# Patient Record
Sex: Male | Born: 1935
Health system: Southern US, Community
[De-identification: ages and names within clinical notes are randomized; demographics above are authoritative.]

## PROBLEM LIST (undated history)

## (undated) DIAGNOSIS — N35919 Unspecified urethral stricture, male, unspecified site: Secondary | ICD-10-CM

## (undated) DIAGNOSIS — Z8673 Personal history of transient ischemic attack (TIA), and cerebral infarction without residual deficits: Secondary | ICD-10-CM

## (undated) DIAGNOSIS — I499 Cardiac arrhythmia, unspecified: Secondary | ICD-10-CM

## (undated) DIAGNOSIS — E785 Hyperlipidemia, unspecified: Secondary | ICD-10-CM

## (undated) DIAGNOSIS — F419 Anxiety disorder, unspecified: Secondary | ICD-10-CM

## (undated) DIAGNOSIS — Z8546 Personal history of malignant neoplasm of prostate: Secondary | ICD-10-CM

## (undated) DIAGNOSIS — Z85828 Personal history of other malignant neoplasm of skin: Secondary | ICD-10-CM

## (undated) DIAGNOSIS — M1990 Unspecified osteoarthritis, unspecified site: Secondary | ICD-10-CM

## (undated) DIAGNOSIS — N320 Bladder-neck obstruction: Secondary | ICD-10-CM

## (undated) DIAGNOSIS — I10 Essential (primary) hypertension: Secondary | ICD-10-CM

## (undated) DIAGNOSIS — H3530 Unspecified macular degeneration: Secondary | ICD-10-CM

## (undated) DIAGNOSIS — Z8739 Personal history of other diseases of the musculoskeletal system and connective tissue: Secondary | ICD-10-CM

## (undated) DIAGNOSIS — I4891 Unspecified atrial fibrillation: Secondary | ICD-10-CM

## (undated) DIAGNOSIS — R310 Gross hematuria: Secondary | ICD-10-CM

## (undated) DIAGNOSIS — R339 Retention of urine, unspecified: Secondary | ICD-10-CM

## (undated) HISTORY — DX: Unspecified atrial fibrillation: I48.91

## (undated) HISTORY — PX: TONSILLECTOMY: SUR1361

## (undated) HISTORY — PX: CATARACT EXTRACTION W/ INTRAOCULAR LENS  IMPLANT, BILATERAL: SHX1307

## (undated) HISTORY — PX: APPENDECTOMY: SHX54

---

## 1975-07-25 HISTORY — PX: CHOLECYSTECTOMY: SHX55

## 1996-07-24 HISTORY — PX: OTHER SURGICAL HISTORY: SHX169

## 2004-11-23 ENCOUNTER — Inpatient Hospital Stay (HOSPITAL_COMMUNITY): Admission: RE | Admit: 2004-11-23 | Discharge: 2004-11-25 | Payer: Self-pay | Admitting: Orthopedic Surgery

## 2004-11-23 HISTORY — PX: TOTAL KNEE ARTHROPLASTY: SHX125

## 2004-11-25 ENCOUNTER — Inpatient Hospital Stay (HOSPITAL_COMMUNITY)
Admission: RE | Admit: 2004-11-25 | Discharge: 2004-12-06 | Payer: Self-pay | Admitting: Physical Medicine & Rehabilitation

## 2004-11-25 ENCOUNTER — Ambulatory Visit: Payer: Self-pay | Admitting: Physical Medicine & Rehabilitation

## 2005-12-14 ENCOUNTER — Encounter: Admission: RE | Admit: 2005-12-14 | Discharge: 2005-12-14 | Payer: Self-pay | Admitting: Internal Medicine

## 2007-01-08 ENCOUNTER — Encounter: Admission: RE | Admit: 2007-01-08 | Discharge: 2007-01-08 | Payer: Self-pay | Admitting: Neurosurgery

## 2010-12-09 NOTE — Consult Note (Signed)
NAMEBARLOW, HARRISON               ACCOUNT NO.:  [id number]   MEDICAL RECORD NO.:  [id number]          PATIENT TYPE:  IPS   LOCATION:  4002                         FACILITY:  [facility]   PHYSICIAN:  James L. Malon Kindle., M.D.DATE OF BIRTH:  [birth year]   DATE OF CONSULTATION:  DATE OF DISCHARGE:                                   CONSULTATION   REFERRING PHYSICIAN:  Ellwood Dense, M.D.   REASON FOR CONSULTATION:  Dilated colon.   HISTORY OF PRESENT ILLNESS:  A very nice 77 year old gentleman, father of  Dr. Chipper Herb in Radiation Oncology, who was admitted and underwent  total right knee arthroplasty on Nov 24, 2004.  At the time of his admission  he had a potassium of around 3.0.  He got some additional potassium up to  3.7 on Nov 26, 2004 and it is down to 2.6 today despite some additional oral  potassium.  We were asked to see him regarding dilated colon and abdominal  bloating and discomfort.  No severe pain, fever, etc.  This was noted  yesterday, was no better today after one day of potassium replacement.  The  patient notes no history of previous problems with potassium.  Currently he  is receiving oral potassium and IV fluids without potassium.  His narcotics  have bene hold.  He is receiving an ice pack to his knee.   ADMISSION MEDICATIONS:  Allopurinol.  Lipitor.  Propranolol.  Hydrochlorothiazide.  Eye drops.   ALLERGIES:  No known drug allergies.   PAST MEDICAL HISTORY:  Hypertension.  He has macular degeneration, this is  one of his main problems.  He has a history of  high cholesterol and history  of syncope.  No known heart disease.  He had a spontaneous pneumothorax in  1954.  He also has had prostate cancer and has undergone seed implants.   PAST SURGICAL HISTORY:  Right total knee replacement.  Cholecystectomy.  Tonsillectomy.  Prostate seed implants.  Several MOHS procedures for  squamous cell carcinoma of the skin.   FAMILY HISTORY:  Mother died of heart  disease due to rheumatic fever at 62.  Father died of a stroke in the 75s.  There is no family history of any GI  cancer.   SOCIAL HISTORY:  He is retired from the [employer].  He is a nonsmoker.  Drinks wine daily.  Has five children.  Lives here in town here with his  wife.   REVIEW OF SYSTEMS:  Dr. Earl Gala his primary care doctor.  Checks stool  hemoccult yearly which have been negative.  He also has regular sigmoids  every 3-5 years.  He thinks he had a last one about three years ago.  He has  occasional constipations.  Only taking something to go 2-3 times in his life  and occasional suppository, this is not a major problem for him.  He has  never seen any blood in his stool.   PHYSICAL EXAMINATION:  GENERAL:  A very pleasant, oriented and alert white  male in no acute distress.  HEENT:  Eyes:  Sclerae nonicteric.  NECK:  Supple.  LUNGS:  Clear.  HEART:  Regular rate and rhythm without murmurs or gallops.  ABDOMEN:  Distended and tympanic but soft and nontender, no rebound or  guarding.  The bowel sounds are present and at times actually sound normal  but clearly present, they are not high-pitched.  RECTAL:  No stool at all in the rectal vault.  The patient had previously  had an enema yesterday.  The mucus was sent for hemoccult.   ASSESSMENT:  Profound colonic ileus probably due to low potassium.  There is  no stool in the rectal vault.  He is not impacted.  He had an enema.  Has  regular sigmoids.  On the KUB it appears that he has air down to at least  the level of the distal sigmoid proximal rectum.  Hopefully this will  improve with steady replacement of his potassium.   PLAN:  Agree with replacing his potassium.  He is getting 40 mEq q.i.d.  orally right now.  Allow him small amounts of clear liquids until he is  doing better.  Will add some potassium to his IV.  In the interim we will  place a rectal tube and hook it up to low suction to see if this will help   decompress his colon some with plans to remove this as soon as he is  decompressed and passing air.      JLE/MEDQ  D:  11/29/2004  T:  11/29/2004  Job:  [id number]   cc:   Ellwood Dense, M.D.  [address] [place]  [place] [address]  Fax: [phone]   Dr. Merrilee Seashore(?)   Theressa Millard, M.D.  [address] [place]  [place] [address]  Fax: [phone]

## 2010-12-09 NOTE — Op Note (Signed)
[name], Potter               ACCOUNT NO.:  [id number]   MEDICAL RECORD NO.:  [id number]          PATIENT TYPE:  INP   LOCATION:  0003                         FACILITY:  [facility]   PHYSICIAN:  Ollen Gross, M.D.    DATE OF BIRTH:  01-10-1926   DATE OF PROCEDURE:  11/23/2004  DATE OF DISCHARGE:                                 OPERATIVE REPORT   PREOPERATIVE DIAGNOSIS:  Osteoarthritis, right knee.   POSTOPERATIVE DIAGNOSES:  Osteoarthritis, right knee.   PROCEDURE:  Right total knee arthroplasty, computer navigated.   SURGEON:  Dr. Despina Hick.   ASSISTANT:  Avel Peace.   ANESTHESIA:  Spinal.   ESTIMATED BLOOD LOSS:  Minimal.   DRAINS:  Hemovac x1.   TOURNIQUET TIME:  61 minutes at 300 mmHg.   COMPLICATIONS:  None.   CONDITION:  Stable to recovery.   BRIEF CLINICAL NOTE:  [name] is a 77 year old male with severe end-stage  arthritis of the right knee with a large varus deformity and significant  pain. He has failed nonoperative management and presents now for total knee  arthroplasty.   PROCEDURE IN DETAIL:  After successful administration of spinal anesthetic,  a tourniquet was placed high on the right thigh and right lower extremity  prepped and draped in usual sterile fashion. Extremity was wrapped in  Esmarch, knee flexed and tourniquet inflated to 300 mmHg. Standard midline  incision was made with 10 blade through the subcutaneous tissue to the level  of the extensor mechanism where a fresh blade was used to make a medial  parapatellar arthrotomy. Soft tissue over the proximal medial tibia  subperiosteally elevated through the joint line with the knife into the  membranous bursa with a Cobb elevator. Soft tissue of the proximal lateral  tibia is also elevated with attention being paid to avoid the patellar  tendon on tibial tubercle. Patella was everted, knee flexed to 90 degrees,  and ACL and PCL removed. The Shantz screws were then placed, two in the  tibia  and two in the femur for placement of the computerized arrays.  Anatomic data is then inputted into the computer and femoral and tibial  models were generated. The alignment demonstrates 9 degrees of varus with a  9-degree flexion contracture.   Knee was again flexed 90 degrees, and the distal femoral cutting guide was  placed. Under computer guidance, we sent it for neutral varus-valgus,  neutral flexion/extension, and to remove 10 mm of the distal femur. Distal  femoral resection is made with an oscillating saw. We confirmed with the  computer that the cup was made as planned. The rotational block is then  placed to reference off the epicondylar axis. Size 5 was the computer  generated size which confirmed with intraoperative measurement. The size 5  cutting block was then placed, and then anterior, posterior, and chamfer  cuts were made.   Tibia was subluxed forward, and the remainder of the menisci are removed.  The tibial cutting block was placed under computer guidance to remove  approximately 10 mm off the nondeficient lateral side. The block is pinned  in 2 degrees of flexion with neutral varus-valgus. The resection was then  made with an oscillating saw and confirmed to be made as planned. Size 4 is  most appropriate tibial component, and the proximal tibia was then prepared  with the modular drill and keel punch for a size 4. Femoral preparation was  completed with the intercondylar cut for the size 5.   Size 4 mobile bearing tibial trial with a size 5 posterior stabilized  femoral trial and the 10-mm posterior stabilized rotating platform insert  trial were placed. With the 10, full extension was achieved with excellent  varus and valgus balance throughout full range of motion. The patella was  then everted and thickness measured to be 26 mm. Freehand resection taken to  14 mm, 41 template was placed, lug holes were drilled, trial patella was  placed, and it tracks normally.  Osteophytes were then removed off the  posterior femur with the trial in place. All trials were removed, and the  cut bone surfaces were prepared with pulsatile lavage. Cement was mixed and  once ready for reimplantation, a size 4 mobile bearing tibial tray, size 5  posterior stabilized femur, and 41 patella are cemented into place, and the  patella was held the clamp. Trial 10-mm inserts were placed and knee held in  full extension, and all extruded cement removed. Once the cement fully  hardened, then the permanent 10-mm posterior stabilized rotating platform  insert is placed in the tibial tray. Wound was copiously irrigated with  saline solution, and the extensor mechanism closed over Hemovac drain with  interrupted #1 PDS. Flexion against gravity was 140 degrees. The tourniquet  had been released with total time of 61 minutes. Subcu was closed with  interrupted 2-0 Vicryl subcuticular running 4-0 Monocryl. Incisions were  cleaned and dried, and Steri-Strips and bulky sterile dressing applied.  Please note that prior to removing the computerized pins with the trials in  place, we had achieved full extension and were less than 1 degree from being  completely neutral in the varus-valgus plane. At completion of procedure, he  was placed into a knee immobilizer, awakened and transferred to recovery in  stable condition.      FA/MEDQ  D:  11/23/2004  T:  11/23/2004  Job:  [id number]

## 2010-12-09 NOTE — Discharge Summary (Signed)
Justin Potter, Justin Potter NO.:  [id number]   MEDICAL RECORD NO.:  [id number]          PATIENT TYPE:  IPS   LOCATION:  4007                         FACILITY:  [facility]   PHYSICIAN:  Ellwood Dense, M.D.   DATE OF BIRTH:  1925/12/03   DATE OF ADMISSION:  11/25/2004  DATE OF DISCHARGE:  12/06/2004                                 DISCHARGE SUMMARY   DISCHARGE DIAGNOSES:  1.  Right total knee replacement for osteoarthritis.  2.  Postoperative ileus, resolved.  3.  Hypokalemia, much improved.  4.  Hypertension, stable.   HISTORY OF PRESENT ILLNESS:  Mr. Mcfarren is a 77 year old male with history  of hypertension and right knee pain secondary to end-stage OA.  He elected  to undergo right total knee replacement Nov 23, 2004 by Dr. Lequita Halt.  Postop  he is weightbearing as tolerated and on Coumadin for DVT prophylaxis.  INR  subtherapeutic currently.  Patient also noted to be hypokalemic with  potassium at 3 at admission currently being supplemented with a rise to 3.1.  Therapy is initiated and patient noted to be progressing along slowly  __________ .   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   ALLERGIES:  No known drug allergies.   FAMILY HISTORY:  Positive for coronary artery disease and CVA.   SOCIAL HISTORY:  Patient lives with wife in [place] with one level with two  steps at entry, was independent prior to admission, he drinks two glasses of  wine a day, quit tobacco in 1975.   HOSPITAL COURSE:  Mr. Paco Cislo was admitted to rehab on Nov 25, 2004 for  inpatient therapies to consist of PT/OT daily.  Past admission patient was  continued on Coumadin for DVT prophylaxis, subcu Lovenox was added  until  Coumadin at therapeutic basis.  Patient was maintained on potassium  supplement on b.i.d. basis.  He did report problems with pain control and  OxyContin CR was added to help with more consistent pain relief.  As patient  reported no BM since surgery he was started on Senokot-S at bedtime.  Patient did have some diarrhea over the weekend on May 6, May 7 and stool  was checked for Clostridium differential.  On May 8 patient reported  complaints of continued diarrhea - question constipation versus early ileus.  A KUB was checked showing patient with moderate diffuse colonic ileus.  Patient was also noted to be hypokalemic which was contributing to his  hypokalemia as potassium on May 8 at 2.7.  Patient was started on potassium  supplement and his hydrochlorothiazide was decreased to once a day.  Followup KUB done on May 9 showed continued abdominal distention, mainly  colonic, with air throughout small bowel.  GI was consulted with question of  rectal tube versus NG placement.  Dr. Randa Evens evaluated patient and felt  that  patient's ileus was mostly due to hypokalemia, rectal tube was placed  to low suction on Nov 29, 2004, this was discontinued on Nov 30, 2004 as x-  rays revealed less abdominal distention.  Patient's diet was slowly advanced  to regular on Dec 01, 2004.  Of issue during this stay has been patient's  continued hypokalemia, it did improve slightly to 3 however, on Dec 02, 2004  it was noted to drop down to 2.7.  Patient's hydrochlorothiazide was  discontinued completely and patient was treated with runs of potassium to  help supplement his hypokalemia.  On Dec 06, 2004 patient's potassium was  noted to be improving at 3.3.  As potassium was on upward trend patient was  discharged to home with 40 mEq b.i.d. with repeat potassium level to be  checked on Dec 06, 2004 with the results to Dr. Earl Gala.   By time of discharge patient's diarrhea had resolved.  Patient was able  to  tolerate regular diet without difficulty.  He knee incision was noted to be  healing well without any signs or symptoms of infection.  He had been taken  off narcotics as of Nov 28, 2004 and pain was reasonably controlled with  p.r.n. use of Ultram.  Patient's blood pressure  monitored on b.i.d. basis  has shown good control on Inderal b.i.d. alone.  At time of discharge blood  pressures ranging at 130s-145 systolic's/70s-80s diastolic's.  Check of  lytes on Dec 06, 2004 reveal sodium 141, potassium 3.2, chloride 109, CO2  24, BUN 12, creatinine 0.8, glucose 112.  Patient did make good progress  during his stay in rehab.  By time of discharge he was modified independent  for transfers, modified independent for ambulating greater than 200 feet  with a rolling walker, he was at modified independent level for upper body  care, required setup, with occasionally min assist for lower body dressing.  Further followup therapies to include home health PT and RN by [facility].  On Dec 06, 2004 patient is discharged to home.   DISCHARGE MEDICATIONS:  1.  Inderal 40 mg b.i.d.  2.  Oxycodone IR one p.o. per day p.r.n. severe pain.  3.  Ultram 50 mg q.i.d. p.r.n. pain.  4.  Coumadin 2 mg two p.o. every evening.  5.  K-Dur 40 mEq b.i.d.   DIET:  Regular.   ACTIVITY:  As tolerated, use a walker.   SPECIAL INSTRUCTIONS:  No alcohol, no smoking, no driving, wash incision  with soap and water keep clean and dry.  Home health RN to draw prothrombin  time next on Dec 07, 2004 with results to Dr. Earl Gala.  Coumadin to continue  to June 4.   FOLLOW UP:  Patient to follow up with Dr. Lequita Halt in 2 weeks, follow up with  Dr. Earl Gala for routine check, follow up with Dr. Lamar Benes as needed.      PP/MEDQ  D:  12/07/2004  T:  12/07/2004  Job:  [id number]   cc:   Ollen Gross, M.D.  [facility]  [address]  [address]  [place] [address]  Fax: [phone]   Dr.  Barbra Sarks, M.D.  [address] [place]  [place] [address]  Fax: [phone]

## 2010-12-09 NOTE — H&P (Signed)
NAME:  Justin Potter, Justin Potter          ACCOUNT NO.:  [id number]   MEDICAL RECORD NO.:  [id number]          PATIENT TYPE:  INP   LOCATION:  0477                         FACILITY:  [facility]   PHYSICIAN:  Ollen Gross, M.D.    DATE OF BIRTH:  12/07/1925   DATE OF ADMISSION:  11/23/2004  DATE OF DISCHARGE:  11/25/2004                                HISTORY & PHYSICAL   DATE OF OFFICE VISIT AND HISTORY AND PHYSICAL:  November 15, 2004   CHIEF COMPLAINT:  Right knee pain.   HISTORY OF PRESENT ILLNESS:  Patient is a 77 year old male seen by Dr. Homero Fellers  Aluisio for ongoing right knee pain.  He is the father of Dr. Chipper Herb  in radiation oncology.  He was seen for progressive right knee pain, it has  been ongoing for about 6 or 7 years now but getting much worse.  He has seen  Dr. Jerl Santos in the past and recommended holding off on surgery as long as  possible.  He has undergone injections which help temporarily including  Synvisc.  He had a friend that underwent unicompartmental replacement and  was interested in that but unfortunately his symptoms have progressed and  his x-rays show that he has moderate change of 8- to 10-degree varus  deformity on the right with bone-on-bone change in the medial compartment  and also patellofemoral changes and it is felt he has reached a point where  he can benefit from undergoing a total knee replacement.  Risks and benefits  have been discussed and he has elected to proceed with surgery.   ALLERGIES:  No known drug allergies.   CURRENT MEDICATIONS:  Allopurinol, Lipitor, propranolol/hydrochlorothiazide.   PAST MEDICAL HISTORY:  Hypertension, history of syncopal episode 2001,  macular degeneration, spontaneous pneumothorax 1954, elevated cholesterol,  prostate cancer, degenerative arthritis.   PAST SURGICAL HISTORY:  Tonsillectomy, cholecystectomy, prostate seed  implantation, Mohs surgery on ear and lip, multiple dermatologic procedures  of excisions  of squamous cell carcinoma, he has also undergone multiple  treatments for macular degeneration.   SOCIAL HISTORY:  Married, retired from the [employer], nonsmoker, one to  two glasses of wine daily, has five children, his wife will be assisting  with care after surgery.   FAMILY HISTORY:  Mother deceased heart disease, father deceased history of  stroke.   REVIEW OF SYSTEMS:  GENERAL:  No fever, chills, night sweats.  NEUROLOGIC:  Syncopal episode back in 2001, no subsequent problems, no recent syncope,  seizures or paralysis.  RESPIRATORY:  No shortness of breath, productive  cough, or hemoptysis.  CARDIOVASCULAR:  No chest pain, angina, orthopnea.  GI:  No nausea, vomiting, diarrhea, constipation.  GU:  Has somewhat of a  weak stream otherwise no dysuria, hematuria or discharge.  MUSCULOSKELETAL:  Right knee found in the history of present illness.   PHYSICAL EXAMINATION:  VITAL SIGNS:  Pulse 56, respirations 12, blood  pressure 148/82.  GENERAL:  [age]-year-old white male well nourished, well developed,  tall frame, he is alert, oriented, cooperative, pleasant at time of exam,  appears to be an excellent historian.  HEENT:  Normocephalic, atraumatic.  Pupils round and reactive.  EOMs are  intact.  He has had lip reconstructive surgery.  Patient does have upper  dentures.  NECK:  Supple.  No carotid bruits.  CHEST:  Clear anterior posterior chest walls.  No rhonchi, rales, or  wheezing.  HEART:  Regular rate and rhythm with an early systolic ejection murmur 2/6  best heard at the aortic point.  ABDOMEN:  Soft, slightly round, nontender, bowel sounds are present.  RECTAL/BREASTS/GENITALIA:  Not done not pertinent to present illness.  EXTREMITIES/RIGHT KNEE:  Right knee does show a significant varus deformity  with range of motion 5-115 degrees, marked crepitus is noted, no effusion,  no instability.   IMPRESSION:  1.  Osteoarthritis right knee.  2.  Hypertension.   3.  History of prostate cancer.  4.  Syncopal episode 2001.  5.  Hypercholesterolemia.  6.  History of spontaneous pneumothorax 1954.   PLAN:  Patient admitted to [facility] to undergo a right total  knee arthroplasty.  Surgery will be performed by Dr. Ollen Gross.      ALP/MEDQ  D:  11/27/2004  T:  11/27/2004  Job:  [id number]   cc:   Theressa Millard, M.D.  [address] [place]  [place] [address]  Fax: [phone]

## 2011-07-26 DIAGNOSIS — H35359 Cystoid macular degeneration, unspecified eye: Secondary | ICD-10-CM | POA: Diagnosis not present

## 2011-07-26 DIAGNOSIS — H353190 Nonexudative age-related macular degeneration, unspecified eye, stage unspecified: Secondary | ICD-10-CM | POA: Diagnosis not present

## 2011-07-26 DIAGNOSIS — H353290 Exudative age-related macular degeneration, unspecified eye, stage unspecified: Secondary | ICD-10-CM | POA: Diagnosis not present

## 2011-08-09 DIAGNOSIS — E7800 Pure hypercholesterolemia, unspecified: Secondary | ICD-10-CM | POA: Diagnosis not present

## 2011-08-09 DIAGNOSIS — R42 Dizziness and giddiness: Secondary | ICD-10-CM | POA: Diagnosis not present

## 2011-08-09 DIAGNOSIS — Z1331 Encounter for screening for depression: Secondary | ICD-10-CM | POA: Diagnosis not present

## 2011-08-09 DIAGNOSIS — I10 Essential (primary) hypertension: Secondary | ICD-10-CM | POA: Diagnosis not present

## 2011-08-30 DIAGNOSIS — H35059 Retinal neovascularization, unspecified, unspecified eye: Secondary | ICD-10-CM | POA: Diagnosis not present

## 2011-08-30 DIAGNOSIS — H353290 Exudative age-related macular degeneration, unspecified eye, stage unspecified: Secondary | ICD-10-CM | POA: Diagnosis not present

## 2011-11-01 DIAGNOSIS — L821 Other seborrheic keratosis: Secondary | ICD-10-CM | POA: Diagnosis not present

## 2011-11-01 DIAGNOSIS — C44711 Basal cell carcinoma of skin of unspecified lower limb, including hip: Secondary | ICD-10-CM | POA: Diagnosis not present

## 2011-11-01 DIAGNOSIS — D044 Carcinoma in situ of skin of scalp and neck: Secondary | ICD-10-CM | POA: Diagnosis not present

## 2011-11-01 DIAGNOSIS — D0439 Carcinoma in situ of skin of other parts of face: Secondary | ICD-10-CM | POA: Diagnosis not present

## 2011-11-01 DIAGNOSIS — L570 Actinic keratosis: Secondary | ICD-10-CM | POA: Diagnosis not present

## 2011-11-01 DIAGNOSIS — D0470 Carcinoma in situ of skin of unspecified lower limb, including hip: Secondary | ICD-10-CM | POA: Diagnosis not present

## 2011-11-01 DIAGNOSIS — C4442 Squamous cell carcinoma of skin of scalp and neck: Secondary | ICD-10-CM | POA: Diagnosis not present

## 2011-11-01 DIAGNOSIS — C44320 Squamous cell carcinoma of skin of unspecified parts of face: Secondary | ICD-10-CM | POA: Diagnosis not present

## 2011-11-01 DIAGNOSIS — D0430 Carcinoma in situ of skin of unspecified part of face: Secondary | ICD-10-CM | POA: Diagnosis not present

## 2011-11-01 DIAGNOSIS — C44519 Basal cell carcinoma of skin of other part of trunk: Secondary | ICD-10-CM | POA: Diagnosis not present

## 2011-11-02 DIAGNOSIS — H35359 Cystoid macular degeneration, unspecified eye: Secondary | ICD-10-CM | POA: Diagnosis not present

## 2011-11-02 DIAGNOSIS — H35059 Retinal neovascularization, unspecified, unspecified eye: Secondary | ICD-10-CM | POA: Diagnosis not present

## 2011-11-02 DIAGNOSIS — H353290 Exudative age-related macular degeneration, unspecified eye, stage unspecified: Secondary | ICD-10-CM | POA: Diagnosis not present

## 2011-12-01 DIAGNOSIS — Z961 Presence of intraocular lens: Secondary | ICD-10-CM | POA: Diagnosis not present

## 2011-12-01 DIAGNOSIS — H3530 Unspecified macular degeneration: Secondary | ICD-10-CM | POA: Diagnosis not present

## 2011-12-01 DIAGNOSIS — H52209 Unspecified astigmatism, unspecified eye: Secondary | ICD-10-CM | POA: Diagnosis not present

## 2011-12-25 DIAGNOSIS — R05 Cough: Secondary | ICD-10-CM | POA: Diagnosis not present

## 2011-12-25 DIAGNOSIS — R059 Cough, unspecified: Secondary | ICD-10-CM | POA: Diagnosis not present

## 2012-01-08 DIAGNOSIS — H35059 Retinal neovascularization, unspecified, unspecified eye: Secondary | ICD-10-CM | POA: Diagnosis not present

## 2012-01-08 DIAGNOSIS — H353290 Exudative age-related macular degeneration, unspecified eye, stage unspecified: Secondary | ICD-10-CM | POA: Diagnosis not present

## 2012-02-06 DIAGNOSIS — E7800 Pure hypercholesterolemia, unspecified: Secondary | ICD-10-CM | POA: Diagnosis not present

## 2012-02-06 DIAGNOSIS — I10 Essential (primary) hypertension: Secondary | ICD-10-CM | POA: Diagnosis not present

## 2012-03-20 DIAGNOSIS — H353290 Exudative age-related macular degeneration, unspecified eye, stage unspecified: Secondary | ICD-10-CM | POA: Diagnosis not present

## 2012-03-20 DIAGNOSIS — H35059 Retinal neovascularization, unspecified, unspecified eye: Secondary | ICD-10-CM | POA: Diagnosis not present

## 2012-04-23 DIAGNOSIS — Z23 Encounter for immunization: Secondary | ICD-10-CM | POA: Diagnosis not present

## 2012-05-01 DIAGNOSIS — L821 Other seborrheic keratosis: Secondary | ICD-10-CM | POA: Diagnosis not present

## 2012-05-01 DIAGNOSIS — Z85820 Personal history of malignant melanoma of skin: Secondary | ICD-10-CM | POA: Diagnosis not present

## 2012-05-01 DIAGNOSIS — L723 Sebaceous cyst: Secondary | ICD-10-CM | POA: Diagnosis not present

## 2012-05-01 DIAGNOSIS — D485 Neoplasm of uncertain behavior of skin: Secondary | ICD-10-CM | POA: Diagnosis not present

## 2012-05-01 DIAGNOSIS — L570 Actinic keratosis: Secondary | ICD-10-CM | POA: Diagnosis not present

## 2012-05-20 DIAGNOSIS — H353290 Exudative age-related macular degeneration, unspecified eye, stage unspecified: Secondary | ICD-10-CM | POA: Diagnosis not present

## 2012-05-23 DIAGNOSIS — N320 Bladder-neck obstruction: Secondary | ICD-10-CM | POA: Diagnosis not present

## 2012-05-23 DIAGNOSIS — N3942 Incontinence without sensory awareness: Secondary | ICD-10-CM | POA: Diagnosis not present

## 2012-05-23 DIAGNOSIS — Z8546 Personal history of malignant neoplasm of prostate: Secondary | ICD-10-CM | POA: Diagnosis not present

## 2012-07-22 DIAGNOSIS — H353290 Exudative age-related macular degeneration, unspecified eye, stage unspecified: Secondary | ICD-10-CM | POA: Diagnosis not present

## 2012-07-22 DIAGNOSIS — H35059 Retinal neovascularization, unspecified, unspecified eye: Secondary | ICD-10-CM | POA: Diagnosis not present

## 2012-09-02 DIAGNOSIS — H35059 Retinal neovascularization, unspecified, unspecified eye: Secondary | ICD-10-CM | POA: Diagnosis not present

## 2012-09-02 DIAGNOSIS — H353290 Exudative age-related macular degeneration, unspecified eye, stage unspecified: Secondary | ICD-10-CM | POA: Diagnosis not present

## 2012-09-03 ENCOUNTER — Other Ambulatory Visit: Payer: Self-pay | Admitting: Internal Medicine

## 2012-09-03 DIAGNOSIS — C61 Malignant neoplasm of prostate: Secondary | ICD-10-CM | POA: Diagnosis not present

## 2012-09-03 DIAGNOSIS — R350 Frequency of micturition: Secondary | ICD-10-CM | POA: Diagnosis not present

## 2012-09-03 DIAGNOSIS — E7800 Pure hypercholesterolemia, unspecified: Secondary | ICD-10-CM | POA: Diagnosis not present

## 2012-09-03 DIAGNOSIS — Z0000 Encounter for general adult medical examination without abnormal findings: Secondary | ICD-10-CM | POA: Diagnosis not present

## 2012-09-03 DIAGNOSIS — I10 Essential (primary) hypertension: Secondary | ICD-10-CM | POA: Diagnosis not present

## 2012-09-03 DIAGNOSIS — G459 Transient cerebral ischemic attack, unspecified: Secondary | ICD-10-CM

## 2012-09-03 DIAGNOSIS — Z1331 Encounter for screening for depression: Secondary | ICD-10-CM | POA: Diagnosis not present

## 2012-09-03 DIAGNOSIS — M109 Gout, unspecified: Secondary | ICD-10-CM | POA: Diagnosis not present

## 2012-09-09 ENCOUNTER — Ambulatory Visit
Admission: RE | Admit: 2012-09-09 | Discharge: 2012-09-09 | Disposition: A | Discharge status: Home / Self Care | Payer: Medicare Other | Source: Ambulatory Visit | Attending: Internal Medicine | Admitting: Internal Medicine

## 2012-09-09 DIAGNOSIS — G459 Transient cerebral ischemic attack, unspecified: Secondary | ICD-10-CM | POA: Diagnosis not present

## 2012-10-16 DIAGNOSIS — H353290 Exudative age-related macular degeneration, unspecified eye, stage unspecified: Secondary | ICD-10-CM | POA: Diagnosis not present

## 2012-10-16 DIAGNOSIS — H35059 Retinal neovascularization, unspecified, unspecified eye: Secondary | ICD-10-CM | POA: Diagnosis not present

## 2012-10-16 DIAGNOSIS — H35359 Cystoid macular degeneration, unspecified eye: Secondary | ICD-10-CM | POA: Diagnosis not present

## 2012-10-25 DIAGNOSIS — G459 Transient cerebral ischemic attack, unspecified: Secondary | ICD-10-CM | POA: Diagnosis not present

## 2012-10-30 DIAGNOSIS — Z85820 Personal history of malignant melanoma of skin: Secondary | ICD-10-CM | POA: Diagnosis not present

## 2012-10-30 DIAGNOSIS — L570 Actinic keratosis: Secondary | ICD-10-CM | POA: Diagnosis not present

## 2012-10-30 DIAGNOSIS — D0470 Carcinoma in situ of skin of unspecified lower limb, including hip: Secondary | ICD-10-CM | POA: Diagnosis not present

## 2012-10-30 DIAGNOSIS — L84 Corns and callosities: Secondary | ICD-10-CM | POA: Diagnosis not present

## 2012-10-30 DIAGNOSIS — C4441 Basal cell carcinoma of skin of scalp and neck: Secondary | ICD-10-CM | POA: Diagnosis not present

## 2012-10-30 DIAGNOSIS — C44319 Basal cell carcinoma of skin of other parts of face: Secondary | ICD-10-CM | POA: Diagnosis not present

## 2012-10-30 DIAGNOSIS — D485 Neoplasm of uncertain behavior of skin: Secondary | ICD-10-CM | POA: Diagnosis not present

## 2012-11-25 DIAGNOSIS — H353290 Exudative age-related macular degeneration, unspecified eye, stage unspecified: Secondary | ICD-10-CM | POA: Diagnosis not present

## 2012-11-25 DIAGNOSIS — H35359 Cystoid macular degeneration, unspecified eye: Secondary | ICD-10-CM | POA: Diagnosis not present

## 2012-12-02 DIAGNOSIS — T148XXA Other injury of unspecified body region, initial encounter: Secondary | ICD-10-CM | POA: Diagnosis not present

## 2012-12-06 DIAGNOSIS — H3530 Unspecified macular degeneration: Secondary | ICD-10-CM | POA: Diagnosis not present

## 2012-12-06 DIAGNOSIS — H52209 Unspecified astigmatism, unspecified eye: Secondary | ICD-10-CM | POA: Diagnosis not present

## 2012-12-09 DIAGNOSIS — M25519 Pain in unspecified shoulder: Secondary | ICD-10-CM | POA: Diagnosis not present

## 2012-12-09 DIAGNOSIS — M542 Cervicalgia: Secondary | ICD-10-CM | POA: Diagnosis not present

## 2012-12-25 ENCOUNTER — Encounter (HOSPITAL_COMMUNITY): Payer: Self-pay | Admitting: Emergency Medicine

## 2012-12-25 ENCOUNTER — Emergency Department (HOSPITAL_COMMUNITY)
Admission: EM | Admit: 2012-12-25 | Discharge: 2012-12-25 | Disposition: A | Discharge status: Home / Self Care | Payer: Medicare Other | Attending: Emergency Medicine | Admitting: Emergency Medicine

## 2012-12-25 DIAGNOSIS — N35919 Unspecified urethral stricture, male, unspecified site: Secondary | ICD-10-CM | POA: Diagnosis not present

## 2012-12-25 DIAGNOSIS — R319 Hematuria, unspecified: Secondary | ICD-10-CM | POA: Diagnosis not present

## 2012-12-25 DIAGNOSIS — Z8546 Personal history of malignant neoplasm of prostate: Secondary | ICD-10-CM | POA: Diagnosis not present

## 2012-12-25 DIAGNOSIS — R339 Retention of urine, unspecified: Secondary | ICD-10-CM | POA: Insufficient documentation

## 2012-12-25 DIAGNOSIS — Z79899 Other long term (current) drug therapy: Secondary | ICD-10-CM | POA: Diagnosis not present

## 2012-12-25 DIAGNOSIS — R310 Gross hematuria: Secondary | ICD-10-CM | POA: Diagnosis not present

## 2012-12-25 LAB — URINALYSIS, ROUTINE W REFLEX MICROSCOPIC
Ketones, ur: NEGATIVE mg/dL
Nitrite: NEGATIVE
Protein, ur: 100 mg/dL — AB

## 2012-12-25 LAB — BASIC METABOLIC PANEL
BUN: 18 mg/dL (ref 6–23)
CO2: 25 mEq/L (ref 19–32)
Calcium: 9 mg/dL (ref 8.4–10.5)
Chloride: 103 mEq/L (ref 96–112)
Creatinine, Ser: 1 mg/dL (ref 0.50–1.35)
GFR calc non Af Amer: 67 mL/min — ABNORMAL LOW (ref >90)
Glucose, Bld: 111 mg/dL — ABNORMAL HIGH (ref 70–99)
Potassium: 3.7 mEq/L (ref 3.5–5.1)

## 2012-12-25 LAB — CBC WITH DIFFERENTIAL/PLATELET
Basophils Absolute: 0.1 10*3/uL (ref 0.0–0.1)
Basophils Relative: 1 % (ref 0–1)
Eosinophils Absolute: 0.2 10*3/uL (ref 0.0–0.7)
Lymphocytes Relative: 17 % (ref 12–46)
Lymphs Abs: 1.6 10*3/uL (ref 0.7–4.0)
MCH: 30.8 pg (ref 26.0–34.0)
MCV: 88.5 fL (ref 78.0–100.0)
Monocytes Absolute: 1 10*3/uL (ref 0.1–1.0)
Monocytes Relative: 11 % (ref 3–12)
Platelets: 211 10*3/uL (ref 150–400)
WBC: 9.5 10*3/uL (ref 4.0–10.5)

## 2012-12-25 LAB — URINE MICROSCOPIC-ADD ON

## 2012-12-25 MED ORDER — LIDOCAINE HCL 1 % IJ SOLN
INTRAMUSCULAR | Status: AC
  Filled 2012-12-25: qty 20

## 2012-12-25 MED ORDER — CEFTRIAXONE SODIUM 1 G IJ SOLR
500.0000 mg | Freq: Once | INTRAMUSCULAR | Status: AC
  Administered 2012-12-25: 500 mg via INTRAMUSCULAR
  Filled 2012-12-25: qty 10

## 2012-12-25 MED ORDER — DEXTROSE 5 % IV SOLN
500.0000 mg | Freq: Once | INTRAVENOUS | Status: DC
  Filled 2012-12-25: qty 500

## 2012-12-25 MED ORDER — LIDOCAINE HCL 2 % EX GEL
CUTANEOUS | Status: AC
  Filled 2012-12-25: qty 10

## 2012-12-25 NOTE — ED Provider Notes (Signed)
History    CSN: [id number] Arrival date & time 12/25/12  2103  First MD Initiated Contact with Patient 12/25/12 2104      Chief Complaint  Patient presents with  . Hematuria  . Urinary Retention    HPI Patient presents emergency room with urinary retention.  Patient has a remote history of prostate CA with prostate radiation seed insertion many years ago. Since that time he's been asymptomatic. Over the last several days he started noticing hematuria. Today he has not felt like he has been able to urinate at all. Symptoms have progressed throughout the day. He called his urologist and was told to come to the emergency room for a Foley catheter insertion the patient denies any fever abdominal pain nausea vomiting. History reviewed. No pertinent past medical history.  Past Surgical History  Procedure Laterality Date  . Insertion prostate radiation seed      History reviewed. No pertinent family history.  History  Substance Use Topics  . Smoking status: Not on file  . Smokeless tobacco: Not on file  . Alcohol Use: No      Review of Systems  All other systems reviewed and are negative.    Allergies  Review of patient's allergies indicates no known allergies.  Home Medications   Current Outpatient Rx  Name  Route  Sig  Dispense  Refill  . amLODipine (NORVASC) 5 MG tablet   Oral   Take 5 mg by mouth daily.         Marland Kitchen atorvastatin (LIPITOR) 10 MG tablet   Oral   Take 10 mg by mouth daily.         . clopidogrel (PLAVIX) 75 MG tablet   Oral   Take 75 mg by mouth daily.         Marland Kitchen docusate sodium (COLACE) 100 MG capsule   Oral   Take 100 mg by mouth 2 (two) times daily.         . Multiple Vitamins-Minerals (OCUVITE PRESERVISION PO)   Oral   Take 1 tablet by mouth daily.         . nabumetone (RELAFEN) 500 MG tablet   Oral   Take 500 mg by mouth 2 (two) times daily.         . propranolol (INDERAL) 20 MG tablet   Oral   Take 20 mg by mouth 2 (two)  times daily.           BP 166/89  Pulse 51  Temp(Src) 97.5 F (36.4 C)  Resp 15  Ht 6\' 2"  (1.88 m)  Wt 205 lb (92.987 kg)  BMI 26.31 kg/m2  SpO2 98%  Physical Exam  Nursing note and vitals reviewed. Constitutional: No distress.  Elderly  HENT:  Head: Normocephalic and atraumatic.  Right Ear: External ear normal.  Left Ear: External ear normal.  Eyes: Conjunctivae are normal. Right eye exhibits no discharge. Left eye exhibits no discharge. No scleral icterus.  Neck: Neck supple. No tracheal deviation present.  Cardiovascular: Normal rate, regular rhythm and intact distal pulses.   Pulmonary/Chest: Effort normal and breath sounds normal. No stridor. No respiratory distress. He has no wheezes. He has no rales.  Abdominal: Soft. Bowel sounds are normal. He exhibits no distension. There is tenderness (Patient has an urge to urinate with palpation in the suprapubic region). There is no rebound and no guarding.  Musculoskeletal: He exhibits no edema and no tenderness.  Neurological: He is alert. He has normal strength. No sensory  deficit. Cranial nerve deficit:  no gross defecits noted. He exhibits normal muscle tone. He displays no seizure activity.  Skin: Skin is warm and dry. No rash noted. He is not diaphoretic.  Psychiatric: He has a normal mood and affect.    ED Course  Procedures (including critical care time)  Labs Reviewed  BASIC METABOLIC PANEL - Abnormal; Notable for the following:    Glucose, Bld 111 (*)    GFR calc non Af Amer 67 (*)    GFR calc Af Amer 78 (*)    All other components within normal limits  URINALYSIS, ROUTINE W REFLEX MICROSCOPIC - Abnormal; Notable for the following:    Color, Urine RED (*)    APPearance TURBID (*)    Hgb urine dipstick LARGE (*)    Bilirubin Urine MODERATE (*)    Protein, ur 100 (*)    Leukocytes, UA SMALL (*)    All other components within normal limits  CBC WITH DIFFERENTIAL  URINE MICROSCOPIC-ADD ON   No results  found.   1. Urinary retention       MDM  While in the emergency department, the nurses attempted to place a Foley catheter as well as a coud catheter.  Attempts were unsuccessful.  I contacted Dr. Retta Diones who was on call for urology.  He kindly evaluated the patient in the emergency department and placed a Foley catheter after flexible uteroscopy.    The patient was given ceftriaxone 500 mg IM and was provided a prescription for Cipro by Dr Retta Diones.  Pt will be discharged home with the catheter in place.        Celene Kras, MD 12/25/12 [phone]

## 2012-12-25 NOTE — Consult Note (Signed)
Urology Consult  CC: Difficulty urinating, blood in urine  HPI: 79 year old male, patient of Dr. David Grapey's, who is status post brachii therapy for adenocarcinoma prostate approximately 15 years ago. He has had an impressive, durable PSA response. He last saw Dr. Grapey in October, 2013. At that time he was having some symptoms of overactive bladder and was placed on myrbetriq. He stopped taking that recently. Over the past couple of days he's had difficulty urinating, gross hematuria with some dysuria. He feels like he is not emptying appropriately. He came into the emergency room for further evaluation. In the emergency room, catheter placement was tried x4, including a coud catheter. This was unsuccessful, urologic consultation is requested.  PMH: History reviewed. No pertinent past medical history.  PSH: Past Surgical History  Procedure Laterality Date  . Insertion prostate radiation seed      Allergies: No Known Allergies  Medications:  (Not in a hospital admission)   Social History: History   Social History  . Marital Status: Single    Spouse Name: N/A    Number of Children: N/A  . Years of Education: N/A   Occupational History  . Not on file.   Social History Main Topics  . Smoking status: Not on file  . Smokeless tobacco: Not on file  . Alcohol Use: No  . Drug Use: No  . Sexually Active: Not on file   Other Topics Concern  . Not on file   Social History Narrative  . No narrative on file    Family History: History reviewed. No pertinent family history.  Review of Systems: Positive: Gross hematuria, dysuria, frequency, urgency, leakage, feeling of incomplete emptying Negative: .  A further 10 point review of systems was negative except what is listed in the HPI.  Physical Exam: @VITALS2@ General: No acute distress.  Awake. Head:  Normocephalic.  Atraumatic. ENT:  EOMI.  Mucous membranes moist Pulmonary: Equal effort bilaterally.    Abdomen: Soft. Nontender tender to palpation. Slightly protuberant. No masses. Bladder was nonpalpable. Skin:  Normal turgor.  No visible rash. Extremity: No gross deformity of bilateral upper extremities.  No gross deformity of    bilateral lower extremities. Neurologic: Alert. Appropriate mood.  Penis:  circumcised.  No lesions. Urethra: No Foley catheter in place.  Orthotopic meatus. Scrotum: No lesions.  No ecchymosis.  No erythema. Testicles: Descended bilaterally.  No masses bilaterally. Epididymis: Palpable bilaterally. Non Tender to palpation.  Studies:  Recent Labs     12/25/12  2149  HGB  14.4  WBC  9.5  PLT  211    Recent Labs     12/25/12  2149  NA  137  K  3.7  CL  103  CO2  25  BUN  18  CREATININE  1.00  CALCIUM  9.0  GFRNONAA  67*  GFRAA  78*     No results found for this basename: PT, INR, APTT,  in the last 72 hours   No components found with this basename: ABG,   After sterile prep and drape, and administration of 2% viscous lidocaine in the urethra, I attempted to place an 18 French coud-tip catheter. This was unsuccessful. Flexible cystoscopy was then performed. This revealed a bulbous urethral stricture that was quite tight. I navigated a sensor-tip guidewire through this, into what I thought was the bladder. The patient had a sensation in his bladder at this point. I then passed a NephroMax balloon dilator over top of the guidewire,   and what I thought was the bladder neck area. This was done without discomfort to the patient. I then inflated the balloon to 20 atmospheres of pressure for approximately 3 minutes. I then removed the balloon following inflation. Over top of the guidewire, I then placed an 18 French council tip catheter. Approximately 150 cc of bloody urine was obtained. I irrigated the bladder until clear. A small amount of clot came out. The catheter was then hooked to dependent drainage.    Assessment:  1. History of prostate cancer,  status post brachii therapy 15 years ago with no evidence of persistent/recurrent disease  2. Bladder neck/bulbous urethral contracture/stricture, status post dilation  3. Rule out UTI  4. Gross hematuria. This may well be secondary UTI. This needs to be followup  Plan: 1 the patient will be given Rocephin 500 mg IM tonight, and started on Cipro 250 mg twice daily empirically starting tomorrow. Culture has been sent  2. I think he needs to have a catheter for a few days. We will contact him and get him scheduled to come back in the office for a fill/pull/flow  CC: Dr. Osborne    Pager:[phone] 

## 2012-12-25 NOTE — ED Notes (Signed)
[id number]<AV> Expected date:<BR> Expected time:<BR> Means of arrival:<BR> Comments:<BR> Coming from [place] [address]

## 2012-12-25 NOTE — ED Notes (Addendum)
Pt able to void of brown odorless urine w/o catheter.

## 2012-12-25 NOTE — ED Notes (Signed)
Multiple attempts to place foley-used #20 regular and coude, #18 and coude and #16 and unable to proceed around prostate-pt had voided 105 cc's bloody urine prior to insertion-voided with much difficulty.  Lidocaine urojet used prior to cath insertion per protocol-Dr. Roselyn Bering made aware and Dr. Hillis Range paged and will come in.

## 2012-12-25 NOTE — ED Notes (Signed)
Pt states he has had hematuria x3 days and began having urinary retention today.

## 2012-12-25 NOTE — Consult Note (Signed)
Urology Consult  CC: Difficulty urinating, blood in urine  HPI: 79 year old male, patient of Dr. Valetta Fuller, who is status post brachii therapy for adenocarcinoma prostate approximately 15 years ago. He has had an impressive, durable PSA response. He last saw Dr. Isabel Caprice in October, 2013. At that time he was having some symptoms of overactive bladder and was placed on myrbetriq. He stopped taking that recently. Over the past couple of days he's had difficulty urinating, gross hematuria with some dysuria. He feels like he is not emptying appropriately. He came into the emergency room for further evaluation. In the emergency room, catheter placement was tried x4, including a coud catheter. This was unsuccessful, urologic consultation is requested.  PMH: History reviewed. No pertinent past medical history.  PSH: Past Surgical History  Procedure Laterality Date  . Insertion prostate radiation seed      Allergies: No Known Allergies  Medications:  (Not in a hospital admission)   Social History: History   Social History  . Marital Status: Single    Spouse Name: N/A    Number of Children: N/A  . Years of Education: N/A   Occupational History  . Not on file.   Social History Main Topics  . Smoking status: Not on file  . Smokeless tobacco: Not on file  . Alcohol Use: No  . Drug Use: No  . Sexually Active: Not on file   Other Topics Concern  . Not on file   Social History Narrative  . No narrative on file    Family History: History reviewed. No pertinent family history.  Review of Systems: Positive: Gross hematuria, dysuria, frequency, urgency, leakage, feeling of incomplete emptying Negative: .  A further 10 point review of systems was negative except what is listed in the HPI.  Physical Exam: @VITALS2 @ General: No acute distress.  Awake. Head:  Normocephalic.  Atraumatic. ENT:  EOMI.  Mucous membranes moist Pulmonary: Equal effort bilaterally.    Abdomen: Soft. Nontender tender to palpation. Slightly protuberant. No masses. Bladder was nonpalpable. Skin:  Normal turgor.  No visible rash. Extremity: No gross deformity of bilateral upper extremities.  No gross deformity of    bilateral lower extremities. Neurologic: Alert. Appropriate mood.  Penis:  circumcised.  No lesions. Urethra: No Foley catheter in place.  Orthotopic meatus. Scrotum: No lesions.  No ecchymosis.  No erythema. Testicles: Descended bilaterally.  No masses bilaterally. Epididymis: Palpable bilaterally. Non Tender to palpation.  Studies:  Recent Labs     12/25/12  2149  HGB  14.4  WBC  9.5  PLT  211    Recent Labs     12/25/12  2149  NA  137  K  3.7  CL  103  CO2  25  BUN  18  CREATININE  1.00  CALCIUM  9.0  GFRNONAA  67*  GFRAA  78*     No results found for this basename: PT, INR, APTT,  in the last 72 hours   No components found with this basename: ABG,   After sterile prep and drape, and administration of 2% viscous lidocaine in the urethra, I attempted to place an 68 French coud-tip catheter. This was unsuccessful. Flexible cystoscopy was then performed. This revealed a bulbous urethral stricture that was quite tight. I navigated a sensor-tip guidewire through this, into what I thought was the bladder. The patient had a sensation in his bladder at this point. I then passed a NephroMax balloon dilator over top of the guidewire,  and what I thought was the bladder neck area. This was done without discomfort to the patient. I then inflated the balloon to 20 atmospheres of pressure for approximately 3 minutes. I then removed the balloon following inflation. Over top of the guidewire, I then placed an 18 [place] council tip catheter. Approximately 150 cc of bloody urine was obtained. I irrigated the bladder until clear. A small amount of clot came out. The catheter was then hooked to dependent drainage.    Assessment:  1. History of prostate cancer,  status post brachii therapy 15 years ago with no evidence of persistent/recurrent disease  2. Bladder neck/bulbous urethral contracture/stricture, status post dilation  3. Rule out UTI  4. Gross hematuria. This may well be secondary UTI. This needs to be followup  Plan: 1 the patient will be given Rocephin 500 mg IM tonight, and started on Cipro 250 mg twice daily empirically starting tomorrow. Culture has been sent  2. I think he needs to have a catheter for a few days. We will contact him and get him scheduled to come back in the office for a fill/pull/flow  CC: Dr. Earl Gala    Pager:[phone]

## 2012-12-25 NOTE — ED Notes (Signed)
Urology cart to bedside  

## 2012-12-30 DIAGNOSIS — N320 Bladder-neck obstruction: Secondary | ICD-10-CM | POA: Diagnosis not present

## 2012-12-31 DIAGNOSIS — R310 Gross hematuria: Secondary | ICD-10-CM | POA: Diagnosis not present

## 2012-12-31 DIAGNOSIS — N320 Bladder-neck obstruction: Secondary | ICD-10-CM | POA: Diagnosis not present

## 2012-12-31 DIAGNOSIS — Z8546 Personal history of malignant neoplasm of prostate: Secondary | ICD-10-CM | POA: Diagnosis not present

## 2012-12-31 DIAGNOSIS — N35919 Unspecified urethral stricture, male, unspecified site: Secondary | ICD-10-CM | POA: Diagnosis not present

## 2013-01-18 ENCOUNTER — Encounter (HOSPITAL_COMMUNITY): Payer: Self-pay | Admitting: Emergency Medicine

## 2013-01-18 ENCOUNTER — Emergency Department (HOSPITAL_COMMUNITY)
Admission: EM | Admit: 2013-01-18 | Discharge: 2013-01-18 | Disposition: A | Discharge status: Home / Self Care | Payer: Medicare Other | Attending: Emergency Medicine | Admitting: Emergency Medicine

## 2013-01-18 DIAGNOSIS — R300 Dysuria: Secondary | ICD-10-CM | POA: Insufficient documentation

## 2013-01-18 DIAGNOSIS — Z8546 Personal history of malignant neoplasm of prostate: Secondary | ICD-10-CM | POA: Diagnosis not present

## 2013-01-18 DIAGNOSIS — R339 Retention of urine, unspecified: Secondary | ICD-10-CM | POA: Diagnosis not present

## 2013-01-18 DIAGNOSIS — Z7902 Long term (current) use of antithrombotics/antiplatelets: Secondary | ICD-10-CM | POA: Diagnosis not present

## 2013-01-18 DIAGNOSIS — T83091A Other mechanical complication of indwelling urethral catheter, initial encounter: Secondary | ICD-10-CM | POA: Insufficient documentation

## 2013-01-18 DIAGNOSIS — Z79899 Other long term (current) drug therapy: Secondary | ICD-10-CM | POA: Diagnosis not present

## 2013-01-18 DIAGNOSIS — Z466 Encounter for fitting and adjustment of urinary device: Secondary | ICD-10-CM | POA: Diagnosis not present

## 2013-01-18 DIAGNOSIS — Z9889 Other specified postprocedural states: Secondary | ICD-10-CM | POA: Diagnosis not present

## 2013-01-18 DIAGNOSIS — Y846 Urinary catheterization as the cause of abnormal reaction of the patient, or of later complication, without mention of misadventure at the time of the procedure: Secondary | ICD-10-CM | POA: Insufficient documentation

## 2013-01-18 LAB — CBC WITH DIFFERENTIAL/PLATELET
Basophils Absolute: 0 10*3/uL (ref 0.0–0.1)
Basophils Relative: 0 % (ref 0–1)
Hemoglobin: 14.1 g/dL (ref 13.0–17.0)
Lymphocytes Relative: 16 % (ref 12–46)
MCH: 29.7 pg (ref 26.0–34.0)
MCV: 90.5 fL (ref 78.0–100.0)
Neutrophils Relative %: 72 % (ref 43–77)
Platelets: 208 10*3/uL (ref 150–400)
WBC: 8.9 10*3/uL (ref 4.0–10.5)

## 2013-01-18 LAB — URINALYSIS, ROUTINE W REFLEX MICROSCOPIC
Bilirubin Urine: NEGATIVE
Hgb urine dipstick: NEGATIVE
Nitrite: NEGATIVE
pH: 6 (ref 5.0–8.0)

## 2013-01-18 LAB — BASIC METABOLIC PANEL
BUN: 13 mg/dL (ref 6–23)
GFR calc non Af Amer: 81 mL/min — ABNORMAL LOW (ref >90)
Glucose, Bld: 133 mg/dL — ABNORMAL HIGH (ref 70–99)

## 2013-01-18 NOTE — ED Provider Notes (Signed)
History    CSN: [id number] Arrival date & time 01/18/13  0927  First MD Initiated Contact with Patient 01/18/13 [phone]     Chief Complaint  Patient presents with  . urinary obstruction    (Consider location/radiation/quality/duration/timing/severity/associated sxs/prior Treatment) HPI  Patient is a 79 year old male past medical history significant for prostate cancer prostate radiation seed insertion presenting to the emergency department for difficulty urinating again last evening w/ associated suprapubic "fullness" but does not complain of any pain. Patient states prior to last name he is unable to have more of a urinary stream and now states he is only "leaking." Patient does endorse that he has not had a true full good flow since his last catheter replacement earlier this month. Patient states that his son called Dr. Retta Diones this morning regarding this recurrent issue and was advised to return to [facility] ED for catheter replacement. Patient denies any fever, chills, hematuria, new or worsening burning with urination  History reviewed. No pertinent past medical history. Past Surgical History  Procedure Laterality Date  . Insertion prostate radiation seed     No family history on file. History  Substance Use Topics  . Smoking status: Not on file  . Smokeless tobacco: Not on file  . Alcohol Use: No    Review of Systems  Constitutional: Negative for fever and chills.  Respiratory: Negative for shortness of breath.   Cardiovascular: Negative for chest pain.  Gastrointestinal: Negative for nausea, vomiting and abdominal pain.  Genitourinary: Positive for decreased urine volume and difficulty urinating. Negative for hematuria, penile swelling and testicular pain.  Musculoskeletal: Negative for back pain.  All other systems reviewed and are negative.    Allergies  Review of patient's allergies indicates no known allergies.  Home Medications   Current Outpatient Rx  Name  Route   Sig  Dispense  Refill  . amLODipine (NORVASC) 5 MG tablet   Oral   Take 5 mg by mouth every evening.          Marland Kitchen atorvastatin (LIPITOR) 10 MG tablet   Oral   Take 10 mg by mouth every evening.          . clopidogrel (PLAVIX) 75 MG tablet   Oral   Take 75 mg by mouth every morning.          . docusate sodium (COLACE) 100 MG capsule   Oral   Take 100 mg by mouth 2 (two) times daily.         . Multiple Vitamins-Minerals (OCUVITE PRESERVISION PO)   Oral   Take 1 tablet by mouth every morning.          . nabumetone (RELAFEN) 500 MG tablet   Oral   Take 500 mg by mouth 2 (two) times daily.         . propranolol (INDERAL) 20 MG tablet   Oral   Take 20 mg by mouth 2 (two) times daily.          BP 150/91  Pulse 72  Temp(Src) 97.7 F (36.5 C) (Oral)  Resp 20  SpO2 99% Physical Exam  Constitutional: He is oriented to person, place, and time. He appears well-developed and well-nourished. No distress.  HENT:  Head: Normocephalic and atraumatic.  Eyes: Conjunctivae are normal.  Neck: Neck supple.  Cardiovascular: Normal rate, regular rhythm and normal heart sounds.   Pulmonary/Chest: Effort normal and breath sounds normal.  Abdominal: Soft. Bowel sounds are normal. There is tenderness in the suprapubic area.  There is no rigidity, no rebound and no guarding.  Musculoskeletal: He exhibits no edema.  Neurological: He is alert and oriented to person, place, and time.  Skin: Skin is warm and dry. He is not diaphoretic.    ED Course  Procedures (including critical care time) Labs Reviewed  BASIC METABOLIC PANEL - Abnormal; Notable for the following:    Glucose, Bld 133 (*)    GFR calc non Af Amer 81 (*)    All other components within normal limits  URINALYSIS, ROUTINE W REFLEX MICROSCOPIC  CBC WITH DIFFERENTIAL   No results found. 1. Urinary retention   2. Urinary catheter (Foley) change required     MDM  Patient was seen by Dr. Retta Diones in the emergency  department for a catheter replacement. Patient tolerated procedure well. Labs and urine results reviewed. Patient continues to have no pain after procedure. Patient will followup with his urologist as advised by Dr. Retta Diones. Patient case was discussed with Dr. Denton Lank who agrees with my plan. Patient is agreeable to plan. Patient is stable at time of discharge    Jeannetta Ellis, PA-C 01/18/13 1520

## 2013-01-18 NOTE — Consult Note (Signed)
Urology Consult  CC: Difficulty urinating  HPI: 79 year old male with history of prostate cancer and subsequent bladder neck contracture. I saw him about a month ago for problems urinating, he had dilatation of a urethral stricture in the emergency room. He presents at this time with difficulty urinating for about the past 12-16 hours. He has had a dribbling stream recently. He has had no fever or chills.  PMH: History reviewed. No pertinent past medical history.  PSH: Past Surgical History  Procedure Laterality Date  . Insertion prostate radiation seed      Allergies: No Known Allergies  Medications:  (Not in a hospital admission)   Social History: History   Social History  . Marital Status: Single    Spouse Name: N/A    Number of Children: N/A  . Years of Education: N/A   Occupational History  . Not on file.   Social History Main Topics  . Smoking status: Not on file  . Smokeless tobacco: Not on file  . Alcohol Use: No  . Drug Use: No  . Sexually Active: Not on file   Other Topics Concern  . Not on file   Social History Narrative  . No narrative on file    Family History: No family history on file.  Review of Systems: Positive: Slow stream, frequency, urgency, feeling of incomplete emptying, minimal dysuria Negative: None.  A further 10 point review of systems was negative except what is listed in the HPI.  Physical Exam: @VITALS2 @ General: No acute distress.  Awake. Head:  Normocephalic.  Atraumatic. ENT:  EOMI.  Mucous membranes moist Neck:  Supple.  No lymphadenopathy. Pulmonary: Equal effort bilaterally.   Abdomen: Soft.  Non-tender to palpation. Skin:  Normal turgor.  No visible rash. Extremity: No gross deformity of bilateral upper extremities.  No gross deformity of    bilateral lower extremities. Neurologic: Alert. Appropriate mood.  Penis:  Uncircumcised.  No lesions. Urethra: No Foley catheter in place.  Orthotopic meatus. Patient was  leaking urine Scrotum: No lesions.  No ecchymosis.  No erythema. Testicles: Descended bilaterally.  No masses bilaterally. Epididymis: Palpable bilaterally.  Non Tender to palpation.  Studies:  Recent Labs     01/18/13  1055  HGB  14.1  WBC  8.9  PLT  208    Recent Labs     01/18/13  1055  NA  139  K  3.8  CL  103  CO2  24  BUN  13  CREATININE  0.77  CALCIUM  9.0  GFRNONAA  81*  GFRAA  >90     No results found for this basename: PT, INR, APTT,  in the last 72 hours   No components found with this basename: ABG,   After sterile prep and drape, I injected 20 cc of 2% viscous lidocaine the patient's urethra. I negotiated an 79 [place] coud-tip catheter with mild degree of difficulty from some contraction of the bladder neck. Clear urine was obtained. The balloon was filled with 10 cc of water. The catheter was hooked to dependent drainage.  Assessment:  Recurrent bladder neck contracture  Plan: Leave the catheter drainage at this time-the patient will followup with Dr. Isabel Caprice    [phone]

## 2013-01-18 NOTE — ED Notes (Signed)
Pt had urinary strictures before and had to have foley catheter placed to help with urination. Pt started having trouble urinating starting yesterday. Pt has PMH prostate cancer.  Pt was told by his urologist to come to ED for further eval.

## 2013-01-19 NOTE — ED Provider Notes (Signed)
Medical screening examination/treatment/procedure(s) were conducted as a shared visit with non-physician practitioner(s) and myself.  I personally evaluated the patient during the encounter Pt with  Hx prostate ca, c/o urinary retention. Pt/family request urology be called. abd soft nt.   Suzi Roots, MD 01/19/13 [phone]

## 2013-01-20 DIAGNOSIS — H35359 Cystoid macular degeneration, unspecified eye: Secondary | ICD-10-CM | POA: Diagnosis not present

## 2013-01-20 DIAGNOSIS — H353290 Exudative age-related macular degeneration, unspecified eye, stage unspecified: Secondary | ICD-10-CM | POA: Diagnosis not present

## 2013-01-22 DIAGNOSIS — N320 Bladder-neck obstruction: Secondary | ICD-10-CM | POA: Diagnosis not present

## 2013-01-22 DIAGNOSIS — R310 Gross hematuria: Secondary | ICD-10-CM | POA: Diagnosis not present

## 2013-01-22 DIAGNOSIS — N35919 Unspecified urethral stricture, male, unspecified site: Secondary | ICD-10-CM | POA: Diagnosis not present

## 2013-01-23 DIAGNOSIS — N320 Bladder-neck obstruction: Secondary | ICD-10-CM | POA: Diagnosis not present

## 2013-01-23 DIAGNOSIS — N35919 Unspecified urethral stricture, male, unspecified site: Secondary | ICD-10-CM | POA: Diagnosis not present

## 2013-01-27 ENCOUNTER — Other Ambulatory Visit: Payer: Self-pay | Admitting: Urology

## 2013-01-28 ENCOUNTER — Encounter (HOSPITAL_BASED_OUTPATIENT_CLINIC_OR_DEPARTMENT_OTHER): Payer: Self-pay | Admitting: *Deleted

## 2013-01-29 ENCOUNTER — Encounter (HOSPITAL_BASED_OUTPATIENT_CLINIC_OR_DEPARTMENT_OTHER): Payer: Self-pay | Admitting: *Deleted

## 2013-01-29 NOTE — Progress Notes (Signed)
NPO AFTER MN. ARRIVES AT 0700. NEEDS ISTAT AND EKG. WILL TAKE PROPANOLOL AM OF SURG W/ SIP OF WATER.

## 2013-01-31 NOTE — H&P (Signed)
Reason For Visit     Mr. Larch returns today after developing recurrent urinary retention. Approximately one month after he had initially presented to the ER with hematuria and urinary retention he returns with inability to void. A 18 French coud catheter was placed with some difficulty suggested recurrent stricture/bladder neck contracture. I performed cystoscopy on him approximately 3 weeks earlier and found a mild residual bulbar urethral stricture that did not require any manipulation.      His Foley catheter was removed this morning but he is really unable to void. He has had a few clots of blood at the tip of his penis. On ultrasound today, he had about a 300 cc residual. A Coude Foley catheter was placed without much difficulty and about 3-400 cc of dark-colored urine with gross hematuria was obtained . With irrigation a few small clots were obtained. Urine now appears to be a light to moderate pink color.       History of Present Illness          Past Gu Hx:       Mr. Morss  is approximately 15 years status post seed implantation done in 1998 for prostate cancer.  His PSA have essentially been at undetectable levels.  He has had some progressive voiding complaints.  He has noticed some increase in nocturia, frequency, urgency, and what sounds like mild urge incontinence as well as some mild seepage/drainage without awareness. He can occasionally wake up with damp underwear.  Stream is so-so and he does not feel as though he is emptying his bladder completely.  I did check a postvoid residual ultrasound , which was about 2 ounces and certainly quite acceptable.  Vesicare samples last year did not help.Flomax also did not help. Urine clear today. PSA last year was zero.  Several months ago he had an episode that sounded very suspicious for a TIA.  He was put on generic Plavix. He subsequently developed gross hematuria along with some associated dysuria and increased inability to void.   He contacted our office, but I was off that day and he ended up in the emergency room that evening.  The emergency room staff attempted to place a Foley catheter on him numerous occasions, which were unsuccessful.  Dr. Retta Diones came in and was able to place a guide wire and subsequently a balloon, which was used for dilation.  The patient then had a catheter inserted, which was subsequently removed in our office the other day.  He is still having some slight lingering dysuria but his stream has improved.  He finished a course of ciprofloxacin.  Postvoid residual today is negligible.    Past Medical History Problems  1. History of  Arthritis V13.4 2. History of  Hypertension 401.9 3. History of  Macular Degeneration 362.50 4. History of  Prostate Cancer V10.46  Surgical History Problems  1. History of  Cystoscopy For Urethral Stricture 2. History of  Gallbladder Surgery 3. History of  Knee Replacement  Current Meds 1. AmLODIPine Besylate 5 MG Oral Tablet; Therapy: 30Jun2012 to 2. Aspirin 81 MG Oral Tablet; Therapy: (Recorded:29Jul2008) to 3. Atorvastatin Calcium 10 MG Oral Tablet; Therapy: (Recorded:23Oct2012) to 4. Nabumetone 500 MG Oral Tablet; Therapy: 30Jun2012 to 5. Propranolol HCl 40 MG Oral Tablet; Therapy: (Recorded:23Oct2012) to  Allergies Medication  1. No Known Drug Allergies  Social History Problems  1. Alcohol Use 1/2 glass a day 2. Marital History - Currently Married 3. Occupation: retired 4. Tobacco Use V15.82 1 1/2  pk a day for 15 yrs; quit in 1960. Denied  5. Caffeine Use  Review of Systems Genitourinary, constitutional, skin, eye, otolaryngeal, hematologic/lymphatic, cardiovascular, pulmonary, endocrine, musculoskeletal, gastrointestinal, neurological and psychiatric system(s) were reviewed and pertinent findings if present are noted.  Genitourinary: feelings of urinary urgency, dysuria, nocturia, incontinence, weak urinary stream, urinary stream starts and  stops, hematuria and penile pain.  Constitutional: feeling tired (fatigue) and recent 10 lb weight gain.  Eyes: blurred vision.  Musculoskeletal: joint pain, but no bone pain.  Neurological: dizziness.   Physical exam: Well-developed well-nourished male in no acute distress Respiratory: Normal effort Cardiac: Regular rate and rhythm Abdomen: Soft and nontender. Genitourinary: Indwelling Foley catheter draining tea-colored urine. Extremities: No tenderness/edema   Assessment Assessed  1. Gross Hematuria 599.71 2. 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 Urethral Stricture 598.9 4. History of  Prostate Cancer V10.46  Plan Bladder Neck Obstruction (596.0)  1. Follow-up Schedule Surgery Office  Follow-up  Requested for: 02Jul2014 Urethral Stricture (598.9)  2. Follow-up Schedule Surgery Office  Follow-up  Requested for: 02Jul2014  Discussion/Summary   Mr. Roughton has had some fairly longstanding voiding symptoms and I presume he has had an underlying urethral stricture/mild bladder neck contracture for quite some time. He subsequently developed gross hematuria and then urinary retention thought to be secondary to clot retention. Initially catheter insertion was quite difficult. He subsequently underwent cystoscopy and we did not feel that he had really significant ongoing obstruction. He has seemed to void okay initially but then developed recurrent urinary retention. He is now unable to void. Catheter went in fairly easily today, suggesting that some of his problem may be some decreased bladder contractility/voiding dysfunction, rather than a completely obstructive process. I think given the ongoing hematuria, it would be smart to go ahead and hold the Plavix. We are going to plan on bringing him in for outpatient surgery to do a more careful cystoscopy with him asleep and to perform a more hopefully effective balloon dilation of any remaining stricture and/or contracture. We can also gently  resect the bladder neck if there does appear to be any narrowing. We will then hope that Mr. Arizpe can void spontaneously. If note, we will have to consider urodynamics to check on bladder status.

## 2013-02-03 ENCOUNTER — Ambulatory Visit (HOSPITAL_BASED_OUTPATIENT_CLINIC_OR_DEPARTMENT_OTHER)
Admission: RE | Admit: 2013-02-03 | Discharge: 2013-02-03 | Disposition: A | Discharge status: Home / Self Care | Payer: Medicare Other | Source: Ambulatory Visit | Attending: Urology | Admitting: Urology

## 2013-02-03 ENCOUNTER — Encounter (HOSPITAL_BASED_OUTPATIENT_CLINIC_OR_DEPARTMENT_OTHER): Payer: Self-pay | Admitting: Anesthesiology

## 2013-02-03 ENCOUNTER — Encounter (HOSPITAL_BASED_OUTPATIENT_CLINIC_OR_DEPARTMENT_OTHER)
Admission: RE | Disposition: A | Discharge status: Home / Self Care | Payer: Self-pay | Source: Ambulatory Visit | Attending: Urology

## 2013-02-03 DIAGNOSIS — Z923 Personal history of irradiation: Secondary | ICD-10-CM | POA: Diagnosis not present

## 2013-02-03 DIAGNOSIS — Z538 Procedure and treatment not carried out for other reasons: Secondary | ICD-10-CM | POA: Diagnosis not present

## 2013-02-03 DIAGNOSIS — N35919 Unspecified urethral stricture, male, unspecified site: Secondary | ICD-10-CM | POA: Diagnosis not present

## 2013-02-03 DIAGNOSIS — Z8546 Personal history of malignant neoplasm of prostate: Secondary | ICD-10-CM | POA: Insufficient documentation

## 2013-02-03 DIAGNOSIS — H3530 Unspecified macular degeneration: Secondary | ICD-10-CM | POA: Insufficient documentation

## 2013-02-03 DIAGNOSIS — I10 Essential (primary) hypertension: Secondary | ICD-10-CM | POA: Insufficient documentation

## 2013-02-03 DIAGNOSIS — R32 Unspecified urinary incontinence: Secondary | ICD-10-CM | POA: Diagnosis not present

## 2013-02-03 DIAGNOSIS — R310 Gross hematuria: Secondary | ICD-10-CM | POA: Diagnosis not present

## 2013-02-03 DIAGNOSIS — N320 Bladder-neck obstruction: Secondary | ICD-10-CM | POA: Insufficient documentation

## 2013-02-03 DIAGNOSIS — Z87891 Personal history of nicotine dependence: Secondary | ICD-10-CM | POA: Insufficient documentation

## 2013-02-03 DIAGNOSIS — I4891 Unspecified atrial fibrillation: Secondary | ICD-10-CM | POA: Diagnosis not present

## 2013-02-03 DIAGNOSIS — I498 Other specified cardiac arrhythmias: Secondary | ICD-10-CM | POA: Diagnosis not present

## 2013-02-03 DIAGNOSIS — N139 Obstructive and reflux uropathy, unspecified: Secondary | ICD-10-CM | POA: Diagnosis not present

## 2013-02-03 DIAGNOSIS — M129 Arthropathy, unspecified: Secondary | ICD-10-CM | POA: Diagnosis not present

## 2013-02-03 DIAGNOSIS — N489 Disorder of penis, unspecified: Secondary | ICD-10-CM | POA: Insufficient documentation

## 2013-02-03 DIAGNOSIS — Z7901 Long term (current) use of anticoagulants: Secondary | ICD-10-CM | POA: Diagnosis not present

## 2013-02-03 DIAGNOSIS — R339 Retention of urine, unspecified: Secondary | ICD-10-CM | POA: Diagnosis not present

## 2013-02-03 HISTORY — DX: Personal history of other malignant neoplasm of skin: Z85.828

## 2013-02-03 HISTORY — DX: Bladder-neck obstruction: N32.0

## 2013-02-03 HISTORY — DX: Retention of urine, unspecified: R33.9

## 2013-02-03 HISTORY — DX: Personal history of malignant neoplasm of prostate: Z85.46

## 2013-02-03 HISTORY — DX: Unspecified macular degeneration: H35.30

## 2013-02-03 HISTORY — DX: Hyperlipidemia, unspecified: E78.5

## 2013-02-03 HISTORY — DX: Essential (primary) hypertension: I10

## 2013-02-03 HISTORY — DX: Personal history of transient ischemic attack (TIA), and cerebral infarction without residual deficits: Z86.73

## 2013-02-03 HISTORY — DX: Gross hematuria: R31.0

## 2013-02-03 HISTORY — DX: Unspecified urethral stricture, male, unspecified site: N35.919

## 2013-02-03 HISTORY — DX: Personal history of other diseases of the musculoskeletal system and connective tissue: Z87.39

## 2013-02-03 SURGERY — CYSTOSCOPY, WITH URETHRAL DILATION
Anesthesia: General

## 2013-02-03 MED ORDER — DEXTROSE 5 % IV SOLN
1.0000 g | Freq: Once | INTRAVENOUS | Status: DC
  Filled 2013-02-03: qty 10

## 2013-02-03 MED ORDER — STERILE WATER FOR IRRIGATION IR SOLN: Status: DC | PRN

## 2013-02-03 MED ORDER — LACTATED RINGERS IV SOLN
INTRAVENOUS | Status: DC
  Filled 2013-02-03: qty 1000

## 2013-02-03 SURGICAL SUPPLY — 42 items
ADAPTER CATH WHT DISP STRL (CATHETERS) IMPLANT
ADPR CATH MP STRL LF DISP BD (CATHETERS)
BAG DRAIN URO-CYSTO SKYTR STRL (DRAIN) ×1 IMPLANT
BAG DRN ANRFLXCHMBR STRAP LEK (BAG)
BAG DRN UROCATH (DRAIN)
BAG URINE DRAINAGE (UROLOGICAL SUPPLIES) IMPLANT
BAG URINE LEG 19OZ MD ST LTX (BAG) IMPLANT
BALLN NEPHROSTOMY (BALLOONS)
BALLOON NEPHROSTOMY (BALLOONS) ×1 IMPLANT
CANISTER SUCT LVC 12 LTR MEDI- (MISCELLANEOUS) IMPLANT
CATH FOLEY 2WAY SLVR  5CC 16FR (CATHETERS)
CATH FOLEY 2WAY SLVR  5CC 20FR (CATHETERS)
CATH FOLEY 2WAY SLVR  5CC 22FR (CATHETERS)
CATH FOLEY 2WAY SLVR 5CC 16FR (CATHETERS) IMPLANT
CATH FOLEY 2WAY SLVR 5CC 20FR (CATHETERS) IMPLANT
CATH FOLEY 2WAY SLVR 5CC 22FR (CATHETERS) IMPLANT
CLOTH BEACON ORANGE TIMEOUT ST (SAFETY) ×1 IMPLANT
DRAPE CAMERA CLOSED 9X96 (DRAPES) ×1 IMPLANT
ELECT BUTTON BIOP 24F 90D PLAS (MISCELLANEOUS) IMPLANT
ELECT LOOP HF 26F 30D .35MM (CUTTING LOOP) IMPLANT
ELECT NEEDLE 45D HF 24-28F 12D (CUTTING LOOP) IMPLANT
ELECT REM PT RETURN 9FT ADLT (ELECTROSURGICAL)
ELECTRODE REM PT RTRN 9FT ADLT (ELECTROSURGICAL) ×1 IMPLANT
EVACUATOR MICROVAS BLADDER (UROLOGICAL SUPPLIES) IMPLANT
GLOVE BIO SURGEON STRL SZ7.5 (GLOVE) ×1 IMPLANT
GOWN PREVENTION PLUS LG XLONG (DISPOSABLE) ×1 IMPLANT
GOWN STRL REIN XL XLG (GOWN DISPOSABLE) ×1 IMPLANT
GOWN XL W/COTTON TOWEL STD (GOWNS) ×1 IMPLANT
GUIDEWIRE STR DUAL SENSOR (WIRE) IMPLANT
HOLDER FOLEY CATH W/STRAP (MISCELLANEOUS) IMPLANT
KIT ASPIRATION TUBING (SET/KITS/TRAYS/PACK) IMPLANT
LOOP CUTTING 24FR OLYMPUS (CUTTING LOOP) IMPLANT
NDL SAFETY ECLIPSE 18X1.5 (NEEDLE) IMPLANT
NEEDLE HYPO 18GX1.5 SHARP (NEEDLE)
NEEDLE HYPO 22GX1.5 SAFETY (NEEDLE) IMPLANT
NS IRRIG 500ML POUR BTL (IV SOLUTION) IMPLANT
PACK CYSTOSCOPY (CUSTOM PROCEDURE TRAY) ×1 IMPLANT
PLUG CATH AND CAP STER (CATHETERS) IMPLANT
SET ASPIRATION TUBING (TUBING) IMPLANT
SYR 20CC LL (SYRINGE) IMPLANT
SYR 30ML LL (SYRINGE) IMPLANT
WATER STERILE IRR 3000ML UROMA (IV SOLUTION) ×1 IMPLANT

## 2013-02-03 NOTE — Anesthesia Preprocedure Evaluation (Addendum)
Anesthesia Evaluation  Patient identified by MRN, date of birth, ID band Patient awake  General Assessment Comment:1. History of  Arthritis V13.4 2. History of  Hypertension 401.9 3. History of  Macular Degeneration 362.50 4. History of  Prostate Cancer V10.46   Reviewed: Allergy & Precautions, H&P , NPO status , Patient's Chart, lab work & pertinent test results  Airway Mallampati: II TM Distance: >3 FB Neck ROM: Full    Dental no notable dental hx.    Pulmonary neg pulmonary ROS, former smoker,  breath sounds clear to auscultation  Pulmonary exam normal       Cardiovascular Exercise Tolerance: Good hypertension, Pt. on medications and Pt. on home beta blockers negative cardio ROS  + dysrhythmias Rhythm:Regular Rate:Normal  Recent diagnosis of Atrial Fib and bradycardia. Clearance by Dr. Katrinka Blazing. Propranolol stopped, and Eliquis started. Last dose Sunday.   Neuro/Psych TIAnegative neurological ROS  negative psych ROS   GI/Hepatic negative GI ROS, Neg liver ROS,   Endo/Other  negative endocrine ROS  Renal/GU negative Renal ROS  negative genitourinary   Musculoskeletal negative musculoskeletal ROS (+)   Abdominal   Peds negative pediatric ROS (+)  Hematology negative hematology ROS (+)   Anesthesia Other Findings   Reproductive/Obstetrics negative OB ROS                         Anesthesia Physical Anesthesia Plan  ASA: III  Anesthesia Plan: General   Post-op Pain Management:    Induction: Intravenous  Airway Management Planned: LMA  Additional Equipment:   Intra-op Plan:   Post-operative Plan: Extubation in OR  Informed Consent: I have reviewed the patients History and Physical, chart, labs and discussed the procedure including the risks, benefits and alternatives for the proposed anesthesia with the patient or authorized representative who has indicated his/her understanding and  acceptance.   Dental advisory given  Plan Discussed with: CRNA  Anesthesia Plan Comments: (Heart rate 28 and irregular. ECG looks like slow atrial fibrillation (irregular narrow complex with no obvious p waves). Discussed with patient and patient's son Dr. Ballard Russell. The patient feels OK today but he does have occasional lightheadedness. Plan to cancel and reschedule following further evaluation of heart. Called Dr. Earl Gala and the plan is for Mr. Roethler to go home and take it easy and he will have a followup appointment made by the end of the day.)       Anesthesia Quick Evaluation

## 2013-02-03 NOTE — OR Nursing (Signed)
EKG completed showing HR of 28. Dr. Council Mechanic made aware and in to see patient. Dr. Dayton Scrape ( Pt's son ) here and aware. Procedure cancelled. Dr. Allie Dimmer contacted by Dr. Council Mechanic and pt to be seen in office today. EKG faxed to office.

## 2013-02-05 DIAGNOSIS — I10 Essential (primary) hypertension: Secondary | ICD-10-CM | POA: Diagnosis not present

## 2013-02-05 DIAGNOSIS — N139 Obstructive and reflux uropathy, unspecified: Secondary | ICD-10-CM | POA: Diagnosis not present

## 2013-02-05 DIAGNOSIS — I498 Other specified cardiac arrhythmias: Secondary | ICD-10-CM | POA: Diagnosis not present

## 2013-02-05 DIAGNOSIS — Z7901 Long term (current) use of anticoagulants: Secondary | ICD-10-CM | POA: Diagnosis not present

## 2013-02-05 DIAGNOSIS — I4891 Unspecified atrial fibrillation: Secondary | ICD-10-CM | POA: Diagnosis not present

## 2013-02-07 ENCOUNTER — Other Ambulatory Visit: Payer: Self-pay | Admitting: Urology

## 2013-02-07 DIAGNOSIS — I498 Other specified cardiac arrhythmias: Secondary | ICD-10-CM | POA: Diagnosis not present

## 2013-02-07 DIAGNOSIS — N139 Obstructive and reflux uropathy, unspecified: Secondary | ICD-10-CM | POA: Diagnosis not present

## 2013-02-07 DIAGNOSIS — G459 Transient cerebral ischemic attack, unspecified: Secondary | ICD-10-CM | POA: Diagnosis not present

## 2013-02-07 DIAGNOSIS — I10 Essential (primary) hypertension: Secondary | ICD-10-CM | POA: Diagnosis not present

## 2013-02-07 DIAGNOSIS — Z7901 Long term (current) use of anticoagulants: Secondary | ICD-10-CM | POA: Diagnosis not present

## 2013-02-07 DIAGNOSIS — I4891 Unspecified atrial fibrillation: Secondary | ICD-10-CM | POA: Diagnosis not present

## 2013-02-10 ENCOUNTER — Encounter (HOSPITAL_COMMUNITY): Payer: Self-pay | Admitting: Pharmacy Technician

## 2013-02-10 ENCOUNTER — Other Ambulatory Visit (HOSPITAL_COMMUNITY): Payer: Self-pay | Admitting: *Deleted

## 2013-02-10 ENCOUNTER — Encounter (HOSPITAL_COMMUNITY): Payer: Self-pay | Admitting: *Deleted

## 2013-02-12 ENCOUNTER — Encounter (HOSPITAL_COMMUNITY): Payer: Self-pay | Admitting: *Deleted

## 2013-02-12 ENCOUNTER — Ambulatory Visit (HOSPITAL_COMMUNITY)
Admission: RE | Admit: 2013-02-12 | Discharge: 2013-02-12 | Disposition: A | Discharge status: Home / Self Care | Payer: Medicare Other | Source: Ambulatory Visit | Attending: Urology | Admitting: Urology

## 2013-02-12 ENCOUNTER — Other Ambulatory Visit: Payer: Self-pay

## 2013-02-12 ENCOUNTER — Ambulatory Visit (HOSPITAL_COMMUNITY): Payer: Medicare Other | Admitting: Anesthesiology

## 2013-02-12 ENCOUNTER — Encounter (HOSPITAL_COMMUNITY)
Admission: RE | Disposition: A | Discharge status: Home / Self Care | Payer: Self-pay | Source: Ambulatory Visit | Attending: Urology

## 2013-02-12 ENCOUNTER — Ambulatory Visit (HOSPITAL_COMMUNITY): Payer: Medicare Other

## 2013-02-12 ENCOUNTER — Encounter (HOSPITAL_BASED_OUTPATIENT_CLINIC_OR_DEPARTMENT_OTHER): Payer: Self-pay

## 2013-02-12 ENCOUNTER — Ambulatory Visit (HOSPITAL_BASED_OUTPATIENT_CLINIC_OR_DEPARTMENT_OTHER): Admit: 2013-02-12 | Payer: Self-pay | Admitting: Urology

## 2013-02-12 ENCOUNTER — Encounter (HOSPITAL_COMMUNITY): Payer: Self-pay | Admitting: Anesthesiology

## 2013-02-12 DIAGNOSIS — R310 Gross hematuria: Secondary | ICD-10-CM | POA: Diagnosis not present

## 2013-02-12 DIAGNOSIS — Z01818 Encounter for other preprocedural examination: Secondary | ICD-10-CM | POA: Diagnosis not present

## 2013-02-12 DIAGNOSIS — R339 Retention of urine, unspecified: Secondary | ICD-10-CM | POA: Insufficient documentation

## 2013-02-12 DIAGNOSIS — I10 Essential (primary) hypertension: Secondary | ICD-10-CM | POA: Insufficient documentation

## 2013-02-12 DIAGNOSIS — N3289 Other specified disorders of bladder: Secondary | ICD-10-CM | POA: Insufficient documentation

## 2013-02-12 DIAGNOSIS — N35919 Unspecified urethral stricture, male, unspecified site: Secondary | ICD-10-CM | POA: Diagnosis not present

## 2013-02-12 DIAGNOSIS — I4891 Unspecified atrial fibrillation: Secondary | ICD-10-CM | POA: Diagnosis not present

## 2013-02-12 DIAGNOSIS — Z8546 Personal history of malignant neoplasm of prostate: Secondary | ICD-10-CM | POA: Insufficient documentation

## 2013-02-12 HISTORY — DX: Cardiac arrhythmia, unspecified: I49.9

## 2013-02-12 HISTORY — PX: CYSTOSCOPY WITH URETHRAL DILATATION: SHX5125

## 2013-02-12 SURGERY — CYSTOSCOPY, WITH URETHRAL DILATION
Anesthesia: General

## 2013-02-12 SURGERY — CYSTOSCOPY, WITH URETHRAL DILATION
Anesthesia: General | Wound class: Clean Contaminated

## 2013-02-12 MED ORDER — PROPOFOL 10 MG/ML IV BOLUS
INTRAVENOUS | Status: DC | PRN
  Administered 2013-02-12: 50 mg via INTRAVENOUS
  Administered 2013-02-12: 150 mg via INTRAVENOUS
  Administered 2013-02-12: 50 mg via INTRAVENOUS

## 2013-02-12 MED ORDER — STERILE WATER FOR IRRIGATION IR SOLN
Status: DC | PRN
  Administered 2013-02-12: 3000 mL

## 2013-02-12 MED ORDER — DEXAMETHASONE SODIUM PHOSPHATE 10 MG/ML IJ SOLN
INTRAMUSCULAR | Status: DC | PRN
  Administered 2013-02-12: 8 mg via INTRAVENOUS

## 2013-02-12 MED ORDER — MIDAZOLAM HCL 5 MG/5ML IJ SOLN
INTRAMUSCULAR | Status: DC | PRN
  Administered 2013-02-12: .5 mg via INTRAVENOUS

## 2013-02-12 MED ORDER — IOHEXOL 300 MG/ML  SOLN
INTRAMUSCULAR | Status: AC
  Filled 2013-02-12: qty 1

## 2013-02-12 MED ORDER — 0.9 % SODIUM CHLORIDE (POUR BTL) OPTIME
TOPICAL | Status: DC | PRN
  Administered 2013-02-12: 1000 mL

## 2013-02-12 MED ORDER — LIDOCAINE HCL 2 % EX GEL
CUTANEOUS | Status: DC | PRN
  Administered 2013-02-12: 1

## 2013-02-12 MED ORDER — LACTATED RINGERS IV SOLN
INTRAVENOUS | Status: DC
  Administered 2013-02-12: 09:00:00 via INTRAVENOUS
  Administered 2013-02-12: 1000 mL via INTRAVENOUS

## 2013-02-12 MED ORDER — SODIUM CHLORIDE 0.9 % IR SOLN
Status: DC | PRN
  Administered 2013-02-12: 3000 mL

## 2013-02-12 MED ORDER — PHENYLEPHRINE HCL 10 MG/ML IJ SOLN
INTRAMUSCULAR | Status: DC | PRN
  Administered 2013-02-12: 40 ug via INTRAVENOUS
  Administered 2013-02-12: 20 ug via INTRAVENOUS

## 2013-02-12 MED ORDER — EPHEDRINE SULFATE 50 MG/ML IJ SOLN
INTRAMUSCULAR | Status: DC | PRN
  Administered 2013-02-12 (×3): 5 mg via INTRAVENOUS

## 2013-02-12 MED ORDER — LIDOCAINE HCL 2 % EX GEL
CUTANEOUS | Status: AC
  Filled 2013-02-12: qty 10

## 2013-02-12 MED ORDER — FENTANYL CITRATE 0.05 MG/ML IJ SOLN
INTRAMUSCULAR | Status: DC | PRN
  Administered 2013-02-12: 50 ug via INTRAVENOUS

## 2013-02-12 MED ORDER — DEXTROSE 5 % IV SOLN
1.0000 g | Freq: Once | INTRAVENOUS | Status: AC
  Administered 2013-02-12: 1 g via INTRAVENOUS
  Filled 2013-02-12: qty 10

## 2013-02-12 MED ORDER — ONDANSETRON HCL 4 MG/2ML IJ SOLN
INTRAMUSCULAR | Status: DC | PRN
  Administered 2013-02-12 (×2): 2 mg via INTRAVENOUS

## 2013-02-12 MED ORDER — IOHEXOL 300 MG/ML  SOLN
INTRAMUSCULAR | Status: DC | PRN
  Administered 2013-02-12: 10 mL

## 2013-02-12 MED ORDER — FENTANYL CITRATE 0.05 MG/ML IJ SOLN: 25.0000 ug | INTRAMUSCULAR | Status: DC | PRN

## 2013-02-12 MED ORDER — LIDOCAINE HCL (CARDIAC) 20 MG/ML IV SOLN
INTRAVENOUS | Status: DC | PRN
  Administered 2013-02-12: 50 mg via INTRAVENOUS

## 2013-02-12 SURGICAL SUPPLY — 17 items
BAG URINE LEG 500ML (DRAIN) ×1 IMPLANT
BAG URO CATCHER STRL LF (DRAPE) ×2 IMPLANT
BALLN NEPHROSTOMY (BALLOONS) ×2
BALLOON NEPHROSTOMY (BALLOONS) IMPLANT
CATH FOLEY 2WAY SLVR  5CC 18FR (CATHETERS) ×1
CATH FOLEY 2WAY SLVR 5CC 18FR (CATHETERS) IMPLANT
CATH ROBINSON RED A/P 16FR (CATHETERS) IMPLANT
CLOTH BEACON ORANGE TIMEOUT ST (SAFETY) ×2 IMPLANT
DRAPE CAMERA CLOSED 9X96 (DRAPES) ×2 IMPLANT
GLOVE BIOGEL M STRL SZ7.5 (GLOVE) ×2 IMPLANT
GOWN PREVENTION PLUS XLARGE (GOWN DISPOSABLE) ×1 IMPLANT
GOWN STRL NON-REIN LRG LVL3 (GOWN DISPOSABLE) ×1 IMPLANT
GOWN STRL REIN XL XLG (GOWN DISPOSABLE) ×3 IMPLANT
GUIDEWIRE STR DUAL SENSOR (WIRE) ×1 IMPLANT
MANIFOLD NEPTUNE II (INSTRUMENTS) ×2 IMPLANT
PACK CYSTO (CUSTOM PROCEDURE TRAY) ×2 IMPLANT
TUBING CONNECTING 10 (TUBING) ×2 IMPLANT

## 2013-02-12 NOTE — Anesthesia Postprocedure Evaluation (Signed)
  Anesthesia Post-op Note  Patient: Justin Potter  Procedure(s) Performed: Procedure(s) (LRB): CYSTOSCOPY WITH BALLOON DILATATION OF URETHRAL STRICTURE AND BLADDER FULGERATION (N/A)  Patient Location: PACU  Anesthesia Type: General  Level of Consciousness: awake and alert   Airway and Oxygen Therapy: Patient Spontanous Breathing  Post-op Pain: mild  Post-op Assessment: Post-op Vital signs reviewed, Patient's Cardiovascular Status Stable, Respiratory Function Stable, Patent Airway and No signs of Nausea or vomiting  Last Vitals:  Filed Vitals:   02/12/13 0945  BP: 146/73  Pulse: 76  Temp:   Resp: 12    Post-op Vital Signs: stable   Complications: No apparent anesthesia complications

## 2013-02-12 NOTE — Transfer of Care (Signed)
Immediate Anesthesia Transfer of Care Note  Patient: Justin Potter  Procedure(s) Performed: Procedure(s): CYSTOSCOPY WITH BALLOON DILATATION OF URETHRAL STRICTURE AND BLADDER FULGERATION (N/A)  Patient Location: PACU  Anesthesia Type:General  Level of Consciousness: awake, alert , oriented and patient cooperative  Airway & Oxygen Therapy: Patient Spontanous Breathing and Patient connected to face mask oxygen  Post-op Assessment: Report given to PACU RN, Post -op Vital signs reviewed and stable and Patient moving all extremities  Post vital signs: Reviewed and stable  Complications: No apparent anesthesia complications

## 2013-02-12 NOTE — Op Note (Signed)
Preoperative diagnosis: Urinary retention, urethral stricture disease, history of adenocarcinoma the prostate Postoperative diagnosis: Same  Procedure: Cystoscopy, balloon dilation of urethral stricture, fulguration of hemorrhagic mucosa   Surgeon: Valetta Fuller M.D.  Anesthesia: Gen.  Indications: Justin Potter is currently 79 years of age. He has a remote history of prostate cancer treated with seed implantation. PSA has remained closed undetectable. Recently he has had some recurrent gross hematuria. He has not been noted to have more significant pathology. The patient underwent urethral dilation in the emergency room. Since that time he has had catheters in and output is been unsuccessful with multiple voiding trials. He is here now for more definitive evaluation of his urinary tract with repeat dilation of his urethral stricture, assessment of his prosthetic urethra and bladder neck. He was recently noted to be in atrial fibrillation with a slow ventricular response and has undergone additional cardiac evaluation and clearance. He's been recently started on oral antibiotics and has received IV Rocephin.      Technique and findings: Patient was brought to the operating room where successful induction of general anesthesia with LMA airway. He was placed in lithotomy position and prepped and draped in usual manner. Appropriate surgical timeout was performed. Cystoscopy revealed an approximately 1 cm bulbar urethral stricture. I was able to get a guidewire through this area. A balloon dilation to 74 [place] with a fascial dilating balloon was performed. The prostatic urethra itself measured 2-1/2-3 cm. There was no evidence of significant visual obstruction the bladder neck was wide open without evidence of contracture. Several areas of mucosa appeared hemorrhagic consistent with either some radiation cystitis or inflammation from the chronic indwelling catheter. These areas were fulgurated with a Bugbee  electrode. No other pathology was appreciated. A Foley catheter was reinserted and urine was clear. Patient was brought to recovery in stable condition.

## 2013-02-12 NOTE — H&P (View-Only) (Signed)
Reason For Visit     Justin Potter returns today after developing recurrent urinary retention. Approximately one month after he had initially presented to the ER with hematuria and urinary retention he returns with inability to void. A 18 French coud catheter was placed with some difficulty suggested recurrent stricture/bladder neck contracture. I performed cystoscopy on him approximately 3 weeks earlier and found a mild residual bulbar urethral stricture that did not require any manipulation.      His Foley catheter was removed this morning but he is really unable to void. He has had a few clots of blood at the tip of his penis. On ultrasound today, he had about a 300 cc residual. A Coude Foley catheter was placed without much difficulty and about 3-400 cc of dark-colored urine with gross hematuria was obtained . With irrigation a few small clots were obtained. Urine now appears to be a light to moderate pink color.       History of Present Illness          Past Gu Hx:       Justin Potter  is approximately 15 years status post seed implantation done in 1998 for prostate cancer.  His PSA have essentially been at undetectable levels.  He has had some progressive voiding complaints.  He has noticed some increase in nocturia, frequency, urgency, and what sounds like mild urge incontinence as well as some mild seepage/drainage without awareness. He can occasionally wake up with damp underwear.  Stream is so-so and he does not feel as though he is emptying his bladder completely.  I did check a postvoid residual ultrasound , which was about 2 ounces and certainly quite acceptable.  Vesicare samples last year did not help.Flomax also did not help. Urine clear today. PSA last year was zero.  Several months ago he had an episode that sounded very suspicious for a TIA.  He was put on generic Plavix. He subsequently developed gross hematuria along with some associated dysuria and increased inability to void.   He contacted our office, but I was off that day and he ended up in the emergency room that evening.  The emergency room staff attempted to place a Foley catheter on him numerous occasions, which were unsuccessful.  Dr. Dahlstedt came in and was able to place a guide wire and subsequently a balloon, which was used for dilation.  The patient then had a catheter inserted, which was subsequently removed in our office the other day.  He is still having some slight lingering dysuria but his stream has improved.  He finished a course of ciprofloxacin.  Postvoid residual today is negligible.    Past Medical History Problems  1. History of  Arthritis V13.4 2. History of  Hypertension 401.9 3. History of  Macular Degeneration 362.50 4. History of  Prostate Cancer V10.46  Surgical History Problems  1. History of  Cystoscopy For Urethral Stricture 2. History of  Gallbladder Surgery 3. History of  Knee Replacement  Current Meds 1. AmLODIPine Besylate 5 MG Oral Tablet; Therapy: 30Jun2012 to 2. Aspirin 81 MG Oral Tablet; Therapy: (Recorded:29Jul2008) to 3. Atorvastatin Calcium 10 MG Oral Tablet; Therapy: (Recorded:23Oct2012) to 4. Nabumetone 500 MG Oral Tablet; Therapy: 30Jun2012 to 5. Propranolol HCl 40 MG Oral Tablet; Therapy: (Recorded:23Oct2012) to  Allergies Medication  1. No Known Drug Allergies  Social History Problems  1. Alcohol Use 1/2 glass a day 2. Marital History - Currently Married 3. Occupation: retired 4. Tobacco Use V15.82 1 1/2   pk a day for 15 yrs; quit in 1960. Denied  5. Caffeine Use  Review of Systems Genitourinary, constitutional, skin, eye, otolaryngeal, hematologic/lymphatic, cardiovascular, pulmonary, endocrine, musculoskeletal, gastrointestinal, neurological and psychiatric system(s) were reviewed and pertinent findings if present are noted.  Genitourinary: feelings of urinary urgency, dysuria, nocturia, incontinence, weak urinary stream, urinary stream starts and  stops, hematuria and penile pain.  Constitutional: feeling tired (fatigue) and recent 10 lb weight gain.  Eyes: blurred vision.  Musculoskeletal: joint pain, but no bone pain.  Neurological: dizziness.   Physical exam: Well-developed well-nourished male in no acute distress Respiratory: Normal effort Cardiac: Regular rate and rhythm Abdomen: Soft and nontender. Genitourinary: Indwelling Foley catheter draining tea-colored urine. Extremities: No tenderness/edema   Assessment Assessed  1. Gross Hematuria 599.71 2. 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 Urethral Stricture 598.9 4. History of  Prostate Cancer V10.46  Plan Bladder Neck Obstruction (596.0)  1. Follow-up Schedule Surgery Office  Follow-up  Requested for: 02Jul2014 Urethral Stricture (598.9)  2. Follow-up Schedule Surgery Office  Follow-up  Requested for: 02Jul2014  Discussion/Summary   Justin Potter has had some fairly longstanding voiding symptoms and I presume he has had an underlying urethral stricture/mild bladder neck contracture for quite some time. He subsequently developed gross hematuria and then urinary retention thought to be secondary to clot retention. Initially catheter insertion was quite difficult. He subsequently underwent cystoscopy and we did not feel that he had really significant ongoing obstruction. He has seemed to void okay initially but then developed recurrent urinary retention. He is now unable to void. Catheter went in fairly easily today, suggesting that some of his problem may be some decreased bladder contractility/voiding dysfunction, rather than a completely obstructive process. I think given the ongoing hematuria, it would be smart to go ahead and hold the Plavix. We are going to plan on bringing him in for outpatient surgery to do a more careful cystoscopy with him asleep and to perform a more hopefully effective balloon dilation of any remaining stricture and/or contracture. We can also gently  resect the bladder neck if there does appear to be any narrowing. We will then hope that Mr. Lumpkin can void spontaneously. If note, we will have to consider urodynamics to check on bladder status. 

## 2013-02-12 NOTE — Interval H&P Note (Signed)
History and Physical Interval Note:  02/12/2013 8:22 AM  Justin Potter  has presented today for surgery, with the diagnosis of GROSS HEMATURIA URETHRAL STRICTURE   The various methods of treatment have been discussed with the patient and family. After consideration of risks, benefits and other options for treatment, the patient has consented to  Procedure(s): CYSTOSCOPY WITH BALLOON DILATATION OF URETHRAL STRICTURE  (N/A) POSSIBLE TRANSURETHRAL BLADDER NECK CONTRACTURE  (N/A) as a surgical intervention .  The patient's history has been reviewed, patient examined, no change in status, stable for surgery.  I have reviewed the patient's chart and labs.  Questions were answered to the patient's satisfaction.     [name]

## 2013-02-13 ENCOUNTER — Encounter (HOSPITAL_COMMUNITY): Payer: Self-pay | Admitting: Urology

## 2013-02-13 DIAGNOSIS — R339 Retention of urine, unspecified: Secondary | ICD-10-CM | POA: Diagnosis not present

## 2013-02-13 DIAGNOSIS — N35919 Unspecified urethral stricture, male, unspecified site: Secondary | ICD-10-CM | POA: Diagnosis not present

## 2013-02-13 DIAGNOSIS — N320 Bladder-neck obstruction: Secondary | ICD-10-CM | POA: Diagnosis not present

## 2013-02-19 DIAGNOSIS — N390 Urinary tract infection, site not specified: Secondary | ICD-10-CM | POA: Diagnosis not present

## 2013-02-19 DIAGNOSIS — R310 Gross hematuria: Secondary | ICD-10-CM | POA: Diagnosis not present

## 2013-02-19 DIAGNOSIS — R339 Retention of urine, unspecified: Secondary | ICD-10-CM | POA: Diagnosis not present

## 2013-02-25 DIAGNOSIS — I10 Essential (primary) hypertension: Secondary | ICD-10-CM | POA: Diagnosis not present

## 2013-02-26 DIAGNOSIS — I4891 Unspecified atrial fibrillation: Secondary | ICD-10-CM | POA: Diagnosis not present

## 2013-03-06 DIAGNOSIS — N35919 Unspecified urethral stricture, male, unspecified site: Secondary | ICD-10-CM | POA: Diagnosis not present

## 2013-03-06 DIAGNOSIS — N320 Bladder-neck obstruction: Secondary | ICD-10-CM | POA: Diagnosis not present

## 2013-03-06 DIAGNOSIS — R310 Gross hematuria: Secondary | ICD-10-CM | POA: Diagnosis not present

## 2013-03-17 DIAGNOSIS — H353290 Exudative age-related macular degeneration, unspecified eye, stage unspecified: Secondary | ICD-10-CM | POA: Diagnosis not present

## 2013-03-17 DIAGNOSIS — H35359 Cystoid macular degeneration, unspecified eye: Secondary | ICD-10-CM | POA: Diagnosis not present

## 2013-03-21 DIAGNOSIS — G459 Transient cerebral ischemic attack, unspecified: Secondary | ICD-10-CM | POA: Diagnosis not present

## 2013-03-21 DIAGNOSIS — I10 Essential (primary) hypertension: Secondary | ICD-10-CM | POA: Diagnosis not present

## 2013-03-21 DIAGNOSIS — Z7901 Long term (current) use of anticoagulants: Secondary | ICD-10-CM | POA: Diagnosis not present

## 2013-03-21 DIAGNOSIS — I4891 Unspecified atrial fibrillation: Secondary | ICD-10-CM | POA: Diagnosis not present

## 2013-04-29 DIAGNOSIS — Z23 Encounter for immunization: Secondary | ICD-10-CM | POA: Diagnosis not present

## 2013-04-30 DIAGNOSIS — L821 Other seborrheic keratosis: Secondary | ICD-10-CM | POA: Diagnosis not present

## 2013-04-30 DIAGNOSIS — C44721 Squamous cell carcinoma of skin of unspecified lower limb, including hip: Secondary | ICD-10-CM | POA: Diagnosis not present

## 2013-04-30 DIAGNOSIS — D0430 Carcinoma in situ of skin of unspecified part of face: Secondary | ICD-10-CM | POA: Diagnosis not present

## 2013-04-30 DIAGNOSIS — Z85820 Personal history of malignant melanoma of skin: Secondary | ICD-10-CM | POA: Diagnosis not present

## 2013-04-30 DIAGNOSIS — D2360 Other benign neoplasm of skin of unspecified upper limb, including shoulder: Secondary | ICD-10-CM | POA: Diagnosis not present

## 2013-04-30 DIAGNOSIS — D485 Neoplasm of uncertain behavior of skin: Secondary | ICD-10-CM | POA: Diagnosis not present

## 2013-04-30 DIAGNOSIS — L570 Actinic keratosis: Secondary | ICD-10-CM | POA: Diagnosis not present

## 2013-04-30 DIAGNOSIS — D0460 Carcinoma in situ of skin of unspecified upper limb, including shoulder: Secondary | ICD-10-CM | POA: Diagnosis not present

## 2013-04-30 DIAGNOSIS — D0439 Carcinoma in situ of skin of other parts of face: Secondary | ICD-10-CM | POA: Diagnosis not present

## 2013-04-30 DIAGNOSIS — Z85828 Personal history of other malignant neoplasm of skin: Secondary | ICD-10-CM | POA: Diagnosis not present

## 2013-05-09 ENCOUNTER — Telehealth: Payer: Self-pay | Admitting: Interventional Cardiology

## 2013-05-09 NOTE — Telephone Encounter (Signed)
New problem:  Pt states he has back pain. Pt states he believes he strained it a little bit. Pt is asking if he should take Nabumatone instead of tylenol. Pt has both those meds. Pt states Dr. Katrinka Blazing had previously stopped his Nabumatone. Please advise

## 2013-05-09 NOTE — Telephone Encounter (Signed)
I spoke with the pt's wife and made her aware that we do not recommend the pt taking NSAIDs due to Eliquis. This would place the pt at increased risk of GI bleed.  I made her aware that Tylenol is okay for the pt to take and he can also do warm compresses if needed. She agreed with plan.

## 2013-05-21 DIAGNOSIS — H353290 Exudative age-related macular degeneration, unspecified eye, stage unspecified: Secondary | ICD-10-CM | POA: Diagnosis not present

## 2013-05-21 DIAGNOSIS — H35059 Retinal neovascularization, unspecified, unspecified eye: Secondary | ICD-10-CM | POA: Diagnosis not present

## 2013-05-21 DIAGNOSIS — H35359 Cystoid macular degeneration, unspecified eye: Secondary | ICD-10-CM | POA: Diagnosis not present

## 2013-06-05 ENCOUNTER — Encounter: Payer: Self-pay | Admitting: Interventional Cardiology

## 2013-07-21 DIAGNOSIS — H35359 Cystoid macular degeneration, unspecified eye: Secondary | ICD-10-CM | POA: Diagnosis not present

## 2013-07-21 DIAGNOSIS — H35059 Retinal neovascularization, unspecified, unspecified eye: Secondary | ICD-10-CM | POA: Diagnosis not present

## 2013-07-21 DIAGNOSIS — H353290 Exudative age-related macular degeneration, unspecified eye, stage unspecified: Secondary | ICD-10-CM | POA: Diagnosis not present

## 2013-08-20 ENCOUNTER — Emergency Department (HOSPITAL_COMMUNITY): Payer: Medicare Other

## 2013-08-20 ENCOUNTER — Encounter (HOSPITAL_COMMUNITY): Payer: Self-pay | Admitting: Emergency Medicine

## 2013-08-20 ENCOUNTER — Inpatient Hospital Stay (HOSPITAL_COMMUNITY)
Admission: EM | Admit: 2013-08-20 | Discharge: 2013-08-28 | DRG: 340 | Disposition: A | Discharge status: Skilled Nursing Facility | Payer: Medicare Other | Attending: Surgery | Admitting: Surgery

## 2013-08-20 DIAGNOSIS — I498 Other specified cardiac arrhythmias: Secondary | ICD-10-CM | POA: Diagnosis present

## 2013-08-20 DIAGNOSIS — R339 Retention of urine, unspecified: Secondary | ICD-10-CM | POA: Diagnosis not present

## 2013-08-20 DIAGNOSIS — N35919 Unspecified urethral stricture, male, unspecified site: Secondary | ICD-10-CM | POA: Diagnosis not present

## 2013-08-20 DIAGNOSIS — Z7901 Long term (current) use of anticoagulants: Secondary | ICD-10-CM | POA: Diagnosis not present

## 2013-08-20 DIAGNOSIS — R32 Unspecified urinary incontinence: Secondary | ICD-10-CM | POA: Diagnosis present

## 2013-08-20 DIAGNOSIS — E785 Hyperlipidemia, unspecified: Secondary | ICD-10-CM | POA: Diagnosis present

## 2013-08-20 DIAGNOSIS — Z9889 Other specified postprocedural states: Secondary | ICD-10-CM | POA: Diagnosis not present

## 2013-08-20 DIAGNOSIS — Z87891 Personal history of nicotine dependence: Secondary | ICD-10-CM | POA: Diagnosis not present

## 2013-08-20 DIAGNOSIS — K3533 Acute appendicitis with perforation and localized peritonitis, with abscess: Principal | ICD-10-CM | POA: Diagnosis present

## 2013-08-20 DIAGNOSIS — R109 Unspecified abdominal pain: Secondary | ICD-10-CM | POA: Diagnosis not present

## 2013-08-20 DIAGNOSIS — Z96659 Presence of unspecified artificial knee joint: Secondary | ICD-10-CM

## 2013-08-20 DIAGNOSIS — R6883 Chills (without fever): Secondary | ICD-10-CM | POA: Diagnosis not present

## 2013-08-20 DIAGNOSIS — I10 Essential (primary) hypertension: Secondary | ICD-10-CM | POA: Diagnosis not present

## 2013-08-20 DIAGNOSIS — Z923 Personal history of irradiation: Secondary | ICD-10-CM | POA: Diagnosis not present

## 2013-08-20 DIAGNOSIS — H3530 Unspecified macular degeneration: Secondary | ICD-10-CM | POA: Diagnosis present

## 2013-08-20 DIAGNOSIS — I4891 Unspecified atrial fibrillation: Secondary | ICD-10-CM | POA: Diagnosis not present

## 2013-08-20 DIAGNOSIS — Z85828 Personal history of other malignant neoplasm of skin: Secondary | ICD-10-CM

## 2013-08-20 DIAGNOSIS — Z87440 Personal history of urinary (tract) infections: Secondary | ICD-10-CM | POA: Diagnosis not present

## 2013-08-20 DIAGNOSIS — K3580 Unspecified acute appendicitis: Secondary | ICD-10-CM | POA: Diagnosis not present

## 2013-08-20 DIAGNOSIS — Z9049 Acquired absence of other specified parts of digestive tract: Secondary | ICD-10-CM

## 2013-08-20 DIAGNOSIS — K37 Unspecified appendicitis: Secondary | ICD-10-CM | POA: Diagnosis not present

## 2013-08-20 DIAGNOSIS — Z5189 Encounter for other specified aftercare: Secondary | ICD-10-CM | POA: Diagnosis not present

## 2013-08-20 DIAGNOSIS — Z79899 Other long term (current) drug therapy: Secondary | ICD-10-CM | POA: Diagnosis not present

## 2013-08-20 DIAGNOSIS — M6281 Muscle weakness (generalized): Secondary | ICD-10-CM | POA: Diagnosis not present

## 2013-08-20 DIAGNOSIS — Z8673 Personal history of transient ischemic attack (TIA), and cerebral infarction without residual deficits: Secondary | ICD-10-CM | POA: Diagnosis not present

## 2013-08-20 DIAGNOSIS — R262 Difficulty in walking, not elsewhere classified: Secondary | ICD-10-CM | POA: Diagnosis not present

## 2013-08-20 DIAGNOSIS — Z8546 Personal history of malignant neoplasm of prostate: Secondary | ICD-10-CM | POA: Diagnosis not present

## 2013-08-20 DIAGNOSIS — Z48815 Encounter for surgical aftercare following surgery on the digestive system: Secondary | ICD-10-CM | POA: Diagnosis not present

## 2013-08-20 HISTORY — DX: Anxiety disorder, unspecified: F41.9

## 2013-08-20 HISTORY — DX: Unspecified osteoarthritis, unspecified site: M19.90

## 2013-08-20 LAB — URINALYSIS W MICROSCOPIC + REFLEX CULTURE
Bilirubin Urine: NEGATIVE
GLUCOSE, UA: NEGATIVE mg/dL
Hgb urine dipstick: NEGATIVE
KETONES UR: 15 mg/dL — AB
Leukocytes, UA: NEGATIVE
NITRITE: NEGATIVE
PH: 5.5 (ref 5.0–8.0)
Protein, ur: NEGATIVE mg/dL
SPECIFIC GRAVITY, URINE: 1.021 (ref 1.005–1.030)
Urine-Other: NONE SEEN
Urobilinogen, UA: 0.2 mg/dL (ref 0.0–1.0)

## 2013-08-20 LAB — CBC WITH DIFFERENTIAL/PLATELET
BASOS ABS: 0 10*3/uL (ref 0.0–0.1)
Basophils Relative: 0 % (ref 0–1)
EOS PCT: 0 % (ref 0–5)
Eosinophils Absolute: 0 10*3/uL (ref 0.0–0.7)
HCT: 45.3 % (ref 39.0–52.0)
HEMOGLOBIN: 15.5 g/dL (ref 13.0–17.0)
Lymphocytes Relative: 4 % — ABNORMAL LOW (ref 12–46)
Lymphs Abs: 0.6 10*3/uL — ABNORMAL LOW (ref 0.7–4.0)
MCH: 30.9 pg (ref 26.0–34.0)
MCHC: 34.2 g/dL (ref 30.0–36.0)
MCV: 90.2 fL (ref 78.0–100.0)
Monocytes Absolute: 0.8 10*3/uL (ref 0.1–1.0)
Monocytes Relative: 5 % (ref 3–12)
NEUTROS PCT: 91 % — AB (ref 43–77)
Neutro Abs: 14.7 10*3/uL — ABNORMAL HIGH (ref 1.7–7.7)
Platelets: 168 10*3/uL (ref 150–400)
RBC: 5.02 MIL/uL (ref 4.22–5.81)
RDW: 14.2 % (ref 11.5–15.5)
WBC: 16.1 10*3/uL — ABNORMAL HIGH (ref 4.0–10.5)

## 2013-08-20 LAB — COMPREHENSIVE METABOLIC PANEL
ALK PHOS: 74 U/L (ref 39–117)
ALT: 14 U/L (ref 0–53)
AST: 16 U/L (ref 0–37)
Albumin: 4 g/dL (ref 3.5–5.2)
BILIRUBIN TOTAL: 1.6 mg/dL — AB (ref 0.3–1.2)
BUN: 12 mg/dL (ref 6–23)
CALCIUM: 9.1 mg/dL (ref 8.4–10.5)
CHLORIDE: 96 meq/L (ref 96–112)
CO2: 23 meq/L (ref 19–32)
Creatinine, Ser: 0.82 mg/dL (ref 0.50–1.35)
GFR, EST NON AFRICAN AMERICAN: 78 mL/min — AB (ref >90)
GLUCOSE: 148 mg/dL — AB (ref 70–99)
Potassium: 3.9 mEq/L (ref 3.7–5.3)
SODIUM: 137 meq/L (ref 137–147)
Total Protein: 6.9 g/dL (ref 6.0–8.3)

## 2013-08-20 MED ORDER — SODIUM CHLORIDE 0.9 % IV SOLN
1.0000 g | INTRAVENOUS | Status: DC
  Administered 2013-08-20 – 2013-08-26 (×7): 1 g via INTRAVENOUS
  Filled 2013-08-20 (×7): qty 1

## 2013-08-20 MED ORDER — ONDANSETRON HCL 4 MG/2ML IJ SOLN
4.0000 mg | Freq: Once | INTRAMUSCULAR | Status: AC
  Administered 2013-08-20: 4 mg via INTRAVENOUS
  Filled 2013-08-20: qty 2

## 2013-08-20 MED ORDER — IOHEXOL 300 MG/ML  SOLN
50.0000 mL | Freq: Once | INTRAMUSCULAR | Status: AC | PRN
  Administered 2013-08-20: 50 mL via ORAL

## 2013-08-20 MED ORDER — OSELTAMIVIR PHOSPHATE 75 MG PO CAPS
75.0000 mg | ORAL_CAPSULE | ORAL | Status: AC
  Administered 2013-08-20: 75 mg via ORAL
  Filled 2013-08-20: qty 1

## 2013-08-20 MED ORDER — MORPHINE SULFATE 2 MG/ML IJ SOLN
2.0000 mg | INTRAMUSCULAR | Status: DC | PRN
  Administered 2013-08-25: 2 mg via INTRAVENOUS
  Filled 2013-08-20: qty 1

## 2013-08-20 MED ORDER — IOHEXOL 300 MG/ML  SOLN
100.0000 mL | Freq: Once | INTRAMUSCULAR | Status: AC | PRN
  Administered 2013-08-20: 100 mL via INTRAVENOUS

## 2013-08-20 MED ORDER — HYDROCODONE-ACETAMINOPHEN 5-325 MG PO TABS
1.0000 | ORAL_TABLET | ORAL | Status: DC | PRN
Start: 2013-08-20 — End: 2013-08-28
  Administered 2013-08-24: 2 via ORAL
  Filled 2013-08-20: qty 2

## 2013-08-20 MED ORDER — KCL IN DEXTROSE-NACL 20-5-0.45 MEQ/L-%-% IV SOLN
INTRAVENOUS | Status: DC
  Administered 2013-08-20 – 2013-08-23 (×6): via INTRAVENOUS
  Administered 2013-08-24 – 2013-08-25 (×2): 10 mL/h via INTRAVENOUS
  Filled 2013-08-20 (×13): qty 1000

## 2013-08-20 MED ORDER — ONDANSETRON HCL 4 MG/2ML IJ SOLN: 4.0000 mg | Freq: Four times a day (QID) | INTRAMUSCULAR | Status: DC | PRN

## 2013-08-20 MED ORDER — ACETAMINOPHEN 325 MG PO TABS
650.0000 mg | ORAL_TABLET | Freq: Once | ORAL | Status: AC
  Administered 2013-08-20: 650 mg via ORAL
  Filled 2013-08-20: qty 2

## 2013-08-20 MED ORDER — AMLODIPINE BESYLATE 5 MG PO TABS
5.0000 mg | ORAL_TABLET | Freq: Every evening | ORAL | Status: DC
  Administered 2013-08-20 – 2013-08-27 (×8): 5 mg via ORAL
  Filled 2013-08-20 (×9): qty 1

## 2013-08-20 MED ORDER — CLONAZEPAM 0.5 MG PO TABS
0.5000 mg | ORAL_TABLET | Freq: Two times a day (BID) | ORAL | Status: DC | PRN
  Administered 2013-08-25: 0.5 mg via ORAL
  Filled 2013-08-20: qty 1

## 2013-08-20 NOTE — ED Provider Notes (Signed)
CSN: [id number]     Arrival date & time 08/20/13  1514 History   First MD Initiated Contact with Patient 08/20/13 1525     Chief Complaint  Patient presents with  . Dizziness  . Nausea   (Consider location/radiation/quality/duration/timing/severity/associated sxs/prior Treatment) Patient is a 80 y.o. male presenting with URI. The history is provided by the patient.  URI Presenting symptoms: cough   Presenting symptoms: no fever and no rhinorrhea   Severity:  Mild Onset quality:  Gradual Duration:  1 day Timing:  Intermittent Progression:  Unchanged Chronicity:  New Relieved by:  Nothing Worsened by:  Nothing tried Ineffective treatments:  None tried Associated symptoms: myalgias   Associated symptoms: no headaches and no neck pain     Past Medical History  Diagnosis Date  . Hypertension   . Hyperlipidemia   . History of TIA (transient ischemic attack)     probable tia no residual  09-09-2012  . Gross hematuria   . Bladder neck contracture   . Urinary retention   . History of prostate cancer     S/P RADIOACTIVE SEED IMPLANTS  . Urethral stricture   . History of gout   . Macular degeneration     both eyes  . History of squamous cell carcinoma of skin     EXCISION LOWER LIP AND RIGHT EAR  . Dysrhythmia     A-FIB / HX BRADYCARDIA - FOLLOWED BY DR. [place] DUE TO [name]   Past Surgical History  Procedure Laterality Date  . Radioactive prostate seed implants  1998  . Total knee arthroplasty Right 11-23-2004  . Tonsillectomy  as child  . Cholecystectomy  1977  . Cataract extraction w/ intraocular lens  implant, bilateral    . Cystoscopy with urethral dilatation N/A 02/12/2013    Procedure: CYSTOSCOPY WITH BALLOON DILATATION OF URETHRAL STRICTURE AND BLADDER FULGERATION;  Surgeon: Bernestine Amass, MD;  Location: [facility];  Service: Urology;  Laterality: N/A;   No family history on file. History  Substance Use Topics  . Smoking status: Former  Smoker -- 1.50 packs/day for 35 years    Types: Cigarettes    Quit date: 01/30/1983  . Smokeless tobacco: Never Used  . Alcohol Use: Yes     Comment: occasional    Review of Systems  Constitutional: Negative for fever.  HENT: Negative for drooling and rhinorrhea.   Eyes: Negative for pain.  Respiratory: Positive for cough. Negative for chest tightness and shortness of breath.   Cardiovascular: Negative for chest pain and leg swelling.  Gastrointestinal: Positive for nausea and abdominal pain (intermittent). Negative for vomiting and diarrhea.  Genitourinary: Negative for dysuria and hematuria.  Musculoskeletal: Positive for myalgias. Negative for gait problem and neck pain.  Skin: Negative for color change.  Neurological: Positive for dizziness. Negative for numbness and headaches.       Tremulousness  Hematological: Negative for adenopathy.  Psychiatric/Behavioral: Negative for behavioral problems.  All other systems reviewed and are negative.    Allergies  Review of patient's allergies indicates no known allergies.  Home Medications   Current Outpatient Rx  Name  Route  Sig  Dispense  Refill  . amLODipine (NORVASC) 5 MG tablet   Oral   Take 5 mg by mouth every evening.          Marland Kitchen apixaban (ELIQUIS) 5 MG TABS tablet   Oral   Take 5 mg by mouth 2 (two) times daily.         Marland Kitchen  atorvastatin (LIPITOR) 10 MG tablet   Oral   Take 10 mg by mouth every evening.          . clonazePAM (KLONOPIN) 0.5 MG tablet               . docusate sodium (COLACE) 100 MG capsule   Oral   Take 100 mg by mouth 2 (two) times daily.         . Multiple Vitamins-Minerals (OCUVITE PRESERVISION PO)   Oral   Take 1 tablet by mouth every morning.          . nabumetone (RELAFEN) 500 MG tablet   Oral   Take 500 mg by mouth 2 (two) times daily.         . psyllium (METAMUCIL) 58.6 % powder   Oral   Take 1 packet by mouth 3 (three) times daily.         Marland Kitchen  sulfamethoxazole-trimethoprim (BACTRIM DS,SEPTRA DS) 800-160 MG per tablet   Oral   Take 1 tablet by mouth 2 (two) times daily.          BP 169/85  Pulse 77  Temp(Src) 97.6 F (36.4 C) (Oral)  Resp 20  SpO2 99% Physical Exam  Nursing note and vitals reviewed. Constitutional: He is oriented to person, place, and time. He appears well-developed and well-nourished.  HENT:  Head: Normocephalic and atraumatic.  Right Ear: External ear normal.  Left Ear: External ear normal.  Nose: Nose normal.  Mouth/Throat: Oropharynx is clear and moist. No oropharyngeal exudate.  Eyes: Conjunctivae and EOM are normal. Pupils are equal, round, and reactive to light.  Neck: Normal range of motion. Neck supple.  Cardiovascular: Normal rate, regular rhythm, normal heart sounds and intact distal pulses.  Exam reveals no gallop and no friction rub.   No murmur heard. Pulmonary/Chest: Effort normal and breath sounds normal. No respiratory distress. He has no wheezes.  Abdominal: Soft. Bowel sounds are normal. He exhibits no distension. There is no tenderness. There is no rebound and no guarding.  Musculoskeletal: Normal range of motion. He exhibits edema (mild pitting edema in bilateral ankles.). He exhibits no tenderness.  Neurological: He is alert and oriented to person, place, and time. He has normal strength. No cranial nerve deficit or sensory deficit. He displays a negative Romberg sign. Coordination and gait normal.  Normal finger to nose bilaterally.  Normal speech and understanding.  Patient ambulate forwards and backwards without difficulty.  Mild tremor when hands outstretched.   Skin: Skin is warm and dry.  Psychiatric: He has a normal mood and affect. His behavior is normal.    ED Course  Procedures (including critical care time) Labs Review Labs Reviewed  SURGICAL PCR SCREEN - Abnormal; Notable for the following:    MRSA, PCR INVALID RESULTS, SPECIMEN SENT FOR CULTURE (*)     Staphylococcus aureus INVALID RESULTS, SPECIMEN SENT FOR CULTURE (*)    All other components within normal limits  CBC WITH DIFFERENTIAL - Abnormal; Notable for the following:    WBC 16.1 (*)    Neutrophils Relative % 91 (*)    Neutro Abs 14.7 (*)    Lymphocytes Relative 4 (*)    Lymphs Abs 0.6 (*)    All other components within normal limits  COMPREHENSIVE METABOLIC PANEL - Abnormal; Notable for the following:    Glucose, Bld 148 (*)    Total Bilirubin 1.6 (*)    GFR calc non Af Amer 78 (*)    All other  components within normal limits  URINALYSIS W MICROSCOPIC + REFLEX CULTURE - Abnormal; Notable for the following:    Color, Urine AMBER (*)    Ketones, ur 15 (*)    All other components within normal limits  CBC - Abnormal; Notable for the following:    WBC 17.4 (*)    All other components within normal limits  MRSA CULTURE   Imaging Review Dg Chest 2 View  08/20/2013   CLINICAL DATA:  Shakiness.  Chills.  EXAM: CHEST  2 VIEW  COMPARISON:  Chest x-ray 02/12/2013.  FINDINGS: Lung volumes are normal. No consolidative airspace disease. No pleural effusions. No evidence of pulmonary edema. Mild cardiomegaly. Upper mediastinal contours are within normal limits.  IMPRESSION: 1. No radiographic evidence of acute cardiopulmonary disease. 2. Mild cardiomegaly.   Electronically Signed   By: Vinnie Langton M.D.   On: 08/20/2013 16:52   Ct Abdomen Pelvis W Contrast  08/20/2013   CLINICAL DATA:  Lower abdominal pain with nausea.  EXAM: CT ABDOMEN AND PELVIS WITH CONTRAST  TECHNIQUE: Multidetector CT imaging of the abdomen and pelvis was performed using the standard protocol following bolus administration of intravenous contrast.  CONTRAST:  58mL OMNIPAQUE IOHEXOL 300 MG/ML SOLN, 11mL OMNIPAQUE IOHEXOL 300 MG/ML SOLN  COMPARISON:  None.  FINDINGS: Lung bases demonstrate a 7 mm nodule over the lateral left lower lobe. There is a 4 mm nodule opacity along the right major fissure. There is mild  cardiomegaly and a small amount of pericardial fluid present.  Abdominal images demonstrate evidence of a prior cholecystectomy. The liver, spleen, pancreas and adrenal glands are within normal. Kidneys are normal in size without evidence of hydronephrosis or nephrolithiasis. The ureters are within normal. There is diverticulosis of the colon. Minimal calcified atherosclerotic plaque involving the abdominal aorta.  In the right lower quadrant, there is an inflamed appendix measuring 1.5 cm in diameter with mucosal enhancement and adjacent inflammation of the mesenteric fat and small amount of free fluid in the right lower quadrant/ pelvis. There is no evidence of perforation or abscess.  Remaining pelvic images are notable for radiation seed implants over the prostate. There are mild degenerative changes of the spine and hips.  IMPRESSION: Evidence of acute appendicitis. No evidence of perforation or abscess.  Mild cardiomegaly and small amount of pericardial fluid.  Diverticulosis of the colon.  4 mm nodule along the right major fissure and a 7 mm nodule over the lateral left lower lobe.  If the patient is at high risk for bronchogenic carcinoma, follow-up chest CT at 3-62months is recommended. If the patient is at low risk for bronchogenic carcinoma, follow-up chest CT at 6-12 months is recommended. This recommendation follows the consensus statement: Guidelines for Management of Small Pulmonary Nodules Detected on CT Scans: A Statement from the [place] as published in Radiology 2005; 237:395-400.  Critical Value/emergent results were called by telephone at the time of interpretation on 08/20/2013 at 7:05 PM to Dr. [name], [name] , who verbally acknowledged these results.   Electronically Signed   By: Marin Olp M.D.   On: 08/20/2013 19:06    EKG Interpretation    Date/Time:  Wednesday August 20 2013 15:51:45 EST Ventricular Rate:  74 PR Interval:    QRS Duration: 98 QT Interval:  374 QTC  Calculation: 415 R Axis:   74 Text Interpretation:  Atrial fibrillation Low voltage, extremity leads No significant change since last tracing Confirmed by Collin Hendley  MD, Amrie Gurganus ([id number]) on 08/20/2013 3:54:53 PM  MDM   1. Appendicitis    3:42 PM 80 y.o. male w/ hx of afib on eliquis who presents with multiple symptoms that began yesterday evening. He complains of mild nausea, intermittent dizziness, mild lower abdominal aching, chills, body aches, and mild nonproductive cough which began yesterday evening. He currently denies any abdominal pain on exam. He does have a history of UTI and said his abdominal pain felt similar to that. He is afebrile and vital signs are unremarkable here. He has a normal neurologic exam and is a ambulatory forwards and backwards. He denies any dizziness currently on exam. Doubt TIA/CVA given normal neuro exam. Likely viral syndrome such as influenza.  Will get screening labs and re-evaluate.  On repeat exam abd mildly tender to RLQ. Labs show leukocytosis w/ left shift. Will get CT abd to r/o acute cause of abd pain.   CT concerning for appendicitis. Consulted [facility] and notified family. Will admit to [place].     Blanchard Kelch, MD 08/21/13 1136

## 2013-08-20 NOTE — H&P (Signed)
Justin Potter is an 80 y.o. male.   Chief Complaint: abdominal pain HPI: patient is a very pleasant 80 year old male resident of [place], very active and still driving, who presents with acute abdominal pain. This started gradually about 24-36 hours ago with some intermittent diffuse abdominal pain that gradually became worse last night and then this morning. It became significantly more intense later today and more localized in the lower right abdomen and he presented to the emergency room. He has had some mild nausea but no vomiting. No fever or chills. Normal bowel movement yesterday. No melena or hematochezia. No history of chronic pain or GI complaints.  Past Medical History  Diagnosis Date  . Hypertension   . Hyperlipidemia   . History of TIA (transient ischemic attack)     probable tia no residual  09-09-2012  . Gross hematuria   . Bladder neck contracture   . Urinary retention   . History of prostate cancer     S/P RADIOACTIVE SEED IMPLANTS  . Urethral stricture   . History of gout   . Macular degeneration     both eyes  . History of squamous cell carcinoma of skin     EXCISION LOWER LIP AND RIGHT EAR  . Dysrhythmia     A-FIB / HX BRADYCARDIA - FOLLOWED BY DR. [place] DUE TO [name]    Past Surgical History  Procedure Laterality Date  . Radioactive prostate seed implants  1998  . Total knee arthroplasty Right 11-23-2004  . Tonsillectomy  as child  . Cholecystectomy  1977  . Cataract extraction w/ intraocular lens  implant, bilateral    . Cystoscopy with urethral dilatation N/A 02/12/2013    Procedure: CYSTOSCOPY WITH BALLOON DILATATION OF URETHRAL STRICTURE AND BLADDER FULGERATION;  Surgeon: Bernestine Amass, MD;  Location: [facility];  Service: Urology;  Laterality: N/A;    No family history on file. Social History:  reports that he quit smoking about 30 years ago. His smoking use included Cigarettes. He has a 52.5 pack-year smoking history. He has  never used smokeless tobacco. He reports that he drinks alcohol. He reports that he does not use illicit drugs.  Allergies: No Known Allergies  No current facility-administered medications for this encounter.   Current Outpatient Prescriptions  Medication Sig Dispense Refill  . amLODipine (NORVASC) 5 MG tablet Take 5 mg by mouth every evening.       Marland Kitchen apixaban (ELIQUIS) 5 MG TABS tablet Take 5 mg by mouth 2 (two) times daily.      Marland Kitchen atorvastatin (LIPITOR) 10 MG tablet Take 10 mg by mouth every evening.       . clonazePAM (KLONOPIN) 0.5 MG tablet Take 0.5 mg by mouth 2 (two) times daily as needed for anxiety.       . docusate sodium (COLACE) 100 MG capsule Take 100 mg by mouth 2 (two) times daily.      . Multiple Vitamins-Minerals (OCUVITE PRESERVISION PO) Take 1 tablet by mouth every morning.       . nabumetone (RELAFEN) 500 MG tablet Take 500 mg by mouth 2 (two) times daily.         Results for orders placed during the hospital encounter of 08/20/13 (from the past 48 hour(s))  CBC WITH DIFFERENTIAL     Status: Abnormal   Collection Time    08/20/13  4:07 PM      Result Value Range   WBC 16.1 (*) 4.0 - 10.5  K/uL   RBC 5.02  4.22 - 5.81 MIL/uL   Hemoglobin 15.5  13.0 - 17.0 g/dL   HCT 45.3  39.0 - 52.0 %   MCV 90.2  78.0 - 100.0 fL   MCH 30.9  26.0 - 34.0 pg   MCHC 34.2  30.0 - 36.0 g/dL   RDW 14.2  11.5 - 15.5 %   Platelets 168  150 - 400 K/uL   Neutrophils Relative % 91 (*) 43 - 77 %   Neutro Abs 14.7 (*) 1.7 - 7.7 K/uL   Lymphocytes Relative 4 (*) 12 - 46 %   Lymphs Abs 0.6 (*) 0.7 - 4.0 K/uL   Monocytes Relative 5  3 - 12 %   Monocytes Absolute 0.8  0.1 - 1.0 K/uL   Eosinophils Relative 0  0 - 5 %   Eosinophils Absolute 0.0  0.0 - 0.7 K/uL   Basophils Relative 0  0 - 1 %   Basophils Absolute 0.0  0.0 - 0.1 K/uL  COMPREHENSIVE METABOLIC PANEL     Status: Abnormal   Collection Time    08/20/13  4:07 PM      Result Value Range   Sodium 137  137 - 147 mEq/L   Potassium  3.9  3.7 - 5.3 mEq/L   Chloride 96  96 - 112 mEq/L   CO2 23  19 - 32 mEq/L   Glucose, Bld 148 (*) 70 - 99 mg/dL   BUN 12  6 - 23 mg/dL   Creatinine, Ser 0.82  0.50 - 1.35 mg/dL   Calcium 9.1  8.4 - 10.5 mg/dL   Total Protein 6.9  6.0 - 8.3 g/dL   Albumin 4.0  3.5 - 5.2 g/dL   AST 16  0 - 37 U/L   ALT 14  0 - 53 U/L   Alkaline Phosphatase 74  39 - 117 U/L   Total Bilirubin 1.6 (*) 0.3 - 1.2 mg/dL   GFR calc non Af Amer 78 (*) >90 mL/min   GFR calc Af Amer >90  >90 mL/min   Comment: (NOTE)     The eGFR has been calculated using the CKD EPI equation.     This calculation has not been validated in all clinical situations.     eGFR's persistently <90 mL/min signify possible Chronic Kidney     Disease.  URINALYSIS W MICROSCOPIC + REFLEX CULTURE     Status: Abnormal   Collection Time    08/20/13  4:32 PM      Result Value Range   Color, Urine AMBER (*) YELLOW   Comment: BIOCHEMICALS MAY BE AFFECTED BY COLOR   APPearance CLEAR  CLEAR   Specific Gravity, Urine 1.021  1.005 - 1.030   pH 5.5  5.0 - 8.0   Glucose, UA NEGATIVE  NEGATIVE mg/dL   Hgb urine dipstick NEGATIVE  NEGATIVE   Bilirubin Urine NEGATIVE  NEGATIVE   Ketones, ur 15 (*) NEGATIVE mg/dL   Protein, ur NEGATIVE  NEGATIVE mg/dL   Urobilinogen, UA 0.2  0.0 - 1.0 mg/dL   Nitrite NEGATIVE  NEGATIVE   Leukocytes, UA NEGATIVE  NEGATIVE   Urine-Other       Value: NO FORMED ELEMENTS SEEN ON URINE MICROSCOPIC EXAMINATION   Dg Chest 2 View  08/20/2013   CLINICAL DATA:  Shakiness.  Chills.  EXAM: CHEST  2 VIEW  COMPARISON:  Chest x-ray 02/12/2013.  FINDINGS: Lung volumes are normal. No consolidative airspace disease. No pleural effusions. No evidence  of pulmonary edema. Mild cardiomegaly. Upper mediastinal contours are within normal limits.  IMPRESSION: 1. No radiographic evidence of acute cardiopulmonary disease. 2. Mild cardiomegaly.   Electronically Signed   By: Vinnie Langton M.D.   On: 08/20/2013 16:52   Ct Abdomen Pelvis W  Contrast  08/20/2013   CLINICAL DATA:  Lower abdominal pain with nausea.  EXAM: CT ABDOMEN AND PELVIS WITH CONTRAST  TECHNIQUE: Multidetector CT imaging of the abdomen and pelvis was performed using the standard protocol following bolus administration of intravenous contrast.  CONTRAST:  68m OMNIPAQUE IOHEXOL 300 MG/ML SOLN, 1047mOMNIPAQUE IOHEXOL 300 MG/ML SOLN  COMPARISON:  None.  FINDINGS: Lung bases demonstrate a 7 mm nodule over the lateral left lower lobe. There is a 4 mm nodule opacity along the right major fissure. There is mild cardiomegaly and a small amount of pericardial fluid present.  Abdominal images demonstrate evidence of a prior cholecystectomy. The liver, spleen, pancreas and adrenal glands are within normal. Kidneys are normal in size without evidence of hydronephrosis or nephrolithiasis. The ureters are within normal. There is diverticulosis of the colon. Minimal calcified atherosclerotic plaque involving the abdominal aorta.  In the right lower quadrant, there is an inflamed appendix measuring 1.5 cm in diameter with mucosal enhancement and adjacent inflammation of the mesenteric fat and small amount of free fluid in the right lower quadrant/ pelvis. There is no evidence of perforation or abscess.  Remaining pelvic images are notable for radiation seed implants over the prostate. There are mild degenerative changes of the spine and hips.  IMPRESSION: Evidence of acute appendicitis. No evidence of perforation or abscess.  Mild cardiomegaly and small amount of pericardial fluid.  Diverticulosis of the colon.  4 mm nodule along the right major fissure and a 7 mm nodule over the lateral left lower lobe.  If the patient is at high risk for bronchogenic carcinoma, follow-up chest CT at 3-8m24months recommended. If the patient is at low risk for bronchogenic carcinoma, follow-up chest CT at 6-12 months is recommended. This recommendation follows the consensus statement: Guidelines for Management of  Small Pulmonary Nodules Detected on CT Scans: A Statement from the [place] published in Radiology 2005; 237:395-400.  Critical Value/emergent results were called by telephone at the time of interpretation on 08/20/2013 at 7:05 PM to Dr. FORShea EvansARAline Brochurewho verbally acknowledged these results.   Electronically Signed   By: DanMarin OlpD.   On: 08/20/2013 19:06    Review of Systems  Constitutional: Negative for fever, chills, weight loss and malaise/fatigue.  Respiratory: Negative for cough, shortness of breath and wheezing.   Cardiovascular: Negative for chest pain and palpitations.  Gastrointestinal: Positive for nausea and abdominal pain. Negative for vomiting, diarrhea, constipation and blood in stool.  Genitourinary: Positive for frequency.       Mild incontinence  Neurological: Negative.     Blood pressure 127/56, pulse 83, temperature 97.6 F (36.4 C), temperature source Oral, resp. rate 21, SpO2 97.00%. Physical Exam  General: Alert, well-developed elderly white male, in no distress Skin: Warm and dry without rash or infection, multiple keratoses HEENT: No palpable masses or thyromegaly. Sclera nonicteric.  Lymph nodes: No cervical, supraclavicular, or inguinal nodes palpable. Lungs: Breath sounds clear and equal without increased work of breathing Cardiovascular: irregular rhythm without murmur. No JVD. 1+ lower extremity edema. Peripheral pulses intact. Abdomen:minimal distention. Bowel sounds are hypoactive. There is localized right lower quadrant tenderness with guarding. The remainder of the abdomen is nontender.  No palpable masses or organomegaly. No hernias detected. Well-healed Coker incision Extremities: 1+ edema, no joint swelling or deformity. No chronic venous stasis changes. Neurologic: Alert and fully oriented. No gross motor deficits.  Assessment/Plan Acute appendicitis in this 80 year old male. No evidence of perforation or abscess or diffuse  peritonitis on exam or by CT scan. His situation is complicated by anticoagulation with Eliquis. I confirmed with pharmacy that there is no reliable reversal agent. The half life is 12 hours and ideally 6 half lives off the medication prior to surgery is recommended although there is apparently significant improvement in clotting within 24 hours. It has currently been 12 hours since his last dose. I've recommended admission and starting on IV antibiotics. He should be okay for surgery within 24-36 hours and I believe this would not be excessive risk from his nonperforated appendicitis and would significantly reduce the risk of bleeding complications. This was discussed with the patient and family and they are in agreement.  Zuriel Roskos T 08/20/2013, 8:19 PM

## 2013-08-20 NOTE — ED Notes (Signed)
Pt c/o shakiness, chills, "upset stomach", dizziness and nausea since yesterday. Pt states he has not vomited. Pt denies pain. Pt states he has had a slight non productive cough. Pt alert, no acute distress. Skin warm and dry. Pt arrives with family member.

## 2013-08-20 NOTE — Progress Notes (Signed)
   CARE MANAGEMENT ED NOTE 08/20/2013  Patient:  Justin Potter, Justin Potter   Account Number:  [id number]  Date Initiated:  08/20/2013  Documentation initiated by:  Jackelyn Poling  Subjective/Objective Assessment:   80 yr old medicare male without pcp listed in EPIC     Subjective/Objective Assessment Detail:   Pt confirmed pcp is Nehemiah Settle     Action/Plan:   CM updated EPIC   Action/Plan Detail:   Anticipated DC Date:       Status Recommendation to Physician:   Result of Recommendation:    Other ED [place]  Other  PCP issues  Outpatient Services - Pt will follow up    Choice offered to / List presented to:            Status of service:  Completed, signed off  ED Comments:   ED Comments Detail:

## 2013-08-20 NOTE — Progress Notes (Signed)
08/20/2013 A. Maly Lemarr RNCM 1738pm EDCM spoke to patient and his daughter in law at bedside.  Patient lives at home with his wife.  Patient's wife is currently at [facility] having rehab from a broken hip.  Patient lives in [place].  Patient has a walker, cand and safety rails in the bathroom at home.  Patient does not have home health services currently.  Patient has had home health in the past after a broken knee.  Patient does not believe he needs home health at this time.  EDCM provided patient with a list of home health agencies in [facility] if needed for future use.  Patient confirms his pcp is Dr. Effie Shy.  Patient and family thankful for resources.  No further EDCM needs at this time.

## 2013-08-20 NOTE — ED Notes (Signed)
Pt given urinal and made aware of need for urine specimen 

## 2013-08-21 DIAGNOSIS — K3580 Unspecified acute appendicitis: Secondary | ICD-10-CM

## 2013-08-21 DIAGNOSIS — R109 Unspecified abdominal pain: Secondary | ICD-10-CM | POA: Diagnosis not present

## 2013-08-21 LAB — CBC
HCT: 40 % (ref 39.0–52.0)
HEMOGLOBIN: 13 g/dL (ref 13.0–17.0)
MCH: 29.7 pg (ref 26.0–34.0)
MCHC: 32.5 g/dL (ref 30.0–36.0)
MCV: 91.3 fL (ref 78.0–100.0)
PLATELETS: 164 10*3/uL (ref 150–400)
RBC: 4.38 MIL/uL (ref 4.22–5.81)
RDW: 14.5 % (ref 11.5–15.5)
WBC: 17.4 10*3/uL — AB (ref 4.0–10.5)

## 2013-08-21 LAB — SURGICAL PCR SCREEN
MRSA, PCR: INVALID — AB
Staphylococcus aureus: INVALID — AB

## 2013-08-21 NOTE — Progress Notes (Signed)
Patient ID: Justin Potter, male   DOB: 01-04-1926, 80 y.o.   MRN: [id number]    Subjective: Pt feels ok.  Some RLQ discomfort, but well controlled.  No nausea.  No BM for 2 days  Objective: Vital signs in last 24 hours: Temp:  [97.6 F (36.4 C)-98.4 F (36.9 C)] 98.4 F (36.9 C) (01/29 0551) Pulse Rate:  [61-83] 61 (01/29 0551) Resp:  [18-21] 18 (01/29 0551) BP: (106-169)/(55-85) 106/55 mmHg (01/29 0551) SpO2:  [96 %-100 %] 100 % (01/29 0551) Weight:  [192 lb 14.4 oz (87.5 kg)] 192 lb 14.4 oz (87.5 kg) (01/28 2146) Last BM Date: 08/19/13  Intake/Output from previous day: 01/28 0701 - 01/29 0700 In: 750 [I.V.:700; IV Piggyback:50] Out: 300 [Urine:300] Intake/Output this shift:    PE: Abd: soft, appropriately tender in RLQ, +BS, Nd Heart: regular Lungs: CTAB  Lab Results:   Recent Labs  08/20/13 1607 08/21/13 0425  WBC 16.1* 17.4*  HGB 15.5 13.0  HCT 45.3 40.0  PLT 168 164   BMET  Recent Labs  08/20/13 1607  NA 137  K 3.9  CL 96  CO2 23  GLUCOSE 148*  BUN 12  CREATININE 0.82  CALCIUM 9.1   PT/INR No results found for this basename: LABPROT, INR,  in the last 72 hours CMP     Component Value Date/Time   NA 137 08/20/2013 1607   K 3.9 08/20/2013 1607   CL 96 08/20/2013 1607   CO2 23 08/20/2013 1607   GLUCOSE 148* 08/20/2013 1607   BUN 12 08/20/2013 1607   CREATININE 0.82 08/20/2013 1607   CALCIUM 9.1 08/20/2013 1607   PROT 6.9 08/20/2013 1607   ALBUMIN 4.0 08/20/2013 1607   AST 16 08/20/2013 1607   ALT 14 08/20/2013 1607   ALKPHOS 74 08/20/2013 1607   BILITOT 1.6* 08/20/2013 1607   GFRNONAA 78* 08/20/2013 1607   GFRAA >90 08/20/2013 1607   Lipase  No results found for this basename: lipase       Studies/Results: Dg Chest 2 View  08/20/2013   CLINICAL DATA:  Shakiness.  Chills.  EXAM: CHEST  2 VIEW  COMPARISON:  Chest x-ray 02/12/2013.  FINDINGS: Lung volumes are normal. No consolidative airspace disease. No pleural effusions. No evidence of pulmonary  edema. Mild cardiomegaly. Upper mediastinal contours are within normal limits.  IMPRESSION: 1. No radiographic evidence of acute cardiopulmonary disease. 2. Mild cardiomegaly.   Electronically Signed   By: Vinnie Langton M.D.   On: 08/20/2013 16:52   Ct Abdomen Pelvis W Contrast  08/20/2013   CLINICAL DATA:  Lower abdominal pain with nausea.  EXAM: CT ABDOMEN AND PELVIS WITH CONTRAST  TECHNIQUE: Multidetector CT imaging of the abdomen and pelvis was performed using the standard protocol following bolus administration of intravenous contrast.  CONTRAST:  41mL OMNIPAQUE IOHEXOL 300 MG/ML SOLN, 179mL OMNIPAQUE IOHEXOL 300 MG/ML SOLN  COMPARISON:  None.  FINDINGS: Lung bases demonstrate a 7 mm nodule over the lateral left lower lobe. There is a 4 mm nodule opacity along the right major fissure. There is mild cardiomegaly and a small amount of pericardial fluid present.  Abdominal images demonstrate evidence of a prior cholecystectomy. The liver, spleen, pancreas and adrenal glands are within normal. Kidneys are normal in size without evidence of hydronephrosis or nephrolithiasis. The ureters are within normal. There is diverticulosis of the colon. Minimal calcified atherosclerotic plaque involving the abdominal aorta.  In the right lower quadrant, there is an inflamed appendix measuring 1.5 cm  in diameter with mucosal enhancement and adjacent inflammation of the mesenteric fat and small amount of free fluid in the right lower quadrant/ pelvis. There is no evidence of perforation or abscess.  Remaining pelvic images are notable for radiation seed implants over the prostate. There are mild degenerative changes of the spine and hips.  IMPRESSION: Evidence of acute appendicitis. No evidence of perforation or abscess.  Mild cardiomegaly and small amount of pericardial fluid.  Diverticulosis of the colon.  4 mm nodule along the right major fissure and a 7 mm nodule over the lateral left lower lobe.  If the patient is at  high risk for bronchogenic carcinoma, follow-up chest CT at 3-3months is recommended. If the patient is at low risk for bronchogenic carcinoma, follow-up chest CT at 6-12 months is recommended. This recommendation follows the consensus statement: Guidelines for Management of Small Pulmonary Nodules Detected on CT Scans: A Statement from the [place] as published in Radiology 2005; 237:395-400.  Critical Value/emergent results were called by telephone at the time of interpretation on 08/20/2013 at 7:05 PM to Dr. [name], [name] , who verbally acknowledged these results.   Electronically Signed   By: Marin Olp M.D.   On: 08/20/2013 19:06    Anti-infectives: Anti-infectives   Start     Dose/Rate Route Frequency Ordered Stop   08/20/13 2200  ertapenem (INVANZ) 1 g in sodium chloride 0.9 % 50 mL IVPB     1 g 100 mL/hr over 30 Minutes Intravenous Every 24 hours 08/20/13 2157     08/20/13 1800  oseltamivir (TAMIFLU) capsule 75 mg     75 mg Oral STAT 08/20/13 1748 08/20/13 1807       Assessment/Plan  1. Acute appendicitis  2. HTN 3. H/o a fib/bradycardia  Plan: 1. Due to Eliquis, patient's surgery will be held for 24-36hrs.  We will plan for surgery tomorrow afternoon. 2. Cont Invanz D1 3. Hold eliquis and all blood thinners for now, may resume post op 4. Cont npo x ice chips  LOS: 1 day    [name] 08/21/2013, 10:44 AM Pager: [phone]

## 2013-08-21 NOTE — Progress Notes (Signed)
Seen, agree with above.  Lap appy tomorrow after 48 hours off eliquis.

## 2013-08-22 ENCOUNTER — Encounter (HOSPITAL_COMMUNITY): Payer: Medicare Other | Admitting: Anesthesiology

## 2013-08-22 ENCOUNTER — Encounter (HOSPITAL_COMMUNITY)
Admission: EM | Disposition: A | Discharge status: Skilled Nursing Facility | Payer: Self-pay | Source: Home / Self Care | Attending: Surgery

## 2013-08-22 ENCOUNTER — Inpatient Hospital Stay (HOSPITAL_COMMUNITY): Payer: Medicare Other | Admitting: Anesthesiology

## 2013-08-22 ENCOUNTER — Encounter (HOSPITAL_COMMUNITY): Payer: Self-pay | Admitting: Certified Registered"

## 2013-08-22 DIAGNOSIS — N35919 Unspecified urethral stricture, male, unspecified site: Secondary | ICD-10-CM | POA: Diagnosis not present

## 2013-08-22 DIAGNOSIS — I10 Essential (primary) hypertension: Secondary | ICD-10-CM | POA: Diagnosis not present

## 2013-08-22 DIAGNOSIS — I4891 Unspecified atrial fibrillation: Secondary | ICD-10-CM | POA: Diagnosis not present

## 2013-08-22 DIAGNOSIS — K3580 Unspecified acute appendicitis: Secondary | ICD-10-CM | POA: Diagnosis not present

## 2013-08-22 DIAGNOSIS — K3533 Acute appendicitis with perforation and localized peritonitis, with abscess: Secondary | ICD-10-CM | POA: Diagnosis not present

## 2013-08-22 HISTORY — PX: LAPAROSCOPIC APPENDECTOMY: SHX408

## 2013-08-22 LAB — CBC WITH DIFFERENTIAL/PLATELET
BASOS ABS: 0 10*3/uL (ref 0.0–0.1)
Basophils Relative: 0 % (ref 0–1)
Eosinophils Absolute: 0.1 10*3/uL (ref 0.0–0.7)
Eosinophils Relative: 1 % (ref 0–5)
HCT: 41 % (ref 39.0–52.0)
Hemoglobin: 13.3 g/dL (ref 13.0–17.0)
LYMPHS PCT: 12 % (ref 12–46)
Lymphs Abs: 1.3 10*3/uL (ref 0.7–4.0)
MCH: 29.9 pg (ref 26.0–34.0)
MCHC: 32.4 g/dL (ref 30.0–36.0)
MCV: 92.1 fL (ref 78.0–100.0)
MONO ABS: 1.1 10*3/uL — AB (ref 0.1–1.0)
Monocytes Relative: 10 % (ref 3–12)
NEUTROS ABS: 8.4 10*3/uL — AB (ref 1.7–7.7)
NEUTROS PCT: 77 % (ref 43–77)
PLATELETS: 151 10*3/uL (ref 150–400)
RBC: 4.45 MIL/uL (ref 4.22–5.81)
RDW: 14.6 % (ref 11.5–15.5)
WBC: 11 10*3/uL — AB (ref 4.0–10.5)

## 2013-08-22 LAB — BASIC METABOLIC PANEL
BUN: 12 mg/dL (ref 6–23)
CO2: 24 mEq/L (ref 19–32)
CREATININE: 0.76 mg/dL (ref 0.50–1.35)
Calcium: 8.1 mg/dL — ABNORMAL LOW (ref 8.4–10.5)
Chloride: 100 mEq/L (ref 96–112)
GFR, EST NON AFRICAN AMERICAN: 81 mL/min — AB (ref >90)
Glucose, Bld: 122 mg/dL — ABNORMAL HIGH (ref 70–99)
Potassium: 3.9 mEq/L (ref 3.7–5.3)
Sodium: 136 mEq/L — ABNORMAL LOW (ref 137–147)

## 2013-08-22 LAB — CBC
HEMATOCRIT: 43.1 % (ref 39.0–52.0)
HEMOGLOBIN: 14.1 g/dL (ref 13.0–17.0)
MCH: 29.9 pg (ref 26.0–34.0)
MCHC: 32.7 g/dL (ref 30.0–36.0)
MCV: 91.3 fL (ref 78.0–100.0)
Platelets: 134 10*3/uL — ABNORMAL LOW (ref 150–400)
RBC: 4.72 MIL/uL (ref 4.22–5.81)
RDW: 14.3 % (ref 11.5–15.5)
WBC: 12.4 10*3/uL — AB (ref 4.0–10.5)

## 2013-08-22 LAB — CREATININE, SERUM
CREATININE: 0.77 mg/dL (ref 0.50–1.35)
GFR, EST NON AFRICAN AMERICAN: 80 mL/min — AB (ref >90)

## 2013-08-22 LAB — TYPE AND SCREEN
ABO/RH(D): B POS
ANTIBODY SCREEN: NEGATIVE

## 2013-08-22 LAB — ABO/RH: ABO/RH(D): B POS

## 2013-08-22 SURGERY — APPENDECTOMY, LAPAROSCOPIC
Anesthesia: General

## 2013-08-22 MED ORDER — LIDOCAINE HCL 2 % EX GEL
CUTANEOUS | Status: DC | PRN
  Administered 2013-08-22: 1

## 2013-08-22 MED ORDER — PROMETHAZINE HCL 25 MG/ML IJ SOLN: 6.2500 mg | INTRAMUSCULAR | Status: DC | PRN

## 2013-08-22 MED ORDER — FENTANYL CITRATE 0.05 MG/ML IJ SOLN
INTRAMUSCULAR | Status: AC
  Filled 2013-08-22: qty 2

## 2013-08-22 MED ORDER — GLYCOPYRROLATE 0.2 MG/ML IJ SOLN
INTRAMUSCULAR | Status: DC | PRN
  Administered 2013-08-22: 0.4 mg via INTRAVENOUS

## 2013-08-22 MED ORDER — OXYCODONE HCL 5 MG/5ML PO SOLN
5.0000 mg | Freq: Once | ORAL | Status: DC | PRN
  Filled 2013-08-22: qty 5

## 2013-08-22 MED ORDER — HYDROMORPHONE HCL PF 1 MG/ML IJ SOLN: 0.2500 mg | INTRAMUSCULAR | Status: DC | PRN

## 2013-08-22 MED ORDER — SUCCINYLCHOLINE CHLORIDE 20 MG/ML IJ SOLN
INTRAMUSCULAR | Status: DC | PRN
  Administered 2013-08-22: 100 mg via INTRAVENOUS

## 2013-08-22 MED ORDER — LACTATED RINGERS IV SOLN
INTRAVENOUS | Status: DC | PRN
  Administered 2013-08-22: 13:00:00 via INTRAVENOUS

## 2013-08-22 MED ORDER — LACTATED RINGERS IR SOLN
Status: DC | PRN
  Administered 2013-08-22: 1

## 2013-08-22 MED ORDER — BUPIVACAINE-EPINEPHRINE PF 0.25-1:200000 % IJ SOLN
INTRAMUSCULAR | Status: AC
  Filled 2013-08-22: qty 30

## 2013-08-22 MED ORDER — ONDANSETRON HCL 4 MG/2ML IJ SOLN
INTRAMUSCULAR | Status: AC
  Filled 2013-08-22: qty 2

## 2013-08-22 MED ORDER — LACTATED RINGERS IV SOLN
INTRAVENOUS | Status: DC
  Administered 2013-08-22: 13:00:00 via INTRAVENOUS

## 2013-08-22 MED ORDER — LIDOCAINE HCL (PF) 2 % IJ SOLN
INTRAMUSCULAR | Status: DC | PRN
  Administered 2013-08-22: 20 mg via INTRADERMAL

## 2013-08-22 MED ORDER — ROCURONIUM BROMIDE 100 MG/10ML IV SOLN
INTRAVENOUS | Status: AC
  Filled 2013-08-22: qty 1

## 2013-08-22 MED ORDER — ROCURONIUM BROMIDE 100 MG/10ML IV SOLN
INTRAVENOUS | Status: DC | PRN
  Administered 2013-08-22: 30 mg via INTRAVENOUS

## 2013-08-22 MED ORDER — PROPOFOL 10 MG/ML IV BOLUS
INTRAVENOUS | Status: AC
  Filled 2013-08-22: qty 20

## 2013-08-22 MED ORDER — OXYCODONE HCL 5 MG PO TABS: 5.0000 mg | ORAL_TABLET | Freq: Once | ORAL | Status: DC | PRN

## 2013-08-22 MED ORDER — HEPARIN SODIUM (PORCINE) 5000 UNIT/ML IJ SOLN
5000.0000 [IU] | Freq: Three times a day (TID) | INTRAMUSCULAR | Status: AC
  Administered 2013-08-22 – 2013-08-23 (×2): 5000 [IU] via SUBCUTANEOUS
  Filled 2013-08-22 (×5): qty 1

## 2013-08-22 MED ORDER — PROPOFOL 10 MG/ML IV BOLUS
INTRAVENOUS | Status: DC | PRN
  Administered 2013-08-22: 100 mg via INTRAVENOUS

## 2013-08-22 MED ORDER — 0.9 % SODIUM CHLORIDE (POUR BTL) OPTIME
TOPICAL | Status: DC | PRN
  Administered 2013-08-22: 1000 mL

## 2013-08-22 MED ORDER — BUPIVACAINE-EPINEPHRINE 0.25% -1:200000 IJ SOLN
INTRAMUSCULAR | Status: DC | PRN
  Administered 2013-08-22: 15 mL

## 2013-08-22 MED ORDER — MEPERIDINE HCL 50 MG/ML IJ SOLN: 6.2500 mg | INTRAMUSCULAR | Status: DC | PRN

## 2013-08-22 MED ORDER — NEOSTIGMINE METHYLSULFATE 1 MG/ML IJ SOLN
INTRAMUSCULAR | Status: DC | PRN
  Administered 2013-08-22: 3 mg via INTRAVENOUS

## 2013-08-22 MED ORDER — FENTANYL CITRATE 0.05 MG/ML IJ SOLN
INTRAMUSCULAR | Status: DC | PRN
  Administered 2013-08-22: 50 ug via INTRAVENOUS
  Administered 2013-08-22: 100 ug via INTRAVENOUS
  Administered 2013-08-22: 50 ug via INTRAVENOUS

## 2013-08-22 MED ORDER — ONDANSETRON HCL 4 MG/2ML IJ SOLN
INTRAMUSCULAR | Status: DC | PRN
Start: 2013-08-22 — End: 2013-08-22
  Administered 2013-08-22: 4 mg via INTRAVENOUS

## 2013-08-22 MED ORDER — LACTATED RINGERS IV SOLN: INTRAVENOUS | Status: DC

## 2013-08-22 MED ORDER — SODIUM CHLORIDE 0.9 % IV SOLN: 1.0000 g | INTRAVENOUS | Status: DC

## 2013-08-22 MED ORDER — SUCCINYLCHOLINE CHLORIDE 20 MG/ML IJ SOLN
INTRAMUSCULAR | Status: AC
  Filled 2013-08-22: qty 1

## 2013-08-22 SURGICAL SUPPLY — 41 items
APL SKNCLS STERI-STRIP NONHPOA (GAUZE/BANDAGES/DRESSINGS) ×1
APPLIER CLIP ROT 10 11.4 M/L (STAPLE)
APR CLP MED LRG 11.4X10 (STAPLE)
BAG SPEC RTRVL LRG 6X4 10 (ENDOMECHANICALS) ×1
BENZOIN TINCTURE PRP APPL 2/3 (GAUZE/BANDAGES/DRESSINGS) ×1 IMPLANT
CANISTER SUCTION 2500CC (MISCELLANEOUS) ×1 IMPLANT
CATH FOLEY 2WAY 5CC 16FR (CATHETERS) ×2
CATH URTH STD 16FR FL 2W DRN (CATHETERS) IMPLANT
CLIP APPLIE ROT 10 11.4 M/L (STAPLE) IMPLANT
CUTTER FLEX LINEAR 45M (STAPLE) ×1 IMPLANT
DECANTER SPIKE VIAL GLASS SM (MISCELLANEOUS) ×1 IMPLANT
DRAIN CHANNEL RND F F (WOUND CARE) ×1 IMPLANT
DRAPE LAPAROSCOPIC ABDOMINAL (DRAPES) ×2 IMPLANT
ELECT REM PT RETURN 9FT ADLT (ELECTROSURGICAL) ×2
ELECTRODE REM PT RTRN 9FT ADLT (ELECTROSURGICAL) ×1 IMPLANT
ENDOLOOP SUT PDS II  0 18 (SUTURE)
ENDOLOOP SUT PDS II 0 18 (SUTURE) IMPLANT
EVACUATOR SILICONE 100CC (DRAIN) ×1 IMPLANT
GLOVE BIOGEL M 8.0 STRL (GLOVE) ×2 IMPLANT
GOWN STRL REUS W/TWL XL LVL3 (GOWN DISPOSABLE) ×4 IMPLANT
KIT BASIN OR (CUSTOM PROCEDURE TRAY) ×2 IMPLANT
POUCH SPECIMEN RETRIEVAL 10MM (ENDOMECHANICALS) ×2 IMPLANT
RELOAD 45 VASCULAR/THIN (ENDOMECHANICALS) ×2 IMPLANT
RELOAD STAPLE 45 2.5 WHT GRN (ENDOMECHANICALS) IMPLANT
RELOAD STAPLE 45 3.5 BLU ETS (ENDOMECHANICALS) IMPLANT
RELOAD STAPLE TA45 3.5 REG BLU (ENDOMECHANICALS) IMPLANT
SCISSORS LAP 5X45 EPIX DISP (ENDOMECHANICALS) ×1 IMPLANT
SET IRRIG TUBING LAPAROSCOPIC (IRRIGATION / IRRIGATOR) ×2 IMPLANT
SHEARS CURVED HARMONIC AC 45CM (MISCELLANEOUS) ×2 IMPLANT
SOLUTION ANTI FOG 6CC (MISCELLANEOUS) ×2 IMPLANT
SPONGE DRAIN TRACH 4X4 STRL 2S (GAUZE/BANDAGES/DRESSINGS) ×1 IMPLANT
STRIP CLOSURE SKIN 1/2X4 (GAUZE/BANDAGES/DRESSINGS) ×2 IMPLANT
SUT ETHILON 3 0 PS 1 (SUTURE) ×1 IMPLANT
SUT VIC AB 4-0 SH 18 (SUTURE) ×2 IMPLANT
TAPE CLOTH SURG 4X10 WHT LF (GAUZE/BANDAGES/DRESSINGS) ×1 IMPLANT
TRAY FOLEY CATH 16FRSI W/METER (SET/KITS/TRAYS/PACK) ×1 IMPLANT
TRAY LAP CHOLE (CUSTOM PROCEDURE TRAY) ×2 IMPLANT
TROCAR BLADELESS OPT 5 100 (ENDOMECHANICALS) ×3 IMPLANT
TROCAR XCEL BLUNT TIP 100MML (ENDOMECHANICALS) ×2 IMPLANT
TROCAR XCEL NON-BLD 11X100MML (ENDOMECHANICALS) ×1 IMPLANT
TUBING INSUFFLATION 10FT LAP (TUBING) ×2 IMPLANT

## 2013-08-22 NOTE — Transfer of Care (Signed)
Immediate Anesthesia Transfer of Care Note  Patient: Justin Potter  Procedure(s) Performed: Procedure(s) (LRB): APPENDECTOMY LAPAROSCOPIC (N/A) Insertion of coude catheter (N/A)  Patient Location: PACU  Anesthesia Type: General  Level of Consciousness: sedated, patient cooperative and responds to stimulation  Airway & Oxygen Therapy: Patient Spontanous Breathing and Patient connected to face mask oxgen  Post-op Assessment: Report given to PACU RN and Post -op Vital signs reviewed and stable  Post vital signs: Reviewed and stable  Complications: No apparent anesthesia complications

## 2013-08-22 NOTE — Progress Notes (Signed)
Patient ID: Justin Potter, male   DOB: 1925/10/27, 80 y.o.   MRN: [id number]    Subjective: Pt feels ok today.  Pain is less except when you palpate.  Objective: Vital signs in last 24 hours: Temp:  [97.7 F (36.5 C)-98.4 F (36.9 C)] 97.7 F (36.5 C) (01/30 0831) Pulse Rate:  [52-64] 52 (01/30 0831) Resp:  [17-18] 18 (01/30 0448) BP: (110-132)/(53-62) 123/55 mmHg (01/30 0831) SpO2:  [96 %-98 %] 98 % (01/30 0831) Last BM Date: 08/19/13  Intake/Output from previous day: 01/29 0701 - 01/30 0700 In: 2380 [I.V.:2330; IV Piggyback:50] Out: 325 [Urine:325] Intake/Output this shift:    PE: Abd: soft, mild RLQ tenderness, +BS, ND Heart: regular Lungs: CTAB  Lab Results:   Recent Labs  08/21/13 0425 08/22/13 0355  WBC 17.4* 11.0*  HGB 13.0 13.3  HCT 40.0 41.0  PLT 164 151   BMET  Recent Labs  08/20/13 1607 08/22/13 0355  NA 137 136*  K 3.9 3.9  CL 96 100  CO2 23 24  GLUCOSE 148* 122*  BUN 12 12  CREATININE 0.82 0.76  CALCIUM 9.1 8.1*   PT/INR No results found for this basename: LABPROT, INR,  in the last 72 hours CMP     Component Value Date/Time   NA 136* 08/22/2013 0355   K 3.9 08/22/2013 0355   CL 100 08/22/2013 0355   CO2 24 08/22/2013 0355   GLUCOSE 122* 08/22/2013 0355   BUN 12 08/22/2013 0355   CREATININE 0.76 08/22/2013 0355   CALCIUM 8.1* 08/22/2013 0355   PROT 6.9 08/20/2013 1607   ALBUMIN 4.0 08/20/2013 1607   AST 16 08/20/2013 1607   ALT 14 08/20/2013 1607   ALKPHOS 74 08/20/2013 1607   BILITOT 1.6* 08/20/2013 1607   GFRNONAA 81* 08/22/2013 0355   GFRAA >90 08/22/2013 0355   Lipase  No results found for this basename: lipase       Studies/Results: Dg Chest 2 View  08/20/2013   CLINICAL DATA:  Shakiness.  Chills.  EXAM: CHEST  2 VIEW  COMPARISON:  Chest x-ray 02/12/2013.  FINDINGS: Lung volumes are normal. No consolidative airspace disease. No pleural effusions. No evidence of pulmonary edema. Mild cardiomegaly. Upper mediastinal contours are  within normal limits.  IMPRESSION: 1. No radiographic evidence of acute cardiopulmonary disease. 2. Mild cardiomegaly.   Electronically Signed   By: Vinnie Langton M.D.   On: 08/20/2013 16:52   Ct Abdomen Pelvis W Contrast  08/20/2013   CLINICAL DATA:  Lower abdominal pain with nausea.  EXAM: CT ABDOMEN AND PELVIS WITH CONTRAST  TECHNIQUE: Multidetector CT imaging of the abdomen and pelvis was performed using the standard protocol following bolus administration of intravenous contrast.  CONTRAST:  18mL OMNIPAQUE IOHEXOL 300 MG/ML SOLN, 187mL OMNIPAQUE IOHEXOL 300 MG/ML SOLN  COMPARISON:  None.  FINDINGS: Lung bases demonstrate a 7 mm nodule over the lateral left lower lobe. There is a 4 mm nodule opacity along the right major fissure. There is mild cardiomegaly and a small amount of pericardial fluid present.  Abdominal images demonstrate evidence of a prior cholecystectomy. The liver, spleen, pancreas and adrenal glands are within normal. Kidneys are normal in size without evidence of hydronephrosis or nephrolithiasis. The ureters are within normal. There is diverticulosis of the colon. Minimal calcified atherosclerotic plaque involving the abdominal aorta.  In the right lower quadrant, there is an inflamed appendix measuring 1.5 cm in diameter with mucosal enhancement and adjacent inflammation of the mesenteric fat and small  amount of free fluid in the right lower quadrant/ pelvis. There is no evidence of perforation or abscess.  Remaining pelvic images are notable for radiation seed implants over the prostate. There are mild degenerative changes of the spine and hips.  IMPRESSION: Evidence of acute appendicitis. No evidence of perforation or abscess.  Mild cardiomegaly and small amount of pericardial fluid.  Diverticulosis of the colon.  4 mm nodule along the right major fissure and a 7 mm nodule over the lateral left lower lobe.  If the patient is at high risk for bronchogenic carcinoma, follow-up chest CT  at 3-40months is recommended. If the patient is at low risk for bronchogenic carcinoma, follow-up chest CT at 6-12 months is recommended. This recommendation follows the consensus statement: Guidelines for Management of Small Pulmonary Nodules Detected on CT Scans: A Statement from the [place] as published in Radiology 2005; 237:395-400.  Critical Value/emergent results were called by telephone at the time of interpretation on 08/20/2013 at 7:05 PM to Dr. [name], [name] , who verbally acknowledged these results.   Electronically Signed   By: Marin Olp M.D.   On: 08/20/2013 19:06    Anti-infectives: Anti-infectives   Start     Dose/Rate Route Frequency Ordered Stop   08/20/13 2200  ertapenem (INVANZ) 1 g in sodium chloride 0.9 % 50 mL IVPB     1 g 100 mL/hr over 30 Minutes Intravenous Every 24 hours 08/20/13 2157     08/20/13 1800  oseltamivir (TAMIFLU) capsule 75 mg     75 mg Oral STAT 08/20/13 1748 08/20/13 1807       Assessment/Plan  1. Acute appendicitis  2. HTN 3. H/o a fib/bradycaria  Plan: 1. To OR today for appendectomy 2. D2 Invanz 3. Patient's questions were all answered.  He is agreeable with surgery.  LOS: 2 days    Etta Gassett E 08/22/2013, 8:53 AM Pager: [phone]

## 2013-08-22 NOTE — Anesthesia Postprocedure Evaluation (Signed)
Anesthesia Post Note  Patient: Justin Potter  Procedure(s) Performed: Procedure(s) (LRB): APPENDECTOMY LAPAROSCOPIC (N/A) Insertion of coude catheter (N/A)  Anesthesia type: General  Patient location: PACU  Post pain: Pain level controlled  Post assessment: Post-op Vital signs reviewed  Last Vitals: BP 132/65  Pulse 52  Temp(Src) 36.5 C (Oral)  Resp 16  Ht 6\' 2"  (1.88 m)  Wt 192 lb 14.4 oz (87.5 kg)  BMI 24.76 kg/m2  SpO2 97%  Post vital signs: Reviewed  Level of consciousness: sedated  Complications: No apparent anesthesia complications

## 2013-08-22 NOTE — Preoperative (Signed)
Beta Blockers   Reason not to administer Beta Blockers:Not Applicable 

## 2013-08-22 NOTE — Op Note (Signed)
Surgeon: Kaylyn Lim, MD, FACS  Asst:  none  Anes:  general  Procedure: Laparoscopic appendectomy, 93 JP in the right pelvis  Diagnosis: Appendiceal phlegmon  Complications: none  EBL:   15 cc  Description of Procedure:  Taken to OR 6 and given general.  Invanz already on board.  Access achieved with Hasson through the umbilicus;  Numerous adhesions in the right upper quadrant from open cholecystectomy.  Some taken down to enable right upper 5 mm trocar.  Another 5 mm in the left lower quadrant.  A walled off phlegmon was in the periappendiceal area.  This was taken down gently with sucker and a Prestige grasper.  Harmonic was used to mobilize cecum.  Base of appendix identified.  Stapled base and removed the rest with harmonic.  Irrigated.  Drain placed.  Umbilicus repaired with fig of 8 of 0 Vicryl.  4-0 vicryl in the skin.  JP secured with 4-0 nylon and marcaine injected in the 3 trocar sites.    Matt B. Hassell Done, [place], [facility], [place]

## 2013-08-22 NOTE — Progress Notes (Signed)
Lap appy today. Agree with above.

## 2013-08-22 NOTE — Anesthesia Preprocedure Evaluation (Addendum)
Anesthesia Evaluation  Patient identified by MRN, date of birth, ID band Patient awake  General Assessment Comment:1. History of  Arthritis V13.4 2. History of  Hypertension 401.9 3. History of  Macular Degeneration 362.50 4. History of  Prostate Cancer V10.46   Reviewed: Allergy & Precautions, H&P , NPO status , Patient's Chart, lab work & pertinent test results  Airway Mallampati: II TM Distance: >3 FB Neck ROM: Full    Dental no notable dental hx.    Pulmonary neg pulmonary ROS, former smoker,  breath sounds clear to auscultation  Pulmonary exam normal       Cardiovascular Exercise Tolerance: Good hypertension, Pt. on medications and Pt. on home beta blockers negative cardio ROS  + dysrhythmias Rhythm:Regular Rate:Normal  Recent diagnosis of Atrial Fib and bradycardia. Clearance by Dr. Tamala Julian. Propranolol stopped, and Eliquis started. Last dose Sunday.   Neuro/Psych PSYCHIATRIC DISORDERS Anxiety TIAnegative neurological ROS     GI/Hepatic negative GI ROS, Neg liver ROS,   Endo/Other  negative endocrine ROS  Renal/GU negative Renal ROS     Musculoskeletal negative musculoskeletal ROS (+)   Abdominal   Peds  Hematology negative hematology ROS (+)   Anesthesia Other Findings   Reproductive/Obstetrics                           Anesthesia Physical  Anesthesia Plan  ASA: III  Anesthesia Plan: General   Post-op Pain Management:    Induction: Intravenous and Rapid sequence  Airway Management Planned: Oral ETT  Additional Equipment:   Intra-op Plan:   Post-operative Plan: Extubation in OR  Informed Consent: I have reviewed the patients History and Physical, chart, labs and discussed the procedure including the risks, benefits and alternatives for the proposed anesthesia with the patient or authorized representative who has indicated his/her understanding and acceptance.   Dental  advisory given  Plan Discussed with: CRNA  Anesthesia Plan Comments:         Anesthesia Quick Evaluation

## 2013-08-22 NOTE — Progress Notes (Signed)
Received from PACU s/p post lap appendectomy, alert and oriented, no complaints of any pain or discomfort. Incision site dressing x2 dry and intact. JP drain  Intact draining with bloody output.

## 2013-08-23 LAB — CBC
HCT: 41.1 % (ref 39.0–52.0)
HEMOGLOBIN: 13.4 g/dL (ref 13.0–17.0)
MCH: 29.8 pg (ref 26.0–34.0)
MCHC: 32.6 g/dL (ref 30.0–36.0)
MCV: 91.5 fL (ref 78.0–100.0)
PLATELETS: 173 10*3/uL (ref 150–400)
RBC: 4.49 MIL/uL (ref 4.22–5.81)
RDW: 14.2 % (ref 11.5–15.5)
WBC: 9.5 10*3/uL (ref 4.0–10.5)

## 2013-08-23 LAB — MRSA CULTURE: Culture: NORMAL

## 2013-08-23 LAB — BASIC METABOLIC PANEL
BUN: 9 mg/dL (ref 6–23)
CALCIUM: 8.2 mg/dL — AB (ref 8.4–10.5)
CO2: 26 mEq/L (ref 19–32)
Chloride: 98 mEq/L (ref 96–112)
Creatinine, Ser: 0.88 mg/dL (ref 0.50–1.35)
GFR calc Af Amer: 88 mL/min — ABNORMAL LOW (ref >90)
GFR calc non Af Amer: 76 mL/min — ABNORMAL LOW (ref >90)
GLUCOSE: 116 mg/dL — AB (ref 70–99)
POTASSIUM: 4.1 meq/L (ref 3.7–5.3)
SODIUM: 135 meq/L — AB (ref 137–147)

## 2013-08-23 MED ORDER — APIXABAN 5 MG PO TABS
5.0000 mg | ORAL_TABLET | Freq: Two times a day (BID) | ORAL | Status: DC
  Administered 2013-08-23 – 2013-08-28 (×10): 5 mg via ORAL
  Filled 2013-08-23 (×11): qty 1

## 2013-08-23 NOTE — Progress Notes (Signed)
1 Day Post-Op  Subjective: Sore, but pain tolerable.  No n/v.  Some flatus.    Objective: Vital signs in last 24 hours: Temp:  [97.2 F (36.2 C)-99 F (37.2 C)] 98.3 F (36.8 C) (01/31 0602) Pulse Rate:  [51-77] 63 (01/31 0602) Resp:  [16-19] 19 (01/31 0602) BP: (120-169)/(56-75) 125/69 mmHg (01/31 0602) SpO2:  [76 %-100 %] 97 % (01/31 0602) Last BM Date: 08/19/13  Intake/Output from previous day: 01/30 0701 - 01/31 0700 In: 3400 [P.O.:30; I.V.:3320; IV Piggyback:50] Out: 1290 [Urine:965; Drains:325] Intake/Output this shift:    General appearance: alert, cooperative and no distress GI: soft, approp tender, sl distended.  drain serosang.  Lab Results:   Recent Labs  08/22/13 1810 08/23/13 0600  WBC 12.4* 9.5  HGB 14.1 13.4  HCT 43.1 41.1  PLT 134* 173   BMET  Recent Labs  08/22/13 0355 08/22/13 1810 08/23/13 0600  NA 136*  --  135*  K 3.9  --  4.1  CL 100  --  98  CO2 24  --  26  GLUCOSE 122*  --  116*  BUN 12  --  9  CREATININE 0.76 0.77 0.88  CALCIUM 8.1*  --  8.2*   PT/INR No results found for this basename: LABPROT, INR,  in the last 72 hours ABG No results found for this basename: PHART, PCO2, PO2, HCO3,  in the last 72 hours  Studies/Results: No results found.  Anti-infectives: Anti-infectives   Start     Dose/Rate Route Frequency Ordered Stop   08/22/13 1630  ertapenem (INVANZ) 1 g in sodium chloride 0.9 % 50 mL IVPB  Status:  Discontinued     1 g 100 mL/hr over 30 Minutes Intravenous Every 24 hours 08/22/13 1628 08/22/13 1636   08/20/13 2200  ertapenem (INVANZ) 1 g in sodium chloride 0.9 % 50 mL IVPB     1 g 100 mL/hr over 30 Minutes Intravenous Every 24 hours 08/20/13 2157     08/20/13 1800  oseltamivir (TAMIFLU) capsule 75 mg     75 mg Oral STAT 08/20/13 1748 08/20/13 1807      Assessment/Plan: s/p Procedure(s): APPENDECTOMY LAPAROSCOPIC (N/A) Insertion of coude catheter (N/A) Advance diet High risk for post op ileus given  dissection required and phlegmon present. Will def need to be here until tomorrow.   Continue antibiotics.   Decrease IVF Will discuss foley with Dr. Hassell Done.  Sounds like difficult foley in OR.  Will hopefully remove tomorrow AM.     LOS: 3 days    Justin Potter 08/23/2013

## 2013-08-24 MED ORDER — HYDROCODONE-ACETAMINOPHEN 5-500 MG PO TABS: 1.0000 | ORAL_TABLET | Freq: Four times a day (QID) | ORAL | Status: DC | PRN

## 2013-08-24 MED ORDER — AMOXICILLIN-POT CLAVULANATE 875-125 MG PO TABS: 1.0000 | ORAL_TABLET | Freq: Two times a day (BID) | ORAL | Status: DC

## 2013-08-24 NOTE — Progress Notes (Signed)
Pt was unable to void since foley was removed this afternoon. Bladder scanned pt, there was 367ml in bladder. Called MD received orders to place a new foley catherer. Pt insisted on trying to urinate again, pt was able to void 215ml. Scanned bladder again, there was a remaining 332ml.

## 2013-08-24 NOTE — Progress Notes (Signed)
Patient ID: Justin Potter, male   DOB: Aug 02, 1925, 80 y.o.   MRN: [id number] 2 Days Post-Op  Subjective: Still having some flatus.  No BM yet, but feels like "it's close."  Coude catheter reinserted due to retention.    Objective: Vital signs in last 24 hours: Temp:  [98.1 F (36.7 C)-98.2 F (36.8 C)] 98.2 F (36.8 C) (02/01 0506) Pulse Rate:  [60-82] 75 (02/01 0506) Resp:  [16-18] 18 (02/01 0506) BP: (111-140)/(59-85) 140/85 mmHg (02/01 0506) SpO2:  [96 %-98 %] 97 % (02/01 0506) Last BM Date: 09/19/13  Intake/Output from previous day: 01/31 0701 - 02/01 0700 In: 2537.5 [P.O.:960; I.V.:1577.5] Out: 1695 [Urine:1555; Drains:140] Intake/Output this shift: Total I/O In: -  Out: 90 [Drains:90]  General appearance: alert, cooperative and no distress GI: soft, approp tender, sl distended.  drain serosang.  Lab Results:   Recent Labs  08/22/13 1810 08/23/13 0600  WBC 12.4* 9.5  HGB 14.1 13.4  HCT 43.1 41.1  PLT 134* 173   BMET  Recent Labs  08/22/13 0355 08/22/13 1810 08/23/13 0600  NA 136*  --  135*  K 3.9  --  4.1  CL 100  --  98  CO2 24  --  26  GLUCOSE 122*  --  116*  BUN 12  --  9  CREATININE 0.76 0.77 0.88  CALCIUM 8.1*  --  8.2*   PT/INR No results found for this basename: LABPROT, INR,  in the last 72 hours ABG No results found for this basename: PHART, PCO2, PO2, HCO3,  in the last 72 hours  Studies/Results: No results found.  Anti-infectives: Anti-infectives   Start     Dose/Rate Route Frequency Ordered Stop   08/22/13 1630  ertapenem (INVANZ) 1 g in sodium chloride 0.9 % 50 mL IVPB  Status:  Discontinued     1 g 100 mL/hr over 30 Minutes Intravenous Every 24 hours 08/22/13 1628 08/22/13 1636   08/20/13 2200  ertapenem (INVANZ) 1 g in sodium chloride 0.9 % 50 mL IVPB     1 g 100 mL/hr over 30 Minutes Intravenous Every 24 hours 08/20/13 2157     08/20/13 1800  oseltamivir (TAMIFLU) capsule 75 mg     75 mg Oral STAT 08/20/13 1748 08/20/13  1807      Assessment/Plan: s/p Procedure(s): APPENDECTOMY LAPAROSCOPIC (N/A) Insertion of coude catheter (N/A) Advance diet High risk for post op ileus given dissection required and phlegmon present. Regular diet. Continue antibiotics.   Decrease IVF If able to tolerate diet, may be able to go home this PM with catheter.       LOS: 4 days    [facility] 08/24/2013

## 2013-08-24 NOTE — Discharge Instructions (Signed)
[facility]      [facility] °[phone] ° °ABDOMINAL SURGERY: POST OP INSTRUCTIONS ° °Always review your discharge instruction sheet given to you by the facility where your surgery was performed. ° °IF YOU HAVE DISABILITY OR FAMILY LEAVE FORMS, YOU MUST BRING THEM TO THE OFFICE FOR PROCESSING.  PLEASE DO NOT GIVE THEM TO YOUR DOCTOR. ° °1. A prescription for pain medication may be given to you upon discharge.  Take your pain medication as prescribed, if needed.  If narcotic pain medicine is not needed, then you may take acetaminophen (Tylenol) or ibuprofen (Advil) as needed. °2. Take your usually prescribed medications unless otherwise directed. °3. If you need a refill on your pain medication, please contact your pharmacy. They will contact our office to request authorization.  Prescriptions will not be filled after 5pm or on week-ends. °4. You should follow a light diet the first few days after arrival home, such as soup and crackers, pudding, etc.unless your doctor has advised otherwise. A high-fiber, low fat diet can be resumed as tolerated.   Be sure to include lots of fluids daily. Most patients will experience some swelling and bruising on the chest and neck area.  Ice packs will help.  Swelling and bruising can take several days to resolve °5. Most patients will experience some swelling and bruising in the area of the incision. Ice pack will help. Swelling and bruising can take several days to resolve..  °6. It is common to experience some constipation if taking pain medication after surgery.  Increasing fluid intake and taking a stool softener will usually help or prevent this problem from occurring.  A mild laxative (Milk of Magnesia or Miralax) should be taken according to package directions if there are no bowel movements after 48 hours. °7.  You may have steri-strips (small skin tapes) in place directly over the incision.  These strips should be left on the skin for 10-14 days.  If your  surgeon used skin glue on the incision, you may shower in 48 hours.  The glue will flake off over the next 2-3 weeks.  Any sutures or staples will be removed at the office during your follow-up visit. You may find that a light gauze bandage over your incision may keep your staples from being rubbed or pulled. You may shower and replace the bandage daily. °8. ACTIVITIES:  You may resume regular (light) daily activities beginning the next day--such as daily self-care, walking, climbing stairs--gradually increasing activities as tolerated.  You may have sexual intercourse when it is comfortable.  Refrain from any heavy lifting or straining until approved by your doctor. °a. You may drive when you no longer are taking prescription pain medication, you can comfortably wear a seatbelt, and you can safely maneuver your car and apply brakes °b. Return to Work: __________2 weeks if applicable_________________________ °9. You should see your doctor in the office for a follow-up appointment approximately two weeks after your surgery.  Make sure that you call for this appointment within a day or two after you arrive home to insure a convenient appointment time. °OTHER INSTRUCTIONS:  °_____________________________________________________________ °_____________________________________________________________ ° °WHEN TO CALL YOUR DOCTOR: °1. Fever over 101.0 °2. Inability to urinate °3. Nausea and/or vomiting °4. Extreme swelling or bruising °5. Continued bleeding from incision. °6. Increased pain, redness, or drainage from the incision. °7. Difficulty swallowing or breathing °8. Muscle cramping or spasms. °9. Numbness or tingling in hands or feet or around lips. ° °The clinic staff is   available to answer your questions during regular business hours.  Please don’t hesitate to call and ask to speak to one of the nurses if you have concerns. ° °For further questions, please visit [email or web address] ° ° ° °

## 2013-08-24 NOTE — Progress Notes (Signed)
Norval Gable reinserted 56F coude catheter, pt tolerated well. 137ml urine return. Will continue to monitor

## 2013-08-24 NOTE — Plan of Care (Signed)
Problem: Phase III Progression Outcomes Goal: Foley discontinued Outcome: Adequate for Discharge Patient will discharge home with foley catheter, due to retention. Will be followed outpatient.

## 2013-08-24 NOTE — Progress Notes (Signed)
Able to place 16 french Coude on first attempt, pt tolerated well, and received 100 cc's clear amber urine return.  Pt reports relief.  Will continue to monitor

## 2013-08-25 ENCOUNTER — Encounter (HOSPITAL_COMMUNITY): Payer: Self-pay | Admitting: Surgery

## 2013-08-25 DIAGNOSIS — N35919 Unspecified urethral stricture, male, unspecified site: Secondary | ICD-10-CM | POA: Diagnosis not present

## 2013-08-25 NOTE — Care Management Note (Addendum)
    Page 1 of 1   08/28/2013     2:38:02 PM   CARE MANAGEMENT NOTE 08/28/2013  Patient:  Justin Potter, Justin Potter   Account Number:  [id number]  Date Initiated:  08/25/2013  Documentation initiated by:  Dessa Phi  Subjective/Objective Assessment:   80 Y/O M ADMITTED W/ABD PAIN.     Action/Plan:   FROM [place].   Anticipated DC Date:  08/28/2013   Anticipated DC Plan:  [place]  CM consult      Choice offered to / List presented to:             Status of service:  Completed, signed off Medicare Important Message given?   (If response is "NO", the following Medicare IM given date fields will be blank) Date Medicare IM given:   Date Additional Medicare IM given:    Discharge Disposition:  [place]  Per UR Regulation:  Reviewed for med. necessity/level of care/duration of stay  If discussed at [place] of Stay Meetings, dates discussed:   08/26/2013  08/28/2013    Comments:  08/28/13 Jolynda Townley RN,BSN NCM [phone] D/C SNF.  08/27/13 Jacarri Gesner RN,BSN NCM [phone] POD#5,D/C JP,D/C F/C.MONITORING POST VOIDS,URINE CX.FOR D/C SNF IN AM IF STABLE.  08/25/13 Timoth Schara RN,BSN NCM [phone] POD#3 LAP APPY,JP DRAIN-10CC,IV ABX,F/C TO BE D/C.AWAIT PT RECOMMENDATIONS.FACILITY TEL#[phone] FAX#[phone] PROVIDES THEIR OWN [facility]PT[facility],WILL NEED TO FAX HHC ORDER IF NEEDED.

## 2013-08-25 NOTE — Op Note (Signed)
Preoperative diagnosis: History of urethral stricture with difficult Foley insertion Postoperative diagnosis: Same  Procedure: Insertion of difficult Foley catheter under anesthesia   Surgeon: Bernestine Amass M.D.  Anesthesia: Gen.  Indications: Justin Potter is undergoing a appendectomy. He has a history of urethral stricture with difficult Foley catheter insertion in the past. For that reason we've come to the operating room to place catheter after induction of anesthesia.     Technique and findings: Patient had successful induction of general anesthesia in anticipation of his appendectomy. He was prepped and draped in usual manner. Lidocaine jelly was instilled. A 16 French coud catheter was able to be placed with some difficulty. Several 100 cc of clear urine was obtained. There was no need for formal urethral dilation.

## 2013-08-25 NOTE — Progress Notes (Addendum)
Patient ID: Justin Potter, male   DOB: 12-16-1925, 80 y.o.   MRN: [id number] [facility] Progress Note:   3 Days Post-Op  Subjective: Mental status is clear Objective: Vital signs in last 24 hours: Temp:  [97.8 F (36.6 C)-98.4 F (36.9 C)] 98.2 F (36.8 C) (02/02 0447) Pulse Rate:  [73-84] 73 (02/02 0447) Resp:  [18-20] 18 (02/02 0447) BP: (118-129)/(59-81) 126/81 mmHg (02/02 0447) SpO2:  [97 %-100 %] 97 % (02/02 0447)  Intake/Output from previous day: 02/01 0701 - 02/02 0700 In: 2345.2 [P.O.:1560; I.V.:735.2; IV Piggyback:50] Out: 1210 [Urine:900; Drains:310] Intake/Output this shift: Total I/O In: -  Out: 10 [Drains:10]  Physical Exam: Work of breathing is normal.  Abdomen is nontender.  Foley is in place-Will discuss its removal with Dr. Risa Grill.    Lab Results:  No results found for this or any previous visit (from the past 48 hour(s)).  Radiology/Results: No results found.  Anti-infectives: Anti-infectives   Start     Dose/Rate Route Frequency Ordered Stop   08/24/13 0000  amoxicillin-clavulanate (AUGMENTIN) 875-125 MG per tablet     1 tablet Oral 2 times daily 08/24/13 0835     08/22/13 1630  ertapenem (INVANZ) 1 g in sodium chloride 0.9 % 50 mL IVPB  Status:  Discontinued     1 g 100 mL/hr over 30 Minutes Intravenous Every 24 hours 08/22/13 1628 08/22/13 1636   08/20/13 2200  ertapenem (INVANZ) 1 g in sodium chloride 0.9 % 50 mL IVPB     1 g 100 mL/hr over 30 Minutes Intravenous Every 24 hours 08/20/13 2157     08/20/13 1800  oseltamivir (TAMIFLU) capsule 75 mg     75 mg Oral STAT 08/20/13 1748 08/20/13 1807      Assessment/Plan: Problem List: Patient Active Problem List   Diagnosis Date Noted  . Appendicitis 08/20/2013  . Acute appendicitis 08/20/2013    Doing well thus far.  Not ready for discharge yet.  3 Days Post-Op    LOS: 5 days   Matt B. Hassell Done, MD, [facility] [phone]  beeper [phone]  08/25/2013 9:13 AM  Will try to discontinue catheter today.  Discussed with Dr. Risa Grill.

## 2013-08-26 NOTE — Progress Notes (Signed)
Patient ID: Justin Potter, male   DOB: Dec 06, 1925, 80 y.o.   MRN: [id number] [facility] Progress Note:   4 Days Post-Op  Subjective: Mental status is clear.  Up sitting on commode Objective: Vital signs in last 24 hours: Temp:  [97.3 F (36.3 C)-98.1 F (36.7 C)] 97.3 F (36.3 C) (02/03 0500) Pulse Rate:  [73-92] 79 (02/03 0500) Resp:  [18-20] 18 (02/03 0500) BP: (114-144)/(58-90) 144/90 mmHg (02/03 0500) SpO2:  [98 %-99 %] 99 % (02/03 0500)  Intake/Output from previous day: 02/02 0701 - 02/03 0700 In: 1170 [P.O.:960; I.V.:160; IV Piggyback:50] Out: 690 [Urine:300; Drains:390] Intake/Output this shift:    Physical Exam: Work of breathing is not labored.  Incisions covered.  JP serous-removed  Lab Results:  No results found for this or any previous visit (from the past 48 hour(s)).  Radiology/Results: No results found.  Anti-infectives: Anti-infectives   Start     Dose/Rate Route Frequency Ordered Stop   08/24/13 0000  amoxicillin-clavulanate (AUGMENTIN) 875-125 MG per tablet     1 tablet Oral 2 times daily 08/24/13 0835     08/22/13 1630  ertapenem (INVANZ) 1 g in sodium chloride 0.9 % 50 mL IVPB  Status:  Discontinued     1 g 100 mL/hr over 30 Minutes Intravenous Every 24 hours 08/22/13 1628 08/22/13 1636   08/20/13 2200  ertapenem (INVANZ) 1 g in sodium chloride 0.9 % 50 mL IVPB     1 g 100 mL/hr over 30 Minutes Intravenous Every 24 hours 08/20/13 2157     08/20/13 1800  oseltamivir (TAMIFLU) capsule 75 mg     75 mg Oral STAT 08/20/13 1748 08/20/13 1807      Assessment/Plan: Problem List: Patient Active Problem List   Diagnosis Date Noted  . Appendicitis 08/20/2013  . Acute appendicitis 08/20/2013    Path acute appendicitis.  JP out.  Foley still out and voiding trial in progress.  Observation for now.  4 Days Post-Op    LOS: 6 days   Matt B. Hassell Done, MD, [facility] [phone] beeper [phone]  08/26/2013  7:35 AM

## 2013-08-26 NOTE — Progress Notes (Addendum)
Unsure if pt is having decreased urine output. Per nurse tech, pt has urinated on bedside commode when he is also having a bowel movement. His bowel movements are clear, yellowish, green liquid and so it is difficult to determine what is urine and what is stool. Also per the nurse tech, the amounts have been small each time, but pt has gotten on the bedside commode several times today. Pt has been encouraged to drink fluids throughout the day. Bladder scan has been preformed twice by RN today at about 10am and 4pm. Both times there has been less than 100cc in the bladder, so it does not appear that pt is retaining urine at this time. MD at bedside and made aware of findings. No new orders at this time. Bladder scan prn and continue to monitor output and encourage po intake.  Justin Potter [facility] 08/26/2013

## 2013-08-26 NOTE — Progress Notes (Signed)
Pt has not voided sufficiently since foley catheter removal around 12:30pm yesterday (08/25/13).  Pt had a few small BM's that were loose and could not determine if urine was mixed with the stools.  Bladder scanned pt around 0045 which revealed 350cc urine in pt's bladder.  Pt denies urge to urinate. Pt was however able to urinate 100 cc urine in BSC.  Notified MD on call for Dr. Hassell Done.  Dr. Lucia Gaskins called back with orders to leave catheter out (since pt is urinating some and is A&Ox4) and to monitor pt throughout the night. Instructed to let daytime rounding MD to make the decision about whether or not to re-insert catheter.  Will continue to monitor pt.

## 2013-08-26 NOTE — Evaluation (Signed)
Physical Therapy Evaluation Patient Details Name: Justin Potter MRN: [id number] DOB: 1926/01/23 Today's Date: 08/26/2013 Time: 4098-1191 PT Time Calculation (min): 18 min  PT Assessment / Plan / Recommendation History of Present Illness  patient is a very pleasant 80 year old male resident of [place], very active and still driving, who presents with acute abdominal pain. This started gradually about 24-36 hours ago with some intermittent diffuse abdominal pain that gradually became worse last night and then this morning. It became significantly more intense later today and more localized in the lower right abdomen and he presented to the emergency room. He has had some mild nausea but no vomiting. No fever or chills. Normal bowel movement yesterday.  Clinical Impression  Pt will benefit from PT to address deficits below; He is doing better but is much weaker than his baseline where he was an I--mod I ambulator    PT Assessment  Patient needs continued PT services    Follow Up Recommendations  Home health PT    Does the patient have the potential to tolerate intense rehabilitation      Barriers to Discharge        Equipment Recommendations  None recommended by PT    Recommendations for Other Services     Frequency Min 3X/week    Precautions / Restrictions Restrictions Weight Bearing Restrictions: No   Pertinent Vitals/Pain C/o mild abd pain intermittently      Mobility  Transfers Overall transfer level: Needs assistance Equipment used: Rolling walker (2 wheeled) Transfers: Sit to/from Stand Sit to Stand: Min guard General transfer comment: initial cues for safe technique adn hand placement Ambulation/Gait Ambulation/Gait assistance: Min guard;Min assist Ambulation Distance (Feet): 180 Feet Assistive device: Rolling walker (2 wheeled) Gait Pattern/deviations: Step-through pattern;Decreased stride length;Trunk flexed Gait velocity: 0.83 Gait velocity interpretation:  <1.8 ft/sec, indicative of risk for recurrent falls General Gait Details: multi-modal cues for trunk extension, incr step length,  RW safety; occasional min assist for RW direction    Exercises     PT Diagnosis: Difficulty walking;Generalized weakness  PT Problem List: Decreased strength;Decreased activity tolerance;Decreased balance;Decreased mobility PT Treatment Interventions: DME instruction;Gait training;Functional mobility training;Therapeutic activities;Therapeutic exercise;Patient/family education;Balance training     PT Goals(Current goals can be found in the care plan section) Acute Rehab PT Goals Patient Stated Goal: get stronger PT Goal Formulation: With patient Time For Goal Achievement: 09/02/13  Visit Information  Last PT Received On: 08/26/13 Assistance Needed: +1 History of Present Illness: patient is a very pleasant 80 year old male resident of [place], very active and still driving, who presents with acute abdominal pain. This started gradually about 24-36 hours ago with some intermittent diffuse abdominal pain that gradually became worse last night and then this morning. It became significantly more intense later today and more localized in the lower right abdomen and he presented to the emergency room. He has had some mild nausea but no vomiting. No fever or chills. Normal bowel movement yesterday.       Prior [employer] expects to be discharged to:: Private residence Living Arrangements: Spouse/significant other Type of Home: [place]: One level Home Equipment: [place] - 4 wheels;Cane - single point;Electric scooter Additional Comments: [place] i living; uses cane at night Prior Function Level of Independence: Independent with assistive device(s) Comments: pt's wife is at [employer]; she is to return home possibly this weekend, the electirc scooter belongs to her Communication Communication: No difficulties     Cognition  Cognition Arousal/Alertness: Awake/alert Behavior  During Therapy: WFL for tasks assessed/performed Overall Cognitive Status: Within Functional Limits for tasks assessed    Extremity/Trunk Assessment Upper Extremity Assessment Upper Extremity Assessment: Generalized weakness (UE fatigue during amb) Lower Extremity Assessment Lower Extremity Assessment: Generalized weakness   Balance Balance Overall balance assessment: Needs assistance Sitting-balance support: Feet supported;No upper extremity supported Sitting balance-Leahy Scale: Good Standing balance-Leahy Scale: Fair  End of Session PT - End of Session Equipment Utilized During Treatment: Gait belt Activity Tolerance: Patient tolerated treatment well Patient left: in chair;with call bell/phone within reach  GP     [facility] 08/26/2013, 11:21 AM

## 2013-08-27 DIAGNOSIS — N35919 Unspecified urethral stricture, male, unspecified site: Secondary | ICD-10-CM | POA: Diagnosis not present

## 2013-08-27 LAB — BASIC METABOLIC PANEL
BUN: 15 mg/dL (ref 6–23)
CHLORIDE: 97 meq/L (ref 96–112)
CO2: 27 mEq/L (ref 19–32)
Calcium: 8.6 mg/dL (ref 8.4–10.5)
Creatinine, Ser: 0.83 mg/dL (ref 0.50–1.35)
GFR calc non Af Amer: 78 mL/min — ABNORMAL LOW (ref >90)
GLUCOSE: 112 mg/dL — AB (ref 70–99)
POTASSIUM: 3.2 meq/L — AB (ref 3.7–5.3)
SODIUM: 137 meq/L (ref 137–147)

## 2013-08-27 LAB — CBC
HEMATOCRIT: 45.3 % (ref 39.0–52.0)
HEMOGLOBIN: 15.5 g/dL (ref 13.0–17.0)
MCH: 30.2 pg (ref 26.0–34.0)
MCHC: 34.2 g/dL (ref 30.0–36.0)
MCV: 88.1 fL (ref 78.0–100.0)
Platelets: 313 10*3/uL (ref 150–400)
RBC: 5.14 MIL/uL (ref 4.22–5.81)
RDW: 13.8 % (ref 11.5–15.5)
WBC: 9.8 10*3/uL (ref 4.0–10.5)

## 2013-08-27 LAB — CLOSTRIDIUM DIFFICILE BY PCR: Toxigenic C. Difficile by PCR: NEGATIVE

## 2013-08-27 MED ORDER — RISAQUAD PO CAPS
1.0000 | ORAL_CAPSULE | Freq: Every day | ORAL | Status: DC
  Administered 2013-08-27 – 2013-08-28 (×2): 1 via ORAL
  Filled 2013-08-27 (×2): qty 1

## 2013-08-27 NOTE — Progress Notes (Signed)
Patient ID: Justin Potter, male   DOB: 1925-10-21, 80 y.o.   MRN: [id number] [facility] Progress Note:   5 Days Post-Op  Subjective: Mental status is clear.  Wants to go to [place] but unable to care for himself Objective: Vital signs in last 24 hours: Temp:  [97.4 F (36.3 C)-98.2 F (36.8 C)] 97.5 F (36.4 C) (02/04 0455) Pulse Rate:  [62-78] 67 (02/04 0657) Resp:  [18-20] 18 (02/04 0455) BP: (118-128)/(68-71) 119/68 mmHg (02/04 0657) SpO2:  [95 %-98 %] 95 % (02/04 0657)  Intake/Output from previous day: 02/03 0701 - 02/04 0700 In: 57 [P.O.:600; I.V.:240; IV Piggyback:50] Out: 40 [Drains:40] Intake/Output this shift:    Physical Exam: Work of breathing is normal.  Incisions OK -some serous drainage from JP site  Lab Results:  No results found for this or any previous visit (from the past 48 hour(s)).  Radiology/Results: No results found.  Anti-infectives: Anti-infectives   Start     Dose/Rate Route Frequency Ordered Stop   08/24/13 0000  amoxicillin-clavulanate (AUGMENTIN) 875-125 MG per tablet     1 tablet Oral 2 times daily 08/24/13 0835     08/22/13 1630  ertapenem (INVANZ) 1 g in sodium chloride 0.9 % 50 mL IVPB  Status:  Discontinued     1 g 100 mL/hr over 30 Minutes Intravenous Every 24 hours 08/22/13 1628 08/22/13 1636   08/20/13 2200  ertapenem (INVANZ) 1 g in sodium chloride 0.9 % 50 mL IVPB     1 g 100 mL/hr over 30 Minutes Intravenous Every 24 hours 08/20/13 2157     08/20/13 1800  oseltamivir (TAMIFLU) capsule 75 mg     75 mg Oral STAT 08/20/13 1748 08/20/13 1807      Assessment/Plan: Problem List: Patient Active Problem List   Diagnosis Date Noted  . Appendicitis 08/20/2013  . Acute appendicitis 08/20/2013    Voiding satisfactorily.  Will add probiotics.  Plan to send to rehab facility--tomorrow or Friday.  Check lab today 5 Days Post-Op    LOS: 7 days   Matt B. Hassell Done, MD, [facility] [phone]  beeper [phone]  08/27/2013 8:30 AM

## 2013-08-27 NOTE — Progress Notes (Signed)
Pt bladder scanned with results showing 190cc. Pt assisted to [facility]. Pt states he is unable to tell if he is urinating or having a bowel movement. Pt bladder scanned post void and 94cc resulted. Contents in the [facility] was yellow and watery. Small amount of yellow watery stool wiped from rectum.

## 2013-08-27 NOTE — Clinical Social Work Psychosocial (Signed)
     Clinical Social Work Department BRIEF PSYCHOSOCIAL ASSESSMENT 08/27/2013  Patient:  Justin Potter, Justin Potter     Account Number:  [id number]     Admit date:  08/20/2013  Clinical Social Worker:  Venia Minks  Date/Time:  08/27/2013 12:00 M  Referred by:  Physician  Date Referred:  08/27/2013 Referred for  SNF Placement   Other Referral:   Interview type:  Patient Other interview type:   son at bedside    PSYCHOSOCIAL DATA Living Status:  FAMILY Admitted from facility:   Level of care:   Primary support name:  Bhavesh Vazquez. MD Primary support relationship to patient:  CHILD, ADULT Degree of support available:   excellent    CURRENT CONCERNS Current Concerns  Post-Acute Placement   Other Concerns:    SOCIAL WORK ASSESSMENT / PLAN CSW met with patient and patients son, Dr. Valere Dross, at bedside. Patient is alert and oriented X3. Dr. Valere Dross is concerned that patient needs a transition at snf prior to going home. He states that patient was independent prior to coming into the hospital and was driving around and taking care of himself. He is interested in snf at [place]. CSW explained medicare regulations and coverage. Patients wife is currently at [facility] and they havent had a great experience there.   Assessment/plan status:   Other assessment/ plan:   Information/referral to community resources:    PATIENTS/FAMILYS RESPONSE TO PLAN OF CARE: Patient is agreeable to short term snf. He is hopeful that in a short period of time that he will be able to return home. He is very motivated to get home quickly as his wife should shortly be released from [facility].

## 2013-08-27 NOTE — Progress Notes (Signed)
Pt requiring assistance to get to the [facility] multiple times during the night to have a bowel movement. Stool is yellow, watery/mucous like with small amount of particles noted. No odor noted at this time.Pt often gets on the [facility], assisted back to bed and then back to the [facility] before getting settled in the bed. Pt denies any abdominal pain or discomfort at this time. Will continue to monitor and notify MD if frequent BMs persist.

## 2013-08-27 NOTE — Progress Notes (Signed)
Pt has voided small amount several times today. Bladder scan performed and pt found to have 95cc in bladder at this time. Will continue to monitor output.   Justin Potter [facility] 08/27/2013

## 2013-08-27 NOTE — Clinical Social Work Placement (Signed)
     Clinical Social Work Department CLINICAL SOCIAL WORK PLACEMENT NOTE 08/27/2013  Patient:  Justin Potter, Justin Potter  Account Number:  [id number] Admit date:  08/20/2013  Clinical Social Worker:  Caren Hazy, LCSW  Date/time:  08/27/2013 12:00 M  Clinical Social Work is seeking post-discharge placement for this patient at the following level of care:   [place]   (*CSW will update this form in Epic as items are completed)   08/27/2013  Patient/family provided with [place] Department of Clinical Social Works list of facilities offering this level of care within the geographic area requested by the patient (or if unable, by the patients family).  08/27/2013  Patient/family informed of their freedom to choose among providers that offer the needed level of care, that participate in Medicare, Medicaid or managed care program needed by the patient, have an available bed and are willing to accept the patient.  08/27/2013  Patient/family informed of [facility] ownership interest in [facility], as well as of the fact that they are under no obligation to receive care at this facility.  PASARR submitted to EDS on 08/27/2013 PASARR number received from EDS on 08/27/2013  FL2 transmitted to all facilities in geographic area requested by pt/family on  08/27/2013 FL2 transmitted to all facilities within larger geographic area on   Patient informed that his/her managed care company has contracts with or will negotiate with  certain facilities, including the following:     Patient/family informed of bed offers received:  08/27/2013 Patient chooses bed at [facility] Physician recommends and patient chooses bed at    Patient to be transferred to  on   Patient to be transferred to facility by   The following physician request were entered in Epic:   Additional Comments:

## 2013-08-27 NOTE — Progress Notes (Signed)
Physical Therapy Treatment Patient Details Name: Justin Potter MRN: [id number] DOB: Mar 07, 1926 Today's Date: 08/27/2013 Time: 2992-4268 PT Time Calculation (min): 28 min  PT Assessment / Plan / Recommendation  History of Present Illness patient is a very pleasant 80 year old male resident of [place], very active and still driving, who presents with acute abdominal pain. This started gradually about 24-36 hours ago with some intermittent diffuse abdominal pain that gradually became worse last night and then this morning. It became significantly more intense later today and more localized in the lower right abdomen and he presented to the emergency room. He has had some mild nausea but no vomiting. No fever or chills. Normal bowel movement yesterday.   PT Comments   Pt mobilizing well and able to ambulate entire hallway with RW and cues for posture.  Pt does present with difficulty with sit to stands and agreeable to call for assist when needing to perform any mobility for safety.   Follow Up Recommendations  Home health PT;Supervision for mobility/OOB     Does the patient have the potential to tolerate intense rehabilitation     Barriers to Discharge        Equipment Recommendations  None recommended by PT    Recommendations for Other Services    Frequency Min 3X/week   Progress towards PT Goals Progress towards PT goals: Progressing toward goals  Plan Current plan remains appropriate    Precautions / Restrictions Precautions Precautions: Fall   Pertinent Vitals/Pain Denies pain    Mobility  Transfers Overall transfer level: Needs assistance Equipment used: Rolling walker (2 wheeled) Transfers: Sit to/from Stand Sit to Stand: Min assist General transfer comment: performed a few times for [facility] (states had a false alarm and then a little loose BM), assist to rise with cues to use UEs to assist from armrests Ambulation/Gait Ambulation/Gait assistance: Min guard Ambulation  Distance (Feet): 400 Feet Assistive device: Rolling walker (2 wheeled) Gait Pattern/deviations: Step-through pattern;Decreased stride length;Trunk flexed General Gait Details: verbal cues for trunk/hip extension, avoiding obstacles    Exercises General Exercises - Lower Extremity Ankle Circles/Pumps: AROM;Both;20 reps Long Arc Quad: AROM;Both;15 reps;Seated Hip Flexion/Marching: AROM;Seated;Both;15 reps   PT Diagnosis:    PT Problem List:   PT Treatment Interventions:     PT Goals (current goals can now be found in the care plan section)    Visit Information  Last PT Received On: 08/27/13 Assistance Needed: +1 History of Present Illness: patient is a very pleasant 80 year old male resident of [place], very active and still driving, who presents with acute abdominal pain. This started gradually about 24-36 hours ago with some intermittent diffuse abdominal pain that gradually became worse last night and then this morning. It became significantly more intense later today and more localized in the lower right abdomen and he presented to the emergency room. He has had some mild nausea but no vomiting. No fever or chills. Normal bowel movement yesterday.    Subjective Data      Cognition  Cognition Arousal/Alertness: Awake/alert Behavior During Therapy: WFL for tasks assessed/performed Overall Cognitive Status: Within Functional Limits for tasks assessed    Balance     End of Session PT - End of Session Activity Tolerance: Patient tolerated treatment well   GP     [place]area Reasner,KATHrine E 08/27/2013, 2:51 PM Carmelia Bake, PT, DPT 08/27/2013 Pager: [phone]

## 2013-08-27 NOTE — Progress Notes (Signed)
5 Days Post-Op Subjective: Patient reports some ongoing weakness. Not much the way of any abdominal discomfort. He has been voiding. Nurses report urine output intermixed with bowel movements and some episodes of incontinence. Difficult to determine exact urine output totals. Post void residual is 200 cc by bladder scan and then down to less than 100 after a second void.  Objective: Vital signs in last 24 hours: Temp:  [97.4 F (36.3 C)-98.2 F (36.8 C)] 97.5 F (36.4 C) (02/04 0455) Pulse Rate:  [62-78] 67 (02/04 0657) Resp:  [18-20] 18 (02/04 0455) BP: (118-128)/(68-71) 119/68 mmHg (02/04 0657) SpO2:  [95 %-98 %] 95 % (02/04 0657)  Intake/Output from previous day: 02/03 0701 - 02/04 0700 In: 61 [P.O.:600; I.V.:240; IV Piggyback:50] Out: 40 [Drains:40] Intake/Output this shift:    Physical Exam:  Constitutional: Vital signs reviewed. WD WN in NAD   Eyes: PERRL, No scleral icterus.   Cardiovascular: RRR Pulmonary/Chest: Normal effort Genitourinary: Normal external to Extremities: No cyanosis or edema   Lab Results:  Recent Labs  08/27/13 0845  HGB 15.5  HCT 45.3   BMET  Recent Labs  08/27/13 0845  NA 137  K 3.2*  CL 97  CO2 27  GLUCOSE 112*  BUN 15  CREATININE 0.83  CALCIUM 8.6   No results found for this basename: LABPT, INR,  in the last 72 hours No results found for this basename: LABURIN,  in the last 72 hours Results for orders placed during the hospital encounter of 08/20/13  MRSA CULTURE     Status: None   Collection Time    08/20/13  3:15 PM      Result Value Range Status   Specimen Description NASOPHARYNGEAL   Final   Special Requests NONE   Final   Culture     Final   Value: NORMAL NASOPHARYNGEAL FLORA     Performed at [facility]   Report Status 08/23/2013 FINAL   Final  SURGICAL PCR SCREEN     Status: Abnormal   Collection Time    08/20/13 10:33 PM      Result Value Range Status   MRSA, PCR INVALID RESULTS, SPECIMEN SENT FOR  CULTURE (*) NEGATIVE Final   Comment: CRITICAL RESULT CALLED TO, READ BACK BY AND VERIFIED WITH:     SPOKE WITH ASLRO,J RN 0400 [id number] HAMER,N   Staphylococcus aureus INVALID RESULTS, SPECIMEN SENT FOR CULTURE (*) NEGATIVE Final   Comment: CRITICAL RESULT CALLED TO, READ BACK BY AND VERIFIED WITH:     SPOKE WITH ASLRO,J RN 0400 [id number] HAMER,N                The Xpert SA Assay (FDA     approved for NASAL specimens     in patients over 37 years of age),     is one component of     a comprehensive surveillance     program.  Test performance has     been validated by [employer] for patients greater     than or equal to 48 year old.     It is not intended     to diagnose infection nor to     guide or monitor treatment.    Studies/Results: No results found.  Assessment/Plan:  History of urethral stricture disease status post radiation therapy for prostate cancer. He does have a prior history of urinary retention. He has been voiding with acceptable post void residuals.  We'll make sure that  he does have a urine culture prior to discharge. We'll continue to monitor postvoid residuals by bladder scan.   LOS: 7 days   Brittie Whisnant S 08/27/2013, 11:23 AM

## 2013-08-27 NOTE — Progress Notes (Addendum)
08/27/13 0657  Vitals  BP 119/68 mmHg  BP Location Right arm  BP Method Automatic  Patient Position, if appropriate Lying  Pulse Rate 67  Pulse Rate Source Dinamap  Oxygen Therapy  SpO2 95 %  O2 Device None (Room air)  Notified by Arrie Aran, RN at [phone] that pt had a 2.07sec pause. Upon entering room pt was sleeping. Pt had no complaints at this time. Will notify rounding MD.

## 2013-08-28 ENCOUNTER — Non-Acute Institutional Stay (SKILLED_NURSING_FACILITY): Payer: Medicare Other | Admitting: Internal Medicine

## 2013-08-28 ENCOUNTER — Encounter: Payer: Self-pay | Admitting: Internal Medicine

## 2013-08-28 DIAGNOSIS — E785 Hyperlipidemia, unspecified: Secondary | ICD-10-CM | POA: Diagnosis not present

## 2013-08-28 DIAGNOSIS — I10 Essential (primary) hypertension: Secondary | ICD-10-CM | POA: Diagnosis not present

## 2013-08-28 DIAGNOSIS — Z9889 Other specified postprocedural states: Secondary | ICD-10-CM | POA: Diagnosis not present

## 2013-08-28 DIAGNOSIS — Z7901 Long term (current) use of anticoagulants: Secondary | ICD-10-CM | POA: Diagnosis not present

## 2013-08-28 DIAGNOSIS — Z9049 Acquired absence of other specified parts of digestive tract: Secondary | ICD-10-CM

## 2013-08-28 DIAGNOSIS — M6281 Muscle weakness (generalized): Secondary | ICD-10-CM | POA: Diagnosis not present

## 2013-08-28 DIAGNOSIS — Z48815 Encounter for surgical aftercare following surgery on the digestive system: Secondary | ICD-10-CM | POA: Diagnosis not present

## 2013-08-28 DIAGNOSIS — Z5189 Encounter for other specified aftercare: Secondary | ICD-10-CM | POA: Diagnosis not present

## 2013-08-28 DIAGNOSIS — I4891 Unspecified atrial fibrillation: Secondary | ICD-10-CM | POA: Diagnosis not present

## 2013-08-28 DIAGNOSIS — I4819 Other persistent atrial fibrillation: Secondary | ICD-10-CM | POA: Insufficient documentation

## 2013-08-28 DIAGNOSIS — H3530 Unspecified macular degeneration: Secondary | ICD-10-CM | POA: Diagnosis not present

## 2013-08-28 DIAGNOSIS — Z8673 Personal history of transient ischemic attack (TIA), and cerebral infarction without residual deficits: Secondary | ICD-10-CM | POA: Diagnosis not present

## 2013-08-28 DIAGNOSIS — R262 Difficulty in walking, not elsewhere classified: Secondary | ICD-10-CM | POA: Diagnosis not present

## 2013-08-28 DIAGNOSIS — K37 Unspecified appendicitis: Secondary | ICD-10-CM | POA: Diagnosis not present

## 2013-08-28 LAB — URINE CULTURE
Colony Count: NO GROWTH
Culture: NO GROWTH
Special Requests: NORMAL

## 2013-08-28 MED ORDER — RISAQUAD PO CAPS: 1.0000 | ORAL_CAPSULE | Freq: Every day | ORAL | Status: DC

## 2013-08-28 NOTE — Progress Notes (Signed)
Called report to [place], Therapist, sports at [facility].

## 2013-08-28 NOTE — Assessment & Plan Note (Signed)
Controlled on norvasc 5 mg

## 2013-08-28 NOTE — Assessment & Plan Note (Signed)
Was held for surgery;on it now again for prophylaxis

## 2013-08-28 NOTE — Progress Notes (Addendum)
Patient cleared for discharge. Packet copied and placed in [place]. Waiting for MD to fax back signed FL2. CSW spoke with patient's son, Dr. Arloa Koh, he is agreeable to transfer to [facility] and is requesting ptar. He understands that there is no guarantee of payment.  Quincee Gittens C. [place] MSW, [facility] signed. Ptar called for transportation. Patient aware and agreeable.  Symir Mah C. [place] MSW, [place]

## 2013-08-28 NOTE — Progress Notes (Signed)
Physical Therapy Treatment Patient Details Name: CAYLEN YARDLEY MRN: [id number] DOB: 1925-11-06 Today's Date: 08/28/2013 Time: 1020-1045 PT Time Calculation (min): 25 min  PT Assessment / Plan / Recommendation  History of Present Illness patient is a very pleasant 80 year old male resident of [place], very active and still driving, who presents with acute abdominal pain. This started gradually about 24-36 hours ago with some intermittent diffuse abdominal pain that gradually became worse last night and then this morning. It became significantly more intense later today and more localized in the lower right abdomen and he presented to the emergency room. He has had some mild nausea but no vomiting. No fever or chills. Normal bowel movement yesterday.   PT Comments   Assisted pt out of recliner to amb to BR then amb in hallway.  Pt plans to D/C back to [place].  Follow Up Recommendations  Home health PT;Supervision for mobility/OOB     Does the patient have the potential to tolerate intense rehabilitation     Barriers to Discharge        Equipment Recommendations  None recommended by PT    Recommendations for Other Services    Frequency Min 3X/week   Progress towards PT Goals Progress towards PT goals: Progressing toward goals  Plan Current plan remains appropriate    Precautions / Restrictions Precautions Precautions: Fall Restrictions Weight Bearing Restrictions: No   Pertinent Vitals/Pain     Mobility  Transfers Overall transfer level: Needs assistance Equipment used: Rolling walker (2 wheeled) Transfers: Sit to/from Stand Sit to Stand: Min assist General transfer comment: increased time and one VC safety with turns using RW Ambulation/Gait Ambulation Distance (Feet): 225 Feet Assistive device: Rolling walker (2 wheeled) Gait Pattern/deviations: Step-through pattern;Decreased stride length;Trunk flexed Gait velocity: decreased General Gait Details: 25% VC's  safety negociating around obsticles and turn completion with RW     PT Goals (current goals can now be found in the care plan section)    Visit Information  Last PT Received On: 08/28/13 History of Present Illness: patient is a very pleasant 80 year old male resident of [place], very active and still driving, who presents with acute abdominal pain. This started gradually about 24-36 hours ago with some intermittent diffuse abdominal pain that gradually became worse last night and then this morning. It became significantly more intense later today and more localized in the lower right abdomen and he presented to the emergency room. He has had some mild nausea but no vomiting. No fever or chills. Normal bowel movement yesterday.    Subjective Data      Cognition       Balance     End of Session PT - End of Session Equipment Utilized During Treatment: Gait belt Activity Tolerance: Patient tolerated treatment well Patient left: in chair;with call bell/phone within reach   Rica Koyanagi  PTA [facility]  Acute  Rehab Pager      [phone]

## 2013-08-28 NOTE — Progress Notes (Signed)
Clinical Social Work Department CLINICAL SOCIAL WORK PLACEMENT NOTE 08/28/2013  Patient:  Justin Potter, Justin Potter  Account Number:  [id number] Admit date:  08/20/2013  Clinical Social Worker:  Caren Hazy, LCSW  Date/time:  08/27/2013 12:00 M  Clinical Social Work is seeking post-discharge placement for this patient at the following level of care:   [place]   (*CSW will update this form in Epic as items are completed)   08/27/2013  Patient/family provided with [place] Department of Clinical Social Work's list of facilities offering this level of care within the geographic area requested by the patient (or if unable, by the patient's family).  08/27/2013  Patient/family informed of their freedom to choose among providers that offer the needed level of care, that participate in Medicare, Medicaid or managed care program needed by the patient, have an available bed and are willing to accept the patient.  08/27/2013  Patient/family informed of [facility]' ownership interest in [facility], as well as of the fact that they are under no obligation to receive care at this facility.  PASARR submitted to EDS on 08/27/2013 PASARR number received from EDS on 08/27/2013  FL2 transmitted to all facilities in geographic area requested by pt/family on  08/27/2013 FL2 transmitted to all facilities within larger geographic area on   Patient informed that his/her managed care company has contracts with or will negotiate with  certain facilities, including the following:     Patient/family informed of bed offers received:  08/27/2013 Patient chooses bed at [facility] Physician recommends and patient chooses bed at    [facility]  08/28/2013 Patient to be transferred to facility by ptar  The following physician request were entered in Epic:   Additional Comments:

## 2013-08-28 NOTE — Assessment & Plan Note (Addendum)
On lipitor 10 mg; will continue

## 2013-08-28 NOTE — Assessment & Plan Note (Signed)
No other known;was on ertapenam prior to surg;assume pt is on augmentin sec to surgery findings

## 2013-08-28 NOTE — Discharge Summary (Signed)
Physician Discharge Summary  Patient ID: Justin Potter MRN: [id number] DOB/AGE: 1926-02-04 80 y.o.  Admit date: 08/20/2013 Discharge date: 08/28/2013  Admission Diagnoses:  Acute appendicitis  Discharge Diagnoses:  same  Active Problems:   S/P laparoscopic appendectomy Jan 2015   Surgery:  Laparoscopic appendectomy  Discharged Condition: improved  Hospital Course:   Had surgery after initial delay to allow anticoagulant to normalize  Consults: urology  Significant Diagnostic Studies: CT scan    Discharge Exam: Blood pressure 110/71, pulse 61, temperature 97.5 F (36.4 C), temperature source Oral, resp. rate 18, height 6\' 2"  (1.88 m), weight 192 lb 14.4 oz (87.5 kg), SpO2 98.00%. Incisions OK.  Voiding better.    Disposition: 01-Home or Self Care  Discharge Orders   Future Appointments Provider Department Dept Phone   09/19/2013 10:30 AM [facility] [place] Office [phone]   09/19/2013 3:15 PM [name], MD [facility] [phone]   Future Orders Complete By Expires   Call MD for:  persistant nausea and vomiting  As directed    Call MD for:  redness, tenderness, or signs of infection (pain, swelling, redness, odor or green/yellow discharge around incision site)  As directed    Call MD for:  temperature >100.4  As directed    Diet - low sodium heart healthy  As directed    Discharge wound care:  As directed    Comments:     Apply dry dressing to right upper quadrant drain site as needed.  Discontinue when not draining.   Increase activity slowly  As directed        Medication List         acidophilus Caps capsule  Take 1 capsule by mouth daily.     amLODipine 5 MG tablet  Commonly known as:  NORVASC  Take 5 mg by mouth every evening.     amoxicillin-clavulanate 875-125 MG per tablet  Commonly known as:  AUGMENTIN  Take 1 tablet by mouth 2 (two) times daily.     atorvastatin 10 MG tablet  Commonly known as:   LIPITOR  Take 10 mg by mouth every evening.     clonazePAM 0.5 MG tablet  Commonly known as:  KLONOPIN  Take 0.5 mg by mouth 2 (two) times daily as needed for anxiety.     docusate sodium 100 MG capsule  Commonly known as:  COLACE  Take 100 mg by mouth 2 (two) times daily.     ELIQUIS 5 MG Tabs tablet  Generic drug:  apixaban  Take 5 mg by mouth 2 (two) times daily.     HYDROcodone-acetaminophen 5-500 MG per tablet  Commonly known as:  LORTAB 5  Take 1 tablet by mouth every 6 (six) hours as needed for pain.     nabumetone 500 MG tablet  Commonly known as:  RELAFEN  Take 500 mg by mouth 2 (two) times daily.     OCUVITE PRESERVISION PO  Take 1 tablet by mouth every morning.           Follow-up Information   Follow up with [name], MD In 3 weeks.   Specialty:  General Surgery   Contact information:   [address] [place] [address] [phone]       Signed: Pedro Earls 08/28/2013, 8:38 AM

## 2013-08-28 NOTE — Progress Notes (Signed)
MRN: [id number] Name: Justin Potter  Sex: male Age: 80 y.o. DOB: [birth year]  [place] #: [facility] Facility/Room: 215 Level Of Care: SNF Provider: Inocencio Homes D Emergency Contacts: Extended Emergency Contact Information Primary Emergency Contact: [place],Jean Address: [address] [place]          [place], [place] [address] [place] Phone: [phone] Relation: Spouse Secondary Emergency Contact: Shelly Flatten [place] Phone: [phone] Relation: Son  Code Status:   Allergies: Review of patient's allergies indicates no known allergies.  Chief Complaint  Patient presents with  . nursing home admission    HPI: Patient is 80 y.o. male who is s/p laproscopic surgery for acute appendicitis who is here for OT/PT for post op debility.  Past Medical History  Diagnosis Date  . History of TIA (transient ischemic attack)     probable tia no residual  09-09-2012  . Gross hematuria   . Bladder neck contracture   . Urinary retention   . History of prostate cancer     S/P RADIOACTIVE SEED IMPLANTS  . Urethral stricture   . History of gout   . Macular degeneration     both eyes  . History of squamous cell carcinoma of skin     EXCISION LOWER LIP AND RIGHT EAR  . Dysrhythmia     A-FIB / HX BRADYCARDIA - FOLLOWED BY DR. [place]  . Anxiety   . Arthritis     "in the fingers" per pt  . Hyperlipidemia   . Hypertension   . Atrial fibrillation     Past Surgical History  Procedure Laterality Date  . Radioactive prostate seed implants  1998  . Total knee arthroplasty Right 11-23-2004  . Tonsillectomy  as child  . Cholecystectomy  1977  . Cataract extraction w/ intraocular lens  implant, bilateral    . Cystoscopy with urethral dilatation N/A 02/12/2013    Procedure: CYSTOSCOPY WITH BALLOON DILATATION OF URETHRAL STRICTURE AND BLADDER FULGERATION;  Surgeon: Bernestine Amass, MD;  Location: [facility];   Service: Urology;  Laterality: N/A;  . Laparoscopic appendectomy N/A 08/22/2013    Procedure: APPENDECTOMY LAPAROSCOPIC;  Surgeon: Pedro Earls, MD;  Location: [facility];  Service: General;  Laterality: N/A;      Medication List    Notice   This visit is during an admission. Changes to the med list made in this visit will be reflected in the After Visit Summary of the admission.      No orders of the defined types were placed in this encounter.     There is no immunization history on file for this patient.  History  Substance Use Topics  . Smoking status: Former Smoker -- 1.50 packs/day for 35 years    Types: Cigarettes    Quit date: 01/30/1979  . Smokeless tobacco: Never Used  . Alcohol Use: Yes     Comment: "5 days per week I drink 1 glass of wine"    Family history is noncontributory    Review of Systems  DATA OBTAINED: from patient GENERAL: Feels well no fevers, fatigue, appetite changes SKIN: No itching, rash; op site R abd dressed EYES: No eye pain, redness, discharge EARS: No earache, tinnitus, change in hearing NOSE: No congestion, drainage or bleeding  MOUTH/THROAT: No mouth or tooth pain, No sore throat, No difficulty chewing or swallowing  RESPIRATORY: No cough, wheezing, SOB CARDIAC: No chest pain, palpitations, lower  extremity edema  GI: No abdominal pain, No N/V/D or constipation, No heartburn or reflux  GU: No dysuria, frequency or urgency, or incontinence  MUSCULOSKELETAL: No unrelieved bone/joint pain NEUROLOGIC: No headache, dizziness or focal weakness PSYCHIATRIC: No overt anxiety or sadness. Sleeps well. No behavior issue.   Filed Vitals:   08/28/13 1922  BP: 143/86  Pulse: 75  Temp: 98.2 F (36.8 C)  Resp: 20    Physical Exam  GENERAL APPEARANCE: Alert, conversant. Appropriately groomed. No acute distress.  SKIN: No diaphoresis rash; R and with absorbant dressing HEAD: Normocephalic, atraumatic  EYES: Conjunctiva/lids clear. Pupils  round, reactive. EOMs intact.  EARS: External exam WNL, canals clear. Hearing grossly normal.  NOSE: No deformity or discharge.  MOUTH/THROAT: Lips w/o lesions.  RESPIRATORY: Breathing is even, unlabored. Lung sounds are clear   CARDIOVASCULAR: Heart RRR no murmurs, rubs or gallops. No peripheral edema.  GASTROINTESTINAL: Abdomen is soft, non-tender, not distended w/ normal bowel sounds. GENITOURINARY: Bladder non tender, not distended  MUSCULOSKELETAL: No abnormal joints or musculature NEUROLOGIC: Oriented X3. Cranial nerves 2-12 grossly intact. Moves all extremities no tremor. PSYCHIATRIC: Mood and affect appropriate to situation, no behavioral issues  Patient Active Problem List   Diagnosis Date Noted  . S/P laparoscopic appendectomy Jan 2015 08/28/2013  . Hyperlipidemia   . Hypertension   . Atrial fibrillation     CBC    Component Value Date/Time   WBC 9.8 08/27/2013 0845   RBC 5.14 08/27/2013 0845   HGB 15.5 08/27/2013 0845   HCT 45.3 08/27/2013 0845   PLT 313 08/27/2013 0845   MCV 88.1 08/27/2013 0845   LYMPHSABS 1.3 08/22/2013 0355   MONOABS 1.1* 08/22/2013 0355   EOSABS 0.1 08/22/2013 0355   BASOSABS 0.0 08/22/2013 0355    CMP     Component Value Date/Time   NA 137 08/27/2013 0845   K 3.2* 08/27/2013 0845   CL 97 08/27/2013 0845   CO2 27 08/27/2013 0845   GLUCOSE 112* 08/27/2013 0845   BUN 15 08/27/2013 0845   CREATININE 0.83 08/27/2013 0845   CALCIUM 8.6 08/27/2013 0845   PROT 6.9 08/20/2013 1607   ALBUMIN 4.0 08/20/2013 1607   AST 16 08/20/2013 1607   ALT 14 08/20/2013 1607   ALKPHOS 74 08/20/2013 1607   BILITOT 1.6* 08/20/2013 1607   GFRNONAA 78* 08/27/2013 0845   GFRAA >90 08/27/2013 0845    Assessment and Plan  S/P laparoscopic appendectomy Jan 2015 No other known;was on ertapenam prior to surg;assume pt is on augmentin sec to surgery findings  Atrial fibrillation Was held for surgery;on it now again for prophylaxis  Hypertension Controlled on norvasc 5 mg  Hyperlipidemia On  lipitor 10 mg; will continue    Hennie Duos, MD

## 2013-09-01 ENCOUNTER — Encounter: Payer: Self-pay | Admitting: Internal Medicine

## 2013-09-01 ENCOUNTER — Non-Acute Institutional Stay (SKILLED_NURSING_FACILITY): Payer: Medicare Other | Admitting: Internal Medicine

## 2013-09-01 DIAGNOSIS — I10 Essential (primary) hypertension: Secondary | ICD-10-CM | POA: Diagnosis not present

## 2013-09-01 DIAGNOSIS — M1990 Unspecified osteoarthritis, unspecified site: Secondary | ICD-10-CM

## 2013-09-01 DIAGNOSIS — E785 Hyperlipidemia, unspecified: Secondary | ICD-10-CM

## 2013-09-01 DIAGNOSIS — Z9889 Other specified postprocedural states: Secondary | ICD-10-CM | POA: Diagnosis not present

## 2013-09-01 DIAGNOSIS — M129 Arthropathy, unspecified: Secondary | ICD-10-CM

## 2013-09-01 DIAGNOSIS — I4891 Unspecified atrial fibrillation: Secondary | ICD-10-CM

## 2013-09-01 DIAGNOSIS — Z9049 Acquired absence of other specified parts of digestive tract: Secondary | ICD-10-CM

## 2013-09-01 NOTE — Progress Notes (Signed)
MRN: [id number] Name: Justin Potter  Sex: male Age: 80 y.o. DOB: 1925/12/12  [place] #: Helene Kelp Facility/Room: 215 Level Of Care: SNF Provider: Inocencio Homes D Emergency Contacts: Extended Emergency Contact Information Primary Emergency Contact: [place]row[place],Jean Address: [address] [place]          [place],  [address] [place] Phone: [phone] Relation: Spouse Secondary Emergency Contact: Shelly Flatten [place] Phone: [phone] Relation: Son  Code Status: FULL  Allergies: Review of patient's allergies indicates no known allergies.  Chief Complaint  Patient presents with  . Discharge Note    HPI: Patient is 80 y.o. male who underwent laparoscopic appendectomy and was admitted to SNF for supportive care and PT/OT for mild deconditioning. Pt is now ready for discharge back to his assisted living facility to be with his wife who just had a hip replacement.  Past Medical History  Diagnosis Date  . History of TIA (transient ischemic attack)     probable tia no residual  09-09-2012  . Gross hematuria   . Bladder neck contracture   . Urinary retention   . History of prostate cancer     S/P RADIOACTIVE SEED IMPLANTS  . Urethral stricture   . History of gout   . Macular degeneration     both eyes  . History of squamous cell carcinoma of skin     EXCISION LOWER LIP AND RIGHT EAR  . Dysrhythmia     A-FIB / HX BRADYCARDIA - FOLLOWED BY DR. [place]  . Anxiety   . Hyperlipidemia   . Hypertension   . Atrial fibrillation   . Arthritis     "in the fingers" per pt    Past Surgical History  Procedure Laterality Date  . Radioactive prostate seed implants  1998  . Total knee arthroplasty Right 11-23-2004  . Tonsillectomy  as child  . Cholecystectomy  1977  . Cataract extraction w/ intraocular lens  implant, bilateral    . Cystoscopy with urethral dilatation N/A 02/12/2013     Procedure: CYSTOSCOPY WITH BALLOON DILATATION OF URETHRAL STRICTURE AND BLADDER FULGERATION;  Surgeon: Bernestine Amass, MD;  [facility];  Service: Urology;  Laterality: N/A;  . Laparoscopic appendectomy N/A 08/22/2013    Procedure: APPENDECTOMY LAPAROSCOPIC;  Surgeon: Pedro Earls, MD;  Location: [facility];  Service: General;  Laterality: N/A;      Medication List       This list is accurate as of: 09/01/13  1:34 PM.  Always use your most recent med list.               acidophilus Caps capsule  Take 1 capsule by mouth daily.     amLODipine 5 MG tablet  Commonly known as:  NORVASC  Take 5 mg by mouth every evening.     atorvastatin 10 MG tablet  Commonly known as:  LIPITOR  Take 10 mg by mouth every evening.     clonazePAM 0.5 MG tablet  Commonly known as:  KLONOPIN  Take 0.5 mg by mouth 2 (two) times daily as needed for anxiety.     docusate sodium 100 MG capsule  Commonly known as:  COLACE  Take 100 mg by mouth 2 (two) times daily.     ELIQUIS 5 MG Tabs tablet  Generic drug:  apixaban  Take 5 mg by mouth 2 (two) times daily.     HYDROcodone-acetaminophen 5-500 MG  per tablet  Commonly known as:  LORTAB 5  Take 1 tablet by mouth every 6 (six) hours as needed for pain.     nabumetone 500 MG tablet  Commonly known as:  RELAFEN  Take 500 mg by mouth 2 (two) times daily.     OCUVITE PRESERVISION PO  Take 1 tablet by mouth every morning.        Meds ordered this encounter  Medications  . acidophilus (RISAQUAD) CAPS capsule    Sig: Take 1 capsule by mouth daily.     There is no immunization history on file for this patient.  History  Substance Use Topics  . Smoking status: Former Smoker -- 1.50 packs/day for 35 years    Types: Cigarettes    Quit date: 01/30/1979  . Smokeless tobacco: Never Used  . Alcohol Use: Yes     Comment: "5 days per week I drink 1 glass of wine"    Filed Vitals:   09/01/13 1249  BP: 151/92  Pulse: 89  Temp: 98 F (36.7 C)   Resp: 18    Physical Exam  GENERAL APPEARANCE: Alert, conversant. Appropriately groomed. No acute distress.  HEENT: Unremarkable. RESPIRATORY: Breathing is even, unlabored. Lung sounds are clear   CARDIOVASCULAR: Heart RRR no murmurs, rubs or gallops. No peripheral edema.  GASTROINTESTINAL: Abdomen is soft, non-tender, not distended w/ normal bowel sounds.; steri strips and dressing in place  NEUROLOGIC: Cranial nerves 2-12 grossly intact. Moves all extremities no tremor.  Patient Active Problem List   Diagnosis Date Noted  . Arthritis   . S/P laparoscopic appendectomy Jan 2015 08/28/2013  . Hyperlipidemia   . Hypertension   . Atrial fibrillation     CBC    Component Value Date/Time   WBC 9.8 08/27/2013 0845   RBC 5.14 08/27/2013 0845   HGB 15.5 08/27/2013 0845   HCT 45.3 08/27/2013 0845   PLT 313 08/27/2013 0845   MCV 88.1 08/27/2013 0845   LYMPHSABS 1.3 08/22/2013 0355   MONOABS 1.1* 08/22/2013 0355   EOSABS 0.1 08/22/2013 0355   BASOSABS 0.0 08/22/2013 0355    CMP     Component Value Date/Time   NA 137 08/27/2013 0845   K 3.2* 08/27/2013 0845   CL 97 08/27/2013 0845   CO2 27 08/27/2013 0845   GLUCOSE 112* 08/27/2013 0845   BUN 15 08/27/2013 0845   CREATININE 0.83 08/27/2013 0845   CALCIUM 8.6 08/27/2013 0845   PROT 6.9 08/20/2013 1607   ALBUMIN 4.0 08/20/2013 1607   AST 16 08/20/2013 1607   ALT 14 08/20/2013 1607   ALKPHOS 74 08/20/2013 1607   BILITOT 1.6* 08/20/2013 1607   GFRNONAA 78* 08/27/2013 0845   GFRAA >90 08/27/2013 0845    Assessment and Plan  Patient is discharged to his assisted living home in improved and stable condition. His son, who is a physician, and his daughter in law will be participating in his care also.  Hennie Duos, MD

## 2013-09-04 DIAGNOSIS — N35919 Unspecified urethral stricture, male, unspecified site: Secondary | ICD-10-CM | POA: Diagnosis not present

## 2013-09-04 DIAGNOSIS — R300 Dysuria: Secondary | ICD-10-CM | POA: Diagnosis not present

## 2013-09-04 DIAGNOSIS — N320 Bladder-neck obstruction: Secondary | ICD-10-CM | POA: Diagnosis not present

## 2013-09-10 DIAGNOSIS — I4891 Unspecified atrial fibrillation: Secondary | ICD-10-CM | POA: Diagnosis not present

## 2013-09-10 DIAGNOSIS — R269 Unspecified abnormalities of gait and mobility: Secondary | ICD-10-CM | POA: Diagnosis not present

## 2013-09-10 DIAGNOSIS — I10 Essential (primary) hypertension: Secondary | ICD-10-CM | POA: Diagnosis not present

## 2013-09-10 DIAGNOSIS — Z48815 Encounter for surgical aftercare following surgery on the digestive system: Secondary | ICD-10-CM | POA: Diagnosis not present

## 2013-09-10 DIAGNOSIS — M6281 Muscle weakness (generalized): Secondary | ICD-10-CM | POA: Diagnosis not present

## 2013-09-12 DIAGNOSIS — R310 Gross hematuria: Secondary | ICD-10-CM | POA: Diagnosis not present

## 2013-09-12 DIAGNOSIS — R339 Retention of urine, unspecified: Secondary | ICD-10-CM | POA: Diagnosis not present

## 2013-09-12 DIAGNOSIS — N320 Bladder-neck obstruction: Secondary | ICD-10-CM | POA: Diagnosis not present

## 2013-09-12 DIAGNOSIS — N35919 Unspecified urethral stricture, male, unspecified site: Secondary | ICD-10-CM | POA: Diagnosis not present

## 2013-09-15 ENCOUNTER — Encounter: Payer: Self-pay | Admitting: Interventional Cardiology

## 2013-09-15 DIAGNOSIS — I10 Essential (primary) hypertension: Secondary | ICD-10-CM | POA: Diagnosis not present

## 2013-09-15 DIAGNOSIS — I4891 Unspecified atrial fibrillation: Secondary | ICD-10-CM | POA: Diagnosis not present

## 2013-09-15 DIAGNOSIS — R269 Unspecified abnormalities of gait and mobility: Secondary | ICD-10-CM | POA: Diagnosis not present

## 2013-09-15 DIAGNOSIS — Z48815 Encounter for surgical aftercare following surgery on the digestive system: Secondary | ICD-10-CM | POA: Diagnosis not present

## 2013-09-15 DIAGNOSIS — M6281 Muscle weakness (generalized): Secondary | ICD-10-CM | POA: Diagnosis not present

## 2013-09-16 DIAGNOSIS — Z48815 Encounter for surgical aftercare following surgery on the digestive system: Secondary | ICD-10-CM | POA: Diagnosis not present

## 2013-09-16 DIAGNOSIS — I4891 Unspecified atrial fibrillation: Secondary | ICD-10-CM | POA: Diagnosis not present

## 2013-09-16 DIAGNOSIS — M6281 Muscle weakness (generalized): Secondary | ICD-10-CM | POA: Diagnosis not present

## 2013-09-16 DIAGNOSIS — I10 Essential (primary) hypertension: Secondary | ICD-10-CM | POA: Diagnosis not present

## 2013-09-16 DIAGNOSIS — R269 Unspecified abnormalities of gait and mobility: Secondary | ICD-10-CM | POA: Diagnosis not present

## 2013-09-17 DIAGNOSIS — H353290 Exudative age-related macular degeneration, unspecified eye, stage unspecified: Secondary | ICD-10-CM | POA: Diagnosis not present

## 2013-09-17 DIAGNOSIS — H35359 Cystoid macular degeneration, unspecified eye: Secondary | ICD-10-CM | POA: Diagnosis not present

## 2013-09-17 DIAGNOSIS — H35059 Retinal neovascularization, unspecified, unspecified eye: Secondary | ICD-10-CM | POA: Diagnosis not present

## 2013-09-19 ENCOUNTER — Ambulatory Visit (INDEPENDENT_AMBULATORY_CARE_PROVIDER_SITE_OTHER): Payer: Medicare Other | Admitting: Interventional Cardiology

## 2013-09-19 ENCOUNTER — Ambulatory Visit: Payer: Medicare Other | Admitting: Interventional Cardiology

## 2013-09-19 ENCOUNTER — Encounter: Payer: Self-pay | Admitting: Interventional Cardiology

## 2013-09-19 ENCOUNTER — Other Ambulatory Visit: Payer: Medicare Other

## 2013-09-19 VITALS — BP 120/70 | HR 76 | Ht 74.0 in | Wt 184.0 lb

## 2013-09-19 DIAGNOSIS — Z7901 Long term (current) use of anticoagulants: Secondary | ICD-10-CM | POA: Diagnosis not present

## 2013-09-19 DIAGNOSIS — I10 Essential (primary) hypertension: Secondary | ICD-10-CM | POA: Diagnosis not present

## 2013-09-19 DIAGNOSIS — I4891 Unspecified atrial fibrillation: Secondary | ICD-10-CM

## 2013-09-19 NOTE — Progress Notes (Signed)
Patient ID: Justin Potter, male   DOB: 13-Apr-1926, 80 y.o.   MRN: [id number]    [address], [place]  [address] Phone: [phone] Fax:  [phone]  Date:  09/19/2013   [name], DOB 11-Apr-1926, MRN [id number]  PCP:  Horton Finer, MD   ASSESSMENT:  1. Chronic atrial fibrillation 2. Chronic anticoagulation therapy 3. History of transient ischemic attack, probably cardioembolic  PLAN:  1. Continue Eliquis 2. Hemoglobin and creatinine in 6 months 3. Office visit in one year   SUBJECTIVE: Justin Potter is a 80 y.o. male who had recent emergency appendectomy. He has done well since that time. No anticoagulation complications. He has not had any new neurological complaints. He does have mild orthostatic dizziness. He has not had syncope.   Wt Readings from Last 3 Encounters:  09/19/13 184 lb (83.462 kg)  08/20/13 192 lb 14.4 oz (87.5 kg)  08/20/13 192 lb 14.4 oz (87.5 kg)     Past Medical History  Diagnosis Date  . History of TIA (transient ischemic attack)     probable tia no residual  09-09-2012  . Gross hematuria   . Bladder neck contracture   . Urinary retention   . History of prostate cancer     S/P RADIOACTIVE SEED IMPLANTS  . Urethral stricture   . History of gout   . Macular degeneration     both eyes  . History of squamous cell carcinoma of skin     EXCISION LOWER LIP AND RIGHT EAR  . Dysrhythmia     A-FIB / HX BRADYCARDIA - FOLLOWED BY DR. [place]  . Anxiety   . Hyperlipidemia   . Hypertension   . Atrial fibrillation   . Arthritis     "in the fingers" per pt    Current Outpatient Prescriptions  Medication Sig Dispense Refill  . amLODipine (NORVASC) 5 MG tablet Take 5 mg by mouth every evening.       Marland Kitchen apixaban (ELIQUIS) 5 MG TABS tablet Take 5 mg by mouth 2 (two) times daily.      Marland Kitchen atorvastatin (LIPITOR) 10 MG tablet Take 10 mg by mouth every evening.       .  clonazePAM (KLONOPIN) 0.5 MG tablet Take 0.5 mg by mouth 2 (two) times daily as needed for anxiety.       . docusate sodium (COLACE) 100 MG capsule Take 100 mg by mouth 2 (two) times daily.      . Multiple Vitamins-Minerals (OCUVITE PRESERVISION PO) Take 1 tablet by mouth every morning.        No current facility-administered medications for this visit.    Allergies:   No Known Allergies  Social History:  The patient  reports that he quit smoking about 34 years ago. His smoking use included Cigarettes. He has a 52.5 pack-year smoking history. He has never used smokeless tobacco. He reports that he drinks alcohol. He reports that he does not use illicit drugs.   ROS:  Please see the history of present illness.   Some weight loss. Appetite is decreasing.   All other systems reviewed and negative.   OBJECTIVE: VS:  BP 120/70  Pulse 76  Ht 6\' 2"  (1.88 m)  Wt 184 lb (83.462 kg)  BMI 23.61 kg/m2 Well nourished, well developed, in no acute distress, appears than stated age 77: normal Neck: JVD flat. Carotid bruit absent  Cardiac:  normal S1, S2; IIRR; no murmur Lungs:  clear to auscultation bilaterally, no wheezing, rhonchi or rales Abd: soft, nontender, no hepatomegaly Ext: Edema absent. Pulses 2+ bilateral Skin: warm and dry Neuro:  CNs 2-12 intact, no focal abnormalities noted  EKG:  From January, atrial fib with controlled rate.       Signed, Illene Labrador III, MD 09/19/2013 3:43 PM

## 2013-09-19 NOTE — Patient Instructions (Addendum)
Your physician recommends that you continue on your current medications as directed. Please refer to the Current Medication list given to you today.  Your physician recommends that you return for lab work in: 6 months (hemoglobin,creatinine).   Your physician wants you to follow-up in: 1 year You will receive a reminder letter in the mail two months in advance. If you don't receive a letter, please call our office to schedule the follow-up appointment.

## 2013-09-20 DIAGNOSIS — R269 Unspecified abnormalities of gait and mobility: Secondary | ICD-10-CM | POA: Diagnosis not present

## 2013-09-20 DIAGNOSIS — I10 Essential (primary) hypertension: Secondary | ICD-10-CM | POA: Diagnosis not present

## 2013-09-20 DIAGNOSIS — I4891 Unspecified atrial fibrillation: Secondary | ICD-10-CM | POA: Diagnosis not present

## 2013-09-20 DIAGNOSIS — M6281 Muscle weakness (generalized): Secondary | ICD-10-CM | POA: Diagnosis not present

## 2013-09-20 DIAGNOSIS — Z48815 Encounter for surgical aftercare following surgery on the digestive system: Secondary | ICD-10-CM | POA: Diagnosis not present

## 2013-09-22 DIAGNOSIS — R269 Unspecified abnormalities of gait and mobility: Secondary | ICD-10-CM | POA: Diagnosis not present

## 2013-09-22 DIAGNOSIS — M6281 Muscle weakness (generalized): Secondary | ICD-10-CM | POA: Diagnosis not present

## 2013-09-22 DIAGNOSIS — I4891 Unspecified atrial fibrillation: Secondary | ICD-10-CM | POA: Diagnosis not present

## 2013-09-22 DIAGNOSIS — Z48815 Encounter for surgical aftercare following surgery on the digestive system: Secondary | ICD-10-CM | POA: Diagnosis not present

## 2013-09-22 DIAGNOSIS — I10 Essential (primary) hypertension: Secondary | ICD-10-CM | POA: Diagnosis not present

## 2013-09-23 DIAGNOSIS — M6281 Muscle weakness (generalized): Secondary | ICD-10-CM | POA: Diagnosis not present

## 2013-09-23 DIAGNOSIS — Z48815 Encounter for surgical aftercare following surgery on the digestive system: Secondary | ICD-10-CM | POA: Diagnosis not present

## 2013-09-23 DIAGNOSIS — I4891 Unspecified atrial fibrillation: Secondary | ICD-10-CM | POA: Diagnosis not present

## 2013-09-23 DIAGNOSIS — R269 Unspecified abnormalities of gait and mobility: Secondary | ICD-10-CM | POA: Diagnosis not present

## 2013-09-23 DIAGNOSIS — I10 Essential (primary) hypertension: Secondary | ICD-10-CM | POA: Diagnosis not present

## 2013-09-25 ENCOUNTER — Encounter (INDEPENDENT_AMBULATORY_CARE_PROVIDER_SITE_OTHER): Payer: Self-pay | Admitting: Surgery

## 2013-09-25 ENCOUNTER — Ambulatory Visit (INDEPENDENT_AMBULATORY_CARE_PROVIDER_SITE_OTHER): Payer: Medicare Other | Admitting: Surgery

## 2013-09-25 VITALS — BP 116/70 | HR 66 | Temp 99.2°F | Resp 16 | Ht 74.0 in | Wt 189.0 lb

## 2013-09-25 DIAGNOSIS — Z9889 Other specified postprocedural states: Secondary | ICD-10-CM

## 2013-09-25 DIAGNOSIS — Z9049 Acquired absence of other specified parts of digestive tract: Secondary | ICD-10-CM

## 2013-09-25 NOTE — Patient Instructions (Signed)
Pursue outpatient physical therapy to increase stamina

## 2013-09-25 NOTE — Progress Notes (Signed)
Justin Potter 80 y.o.  Body mass index is 24.26 kg/(m^2).  Patient Active Problem List   Diagnosis Date Noted  . Long term (current) use of anticoagulants 09/19/2013  . Arthritis   . S/P laparoscopic appendectomy Jan 2015 08/28/2013  . Hyperlipidemia   . Hypertension   . Atrial fibrillation     No Known Allergies  Past Surgical History  Procedure Laterality Date  . Radioactive prostate seed implants  1998  . Total knee arthroplasty Right 11-23-2004  . Tonsillectomy  as child  . Cholecystectomy  1977  . Cataract extraction w/ intraocular lens  implant, bilateral    . Cystoscopy with urethral dilatation N/A 02/12/2013    Procedure: CYSTOSCOPY WITH BALLOON DILATATION OF URETHRAL STRICTURE AND BLADDER FULGERATION;  Surgeon: Bernestine Amass, MD;  Location: [facility];  Service: Urology;  Laterality: N/A;  . Laparoscopic appendectomy N/A 08/22/2013    Procedure: APPENDECTOMY LAPAROSCOPIC;  Surgeon: Pedro Earls, MD;  Location: [facility];  Service: General;  Laterality: N/A;  . Appendectomy     Horton Finer, MD No diagnosis found.  Doing well and has done well with PT.  Stamina is not quite back.  Will get him back on outpatient physical therapy. Form filled out for continued PT and strengthening.  Overall he has done well from the laparoscopic appendectomy.   Return prn.  Matt B. Hassell Done, MD, [facility] [phone] beeper [phone]  09/25/2013 11:26 AM

## 2013-09-26 DIAGNOSIS — R269 Unspecified abnormalities of gait and mobility: Secondary | ICD-10-CM | POA: Diagnosis not present

## 2013-09-26 DIAGNOSIS — R279 Unspecified lack of coordination: Secondary | ICD-10-CM | POA: Diagnosis not present

## 2013-09-26 DIAGNOSIS — M6281 Muscle weakness (generalized): Secondary | ICD-10-CM | POA: Diagnosis not present

## 2013-09-29 DIAGNOSIS — R269 Unspecified abnormalities of gait and mobility: Secondary | ICD-10-CM | POA: Diagnosis not present

## 2013-09-29 DIAGNOSIS — R279 Unspecified lack of coordination: Secondary | ICD-10-CM | POA: Diagnosis not present

## 2013-09-29 DIAGNOSIS — M6281 Muscle weakness (generalized): Secondary | ICD-10-CM | POA: Diagnosis not present

## 2013-09-30 ENCOUNTER — Other Ambulatory Visit: Payer: Self-pay | Admitting: Dermatology

## 2013-09-30 DIAGNOSIS — Z85828 Personal history of other malignant neoplasm of skin: Secondary | ICD-10-CM | POA: Diagnosis not present

## 2013-09-30 DIAGNOSIS — L821 Other seborrheic keratosis: Secondary | ICD-10-CM | POA: Diagnosis not present

## 2013-09-30 DIAGNOSIS — D0470 Carcinoma in situ of skin of unspecified lower limb, including hip: Secondary | ICD-10-CM | POA: Diagnosis not present

## 2013-09-30 DIAGNOSIS — L723 Sebaceous cyst: Secondary | ICD-10-CM | POA: Diagnosis not present

## 2013-09-30 DIAGNOSIS — R279 Unspecified lack of coordination: Secondary | ICD-10-CM | POA: Diagnosis not present

## 2013-09-30 DIAGNOSIS — M6281 Muscle weakness (generalized): Secondary | ICD-10-CM | POA: Diagnosis not present

## 2013-09-30 DIAGNOSIS — Z85820 Personal history of malignant melanoma of skin: Secondary | ICD-10-CM | POA: Diagnosis not present

## 2013-09-30 DIAGNOSIS — D485 Neoplasm of uncertain behavior of skin: Secondary | ICD-10-CM | POA: Diagnosis not present

## 2013-09-30 DIAGNOSIS — L570 Actinic keratosis: Secondary | ICD-10-CM | POA: Diagnosis not present

## 2013-09-30 DIAGNOSIS — R269 Unspecified abnormalities of gait and mobility: Secondary | ICD-10-CM | POA: Diagnosis not present

## 2013-10-02 DIAGNOSIS — R279 Unspecified lack of coordination: Secondary | ICD-10-CM | POA: Diagnosis not present

## 2013-10-02 DIAGNOSIS — M6281 Muscle weakness (generalized): Secondary | ICD-10-CM | POA: Diagnosis not present

## 2013-10-02 DIAGNOSIS — R269 Unspecified abnormalities of gait and mobility: Secondary | ICD-10-CM | POA: Diagnosis not present

## 2013-10-06 DIAGNOSIS — R279 Unspecified lack of coordination: Secondary | ICD-10-CM | POA: Diagnosis not present

## 2013-10-06 DIAGNOSIS — M6281 Muscle weakness (generalized): Secondary | ICD-10-CM | POA: Diagnosis not present

## 2013-10-06 DIAGNOSIS — R269 Unspecified abnormalities of gait and mobility: Secondary | ICD-10-CM | POA: Diagnosis not present

## 2013-10-08 DIAGNOSIS — M6281 Muscle weakness (generalized): Secondary | ICD-10-CM | POA: Diagnosis not present

## 2013-10-08 DIAGNOSIS — R269 Unspecified abnormalities of gait and mobility: Secondary | ICD-10-CM | POA: Diagnosis not present

## 2013-10-08 DIAGNOSIS — R279 Unspecified lack of coordination: Secondary | ICD-10-CM | POA: Diagnosis not present

## 2013-10-10 DIAGNOSIS — M6281 Muscle weakness (generalized): Secondary | ICD-10-CM | POA: Diagnosis not present

## 2013-10-10 DIAGNOSIS — R269 Unspecified abnormalities of gait and mobility: Secondary | ICD-10-CM | POA: Diagnosis not present

## 2013-10-10 DIAGNOSIS — R279 Unspecified lack of coordination: Secondary | ICD-10-CM | POA: Diagnosis not present

## 2013-10-17 DIAGNOSIS — M6281 Muscle weakness (generalized): Secondary | ICD-10-CM | POA: Diagnosis not present

## 2013-10-17 DIAGNOSIS — R269 Unspecified abnormalities of gait and mobility: Secondary | ICD-10-CM | POA: Diagnosis not present

## 2013-10-17 DIAGNOSIS — R279 Unspecified lack of coordination: Secondary | ICD-10-CM | POA: Diagnosis not present

## 2013-10-20 DIAGNOSIS — R279 Unspecified lack of coordination: Secondary | ICD-10-CM | POA: Diagnosis not present

## 2013-10-20 DIAGNOSIS — M6281 Muscle weakness (generalized): Secondary | ICD-10-CM | POA: Diagnosis not present

## 2013-10-20 DIAGNOSIS — R269 Unspecified abnormalities of gait and mobility: Secondary | ICD-10-CM | POA: Diagnosis not present

## 2013-10-22 DIAGNOSIS — R279 Unspecified lack of coordination: Secondary | ICD-10-CM | POA: Diagnosis not present

## 2013-10-22 DIAGNOSIS — R269 Unspecified abnormalities of gait and mobility: Secondary | ICD-10-CM | POA: Diagnosis not present

## 2013-10-22 DIAGNOSIS — M6281 Muscle weakness (generalized): Secondary | ICD-10-CM | POA: Diagnosis not present

## 2013-10-27 DIAGNOSIS — M545 Low back pain, unspecified: Secondary | ICD-10-CM | POA: Diagnosis not present

## 2013-10-27 DIAGNOSIS — M25559 Pain in unspecified hip: Secondary | ICD-10-CM | POA: Diagnosis not present

## 2013-11-07 DIAGNOSIS — M47817 Spondylosis without myelopathy or radiculopathy, lumbosacral region: Secondary | ICD-10-CM | POA: Diagnosis not present

## 2013-11-12 DIAGNOSIS — M545 Low back pain, unspecified: Secondary | ICD-10-CM | POA: Diagnosis not present

## 2013-11-17 DIAGNOSIS — IMO0002 Reserved for concepts with insufficient information to code with codable children: Secondary | ICD-10-CM | POA: Diagnosis not present

## 2013-11-19 DIAGNOSIS — H35359 Cystoid macular degeneration, unspecified eye: Secondary | ICD-10-CM | POA: Diagnosis not present

## 2013-11-19 DIAGNOSIS — H35059 Retinal neovascularization, unspecified, unspecified eye: Secondary | ICD-10-CM | POA: Diagnosis not present

## 2013-11-19 DIAGNOSIS — H353290 Exudative age-related macular degeneration, unspecified eye, stage unspecified: Secondary | ICD-10-CM | POA: Diagnosis not present

## 2013-11-25 DIAGNOSIS — Z1331 Encounter for screening for depression: Secondary | ICD-10-CM | POA: Diagnosis not present

## 2013-11-25 DIAGNOSIS — I4891 Unspecified atrial fibrillation: Secondary | ICD-10-CM | POA: Diagnosis not present

## 2013-11-25 DIAGNOSIS — R911 Solitary pulmonary nodule: Secondary | ICD-10-CM | POA: Diagnosis not present

## 2013-11-25 DIAGNOSIS — R609 Edema, unspecified: Secondary | ICD-10-CM | POA: Diagnosis not present

## 2013-11-25 DIAGNOSIS — Z0000 Encounter for general adult medical examination without abnormal findings: Secondary | ICD-10-CM | POA: Diagnosis not present

## 2013-11-25 DIAGNOSIS — I10 Essential (primary) hypertension: Secondary | ICD-10-CM | POA: Diagnosis not present

## 2013-11-25 DIAGNOSIS — N139 Obstructive and reflux uropathy, unspecified: Secondary | ICD-10-CM | POA: Diagnosis not present

## 2013-11-25 DIAGNOSIS — I779 Disorder of arteries and arterioles, unspecified: Secondary | ICD-10-CM | POA: Diagnosis not present

## 2013-11-25 DIAGNOSIS — Z23 Encounter for immunization: Secondary | ICD-10-CM | POA: Diagnosis not present

## 2013-11-26 ENCOUNTER — Other Ambulatory Visit: Payer: Self-pay | Admitting: Internal Medicine

## 2013-11-26 DIAGNOSIS — R911 Solitary pulmonary nodule: Secondary | ICD-10-CM

## 2013-12-10 DIAGNOSIS — H52209 Unspecified astigmatism, unspecified eye: Secondary | ICD-10-CM | POA: Diagnosis not present

## 2013-12-10 DIAGNOSIS — H3530 Unspecified macular degeneration: Secondary | ICD-10-CM | POA: Diagnosis not present

## 2014-01-14 DIAGNOSIS — H353290 Exudative age-related macular degeneration, unspecified eye, stage unspecified: Secondary | ICD-10-CM | POA: Diagnosis not present

## 2014-01-14 DIAGNOSIS — H35059 Retinal neovascularization, unspecified, unspecified eye: Secondary | ICD-10-CM | POA: Diagnosis not present

## 2014-01-14 DIAGNOSIS — H35359 Cystoid macular degeneration, unspecified eye: Secondary | ICD-10-CM | POA: Diagnosis not present

## 2014-02-10 ENCOUNTER — Ambulatory Visit
Admission: RE | Admit: 2014-02-10 | Discharge: 2014-02-10 | Disposition: A | Discharge status: Home / Self Care | Payer: Medicare Other | Source: Ambulatory Visit | Attending: Internal Medicine | Admitting: Internal Medicine

## 2014-02-10 DIAGNOSIS — R911 Solitary pulmonary nodule: Secondary | ICD-10-CM

## 2014-02-27 DIAGNOSIS — N320 Bladder-neck obstruction: Secondary | ICD-10-CM | POA: Diagnosis not present

## 2014-02-27 DIAGNOSIS — Z8546 Personal history of malignant neoplasm of prostate: Secondary | ICD-10-CM | POA: Diagnosis not present

## 2014-02-27 DIAGNOSIS — N35919 Unspecified urethral stricture, male, unspecified site: Secondary | ICD-10-CM | POA: Diagnosis not present

## 2014-03-18 ENCOUNTER — Other Ambulatory Visit (INDEPENDENT_AMBULATORY_CARE_PROVIDER_SITE_OTHER): Payer: Medicare Other

## 2014-03-18 DIAGNOSIS — Z7901 Long term (current) use of anticoagulants: Secondary | ICD-10-CM | POA: Diagnosis not present

## 2014-03-18 LAB — CREATININE, SERUM: CREATININE: 0.8 mg/dL (ref 0.4–1.5)

## 2014-03-18 LAB — HEMOGLOBIN: Hemoglobin: 13.6 g/dL (ref 13.0–17.0)

## 2014-03-25 DIAGNOSIS — H353290 Exudative age-related macular degeneration, unspecified eye, stage unspecified: Secondary | ICD-10-CM | POA: Diagnosis not present

## 2014-03-25 DIAGNOSIS — H35059 Retinal neovascularization, unspecified, unspecified eye: Secondary | ICD-10-CM | POA: Diagnosis not present

## 2014-03-25 DIAGNOSIS — H35359 Cystoid macular degeneration, unspecified eye: Secondary | ICD-10-CM | POA: Diagnosis not present

## 2014-04-02 ENCOUNTER — Other Ambulatory Visit: Payer: Self-pay | Admitting: Dermatology

## 2014-04-02 DIAGNOSIS — D485 Neoplasm of uncertain behavior of skin: Secondary | ICD-10-CM | POA: Diagnosis not present

## 2014-04-02 DIAGNOSIS — L570 Actinic keratosis: Secondary | ICD-10-CM | POA: Diagnosis not present

## 2014-04-02 DIAGNOSIS — Z85820 Personal history of malignant melanoma of skin: Secondary | ICD-10-CM | POA: Diagnosis not present

## 2014-04-02 DIAGNOSIS — L723 Sebaceous cyst: Secondary | ICD-10-CM | POA: Diagnosis not present

## 2014-04-02 DIAGNOSIS — Z85828 Personal history of other malignant neoplasm of skin: Secondary | ICD-10-CM | POA: Diagnosis not present

## 2014-04-02 DIAGNOSIS — L821 Other seborrheic keratosis: Secondary | ICD-10-CM | POA: Diagnosis not present

## 2014-04-28 DIAGNOSIS — Z23 Encounter for immunization: Secondary | ICD-10-CM | POA: Diagnosis not present

## 2014-06-02 DIAGNOSIS — I480 Paroxysmal atrial fibrillation: Secondary | ICD-10-CM | POA: Diagnosis not present

## 2014-06-02 DIAGNOSIS — N139 Obstructive and reflux uropathy, unspecified: Secondary | ICD-10-CM | POA: Diagnosis not present

## 2014-06-02 DIAGNOSIS — I10 Essential (primary) hypertension: Secondary | ICD-10-CM | POA: Diagnosis not present

## 2014-06-02 DIAGNOSIS — E780 Pure hypercholesterolemia: Secondary | ICD-10-CM | POA: Diagnosis not present

## 2014-06-03 DIAGNOSIS — H3532 Exudative age-related macular degeneration: Secondary | ICD-10-CM | POA: Diagnosis not present

## 2014-06-03 DIAGNOSIS — H35351 Cystoid macular degeneration, right eye: Secondary | ICD-10-CM | POA: Diagnosis not present

## 2014-08-06 ENCOUNTER — Encounter (HOSPITAL_BASED_OUTPATIENT_CLINIC_OR_DEPARTMENT_OTHER): Payer: Self-pay | Admitting: Urology

## 2014-08-12 DIAGNOSIS — H3532 Exudative age-related macular degeneration: Secondary | ICD-10-CM | POA: Diagnosis not present

## 2014-08-12 DIAGNOSIS — H35051 Retinal neovascularization, unspecified, right eye: Secondary | ICD-10-CM | POA: Diagnosis not present

## 2014-09-04 DIAGNOSIS — N359 Urethral stricture, unspecified: Secondary | ICD-10-CM | POA: Diagnosis not present

## 2014-09-04 DIAGNOSIS — Z8546 Personal history of malignant neoplasm of prostate: Secondary | ICD-10-CM | POA: Diagnosis not present

## 2014-09-04 DIAGNOSIS — N320 Bladder-neck obstruction: Secondary | ICD-10-CM | POA: Diagnosis not present

## 2014-09-25 ENCOUNTER — Encounter: Payer: Self-pay | Admitting: Interventional Cardiology

## 2014-09-25 ENCOUNTER — Ambulatory Visit (INDEPENDENT_AMBULATORY_CARE_PROVIDER_SITE_OTHER): Payer: Medicare Other | Admitting: Interventional Cardiology

## 2014-09-25 VITALS — BP 130/56 | HR 60 | Ht 74.0 in | Wt 186.4 lb

## 2014-09-25 DIAGNOSIS — I10 Essential (primary) hypertension: Secondary | ICD-10-CM

## 2014-09-25 DIAGNOSIS — E785 Hyperlipidemia, unspecified: Secondary | ICD-10-CM | POA: Diagnosis not present

## 2014-09-25 DIAGNOSIS — I480 Paroxysmal atrial fibrillation: Secondary | ICD-10-CM | POA: Diagnosis not present

## 2014-09-25 DIAGNOSIS — Z7901 Long term (current) use of anticoagulants: Secondary | ICD-10-CM

## 2014-09-25 LAB — CREATININE, SERUM: CREATININE: 0.94 mg/dL (ref 0.40–1.50)

## 2014-09-25 LAB — HEMOGLOBIN: HEMOGLOBIN: 13.9 g/dL (ref 13.0–17.0)

## 2014-09-25 NOTE — Patient Instructions (Signed)
Your physician recommends that you continue on your current medications as directed. Please refer to the Current Medication list given to you today.  Lab Today: Creatinine, Hemoglobin  Your physician wants you to follow-up in: 1 year with Dr.Smith You will receive a reminder letter in the mail two months in advance. If you don't receive a letter, please call our office to schedule the follow-up appointment.

## 2014-09-25 NOTE — Progress Notes (Signed)
Cardiology Office Note   Date:  09/25/2014   ID:  [name], DOB February 02, 1926, MRN [id number]  PCP:  Dorian Heckle, MD  Cardiologist:  [name], MD   No chief complaint on file.     History of Present Illness: ISSAAC Potter is a 81 y.o. male who presents for follow-up of atrial fibrillation and anticoagulation therapy. He is asymptomatic and has had no complications. He is ambulatory. Keep this is sent calisthenics/aerobic activity and his assisted living. He has no questions or concerns.    Past Medical History  Diagnosis Date  . History of TIA (transient ischemic attack)     probable tia no residual  09-09-2012  . Gross hematuria   . Bladder neck contracture   . Urinary retention   . History of prostate cancer     S/P RADIOACTIVE SEED IMPLANTS  . Urethral stricture   . History of gout   . Macular degeneration     both eyes  . History of squamous cell carcinoma of skin     EXCISION LOWER LIP AND RIGHT EAR  . Dysrhythmia     A-FIB / HX BRADYCARDIA - FOLLOWED BY DR. [place]  . Anxiety   . Hyperlipidemia   . Hypertension   . Atrial fibrillation   . Arthritis     "in the fingers" per pt    Past Surgical History  Procedure Laterality Date  . Radioactive prostate seed implants  1998  . Total knee arthroplasty Right 11-23-2004  . Tonsillectomy  as child  . Cholecystectomy  1977  . Cataract extraction w/ intraocular lens  implant, bilateral    . Cystoscopy with urethral dilatation N/A 02/12/2013    Procedure: CYSTOSCOPY WITH BALLOON DILATATION OF URETHRAL STRICTURE AND BLADDER FULGERATION;  Surgeon: Bernestine Amass, MD;  Location: [facility];  Service: Urology;  Laterality: N/A;  . Laparoscopic appendectomy N/A 08/22/2013    Procedure: APPENDECTOMY LAPAROSCOPIC;  Surgeon: Pedro Earls, MD;  Location: [facility];  Service: General;  Laterality: N/A;  . Appendectomy       Current Outpatient Prescriptions  Medication  Sig Dispense Refill  . amLODipine (NORVASC) 5 MG tablet Take 5 mg by mouth every evening.     Marland Kitchen apixaban (ELIQUIS) 5 MG TABS tablet Take 5 mg by mouth 2 (two) times daily.    Marland Kitchen atorvastatin (LIPITOR) 10 MG tablet Take 10 mg by mouth every evening.     . clonazePAM (KLONOPIN) 0.5 MG tablet Take 0.5 mg by mouth 2 (two) times daily as needed for anxiety.     . docusate sodium (COLACE) 100 MG capsule Take 100 mg by mouth 2 (two) times daily.    . Multiple Vitamins-Minerals (OCUVITE PRESERVISION PO) Take 1 tablet by mouth every morning.      No current facility-administered medications for this visit.    Allergies:   Review of patient's allergies indicates no known allergies.    Social History:  The patient  reports that he quit smoking about 35 years ago. His smoking use included Cigarettes. He has a 52.5 pack-year smoking history. He has never used smokeless tobacco. He reports that he drinks alcohol. He reports that he does not use illicit drugs.   Family History:  The patient's family history includes Cancer in his father and mother.    ROS:  Please see the history of present illness.   Otherwise, review of systems are positive for occasional  fleeting chest discomfort but no other complaints.   All other systems are reviewed and negative.    PHYSICAL EXAM: VS:  BP 130/56 mmHg  Pulse 60  Ht 6\' 2"  (1.88 m)  Wt 186 lb 6.4 oz (84.55 kg)  BMI 23.92 kg/m2 , BMI Body mass index is 23.92 kg/(m^2). GEN: Well nourished, well developed, in no acute distress HEENT: normal Neck: no JVD, carotid bruits, or masses Cardiac: IIRR; no murmurs, rubs, or gallops,no edema  Respiratory:  clear to auscultation bilaterally, normal work of breathing GI: soft, nontender, nondistended, + BS MS: no deformity or atrophy Skin: warm and dry, no rash Neuro:  Strength and sensation are intact Psych: euthymic mood, full affect   EKG:  EKG is ordered today. The ekg ordered today demonstrates atrial fibrillation  with controlled ventricular rate is 60 bpm   Recent Labs: 09/25/2014: Creatinine 0.94; Hemoglobin 13.9    Lipid Panel No results found for: CHOL, TRIG, HDL, CHOLHDL, VLDL, LDLCALC, LDLDIRECT    Wt Readings from Last 3 Encounters:  09/25/14 186 lb 6.4 oz (84.55 kg)  09/25/13 189 lb (85.73 kg)  09/19/13 184 lb (83.462 kg)      Other studies Reviewed: Additional studies/ records that were reviewed today include: . Review of the above records demonstrates: Creatinine and hemoglobin were normal 6 months ago and need to be repeated   ASSESSMENT AND PLAN:  Essential hypertension - rolled  Paroxysmal atrial fibrillation - with control rate based upon today's EKG  Long term current use of anticoagulant therapy - without complications. Plan: Creatinine, Hemoglobin  Hyperlipidemia: Followed by primary care    Current medicines are reviewed at length with the patient today.  The patient does not have concerns regarding medicines.  The following changes have been made:  no change  Labs/ tests ordered today include:   Orders Placed This Encounter  Procedures  . Creatinine  . Hemoglobin  . EKG 12-Lead     Disposition:   FU with Linard Millers in 1 Year   Signed, [name], MD  09/25/2014 1:46 PM    [facility] [place], [place], [place]  [address] Phone: [phone]; Fax: [phone]

## 2014-09-29 ENCOUNTER — Telehealth: Payer: Self-pay

## 2014-09-29 NOTE — Telephone Encounter (Signed)
-----   Message from [name], MD sent at 09/25/2014  5:52 PM EST ----- Labs are normal. No change in therapy is needed

## 2014-09-29 NOTE — Telephone Encounter (Signed)
pt aware of lab results.Labs are normal. No change in therapy is neededpt verbalized understanding.

## 2014-10-07 ENCOUNTER — Other Ambulatory Visit: Payer: Self-pay | Admitting: Dermatology

## 2014-10-07 DIAGNOSIS — L723 Sebaceous cyst: Secondary | ICD-10-CM | POA: Diagnosis not present

## 2014-10-07 DIAGNOSIS — D692 Other nonthrombocytopenic purpura: Secondary | ICD-10-CM | POA: Diagnosis not present

## 2014-10-07 DIAGNOSIS — L821 Other seborrheic keratosis: Secondary | ICD-10-CM | POA: Diagnosis not present

## 2014-10-07 DIAGNOSIS — L111 Transient acantholytic dermatosis [Grover]: Secondary | ICD-10-CM | POA: Diagnosis not present

## 2014-10-07 DIAGNOSIS — Z85820 Personal history of malignant melanoma of skin: Secondary | ICD-10-CM | POA: Diagnosis not present

## 2014-10-07 DIAGNOSIS — L570 Actinic keratosis: Secondary | ICD-10-CM | POA: Diagnosis not present

## 2014-10-07 DIAGNOSIS — L820 Inflamed seborrheic keratosis: Secondary | ICD-10-CM | POA: Diagnosis not present

## 2014-10-07 DIAGNOSIS — Z85828 Personal history of other malignant neoplasm of skin: Secondary | ICD-10-CM | POA: Diagnosis not present

## 2014-10-07 DIAGNOSIS — D485 Neoplasm of uncertain behavior of skin: Secondary | ICD-10-CM | POA: Diagnosis not present

## 2014-10-07 DIAGNOSIS — C44719 Basal cell carcinoma of skin of left lower limb, including hip: Secondary | ICD-10-CM | POA: Diagnosis not present

## 2014-10-26 DIAGNOSIS — H3532 Exudative age-related macular degeneration: Secondary | ICD-10-CM | POA: Diagnosis not present

## 2014-10-26 DIAGNOSIS — H35051 Retinal neovascularization, unspecified, right eye: Secondary | ICD-10-CM | POA: Diagnosis not present

## 2014-12-01 DIAGNOSIS — Z9181 History of falling: Secondary | ICD-10-CM | POA: Diagnosis not present

## 2014-12-01 DIAGNOSIS — Z0000 Encounter for general adult medical examination without abnormal findings: Secondary | ICD-10-CM | POA: Diagnosis not present

## 2014-12-01 DIAGNOSIS — I10 Essential (primary) hypertension: Secondary | ICD-10-CM | POA: Diagnosis not present

## 2014-12-01 DIAGNOSIS — Z1389 Encounter for screening for other disorder: Secondary | ICD-10-CM | POA: Diagnosis not present

## 2014-12-01 DIAGNOSIS — E780 Pure hypercholesterolemia: Secondary | ICD-10-CM | POA: Diagnosis not present

## 2014-12-01 DIAGNOSIS — R269 Unspecified abnormalities of gait and mobility: Secondary | ICD-10-CM | POA: Diagnosis not present

## 2014-12-01 DIAGNOSIS — I480 Paroxysmal atrial fibrillation: Secondary | ICD-10-CM | POA: Diagnosis not present

## 2014-12-04 ENCOUNTER — Ambulatory Visit: Payer: Medicare Other | Attending: Internal Medicine

## 2014-12-04 DIAGNOSIS — R269 Unspecified abnormalities of gait and mobility: Secondary | ICD-10-CM | POA: Diagnosis not present

## 2014-12-04 DIAGNOSIS — R531 Weakness: Secondary | ICD-10-CM

## 2014-12-04 DIAGNOSIS — M2560 Stiffness of unspecified joint, not elsewhere classified: Secondary | ICD-10-CM

## 2014-12-04 DIAGNOSIS — R296 Repeated falls: Secondary | ICD-10-CM | POA: Insufficient documentation

## 2014-12-04 DIAGNOSIS — Z9181 History of falling: Secondary | ICD-10-CM

## 2014-12-04 NOTE — Therapy (Signed)
[facility] [address] [place], [place], [address] Phone: [phone]   Fax:  [phone]  Physical Therapy Evaluation  Patient Details  Name: Justin Potter MRN: [id number] Date of Birth: 01/07/1926 Referring Provider:  Dorian Heckle, MD  Encounter Date: 12/04/2014      PT End of Session - 12/04/14 1317    Visit Number 1   Number of Visits 9   Date for PT Re-Evaluation 01/03/15   Authorization Type G-code every 10th visit.   PT Start Time 1146   PT Stop Time 1234   PT Time Calculation (min) 48 min   Equipment Utilized During Treatment Gait belt   Activity Tolerance Patient tolerated treatment well   Behavior During Therapy WFL for tasks assessed/performed      Past Medical History  Diagnosis Date  . History of TIA (transient ischemic attack)     probable tia no residual  09-09-2012  . Gross hematuria   . Bladder neck contracture   . Urinary retention   . History of prostate cancer     S/P RADIOACTIVE SEED IMPLANTS  . Urethral stricture   . History of gout   . Macular degeneration     both eyes  . History of squamous cell carcinoma of skin     EXCISION LOWER LIP AND RIGHT EAR  . Dysrhythmia     A-FIB / HX BRADYCARDIA - FOLLOWED BY DR. [place]  . Anxiety   . Hyperlipidemia   . Hypertension   . Atrial fibrillation   . Arthritis     "in the fingers" per pt    Past Surgical History  Procedure Laterality Date  . Radioactive prostate seed implants  1998  . Total knee arthroplasty Right 11-23-2004  . Tonsillectomy  as child  . Cholecystectomy  1977  . Cataract extraction w/ intraocular lens  implant, bilateral    . Cystoscopy with urethral dilatation N/A 02/12/2013    Procedure: CYSTOSCOPY WITH BALLOON DILATATION OF URETHRAL STRICTURE AND BLADDER FULGERATION;  Surgeon: Bernestine Amass, MD;  Location: [facility];  Service: Urology;  Laterality: N/A;  . Laparoscopic  appendectomy N/A 08/22/2013    Procedure: APPENDECTOMY LAPAROSCOPIC;  Surgeon: Pedro Earls, MD;  Location: [facility];  Service: General;  Laterality: N/A;  . Appendectomy      There were no vitals filed for this visit.  Visit Diagnosis:  Abnormality of gait - Plan: PT plan of care cert/re-cert  Falls infrequently - Plan: PT plan of care cert/re-cert  Generalized weakness - Plan: PT plan of care cert/re-cert  Decreased range of motion - Plan: PT plan of care cert/re-cert      Subjective Assessment - 12/04/14 1150    Subjective one fall, B leg weakness, impaired balance   Pertinent History HTN, a-fib, R TKA   Patient Stated Goals decrease R knee pain and L buttock pain, improve balance, decrease lightheadedness   Currently in Pain? Yes   Pain Score --  4-5/10   Pain Location Knee   Pain Orientation Right   Pain Descriptors / Indicators Sharp   Pain Type Chronic pain   Pain Onset More than a month ago   Pain Frequency Constant   Aggravating Factors  knee flexion   Pain Relieving Factors rest   Multiple Pain Sites Yes   Pain Score 2  6-7/10 at worst   Pain Location Buttocks   Pain Orientation Left   Pain Descriptors /  Indicators Sharp   Pain Frequency Intermittent   Aggravating Factors  sitting directly on buttocks   Pain Relieving Factors rest            [facility] PT Assessment - 12/04/14 1159    Assessment   Medical Diagnosis History of falls, gait abnormality, fall   Onset Date 11/22/14   Prior Therapy PT for R TKA about 10 years ago.   Precautions   Precautions Fall, due to BERG score 43/56.   Restrictions   Weight Bearing Restrictions No   Balance Screen   Has the patient fallen in the past 6 months Yes   How many times? 1   Has the patient had a decrease in activity level because of a fear of falling?  No   Is the patient reluctant to leave their home because of a fear of falling?  No   Home Environment   Living Enviornment Other (Comment)  Independent  living facility   Living Arrangements Spouse/significant other   Available Help at Discharge Family   Type of [place] Access Level entry   [place] - single point;Grab bars - tub/shower;Grab bars - toilet;Shower seat - built in;Walker - 2 wheels  uses RW at night when using bathroom   Prior Function   Level of Independence Independent with basic ADLs;Independent with gait;Independent with transfers   Vocation Retired   Leisure [place] ind. living:  Wii bowling, exercise class 3-4x/week   Cognition   Overall Cognitive Status Within Functional Limits for tasks assessed   Observation/Other Assessments   Focus on Therapeutic Outcomes (FOTO)  FOTO physical fear: 51   Sensation   Light Touch Appears Intact   Additional Comments Denies N/T   Coordination   Gross Motor Movements are Fluid and Coordinated Yes   Fine Motor Movements are Fluid and Coordinated Yes   Posture/Postural Control   Posture/Postural Control Postural limitations   Postural Limitations Forward head;Posterior pelvic tilt   ROM / Strength   AROM / PROM / Strength AROM;Strength   AROM   Overall AROM  Deficits   Overall AROM Comments Pt unable to obtain full R knee ext (lacking approx. 5 degrees, AROM and PROM).    Strength   Overall Strength Deficits   Overall Strength Comments B UE WFL. B LE: 4/5, except hip abd/flexion: 3+/5.   Flexibility   Soft Tissue Assessment /Muscle Length yes   Hamstrings Decreased B hamstring flexibility   Palpation   Palpation Pain in L buttock over glut med and piriformis   Transfers   Transfers Sit to Stand;Stand to Sit   Sit to Stand 5: Supervision;With upper extremity assist;From chair/3-in-1   Stand to Sit 5: Supervision;Without upper extremity assist;To chair/3-in-1   Ambulation/Gait   Ambulation/Gait Yes   Ambulation/Gait Assistance 5: Supervision   Ambulation/Gait Assistance Details Pt reported lightheadedness but no overt  LOB noted.   Ambulation Distance (Feet) 100 Feet   Assistive device None   Gait Pattern Step-through pattern;Decreased stance time - right;Decreased stride length;Right flexed knee in stance;Antalgic;Decreased dorsiflexion - right;Decreased dorsiflexion - left   Ambulation Surface Level;Indoor   Gait velocity 3.41ft/sec.  no AD   Balance   Balance Assessed Yes   Standardized Balance Assessment   Standardized Balance Assessment Berg Balance Test;Timed Up and Go Test   Berg Balance Test   Sit to Stand Able to stand  independently using hands   Standing Unsupported Able to stand safely  2 minutes   Sitting with Back Unsupported but Feet Supported on Floor or Stool Able to sit safely and securely 2 minutes   Stand to Sit Sits safely with minimal use of hands   Transfers Able to transfer safely, definite need of hands   Standing Unsupported with Eyes Closed Able to stand 10 seconds with supervision   Standing Ubsupported with Feet Together Able to place feet together independently and stand for 1 minute with supervision   From Standing, Reach Forward with Outstretched Arm Can reach confidently >25 cm (10")   From Standing Position, Pick up Object from [place] to pick up shoe safely and easily   From Standing Position, Turn to Look Behind Over each Shoulder Looks behind one side only/other side shows less weight shift   Turn 360 Degrees Able to turn 360 degrees safely but slowly   Standing Unsupported, Alternately Place Feet on Step/Stool Able to complete 4 steps without aid or supervision   Standing Unsupported, One Foot in Front Able to plae foot ahead of the other independently and hold 30 seconds   Standing on One Leg Tries to lift leg/unable to hold 3 seconds but remains standing independently   Total Score 43   Timed Up and Go Test   TUG Normal TUG   Normal TUG (seconds) 10.9  no AD                           PT Education - 12/04/14 1316    Education provided  Yes   Education Details PT educated pt on PT frequency/duration and the results of BERG, gait speed and TUG test.   Person(s) Educated Patient   Methods Explanation   Comprehension Verbalized understanding          PT Short Term Goals - 12/04/14 1319    PT SHORT TERM GOAL #1   Title same as LTGs           PT Long Term Goals - 12/04/14 1319    PT LONG TERM GOAL #1   Title Pt will be independent in HEP to improve strength, reduce pain, improve balance and safety. Target date: 01/01/15.   Status New   PT LONG TERM GOAL #2   Title Pt will report no falls in the last 4 weeks to improve safety during functional mobililty. Target date: 01/01/15.   Status New   PT LONG TERM GOAL #3   Title Pt will improve BERG score to 51/56 to decrease falls risk. Target date: 01/01/15.   Status New   PT LONG TERM GOAL #4   Title Pt will ambulate 600' over even/uneven terrain (and turn) with LRAD, at MOD I level, to improve functional mobilty. Target date: 01/01/15.   Status New   PT LONG TERM GOAL #5   Title Pt will improve FOTO physical fear score by 10 points to improve quality of life. Target date: 01/01/15.   Status New   Additional Long Term Goals   Additional Long Term Goals Yes   PT LONG TERM GOAL #6   Title Pt will report R knee pain and L buttock pain </=2/10 in order to perform ADLs. Target date: 01/01/15.   Status New   PT LONG TERM GOAL #7   Title Perform DGI and write goal if appropriate. Target date: 01/01/15.   Status New               Plan - 12/04/14 1151  Clinical Impression Statement Pt is a pleasant 81y/o male presenting to OPPT neuro with impaired balance, one fall, and B LE weakness. Pt reported he fell when getting up from the commode, while using grab bar, pt reported B knees buckled. He reported B knees are getting progressively weaker over the last few months. Pt has history of R TKA. Pt reported he also has difficulty with performing turns during ambulation. Pt  reported he only uses SPC when ambulating longer distances, but doesn't use SPC at home. Pt reported he occasionally feels lightheadedness and is not sure what makes it worse.  Pt's BERG score indicates he is at a significant risk for falls. Pt's TUG time and gait speed are [facility]. However, pt has impaired balance during ambulation, decreased strength, and endurance.    Pt will benefit from skilled therapeutic intervention in order to improve on the following deficits Abnormal gait;Decreased endurance;Pain;Decreased strength;Decreased mobility;Decreased balance;Decreased knowledge of use of DME;Decreased range of motion;Impaired flexibility   Rehab Potential Good   PT Frequency 2x / week   PT Duration 4 weeks   PT Treatment/Interventions ADLs/Self Care Home Management;Gait training;Neuromuscular re-education;Stair training;Biofeedback;Functional mobility training;Patient/family education;Cryotherapy;Therapeutic activities;Electrical Stimulation;Therapeutic exercise;Manual techniques;Balance training;DME Instruction   PT Next Visit Plan Perform DGI, assess for orthostatic hypotension, initiate balance HEP   Consulted and Agree with Plan of Care Patient          G-Codes - 2014/12/16 1330    Functional Assessment Tool Used BERG: 43/56; gait speed no AD: 3.20ft/sec.; TUG no AD: 10.9sec.   Functional Limitation Mobility: Walking and moving around   Mobility: Walking and Moving Around Current Status [phone]) At least 40 percent but less than 60 percent impaired, limited or restricted   Mobility: Walking and Moving Around Goal Status [phone]) At least 1 percent but less than 20 percent impaired, limited or restricted       Problem List Patient Active Problem List   Diagnosis Date Noted  . Long term current use of anticoagulant therapy 09/19/2013  . Arthritis   . S/P laparoscopic appendectomy Jan 2015 08/28/2013  . Hyperlipidemia   . Hypertension   . Atrial fibrillation     Miller,Jennifer  L 12/16/14, 1:31 PM  [facility] [address] [place], [place], [address] Phone: [phone]   Fax:  [phone]    Geoffry Paradise, PT,DPT 12/16/14 1:31 PM Phone: [phone] Fax: [phone]

## 2014-12-11 ENCOUNTER — Ambulatory Visit: Payer: Medicare Other | Admitting: Rehabilitative and Restorative Service Providers"

## 2014-12-11 DIAGNOSIS — R296 Repeated falls: Secondary | ICD-10-CM | POA: Diagnosis not present

## 2014-12-11 DIAGNOSIS — R269 Unspecified abnormalities of gait and mobility: Secondary | ICD-10-CM

## 2014-12-11 DIAGNOSIS — R531 Weakness: Secondary | ICD-10-CM

## 2014-12-11 DIAGNOSIS — M2560 Stiffness of unspecified joint, not elsewhere classified: Secondary | ICD-10-CM | POA: Diagnosis not present

## 2014-12-11 NOTE — Patient Instructions (Signed)
Hamstring Stretch, Seated (Strap, Two Chairs)   Sit with one leg extended onto facing chair. *No strap needed.  Sit up tall and lean forward to tolerance. Hold for _10___ breaths. Repeat __2__ times each leg.  Copyright  VHI. All rights reserved.  Functional Quadriceps: Sit to Stand   Sit on edge of chair, feet flat on floor. Stand upright, extending knees fully. *use one hand for support* Repeat __10__ times per set. Do __1__ sets per session. Do __1-2__ sessions per day.  http://orth.exer.us/734   Copyright  VHI. All rights reserved.   Feet Together, Varied Arm Positions - Eyes Open   With eyes open, feet together, arms at your side, look straight ahead at a stationary object. Hold __20__ seconds, then close eyes for 20 seconds *picture below. Repeat __3__ times per session. Do __1-2__ sessions per day.  Copyright  VHI. All rights reserved.  Feet Together, Varied Arm Positions - Eyes Closed   Copyright  VHI. All rights reserved.

## 2014-12-11 NOTE — Therapy (Signed)
[facility] [address] [place], [place], [address] Phone: [phone]   Fax:  [phone]  Physical Therapy Treatment  Patient Details  Name: Justin Potter MRN: [id number] Date of Birth: 1926-07-08 Referring Provider:  Dorian Heckle, MD  Encounter Date: 12/11/2014      PT End of Session - 12/11/14 1430    Visit Number 2   Number of Visits 9   Date for PT Re-Evaluation 01/03/15   Authorization Type G-code every 10th visit.   PT Start Time 1235   PT Stop Time 1318   PT Time Calculation (min) 43 min   Equipment Utilized During Treatment Gait belt   Activity Tolerance Patient tolerated treatment well   Behavior During Therapy WFL for tasks assessed/performed      Past Medical History  Diagnosis Date  . History of TIA (transient ischemic attack)     probable tia no residual  09-09-2012  . Gross hematuria   . Bladder neck contracture   . Urinary retention   . History of prostate cancer     S/P RADIOACTIVE SEED IMPLANTS  . Urethral stricture   . History of gout   . Macular degeneration     both eyes  . History of squamous cell carcinoma of skin     EXCISION LOWER LIP AND RIGHT EAR  . Dysrhythmia     A-FIB / HX BRADYCARDIA - FOLLOWED BY DR. [place]  . Anxiety   . Hyperlipidemia   . Hypertension   . Atrial fibrillation   . Arthritis     "in the fingers" per pt    Past Surgical History  Procedure Laterality Date  . Radioactive prostate seed implants  1998  . Total knee arthroplasty Right 11-23-2004  . Tonsillectomy  as child  . Cholecystectomy  1977  . Cataract extraction w/ intraocular lens  implant, bilateral    . Cystoscopy with urethral dilatation N/A 02/12/2013    Procedure: CYSTOSCOPY WITH BALLOON DILATATION OF URETHRAL STRICTURE AND BLADDER FULGERATION;  Surgeon: Bernestine Amass, MD;  Location: [facility];  Service: Urology;  Laterality: N/A;  . Laparoscopic  appendectomy N/A 08/22/2013    Procedure: APPENDECTOMY LAPAROSCOPIC;  Surgeon: Pedro Earls, MD;  Location: [facility];  Service: General;  Laterality: N/A;  . Appendectomy      There were no vitals filed for this visit.  Visit Diagnosis:  Abnormality of gait  Generalized weakness      Subjective Assessment - 12/11/14 1239    Subjective The patient reports his knees have been giving way.  "I cheated a little on my rehab after the knee replacement years ago".   Patient reports quick turns make him feel "whoozy".     Currently in Pain? Yes   Pain Score 8   when sitting, and 4/10 when standing   Pain Location Buttocks   Pain Orientation Left   Pain Descriptors / Indicators Sharp   Pain Type Chronic pain   Pain Onset More than a month ago   Pain Frequency Constant   Aggravating Factors  worse with sitting   Pain Relieving Factors rest, use of vibration on bed        Gait: Gait emphasizing longer stride length, start/stops, faster pace with CGA for safety     [facility] PT Assessment - 12/11/14 1255    Standardized Balance Assessment   Standardized Balance Assessment Dynamic Gait Index   Dynamic Gait Index  Level Surface Mild Impairment   Change in Gait Speed Mild Impairment   Gait with Horizontal Head Turns Mild Impairment   Gait with Vertical Head Turns Mild Impairment   Gait and Pivot Turn Mild Impairment   Step Over Obstacle Normal   Step Around Obstacles Mild Impairment   Steps Mild Impairment   Total Score 17     NEUROMUSCULAR RE-EDUCATION: Standing balance exercises near countertop consisting of: Mini squats x 10 reps  Heel raises without UE support x 10 reps Single leg stance with UE support  Patient required supervision assist for all activities.  Corner balance exercises consisting of: Standing on  Level surface with feet together with eyes open and then closed x 20 seconds x 3 reps with supervision for safety.  THERAPEUTIC EXERCISE: Sit<>stand x 10 reps  with one UE support Supine L piriformis stretching Seated bilateral hamstring stretching Supine trunk rotation        PT Education - 12/11/14 1430    Education provided Yes   Education Details HEP: hamstring stretch, sit<>stand, corner balance exercises with eyes open/eyes closed   Person(s) Educated Patient   Methods Explanation   Comprehension Verbalized understanding          PT Short Term Goals - 12/04/14 1319    PT SHORT TERM GOAL #1   Title same as LTGs           PT Long Term Goals - 12/04/14 1319    PT LONG TERM GOAL #1   Title Pt will be independent in HEP to improve strength, reduce pain, improve balance and safety. Target date: 01/01/15.   Status New   PT LONG TERM GOAL #2   Title Pt will report no falls in the last 4 weeks to improve safety during functional mobililty. Target date: 01/01/15.   Status New   PT LONG TERM GOAL #3   Title Pt will improve BERG score to 51/56 to decrease falls risk. Target date: 01/01/15.   Status New   PT LONG TERM GOAL #4   Title Pt will ambulate 600' over even/uneven terrain (and turn) with LRAD, at MOD I level, to improve functional mobilty. Target date: 01/01/15.   Status New   PT LONG TERM GOAL #5   Title Pt will improve FOTO physical fear score by 10 points to improve quality of life. Target date: 01/01/15.   Status New   Additional Long Term Goals   Additional Long Term Goals Yes   PT LONG TERM GOAL #6   Title Pt will report R knee pain and L buttock pain </=2/10 in order to perform ADLs. Target date: 01/01/15.   Status New   PT LONG TERM GOAL #7   Title Perform DGI and write goal if appropriate. Target date: 01/01/15.   Status New               Plan - 12/11/14 1431    Clinical Impression Statement The patient tolerated treatment well today and HEP intiiated for stretching, strengthening and balance.  The patient had relief of L buttocks pain with stretching activities.  Pt scores 17/24 on DGI.     PT Next Visit  Plan Assess for orthostatic hypotension, review HEP, treat L hip/low back pain as it relates to mobility impairments, LE strengthening   Consulted and Agree with Plan of Care Patient        Problem List Patient Active Problem List   Diagnosis Date Noted  . Long term current use of  anticoagulant therapy 09/19/2013  . Arthritis   . S/P laparoscopic appendectomy Jan 2015 08/28/2013  . Hyperlipidemia   . Hypertension   . Atrial fibrillation     Alayjah Boehringer, PT 12/11/2014, 3:48 PM  [facility] [address] [place], [place], [address] Phone: [phone]   Fax:  [phone]

## 2014-12-15 ENCOUNTER — Ambulatory Visit: Payer: Medicare Other

## 2014-12-15 VITALS — BP 117/66 | HR 62

## 2014-12-15 DIAGNOSIS — M2560 Stiffness of unspecified joint, not elsewhere classified: Secondary | ICD-10-CM

## 2014-12-15 DIAGNOSIS — R296 Repeated falls: Secondary | ICD-10-CM | POA: Diagnosis not present

## 2014-12-15 DIAGNOSIS — R269 Unspecified abnormalities of gait and mobility: Secondary | ICD-10-CM

## 2014-12-15 DIAGNOSIS — R531 Weakness: Secondary | ICD-10-CM | POA: Diagnosis not present

## 2014-12-15 NOTE — Patient Instructions (Addendum)
HIP: Hamstrings - Short Sitting   Rest leg on raised surface or on floor. Keep knee straight. Lift chest. Hold _20-30__ seconds. Repeat with both legs. _3__ reps per set, _2-3__ sets per day, _7__ days per week  Copyright  VHI. All rights reserved.    **Log rolling: start in sitting and then lie on your side while bringing legs onto bed, then roll onto back. To get up from bed, roll onto side, then bring legs off edge of bed while using your arms to push up into seated position.  Piriformis Stretch   Lying on back, pull right knee toward opposite shoulder. Hold _30___ seconds. Repeat __3__ times. Do __2-3__ sessions per day.  http://gt2.exer.us/257   Copyright  VHI. All rights reserved.    Lower trunk stretch: Lie on your back and bend knees and bring both knees to the Right side. Place pillow under knees for comfort. Hold 30 seconds. Perform 3 times, 2-3 times a day.

## 2014-12-15 NOTE — Therapy (Signed)
[facility] [address] [place], [place], [address] Phone: [phone]   Fax:  [phone]  Physical Therapy Treatment  Patient Details  Name: Justin Potter MRN: [id number] Date of Birth: 03-23-1926 Referring Provider:  Dorian Heckle, MD  Encounter Date: 12/15/2014      PT End of Session - 12/15/14 1651    Visit Number 3   Number of Visits 9   Date for PT Re-Evaluation 01/03/15   Authorization Type G-code every 10th visit.   PT Start Time 1401   PT Stop Time 1447   PT Time Calculation (min) 46 min   Activity Tolerance Patient tolerated treatment well   Behavior During Therapy WFL for tasks assessed/performed      Past Medical History  Diagnosis Date  . History of TIA (transient ischemic attack)     probable tia no residual  09-09-2012  . Gross hematuria   . Bladder neck contracture   . Urinary retention   . History of prostate cancer     S/P RADIOACTIVE SEED IMPLANTS  . Urethral stricture   . History of gout   . Macular degeneration     both eyes  . History of squamous cell carcinoma of skin     EXCISION LOWER LIP AND RIGHT EAR  . Dysrhythmia     A-FIB / HX BRADYCARDIA - FOLLOWED BY DR. [place]  . Anxiety   . Hyperlipidemia   . Hypertension   . Atrial fibrillation   . Arthritis     "in the fingers" per pt    Past Surgical History  Procedure Laterality Date  . Radioactive prostate seed implants  1998  . Total knee arthroplasty Right 11-23-2004  . Tonsillectomy  as child  . Cholecystectomy  1977  . Cataract extraction w/ intraocular lens  implant, bilateral    . Cystoscopy with urethral dilatation N/A 02/12/2013    Procedure: CYSTOSCOPY WITH BALLOON DILATATION OF URETHRAL STRICTURE AND BLADDER FULGERATION;  Surgeon: Bernestine Amass, MD;  Location: [facility];  Service: Urology;  Laterality: N/A;  . Laparoscopic appendectomy N/A 08/22/2013    Procedure:  APPENDECTOMY LAPAROSCOPIC;  Surgeon: Pedro Earls, MD;  Location: [facility];  Service: General;  Laterality: N/A;  . Appendectomy      Filed Vitals:   12/15/14 1406 12/15/14 1410 12/15/14 1413  BP: 155/91 134/77 117/66  Pulse: 67 54 62  Positive for lightheadedness and decreased BP upon changing positions.  Visit Diagnosis:  Decreased range of motion  Abnormality of gait      Subjective Assessment - 12/15/14 1406    Subjective Pt denied falls or changes since last visit. Pt reports his butt has been giving him problems. Pt reported he walked quite a bit today, twice around his building.   Pertinent History HTN, a-fib, R TKA   Patient Stated Goals decrease R knee pain and L buttock pain, improve balance, decrease lightheadedness   Currently in Pain? Yes   Pain Score 5   1-2/10 standing, 9/10 when transitioning from  sit to stand   Pain Location Buttocks   Pain Orientation Left   Pain Descriptors / Indicators Sharp   Pain Type Chronic pain   Pain Onset More than a month ago   Pain Frequency Constant   Aggravating Factors  worse with sitting   Pain Relieving Factors rest     Manual Therapy: -PT performed massage to L piriformis.  -PT performed B  LTR stretch and L piriformis stretch 4x30-60sec. Holds. Pt reported decreased pain after manual therapy.   Therex: -Pt performed HEP, see pt instructions for details. Cues and demonstration for technique.  Neuro re-ed: orthostatic hypotension assessment. PT educated pt on waiting 30 sec. To 1 minute prior to standing and/or walking to allow lightheadedness to subside when changing position.                         PT Education - 12/15/14 1650    Education provided Yes   Education Details Streteches and log rolling technique.   Person(s) Educated Patient   Methods Explanation;Demonstration;Handout;Verbal cues;Tactile cues   Comprehension Verbalized understanding;Returned demonstration          PT Short  Term Goals - 12/04/14 1319    PT SHORT TERM GOAL #1   Title same as LTGs           PT Long Term Goals - 12/15/14 1653    PT LONG TERM GOAL #1   Title Pt will be independent in HEP to improve strength, reduce pain, improve balance and safety. Target date: 01/01/15.   Status On-going   PT LONG TERM GOAL #2   Title Pt will report no falls in the last 4 weeks to improve safety during functional mobililty. Target date: 01/01/15.   Status On-going   PT LONG TERM GOAL #3   Title Pt will improve BERG score to 51/56 to decrease falls risk. Target date: 01/01/15.   Status On-going   PT LONG TERM GOAL #4   Title Pt will ambulate 600' over even/uneven terrain (and turn) with LRAD, at MOD I level, to improve functional mobilty. Target date: 01/01/15.   Status On-going   PT LONG TERM GOAL #5   Title Pt will improve FOTO physical fear score by 10 points to improve quality of life. Target date: 01/01/15.   Status On-going   Additional Long Term Goals   Additional Long Term Goals Yes   PT LONG TERM GOAL #6   Title Pt will report R knee pain and L buttock pain </=2/10 in order to perform ADLs. Target date: 01/01/15.   Status On-going   PT LONG TERM GOAL #7   Title Perform DGI and write goal if appropriate. Target date: 01/01/15.   Status Achieved   PT LONG TERM GOAL #8   Title Pt will improve DGI score to >/=20/24 to reduce falls risk. Target date: 01/01/15.   Status New               Plan - 12/15/14 1651    Clinical Impression Statement Pt demonstrated progress, as he reported L buttock pain was 1-2/10 at most at end of session, while performing sit to stand. Pt reported L buttock pain was 9/10 during sit to stand at beginning of session. Pt positive for orthostatic hypotension as BP decresaed by >9mmHg during supine to sit and sit to stand. Pt's BP was increased in supine, and he reported BP is usually lower but he takes BP meds at night. PT will send note to MD, to ensure MD is aware.  Continue with POC.   Pt will benefit from skilled therapeutic intervention in order to improve on the following deficits Abnormal gait;Decreased endurance;Pain;Decreased strength;Decreased mobility;Decreased balance;Decreased knowledge of use of DME;Decreased range of motion;Impaired flexibility   Rehab Potential Good   PT Frequency 2x / week   PT Duration 4 weeks   PT Treatment/Interventions ADLs/Self Care Home  Management;Gait training;Neuromuscular re-education;Stair training;Biofeedback;Functional mobility training;Patient/family education;Cryotherapy;Therapeutic activities;Electrical Stimulation;Therapeutic exercise;Manual techniques;Balance training;DME Instruction   PT Next Visit Plan Review HEP as needed and manual therapy as needed for L piriformis pain.   Consulted and Agree with Plan of Care Patient        Problem List Patient Active Problem List   Diagnosis Date Noted  . Long term current use of anticoagulant therapy 09/19/2013  . Arthritis   . S/P laparoscopic appendectomy Jan 2015 08/28/2013  . Hyperlipidemia   . Hypertension   . Atrial fibrillation     Irlene Crudup L 12/15/2014, 4:58 PM  [facility] [address] [place], [place], [address] Phone: [phone]   Fax:  [phone]    Geoffry Paradise, PT,DPT 12/15/2014 4:58 PM Phone: [phone] Fax: [phone]

## 2014-12-16 ENCOUNTER — Ambulatory Visit: Payer: Medicare Other | Admitting: Rehabilitative and Restorative Service Providers"

## 2014-12-16 DIAGNOSIS — R531 Weakness: Secondary | ICD-10-CM | POA: Diagnosis not present

## 2014-12-16 DIAGNOSIS — R269 Unspecified abnormalities of gait and mobility: Secondary | ICD-10-CM

## 2014-12-16 DIAGNOSIS — M2560 Stiffness of unspecified joint, not elsewhere classified: Secondary | ICD-10-CM | POA: Diagnosis not present

## 2014-12-16 DIAGNOSIS — R296 Repeated falls: Secondary | ICD-10-CM | POA: Diagnosis not present

## 2014-12-16 NOTE — Therapy (Signed)
[facility] [address] [place], [place], [address] Phone: [phone]   Fax:  [phone]  Physical Therapy Treatment  Patient Details  Name: Justin Potter MRN: [id number] Date of Birth: 11-05-1925 Referring Provider:  Dorian Heckle, MD  Encounter Date: 12/16/2014      PT End of Session - 12/16/14 1202    Visit Number 4   Number of Visits 9   Date for PT Re-Evaluation 01/03/15   Authorization Type G-code every 10th visit.   PT Start Time 1108   PT Stop Time 1148   PT Time Calculation (min) 40 min   Equipment Utilized During Treatment Gait belt   Activity Tolerance Patient tolerated treatment well   Behavior During Therapy WFL for tasks assessed/performed      Past Medical History  Diagnosis Date  . History of TIA (transient ischemic attack)     probable tia no residual  09-09-2012  . Gross hematuria   . Bladder neck contracture   . Urinary retention   . History of prostate cancer     S/P RADIOACTIVE SEED IMPLANTS  . Urethral stricture   . History of gout   . Macular degeneration     both eyes  . History of squamous cell carcinoma of skin     EXCISION LOWER LIP AND RIGHT EAR  . Dysrhythmia     A-FIB / HX BRADYCARDIA - FOLLOWED BY DR. [place]  . Anxiety   . Hyperlipidemia   . Hypertension   . Atrial fibrillation   . Arthritis     "in the fingers" per pt    Past Surgical History  Procedure Laterality Date  . Radioactive prostate seed implants  1998  . Total knee arthroplasty Right 11-23-2004  . Tonsillectomy  as child  . Cholecystectomy  1977  . Cataract extraction w/ intraocular lens  implant, bilateral    . Cystoscopy with urethral dilatation N/A 02/12/2013    Procedure: CYSTOSCOPY WITH BALLOON DILATATION OF URETHRAL STRICTURE AND BLADDER FULGERATION;  Surgeon: Bernestine Amass, MD;  Location: [facility];  Service: Urology;  Laterality: N/A;  . Laparoscopic  appendectomy N/A 08/22/2013    Procedure: APPENDECTOMY LAPAROSCOPIC;  Surgeon: Pedro Earls, MD;  Location: [facility];  Service: General;  Laterality: N/A;  . Appendectomy      There were no vitals filed for this visit.  Visit Diagnosis:  Abnormality of gait  Generalized weakness      Subjective Assessment - 12/16/14 1107    Subjective The patient reports significant reduction in pain after stretching and manual activities yesterday.    The patient reports he was able to sit up to watch news.  He has been having to return to the bed typically due to significant pain with sitting.   Currently in Pain? Yes   Pain Score 1    Pain Location Buttocks   Pain Orientation Left   Pain Descriptors / Indicators Sharp   Pain Type Chronic pain   Pain Onset More than a month ago   Pain Frequency Constant   Aggravating Factors  worse at end range during stretching   Pain Relieving Factors rest     THERAPEUTIC EXERCISE: Straight leg raises sidelying x 5 reps R/L 1 set, then 8 reps on 2nd set R/L Supine SLR x 10 reps bilat LEs with eccentric lowering and tactile cues for full R knee extension Supine core stabilization rolling physioball towards bottom and  then return to legs extended on ball LE flexion from green physioball in supine with emphasis on core and hip strength, then added hip ER component to movement (look at inner ankle) x 10 reps Reviewed seated hamstring stretch and piriformis stretch recommending to avoid sharp pain at end range focusing on "gentle pull"   Gait: 180 degree turns with CGA while walking Backwards walking x 20 feet x 3 reps with cues on wider base of support with CGA Gait with emphasis on arm swing and heel strike + "power walking" x 345 ft x 2 times  NEUROMUSCULAR RE-EDUCATION: Single limb stance activities tapping 6" steps and moving contralateral LE between 6 and 12" surfaces without UE support with min A with cues on hips maintaining stable position with  decreased rotation Standing postural awareness rolling shoulders back and down x 5 reps        PT Education - 12/16/14 1200    Education provided Yes   Education Details Pt using SPC on R side for R knee; PT educated on using cane to support R LE, which may help with gait mechanics.   Person(s) Educated Patient   Methods Explanation;Demonstration   Comprehension Verbalized understanding;Returned demonstration          PT Short Term Goals - 12/04/14 1319    PT SHORT TERM GOAL #1   Title same as LTGs           PT Long Term Goals - 12/15/14 1653    PT LONG TERM GOAL #1   Title Pt will be independent in HEP to improve strength, reduce pain, improve balance and safety. Target date: 01/01/15.   Status On-going   PT LONG TERM GOAL #2   Title Pt will report no falls in the last 4 weeks to improve safety during functional mobililty. Target date: 01/01/15.   Status On-going   PT LONG TERM GOAL #3   Title Pt will improve BERG score to 51/56 to decrease falls risk. Target date: 01/01/15.   Status On-going   PT LONG TERM GOAL #4   Title Pt will ambulate 600' over even/uneven terrain (and turn) with LRAD, at MOD I level, to improve functional mobilty. Target date: 01/01/15.   Status On-going   PT LONG TERM GOAL #5   Title Pt will improve FOTO physical fear score by 10 points to improve quality of life. Target date: 01/01/15.   Status On-going   Additional Long Term Goals   Additional Long Term Goals Yes   PT LONG TERM GOAL #6   Title Pt will report R knee pain and L buttock pain </=2/10 in order to perform ADLs. Target date: 01/01/15.   Status On-going   PT LONG TERM GOAL #7   Title Perform DGI and write goal if appropriate. Target date: 01/01/15.   Status Achieved   PT LONG TERM GOAL #8   Title Pt will improve DGI score to >/=20/24 to reduce falls risk. Target date: 01/01/15.   Status New               Plan - 12/16/14 1203    Clinical Impression Statement The patient  continued with relief of L buttock pain after manual therapy and stretching yesterday.  He has significant hip and LE weakness, which contribute to abnormal mechanics during gait.  Focused on hip strengthening to provide greater stability to address underlying cause of L buttock pain to maintain improvements made in P.T.    PT Next Visit Plan Manual  therapy as needed for L piriformis pain, balance training, review HEP as needed.   Consulted and Agree with Plan of Care Patient        Problem List Patient Active Problem List   Diagnosis Date Noted  . Long term current use of anticoagulant therapy 09/19/2013  . Arthritis   . S/P laparoscopic appendectomy Jan 2015 08/28/2013  . Hyperlipidemia   . Hypertension   . Atrial fibrillation     Sanya Kobrin, PT 12/16/2014, 12:07 PM  [facility] [address] [place], [place], [address] Phone: [phone]   Fax:  [phone]

## 2014-12-18 DIAGNOSIS — H3532 Exudative age-related macular degeneration: Secondary | ICD-10-CM | POA: Diagnosis not present

## 2014-12-18 DIAGNOSIS — Z961 Presence of intraocular lens: Secondary | ICD-10-CM | POA: Diagnosis not present

## 2014-12-18 DIAGNOSIS — H52203 Unspecified astigmatism, bilateral: Secondary | ICD-10-CM | POA: Diagnosis not present

## 2014-12-23 ENCOUNTER — Ambulatory Visit: Payer: Medicare Other | Attending: Internal Medicine

## 2014-12-23 DIAGNOSIS — R269 Unspecified abnormalities of gait and mobility: Secondary | ICD-10-CM | POA: Diagnosis not present

## 2014-12-23 DIAGNOSIS — R531 Weakness: Secondary | ICD-10-CM | POA: Diagnosis not present

## 2014-12-23 DIAGNOSIS — M2560 Stiffness of unspecified joint, not elsewhere classified: Secondary | ICD-10-CM | POA: Diagnosis not present

## 2014-12-23 DIAGNOSIS — R296 Repeated falls: Secondary | ICD-10-CM | POA: Diagnosis not present

## 2014-12-23 NOTE — Therapy (Signed)
[facility] [address] [place], [place], [address] Phone: [phone]   Fax:  [phone]  Physical Therapy Treatment  Patient Details  Name: Justin Potter MRN: [id number] Date of Birth: [birth year] Referring Provider:  Dorian Heckle, MD  Encounter Date: 12/23/2014      PT End of Session - 12/23/14 1334    Visit Number 5   Number of Visits 9   Date for PT Re-Evaluation 01/03/15   Authorization Type G-code every 10th visit.   PT Start Time 1016   PT Stop Time 1059   PT Time Calculation (min) 43 min   Activity Tolerance Patient tolerated treatment well   Behavior During Therapy WFL for tasks assessed/performed      Past Medical History  Diagnosis Date  . History of TIA (transient ischemic attack)     probable tia no residual  09-09-2012  . Gross hematuria   . Bladder neck contracture   . Urinary retention   . History of prostate cancer     S/P RADIOACTIVE SEED IMPLANTS  . Urethral stricture   . History of gout   . Macular degeneration     both eyes  . History of squamous cell carcinoma of skin     EXCISION LOWER LIP AND RIGHT EAR  . Dysrhythmia     A-FIB / HX BRADYCARDIA - FOLLOWED BY DR. Winfield  . Anxiety   . Hyperlipidemia   . Hypertension   . Atrial fibrillation   . Arthritis     "in the fingers" per pt    Past Surgical History  Procedure Laterality Date  . Radioactive prostate seed implants  1998  . Total knee arthroplasty Right 11-23-2004  . Tonsillectomy  as child  . Cholecystectomy  1977  . Cataract extraction w/ intraocular lens  implant, bilateral    . Cystoscopy with urethral dilatation N/A 02/12/2013    Procedure: CYSTOSCOPY WITH BALLOON DILATATION OF URETHRAL STRICTURE AND BLADDER FULGERATION;  Surgeon: Bernestine Amass, MD;  Location: [facility];  Service: Urology;  Laterality: N/A;  . Laparoscopic appendectomy N/A 08/22/2013    Procedure:  APPENDECTOMY LAPAROSCOPIC;  Surgeon: Pedro Earls, MD;  Location: [facility];  Service: General;  Laterality: N/A;  . Appendectomy      There were no vitals filed for this visit.  Visit Diagnosis:  Generalized weakness  Decreased range of motion  Abnormality of gait      Subjective Assessment - 12/23/14 1020    Subjective Pt denied falls or changes since last visit. Pt reported B groin pain decreased, and then started during sit<>stands. He reported L buttock pain began again yesterday while sitting through a program but it ceased today.   Pertinent History HTN, a-fib, R TKA   Patient Stated Goals decrease R knee pain and L buttock pain, improve balance, decrease lightheadedness   Currently in Pain? Yes   Pain Score 2    Pain Location Buttocks   Pain Orientation Left   Pain Descriptors / Indicators Sharp   Pain Type Chronic pain   Pain Onset More than a month ago   Pain Frequency Intermittent   Aggravating Factors  during sitting >45 minutes   Pain Relieving Factors sanding      Manual Therapy: -PT performed massage to L piriformis.  -PT performed B LTR stretch and L piriformis stretch 2x30-60sec. Holds. Pt reported decreased pain (0/10) after manual therapy.  Therex: in //bars with  1-2 UE support. Cues for technique. -Sidestepping 4x7' without band and 4x7' with yellow band around ankles. -Standing B hip ext 3x10/LE. -Standing B hamstring curls 3x10/LE. -Supine R LTR (to stretch L side) 2x30 second hold. -Supine L piriformis stretch 2x30second hold. -Seated hamstring curls 3x10/LE. Vc's to decrease hip flexion. PT also demonstrated proper technique for log roll to reduce lumbar rotation and strain on back.                           PT Education - 12/23/14 1334    Education provided Yes   Education Details PT reviewed LTR and pirifromis stretches and educated pt on proper technique.   Person(s) Educated Patient   Methods  Explanation;Demonstration;Verbal cues   Comprehension Verbalized understanding;Returned demonstration          PT Short Term Goals - 12/04/14 1319    PT SHORT TERM GOAL #1   Title same as LTGs           PT Long Term Goals - 12/15/14 1653    PT LONG TERM GOAL #1   Title Pt will be independent in HEP to improve strength, reduce pain, improve balance and safety. Target date: 01/01/15.   Status On-going   PT LONG TERM GOAL #2   Title Pt will report no falls in the last 4 weeks to improve safety during functional mobililty. Target date: 01/01/15.   Status On-going   PT LONG TERM GOAL #3   Title Pt will improve BERG score to 51/56 to decrease falls risk. Target date: 01/01/15.   Status On-going   PT LONG TERM GOAL #4   Title Pt will ambulate 600' over even/uneven terrain (and turn) with LRAD, at MOD I level, to improve functional mobilty. Target date: 01/01/15.   Status On-going   PT LONG TERM GOAL #5   Title Pt will improve FOTO physical fear score by 10 points to improve quality of life. Target date: 01/01/15.   Status On-going   Additional Long Term Goals   Additional Long Term Goals Yes   PT LONG TERM GOAL #6   Title Pt will report R knee pain and L buttock pain </=2/10 in order to perform ADLs. Target date: 01/01/15.   Status On-going   PT LONG TERM GOAL #7   Title Perform DGI and write goal if appropriate. Target date: 01/01/15.   Status Achieved   PT LONG TERM GOAL #8   Title Pt will improve DGI score to >/=20/24 to reduce falls risk. Target date: 01/01/15.   Status New               Plan - 12/23/14 1335    Clinical Impression Statement Pt reported 0/10 L buttock pain after manual therapy. Pt demonstrating progress towards goals, as he was able to tolerate standing therex after manual therapy. Pt unable to use SPC in L hand, as he reported it feels awkward. Pt would continue to benefit from skilled PT to reduce pain, improve strength and flexibility. Continue with POC.    Pt will benefit from skilled therapeutic intervention in order to improve on the following deficits Abnormal gait;Decreased endurance;Pain;Decreased strength;Decreased mobility;Decreased balance;Decreased knowledge of use of DME;Decreased range of motion;Impaired flexibility   Rehab Potential Good   PT Frequency 2x / week   PT Duration 4 weeks   PT Treatment/Interventions ADLs/Self Care Home Management;Gait training;Neuromuscular re-education;Stair training;Biofeedback;Functional mobility training;Patient/family education;Cryotherapy;Therapeutic activities;Electrical Stimulation;Therapeutic exercise;Manual techniques;Balance training;DME Instruction   PT  Next Visit Plan Provide LE strengthening HEP and core strengthening. Gait training.   Consulted and Agree with Plan of Care Patient        Problem List Patient Active Problem List   Diagnosis Date Noted  . Long term current use of anticoagulant therapy 09/19/2013  . Arthritis   . S/P laparoscopic appendectomy Jan 2015 08/28/2013  . Hyperlipidemia   . Hypertension   . Atrial fibrillation     Earlin Sweeden L 12/23/2014, 1:38 PM  [facility] [address] [place], [place], [address] Phone: [phone]   Fax:  [phone]     Geoffry Paradise, PT,DPT 12/23/2014 1:38 PM Phone: [phone] Fax: [phone]

## 2014-12-24 ENCOUNTER — Ambulatory Visit: Payer: Medicare Other | Admitting: Physical Therapy

## 2014-12-25 ENCOUNTER — Ambulatory Visit: Payer: Medicare Other

## 2014-12-25 DIAGNOSIS — R296 Repeated falls: Secondary | ICD-10-CM | POA: Diagnosis not present

## 2014-12-25 DIAGNOSIS — M2560 Stiffness of unspecified joint, not elsewhere classified: Secondary | ICD-10-CM | POA: Diagnosis not present

## 2014-12-25 DIAGNOSIS — R269 Unspecified abnormalities of gait and mobility: Secondary | ICD-10-CM

## 2014-12-25 DIAGNOSIS — R531 Weakness: Secondary | ICD-10-CM | POA: Diagnosis not present

## 2014-12-25 NOTE — Therapy (Signed)
[facility] [address] [place], [place], [address] Phone: [phone]   Fax:  [phone]  Physical Therapy Treatment  Patient Details  Name: Justin Potter MRN: [id number] Date of Birth: 09-05-1925 Referring Provider:  Dorian Heckle, MD  Encounter Date: 12/25/2014      PT End of Session - 12/25/14 1444    Visit Number 6   Number of Visits 9   Date for PT Re-Evaluation 01/03/15   Authorization Type G-code every 10th visit.   PT Start Time 1400   PT Stop Time 1443   PT Time Calculation (min) 43 min   Activity Tolerance Patient tolerated treatment well   Behavior During Therapy WFL for tasks assessed/performed      Past Medical History  Diagnosis Date  . History of TIA (transient ischemic attack)     probable tia no residual  09-09-2012  . Gross hematuria   . Bladder neck contracture   . Urinary retention   . History of prostate cancer     S/P RADIOACTIVE SEED IMPLANTS  . Urethral stricture   . History of gout   . Macular degeneration     both eyes  . History of squamous cell carcinoma of skin     EXCISION LOWER LIP AND RIGHT EAR  . Dysrhythmia     A-FIB / HX BRADYCARDIA - FOLLOWED BY DR. Manson  . Anxiety   . Hyperlipidemia   . Hypertension   . Atrial fibrillation   . Arthritis     "in the fingers" per pt    Past Surgical History  Procedure Laterality Date  . Radioactive prostate seed implants  1998  . Total knee arthroplasty Right 11-23-2004  . Tonsillectomy  as child  . Cholecystectomy  1977  . Cataract extraction w/ intraocular lens  implant, bilateral    . Cystoscopy with urethral dilatation N/A 02/12/2013    Procedure: CYSTOSCOPY WITH BALLOON DILATATION OF URETHRAL STRICTURE AND BLADDER FULGERATION;  Surgeon: Bernestine Amass, MD;  Location: [facility];  Service: Urology;  Laterality: N/A;  . Laparoscopic appendectomy N/A 08/22/2013    Procedure:  APPENDECTOMY LAPAROSCOPIC;  Surgeon: Pedro Earls, MD;  Location: [facility];  Service: General;  Laterality: N/A;  . Appendectomy      There were no vitals filed for this visit.  Visit Diagnosis:  Generalized weakness  Abnormality of gait      Subjective Assessment - 12/25/14 1402    Subjective Pt denied falls or changes since last visit. Pt reported he felt good after last session, pain reduced. Pt reported he still has pain when he sits for prolonged periods of time.   Pertinent History HTN, a-fib, R TKA   Patient Stated Goals decrease R knee pain and L buttock pain, improve balance, decrease lightheadedness   Currently in Pain? Yes   Pain Score 2   0/10 in standing   Pain Location Buttocks   Pain Orientation Left   Pain Descriptors / Indicators Sharp   Pain Type Chronic pain   Pain Onset More than a month ago   Pain Frequency Intermittent   Aggravating Factors  sitting    Pain Relieving Factors standing       Therex:  Pt performed all HEP listed in pt instruction, please see pt instructions for details. Pt required demonstration, VC's and tactile cues for technique and to improve upright posture and eccentric control.  PT Education - 12/25/14 1444    Education provided Yes   Education Details Balance and stregthening HEP.   Person(s) Educated Patient   Methods Explanation;Demonstration;Verbal cues;Handout;Tactile cues   Comprehension Verbalized understanding;Returned demonstration          PT Short Term Goals - 12/04/14 1319    PT SHORT TERM GOAL #1   Title same as LTGs           PT Long Term Goals - 12/15/14 1653    PT LONG TERM GOAL #1   Title Pt will be independent in HEP to improve strength, reduce pain, improve balance and safety. Target date: 01/01/15.   Status On-going   PT LONG TERM GOAL #2   Title Pt will report no falls in the last 4 weeks to improve safety during functional mobililty. Target date:  01/01/15.   Status On-going   PT LONG TERM GOAL #3   Title Pt will improve BERG score to 51/56 to decrease falls risk. Target date: 01/01/15.   Status On-going   PT LONG TERM GOAL #4   Title Pt will ambulate 600' over even/uneven terrain (and turn) with LRAD, at MOD I level, to improve functional mobilty. Target date: 01/01/15.   Status On-going   PT LONG TERM GOAL #5   Title Pt will improve FOTO physical fear score by 10 points to improve quality of life. Target date: 01/01/15.   Status On-going   Additional Long Term Goals   Additional Long Term Goals Yes   PT LONG TERM GOAL #6   Title Pt will report R knee pain and L buttock pain </=2/10 in order to perform ADLs. Target date: 01/01/15.   Status On-going   PT LONG TERM GOAL #7   Title Perform DGI and write goal if appropriate. Target date: 01/01/15.   Status Achieved   PT LONG TERM GOAL #8   Title Pt will improve DGI score to >/=20/24 to reduce falls risk. Target date: 01/01/15.   Status New               Plan - 12/25/14 1444    Clinical Impression Statement Pt tolerated strenthening HEP well, including seated therex. Pt may need to switch to performing hip IR/ER in prone to reduce L bottock pain. Pt continues to require cues to not cross legs and to continue log rolling vs. sit<>supine to reduce strain on low back. Continue with POC.   Pt will benefit from skilled therapeutic intervention in order to improve on the following deficits Abnormal gait;Decreased endurance;Pain;Decreased strength;Decreased mobility;Decreased balance;Decreased knowledge of use of DME;Decreased range of motion;Impaired flexibility   Rehab Potential Good   PT Frequency 2x / week   PT Duration 4 weeks   PT Next Visit Plan Review strengthening HEP as needed. Core strengthening and dynamic gait training over even/uneven terrain.   Consulted and Agree with Plan of Care Patient        Problem List Patient Active Problem List   Diagnosis Date Noted  .  Long term current use of anticoagulant therapy 09/19/2013  . Arthritis   . S/P laparoscopic appendectomy Jan 2015 08/28/2013  . Hyperlipidemia   . Hypertension   . Atrial fibrillation     Miller,Jennifer L 12/25/2014, 3:10 PM  [facility] [address] [place], [place], [address] Phone: [phone]   Fax:  [phone]     Geoffry Paradise, PT,DPT 12/25/2014 3:10 PM Phone: [phone] Fax: [phone]

## 2014-12-25 NOTE — Patient Instructions (Signed)
   Abduction: Clam (Eccentric) - Side-Lying   Lie on side with knees bent, tie yellow band above your knees. Lift top knee, keeping feet together. Keep trunk steady and hips forward. _10__ reps per set, _3__ sets per day, _3__ days per week.    Copyright  VHI. All rights reserved.    http://orth.exer.us/683   Copyright  VHI. All rights reserved.     "I love a Press photographer and march each leg 10 times, tie yellow band around ankles. Repeat __3__ times. Do __3-4__ sessions per week.  http://gt2.exer.us/345   Copyright  VHI. All rights reserved.    EXTENSION: Standing (Active)   Stand, both feet flat, tie red band around ankles. Draw right leg behind body as far as possible. Hold onto counter. Complete _3__ sets of _10__ repetitions. Perform _3-4__ sessions per week.  http://gtsc.exer.us/77   Copyright  VHI. All rights reserved.   Side-Stepping   Walk to left side with eyes open, tie red band around ankles. Take 10 even steps, leading with same foot. Make sure each foot lifts off the floor. Repeat in opposite direction. Repeat for __4__ times per session. Do __1__ sessions per day. Repeat on compliant surface: ________.   Copyright  VHI. All rights reserved.  Backward   Walk backwards with eyes open. Take 10 even steps, making sure each foot lifts off floor. Repeat for _4___ times per session. Do _1___ sessions per day.   Copyright  VHI. All rights reserved.    (Clinic) Hip: Internal Rotation - Sitting   Tie yellow band around right ankle, leg in, sit with knees over edge of table. Pull foot out. May hold table to keep body still. Repeat __10__ times per set. Do __3__ sets per session. Do __3__ sessions per week.   Copyright  VHI. All rights reserved.    (Clinic) Hip: External Rotation - Sitting   Tie yellow band around ankle, leg out, sit with knees over edge of table/sofa/chair. Pull foot toward body. May hold table to keep  body still. Repeat __10__ times per set. Do _3___ sets per session. Do __3__ sessions per week.   Copyright  VHI. All rights reserved.

## 2014-12-29 ENCOUNTER — Ambulatory Visit: Payer: Medicare Other | Admitting: Rehabilitative and Restorative Service Providers"

## 2014-12-29 DIAGNOSIS — R269 Unspecified abnormalities of gait and mobility: Secondary | ICD-10-CM

## 2014-12-29 DIAGNOSIS — R531 Weakness: Secondary | ICD-10-CM

## 2014-12-29 DIAGNOSIS — M2560 Stiffness of unspecified joint, not elsewhere classified: Secondary | ICD-10-CM | POA: Diagnosis not present

## 2014-12-29 DIAGNOSIS — R296 Repeated falls: Secondary | ICD-10-CM | POA: Diagnosis not present

## 2014-12-29 NOTE — Therapy (Signed)
[facility] [address] [place], [place], [address] Phone: [phone]   Fax:  [phone]  Physical Therapy Treatment  Patient Details  Name: Justin Potter MRN: [id number] Date of Birth: 01/13/1926 Referring Provider:  Dorian Heckle, MD  Encounter Date: 12/29/2014      PT End of Session - 12/29/14 1224    Visit Number 7   Number of Visits 9   Date for PT Re-Evaluation 01/03/15   Authorization Type G-code every 10th visit.   PT Start Time 1019   PT Stop Time 1104   PT Time Calculation (min) 45 min   Activity Tolerance Patient tolerated treatment well   Behavior During Therapy WFL for tasks assessed/performed      Past Medical History  Diagnosis Date  . History of TIA (transient ischemic attack)     probable tia no residual  09-09-2012  . Gross hematuria   . Bladder neck contracture   . Urinary retention   . History of prostate cancer     S/P RADIOACTIVE SEED IMPLANTS  . Urethral stricture   . History of gout   . Macular degeneration     both eyes  . History of squamous cell carcinoma of skin     EXCISION LOWER LIP AND RIGHT EAR  . Dysrhythmia     A-FIB / HX BRADYCARDIA - FOLLOWED BY DR. Sasakwa  . Anxiety   . Hyperlipidemia   . Hypertension   . Atrial fibrillation   . Arthritis     "in the fingers" per pt    Past Surgical History  Procedure Laterality Date  . Radioactive prostate seed implants  1998  . Total knee arthroplasty Right 11-23-2004  . Tonsillectomy  as child  . Cholecystectomy  1977  . Cataract extraction w/ intraocular lens  implant, bilateral    . Cystoscopy with urethral dilatation N/A 02/12/2013    Procedure: CYSTOSCOPY WITH BALLOON DILATATION OF URETHRAL STRICTURE AND BLADDER FULGERATION;  Surgeon: Bernestine Amass, MD;  Location: [facility];  Service: Urology;  Laterality: N/A;  . Laparoscopic appendectomy N/A 08/22/2013    Procedure:  APPENDECTOMY LAPAROSCOPIC;  Surgeon: Pedro Earls, MD;  Location: [facility];  Service: General;  Laterality: N/A;  . Appendectomy      There were no vitals filed for this visit.  Visit Diagnosis:  Generalized weakness  Abnormality of gait      Subjective Assessment - 12/29/14 1221    Subjective "I have some questions about my exercises from last session."  Patient wishes to review his prior HEP.  He does not notice increased pain with more focused strengthening of hips.   Currently in Pain? Yes   Pain Score 1    Pain Location Buttocks   Pain Orientation Left   Pain Descriptors / Indicators Sharp   Pain Type Chronic pain   Pain Onset More than a month ago   Pain Frequency Intermittent   Aggravating Factors  sitting   Pain Relieving Factors standing            [facility] PT Assessment - 12/29/14 1057    Berg Balance Test   Sit to Stand Able to stand  independently using hands   Standing Unsupported Able to stand safely 2 minutes   Sitting with Back Unsupported but Feet Supported on Floor or Stool Able to sit safely and securely 2 minutes   Stand to Sit Sits safely with minimal use  of hands   Transfers Able to transfer safely, definite need of hands   Standing Unsupported with Eyes Closed Able to stand 10 seconds safely   Standing Ubsupported with Feet Together Able to place feet together independently and stand 1 minute safely   From Standing, Reach Forward with Outstretched Arm Can reach confidently >25 cm (10")   From Standing Position, Pick up Object from Floor Able to pick up shoe safely and easily   From Standing Position, Turn to Look Behind Over each Shoulder Looks behind one side only/other side shows less weight shift   Turn 360 Degrees Able to turn 360 degrees safely in 4 seconds or less   Standing Unsupported, Alternately Place Feet on Step/Stool Able to stand independently and safely and complete 8 steps in 20 seconds   Standing Unsupported, One Foot in Front Able to  plae foot ahead of the other independently and hold 30 seconds   Standing on One Leg Tries to lift leg/unable to hold 3 seconds but remains standing independently   Total Score 49      THERAPEUTIC EXERCISE: Reviewed prior HEP including: sidelying hip abduction clam shells with yellow t-band x 10 R/L Seated resisted hip IR/ER yellow t-band x 10 R/L Standing hip extension x 10 R/L with red t-band Standing side step x 8 R/L with red t/band Standing marches x 10 R/L Backwards walking x 10 steps Sit<>stand x 10       PT Education - 12/29/14 1223    Education provided Yes   Education Details Reviewed all prior HEP, recommended d/c of feet together exercise with eyes open/closed as no longer challenging   Person(s) Educated Patient   Methods Explanation;Demonstration;Verbal cues   Comprehension Returned demonstration;Verbalized understanding          PT Short Term Goals - 12/04/14 1319    PT SHORT TERM GOAL #1   Title same as LTGs           PT Long Term Goals - 12/29/14 1021    PT LONG TERM GOAL #1   Title Pt will be independent in HEP to improve strength, reduce pain, improve balance and safety. Target date: 01/01/15.   Status On-going   PT LONG TERM GOAL #2   Title Pt will report no falls in the last 4 weeks to improve safety during functional mobililty. Target date: 01/01/15.   Baseline Met on 12/29/2014   Status Achieved   PT LONG TERM GOAL #3   Title Pt will improve BERG score to 51/56 to decrease falls risk. Target date: 01/01/15.   Baseline Pt scored 49/56 on 12/29/2014   Status On-going   PT LONG TERM GOAL #4   Title Pt will ambulate 600' over even/uneven terrain (and turn) with LRAD, at MOD I level, to improve functional mobilty. Target date: 01/01/15.   Status On-going   PT LONG TERM GOAL #5   Title Pt will improve FOTO physical fear score by 10 points to improve quality of life. Target date: 01/01/15.   Status On-going   PT LONG TERM GOAL #6   Title Pt will report  R knee pain and L buttock pain </=2/10 in order to perform ADLs. Target date: 01/01/15.   Baseline L buttock pain 1/10 L buttock, R knee pain is absent at rest.   Status Achieved   PT LONG TERM GOAL #7   Title Perform DGI and write goal if appropriate. Target date: 01/01/15.   Status Achieved   PT LONG TERM  GOAL #8   Title Pt will improve DGI score to >/=20/24 to reduce falls risk. Target date: 01/01/15.   Status On-going               Plan - 12/29/14 1228    Clinical Impression Statement The patient has met 2 LTGs.  PT to continue towards other LTGs and update as indicated.  The patient is tolerating LE strengthening activities well with decreased pain.  He continues with difficulty with single limb and narrow base of support standing.   PT Next Visit Plan Check LTGs; discuss continuing plan of care for PT, update goals as indicated.   Consulted and Agree with Plan of Care Patient        Problem List Patient Active Problem List   Diagnosis Date Noted  . Long term current use of anticoagulant therapy 09/19/2013  . Arthritis   . S/P laparoscopic appendectomy Jan 2015 08/28/2013  . Hyperlipidemia   . Hypertension   . Atrial fibrillation     Lorain Fettes, PT 12/29/2014, 12:31 PM  [facility] [address] [place], [place], [address] Phone: [phone]   Fax:  [phone]

## 2014-12-30 ENCOUNTER — Ambulatory Visit: Payer: Medicare Other

## 2014-12-30 DIAGNOSIS — R269 Unspecified abnormalities of gait and mobility: Secondary | ICD-10-CM | POA: Diagnosis not present

## 2014-12-30 DIAGNOSIS — M2560 Stiffness of unspecified joint, not elsewhere classified: Secondary | ICD-10-CM

## 2014-12-30 DIAGNOSIS — R531 Weakness: Secondary | ICD-10-CM | POA: Diagnosis not present

## 2014-12-30 DIAGNOSIS — Z9181 History of falling: Secondary | ICD-10-CM

## 2014-12-30 DIAGNOSIS — R296 Repeated falls: Secondary | ICD-10-CM | POA: Diagnosis not present

## 2014-12-30 NOTE — Therapy (Addendum)
[facility] [address] [place], [place], [address] Phone: [phone]   Fax:  [phone]  Physical Therapy Treatment  Patient Details  Name: Justin Potter MRN: [id number] Date of Birth: 08-04-1925 Referring Provider:  Dorian Heckle, MD  Encounter Date: 12/30/2014      PT End of Session - 12/30/14 1606    Visit Number 8   Number of Visits 9   Date for PT Re-Evaluation 01/03/15   Authorization Type G-code every 10th visit.   PT Start Time 1101   PT Stop Time 1145   PT Time Calculation (min) 44 min   Activity Tolerance Patient tolerated treatment well   Behavior During Therapy WFL for tasks assessed/performed      Past Medical History  Diagnosis Date  . History of TIA (transient ischemic attack)     probable tia no residual  09-09-2012  . Gross hematuria   . Bladder neck contracture   . Urinary retention   . History of prostate cancer     S/P RADIOACTIVE SEED IMPLANTS  . Urethral stricture   . History of gout   . Macular degeneration     both eyes  . History of squamous cell carcinoma of skin     EXCISION LOWER LIP AND RIGHT EAR  . Dysrhythmia     A-FIB / HX BRADYCARDIA - FOLLOWED BY DR. [place]  . Anxiety   . Hyperlipidemia   . Hypertension   . Atrial fibrillation   . Arthritis     "in the fingers" per pt    Past Surgical History  Procedure Laterality Date  . Radioactive prostate seed implants  1998  . Total knee arthroplasty Right 11-23-2004  . Tonsillectomy  as child  . Cholecystectomy  1977  . Cataract extraction w/ intraocular lens  implant, bilateral    . Cystoscopy with urethral dilatation N/A 02/12/2013    Procedure: CYSTOSCOPY WITH BALLOON DILATATION OF URETHRAL STRICTURE AND BLADDER FULGERATION;  Surgeon: Bernestine Amass, MD;  Location: [facility];  Service: Urology;  Laterality: N/A;  . Laparoscopic appendectomy N/A 08/22/2013    Procedure:  APPENDECTOMY LAPAROSCOPIC;  Surgeon: Pedro Earls, MD;  Location: [facility];  Service: General;  Laterality: N/A;  . Appendectomy      There were no vitals filed for this visit.  Visit Diagnosis:  Abnormality of gait  Generalized weakness  Decreased range of motion  Falls infrequently      Subjective Assessment - 12/30/14 1103    Subjective Pt denied falls or changes from yesterday. Pt reported he feels better about strengthening HEP but has a a few questions.   Pertinent History HTN, a-fib, R TKA   Patient Stated Goals decrease R knee pain and L buttock pain, improve balance, decrease lightheadedness   Currently in Pain? Yes   Pain Score 0-No pain  L buttocks pain: 4-5/10 when in the chair in lobby but pain is now gone            [facility] PT Assessment - 12/29/14 1057    Berg Balance Test   Sit to Stand Able to stand  independently using hands   Standing Unsupported Able to stand safely 2 minutes   Sitting with Back Unsupported but Feet Supported on Floor or Stool Able to sit safely and securely 2 minutes   Stand to Sit Sits safely with minimal use of hands   Transfers Able to transfer safely, definite  need of hands   Standing Unsupported with Eyes Closed Able to stand 10 seconds safely   Standing Ubsupported with Feet Together Able to place feet together independently and stand 1 minute safely   From Standing, Reach Forward with Outstretched Arm Can reach confidently >25 cm (10")   From Standing Position, Pick up Object from Floor Able to pick up shoe safely and easily   From Standing Position, Turn to Look Behind Over each Shoulder Looks behind one side only/other side shows less weight shift   Turn 360 Degrees Able to turn 360 degrees safely in 4 seconds or less   Standing Unsupported, Alternately Place Feet on Step/Stool Able to stand independently and safely and complete 8 steps in 20 seconds   Standing Unsupported, One Foot in Front Able to plae foot ahead of the other  independently and hold 30 seconds   Standing on One Leg Tries to lift leg/unable to hold 3 seconds but remains standing independently   Total Score 49       FOTO physical fear score: 61              [facility] Adult PT Treatment/Exercise - 12/30/14 1132    Ambulation/Gait   Ambulation/Gait Yes   Ambulation/Gait Assistance 6: Modified independent (Device/Increase time)   Ambulation/Gait Assistance Details No overt LOB episodes noted, pt did decrease speed when taking initial step into grass to ensure safety.   Ambulation Distance (Feet) 700 Feet  outdoor/indoors   Assistive device Straight cane   Gait Pattern Step-through pattern;Decreased stride length;Right flexed knee in stance;Decreased dorsiflexion - right;Decreased dorsiflexion - left   Ambulation Surface Level;Indoor;Grass;Gravel;Paved;Outdoor;Unlevel   Gait velocity 3.84f/sec.  no AD   Standardized Balance Assessment   Standardized Balance Assessment Timed Up and Go Test;Dynamic Gait Index   Dynamic Gait Index   Level Surface Normal   Change in Gait Speed Mild Impairment   Gait with Horizontal Head Turns Normal   Gait with Vertical Head Turns Normal   Gait and Pivot Turn Normal   Step Over Obstacle Normal   Step Around Obstacles Normal   Steps Mild Impairment   Total Score 22   Timed Up and Go Test   TUG Normal TUG   Normal TUG (seconds) 10.32  no AD         Neuro re-ed: provided SLS and tandem balance HEP. Pt performed and PT provided cues for technique. See pt instructions for details.       PT Education - 12/30/14 1318    Education provided Yes   Education Details PT discussed d/c from PT today due to meeting goals and being pleased with progress, pt agreed. Reviewed hip IR/ER HEP and provided pt updated tandem/SLS HEP. PT educated pt on strengthening B hips and to perform stretches to decrease pain and to receive monthly massages to decrease L piriformis pain; and eventually hips/legs strength  improvement will decrease pain.   Person(s) Educated Patient   Methods Explanation;Demonstration;Verbal cues;Handout   Comprehension Verbalized understanding;Returned demonstration          PT Short Term Goals - 12/04/14 1319    PT SHORT TERM GOAL #1   Title same as LTGs           PT Long Term Goals - 12/30/14 1607    PT LONG TERM GOAL #1   Title Pt will be independent in HEP to improve strength, reduce pain, improve balance and safety. Target date: 01/01/15.   Status Achieved   PT LONG  TERM GOAL #2   Title Pt will report no falls in the last 4 weeks to improve safety during functional mobililty. Target date: 01/01/15.   Baseline Met on 12/29/2014   Status Achieved   PT LONG TERM GOAL #3   Title Pt will improve BERG score to 51/56 to decrease falls risk. Target date: 01/01/15.   Baseline Pt scored 49/56 on 12/29/2014   Status Partially Met   PT LONG TERM GOAL #4   Title Pt will ambulate 600' over even/uneven terrain (and turn) with LRAD, at MOD I level, to improve functional mobilty. Target date: 01/01/15.   Status Achieved   PT LONG TERM GOAL #5   Title Pt will improve FOTO physical fear score by 10 points to improve quality of life. Target date: 01/01/15.   Status Achieved   PT LONG TERM GOAL #6   Title Pt will report R knee pain and L buttock pain </=2/10 in order to perform ADLs. Target date: 01/01/15.   Baseline L buttock pain 1/10 L buttock, R knee pain is absent at rest.   Status Achieved   PT LONG TERM GOAL #7   Title Perform DGI and write goal if appropriate. Target date: 01/01/15.   Status Achieved   PT LONG TERM GOAL #8   Title Pt will improve DGI score to >/=20/24 to reduce falls risk. Target date: 01/01/15.   Status Achieved               Plan - 01/01/2015 1606    Clinical Impression Statement PT is discharging from PT today. Please see d/c summary for details.          G-Codes - 01/01/2015 1613    Functional Assessment Tool Used BERG: 49/56; gait speed no  AD: 3.50f/sec.; TUG no AD: 10.32sec.   Functional Limitation Mobility: Walking and moving around   Mobility: Walking and Moving Around Goal Status [phone] At least 1 percent but less than 20 percent impaired, limited or restricted   Mobility: Walking and Moving Around Discharge Status [phone] At least 1 percent but less than 20 percent impaired, limited or restricted      Problem List Patient Active Problem List   Diagnosis Date Noted  . Long term current use of anticoagulant therapy 09/19/2013  . Arthritis   . S/P laparoscopic appendectomy Jan 2015 08/28/2013  . Hyperlipidemia   . Hypertension   . Atrial fibrillation     Kenric Ginger L 606/04/2015 4:13 PM  [address] [address]Phone: [phone]  Fax:  [phone]  PHYSICAL THERAPY DISCHARGE SUMMARY  Visits from Start of Care: 8  Current functional level related to goals / functional outcomes:     PT Long Term Goals - [phone]    PT LONG TERM GOAL #1   Title Pt will be independent in HEP to improve strength, reduce pain, improve balance and safety. Target date: 01/01/15.   Status Achieved   PT LONG TERM GOAL #2   Title Pt will report no falls in the last 4 weeks to improve safety during functional mobililty. Target date: 01/01/15.   Baseline Met on 12/29/2014   Status Achieved   PT LONG TERM GOAL #3   Title Pt will improve BERG score to 51/56 to decrease falls risk. Target date: 01/01/15.   Baseline Pt scored 49/56 on 12/29/2014   Status Partially Met   PT LONG TERM GOAL #4   Title Pt will  ambulate 600' over even/uneven terrain (and turn) with LRAD, at MOD I level, to improve functional mobilty. Target date: 01/01/15.   Status Achieved   PT LONG TERM GOAL #5   Title Pt will improve FOTO physical fear score by 10 points to improve quality of life. Target date: 01/01/15.   Status Achieved   PT LONG TERM GOAL #6   Title Pt  will report R knee pain and L buttock pain </=2/10 in order to perform ADLs. Target date: 01/01/15.   Baseline L buttock pain 1/10 L buttock, R knee pain is absent at rest.   Status Achieved   PT LONG TERM GOAL #7   Title Perform DGI and write goal if appropriate. Target date: 01/01/15.   Status Achieved   PT LONG TERM GOAL #8   Title Pt will improve DGI score to >/=20/24 to reduce falls risk. Target date: 01/01/15.   Status Achieved        Remaining deficits: Intermittent L buttock pain   Education / Equipment: HEP and educated pt on receiving monthly massages to decrease L piriformis pain, until B hip strength improves.  Plan: Patient agrees to discharge.  Patient goals were met. Patient is being discharged due to meeting the stated rehab goals.  ?????       Geoffry Paradise, PT,DPT 12/30/2014 4:13 PM Phone: [phone] Fax: [phone]

## 2014-12-30 NOTE — Patient Instructions (Addendum)
Perform in corner with chair or at counter for support.  Feet Heel-Toe "Tandem", Varied Arm Positions - Eyes Open   With eyes open, right foot directly in front of the other, arms at your side, look straight ahead at a stationary object. Hold _30___ seconds. Repeat __3__ times per session, each leg. Do __1__ sessions per day.  Copyright  VHI. All rights reserved.  Single Leg - Eyes Open   Holding support, lift right leg while maintaining balance over other leg. Keep hips level. Progress to removing hands from support surface for longer periods of time. Hold_10-30___ seconds. Repeat __3__ times per session, each leg. Do __1__ sessions per day.  Copyright  VHI. All rights reserved.

## 2015-01-05 ENCOUNTER — Ambulatory Visit: Payer: Medicare Other | Admitting: Physical Therapy

## 2015-01-12 ENCOUNTER — Encounter: Payer: Federal, State, Local not specified - PPO | Admitting: Physical Therapy

## 2015-01-14 ENCOUNTER — Encounter: Payer: Federal, State, Local not specified - PPO | Admitting: Physical Therapy

## 2015-01-21 ENCOUNTER — Encounter: Payer: Federal, State, Local not specified - PPO | Admitting: Physical Therapy

## 2015-02-01 DIAGNOSIS — H35363 Drusen (degenerative) of macula, bilateral: Secondary | ICD-10-CM | POA: Diagnosis not present

## 2015-02-01 DIAGNOSIS — H3532 Exudative age-related macular degeneration: Secondary | ICD-10-CM | POA: Diagnosis not present

## 2015-02-01 DIAGNOSIS — H35051 Retinal neovascularization, unspecified, right eye: Secondary | ICD-10-CM | POA: Diagnosis not present

## 2015-03-08 DIAGNOSIS — H3531 Nonexudative age-related macular degeneration: Secondary | ICD-10-CM | POA: Diagnosis not present

## 2015-03-08 DIAGNOSIS — H3532 Exudative age-related macular degeneration: Secondary | ICD-10-CM | POA: Diagnosis not present

## 2015-03-08 DIAGNOSIS — H35051 Retinal neovascularization, unspecified, right eye: Secondary | ICD-10-CM | POA: Diagnosis not present

## 2015-04-12 DIAGNOSIS — D171 Benign lipomatous neoplasm of skin and subcutaneous tissue of trunk: Secondary | ICD-10-CM | POA: Diagnosis not present

## 2015-04-12 DIAGNOSIS — L570 Actinic keratosis: Secondary | ICD-10-CM | POA: Diagnosis not present

## 2015-04-12 DIAGNOSIS — L821 Other seborrheic keratosis: Secondary | ICD-10-CM | POA: Diagnosis not present

## 2015-04-12 DIAGNOSIS — Z85828 Personal history of other malignant neoplasm of skin: Secondary | ICD-10-CM | POA: Diagnosis not present

## 2015-04-12 DIAGNOSIS — D1801 Hemangioma of skin and subcutaneous tissue: Secondary | ICD-10-CM | POA: Diagnosis not present

## 2015-04-12 DIAGNOSIS — Z85820 Personal history of malignant melanoma of skin: Secondary | ICD-10-CM | POA: Diagnosis not present

## 2015-04-19 DIAGNOSIS — H35363 Drusen (degenerative) of macula, bilateral: Secondary | ICD-10-CM | POA: Diagnosis not present

## 2015-04-19 DIAGNOSIS — H3532 Exudative age-related macular degeneration: Secondary | ICD-10-CM | POA: Diagnosis not present

## 2015-04-19 DIAGNOSIS — H35051 Retinal neovascularization, unspecified, right eye: Secondary | ICD-10-CM | POA: Diagnosis not present

## 2015-04-19 DIAGNOSIS — H35351 Cystoid macular degeneration, right eye: Secondary | ICD-10-CM | POA: Diagnosis not present

## 2015-04-29 DIAGNOSIS — Z23 Encounter for immunization: Secondary | ICD-10-CM | POA: Diagnosis not present

## 2015-05-26 DIAGNOSIS — L240 Irritant contact dermatitis due to detergents: Secondary | ICD-10-CM | POA: Diagnosis not present

## 2015-05-26 DIAGNOSIS — Z85828 Personal history of other malignant neoplasm of skin: Secondary | ICD-10-CM | POA: Diagnosis not present

## 2015-06-10 DIAGNOSIS — C61 Malignant neoplasm of prostate: Secondary | ICD-10-CM | POA: Diagnosis not present

## 2015-06-10 DIAGNOSIS — I480 Paroxysmal atrial fibrillation: Secondary | ICD-10-CM | POA: Diagnosis not present

## 2015-06-10 DIAGNOSIS — E7800 Pure hypercholesterolemia, unspecified: Secondary | ICD-10-CM | POA: Diagnosis not present

## 2015-06-10 DIAGNOSIS — I779 Disorder of arteries and arterioles, unspecified: Secondary | ICD-10-CM | POA: Diagnosis not present

## 2015-06-10 DIAGNOSIS — I10 Essential (primary) hypertension: Secondary | ICD-10-CM | POA: Diagnosis not present

## 2015-07-28 DIAGNOSIS — H35351 Cystoid macular degeneration, right eye: Secondary | ICD-10-CM | POA: Diagnosis not present

## 2015-07-28 DIAGNOSIS — H35051 Retinal neovascularization, unspecified, right eye: Secondary | ICD-10-CM | POA: Diagnosis not present

## 2015-07-28 DIAGNOSIS — H353211 Exudative age-related macular degeneration, right eye, with active choroidal neovascularization: Secondary | ICD-10-CM | POA: Diagnosis not present

## 2015-07-28 DIAGNOSIS — H31009 Unspecified chorioretinal scars, unspecified eye: Secondary | ICD-10-CM | POA: Diagnosis not present

## 2015-10-01 DIAGNOSIS — N320 Bladder-neck obstruction: Secondary | ICD-10-CM | POA: Diagnosis not present

## 2015-10-01 DIAGNOSIS — Z0000 Encounter for general adult medical examination without abnormal findings: Secondary | ICD-10-CM | POA: Diagnosis not present

## 2015-10-01 DIAGNOSIS — Z8546 Personal history of malignant neoplasm of prostate: Secondary | ICD-10-CM | POA: Diagnosis not present

## 2015-10-08 ENCOUNTER — Ambulatory Visit: Payer: Medicare Other | Admitting: Occupational Therapy

## 2015-10-12 ENCOUNTER — Ambulatory Visit: Payer: Medicare Other | Admitting: Occupational Therapy

## 2015-10-13 DIAGNOSIS — L821 Other seborrheic keratosis: Secondary | ICD-10-CM | POA: Diagnosis not present

## 2015-10-13 DIAGNOSIS — Z85820 Personal history of malignant melanoma of skin: Secondary | ICD-10-CM | POA: Diagnosis not present

## 2015-10-13 DIAGNOSIS — D0471 Carcinoma in situ of skin of right lower limb, including hip: Secondary | ICD-10-CM | POA: Diagnosis not present

## 2015-10-13 DIAGNOSIS — L570 Actinic keratosis: Secondary | ICD-10-CM | POA: Diagnosis not present

## 2015-10-13 DIAGNOSIS — Z85828 Personal history of other malignant neoplasm of skin: Secondary | ICD-10-CM | POA: Diagnosis not present

## 2015-10-13 DIAGNOSIS — D1801 Hemangioma of skin and subcutaneous tissue: Secondary | ICD-10-CM | POA: Diagnosis not present

## 2015-10-13 DIAGNOSIS — D225 Melanocytic nevi of trunk: Secondary | ICD-10-CM | POA: Diagnosis not present

## 2015-10-20 ENCOUNTER — Ambulatory Visit: Payer: Medicare Other | Attending: Ophthalmology | Admitting: Occupational Therapy

## 2015-10-20 DIAGNOSIS — H542 Low vision, both eyes: Secondary | ICD-10-CM | POA: Diagnosis not present

## 2015-10-20 DIAGNOSIS — H53413 Scotoma involving central area, bilateral: Secondary | ICD-10-CM

## 2015-10-20 DIAGNOSIS — H543 Unqualified visual loss, both eyes: Secondary | ICD-10-CM

## 2015-10-21 NOTE — Therapy (Signed)
[facility] [address] [place], [place], [address] Phone: [phone]   Fax:  [phone]  Occupational Therapy Evaluation  Patient Details  Name: Justin Potter MRN: [id number] Date of Birth: 09/12/1925 Referring Provider: Dr. Zadie Rhine  Encounter Date: 10/20/2015      OT End of Session - 10/21/15 1158    Visit Number 1   Number of Visits 1   Authorization Type Medicare   OT Start Time 1320   OT Stop Time 1425   OT Time Calculation (min) 65 min   Activity Tolerance Patient tolerated treatment well   Behavior During Therapy [facility] for tasks assessed/performed      Past Medical History  Diagnosis Date  . History of TIA (transient ischemic attack)     probable tia no residual  09-09-2012  . Gross hematuria   . Bladder neck contracture   . Urinary retention   . History of prostate cancer     S/P RADIOACTIVE SEED IMPLANTS  . Urethral stricture   . History of gout   . Macular degeneration     both eyes  . History of squamous cell carcinoma of skin     EXCISION LOWER LIP AND RIGHT EAR  . Dysrhythmia     A-FIB / HX BRADYCARDIA - FOLLOWED BY DR. [place]  . Anxiety   . Hyperlipidemia   . Hypertension   . Atrial fibrillation   . Arthritis     "in the fingers" per pt    Past Surgical History  Procedure Laterality Date  . Radioactive prostate seed implants  1998  . Total knee arthroplasty Right 11-23-2004  . Tonsillectomy  as child  . Cholecystectomy  1977  . Cataract extraction w/ intraocular lens  implant, bilateral    . Cystoscopy with urethral dilatation N/A 02/12/2013    Procedure: CYSTOSCOPY WITH BALLOON DILATATION OF URETHRAL STRICTURE AND BLADDER FULGERATION;  Surgeon: Bernestine Amass, MD;  Location: [facility];  Service: Urology;  Laterality: N/A;  . Laparoscopic appendectomy N/A 08/22/2013    Procedure: APPENDECTOMY LAPAROSCOPIC;  Surgeon: Pedro Earls, MD;   Location: [facility];  Service: General;  Laterality: N/A;  . Appendectomy      There were no vitals filed for this visit.  Visit Diagnosis:  Low vision, both eyes - Plan: Ot plan of care cert/re-cert  [place] involving central area, bilateral - Plan: Ot plan of care cert/re-cert      Subjective Assessment - 10/21/15 1154    Subjective  pt with macular degeneration reports increased difficulty reading   Pertinent History see Epic   Patient Stated Goals to read easier   Currently in Pain? No/denies           [facility] OT Assessment - 10/21/15 0001    Assessment   Diagnosis macular degeneration   Referring Provider Dr. Zadie Rhine   Onset Date 08/02/15   Prior Therapy PT   Precautions   Precautions Fall   Balance Screen   Has the patient fallen in the past 6 months No   Has the patient had a decrease in activity level because of a fear of falling?  No   Is the patient reluctant to leave their home because of a fear of falling?  No   Home  Environment   Family/patient expects to be discharged to: Private residence   Living Arrangements Spouse/significant other   Available Help at [place]  One level   Lives With Spouse   Prior Function   Level of Independence Independent with basic ADLs  difficulty with shaving, combing    Leisure watch TV, sports , reading newspaper   ADL   ADL comments modified indpendent with all basic ADLs   IADL   Shopping Shops independently for small purchases  Drives short distances    Mobility   Mobility Status --  modified independent   Vision - History   Baseline Vision Wears glasses all the time   Visual History Macular degeneration   Patient Visual Report Central vision impairment   Vision Assessment   Vision Assessment Vision tested   Per MD/OD Report OD 20/320, OS CF   Reading Acuity 20/250   Comment Pt reports difficulty reading newspaper over last few months   Cognition   Overall Cognitive Status Within Functional  Limits for tasks assessed   Mini Mental State Exam  29/30          Pt demonstrates ability to spot read with 6.5 x magnifier with max difficulty following instruction. Therapist recommends CCTV. Pt reports he has a friend with a CCTV for trial.               OT Education - 10/21/15 1155    Education provided Yes   Education Details 6.5 x handheld magnifier and binocular spectacle use,eccentric viewing,  contact infor for [place] [facility] for [employer], broadcasting/film/video) Educated Patient;Child(ren)  son   Methods Explanation;Demonstration;Verbal cues;Handout   Comprehension Verbalized understanding;Returned demonstration                    Plan - 10/21/15 1150    Clinical Impression Statement Pt with macular degeneration presents with visual impairment which impedes performance of ADLs/IADLS. Pt can benefit from skilled occupational therapy to maximize independence with ADLS/IADLs.   Rehab Potential Good   OT Frequency One time visit   OT Duration 8 weeks   OT Treatment/Interventions Self-care/ADL training;Patient/family education;DME and/or AE instruction;Visual/perceptual remediation/compensation   Plan Pt/ son were povided with education on day of eval. No additional OT visits recommended at this time, therefore no goals were set. Pt/ son were provided with information regarding magnifier purchase and contact info for Center for [employer].   Consulted and Agree with Plan of Care Patient          G-Codes - November 14, 2015 1730    Functional Assessment Tool Used clinical impressions   Functional Limitation Self care   Self Care Current Status [phone]) At least 20 percent but less than 40 percent impaired, limited or restricted   Self Care Goal Status [id number]) At least 20 percent but less than 40 percent impaired, limited or restricted   Self Care Discharge Status [phone]) At least 20 percent but less than 40 percent impaired, limited or  restricted      Problem List Patient Active Problem List   Diagnosis Date Noted  . Long term current use of anticoagulant therapy 09/19/2013  . Arthritis   . S/P laparoscopic appendectomy Jan 2015 08/28/2013  . Hyperlipidemia   . Hypertension   . Atrial fibrillation ([place])     Trayton Szabo 10/21/2015, 12:08 PM Theone Murdoch, OTR/L [phone] Phone: [phone] 12:08 PM 10/21/2015 [address] [place], [place], [address] Phone: [phone]   Fax:  [phone]  Name: EMMERICK ANDO MRN: [id number] Date of Birth: 07-02-1926

## 2015-10-25 DIAGNOSIS — Z85828 Personal history of other malignant neoplasm of skin: Secondary | ICD-10-CM | POA: Diagnosis not present

## 2015-10-25 DIAGNOSIS — D0471 Carcinoma in situ of skin of right lower limb, including hip: Secondary | ICD-10-CM | POA: Diagnosis not present

## 2015-12-02 IMAGING — CT CT ABD-PELV W/ CM
1 of 3 series · 13 of 32 positions shown, 18 images · IV contrast (omnipaque)
Comparison: None.

CLINICAL DATA: Lower abdominal pain with nausea.

EXAM:
CT ABDOMEN AND PELVIS WITH CONTRAST
TECHNIQUE: Multidetector CT imaging of the abdomen and pelvis was performed
using the standard protocol following bolus administration of
intravenous contrast.
CONTRAST:  50mL OMNIPAQUE IOHEXOL 300 MG/ML SOLN, 100mL OMNIPAQUE
IOHEXOL 300 MG/ML SOLN

[Series 2: abd/pel with · axial · 0.86mm/px · z∈[+1166,+1581]mm · 13 of 95 slices shown, 18 images]
[im 6/95  soft-tissue]
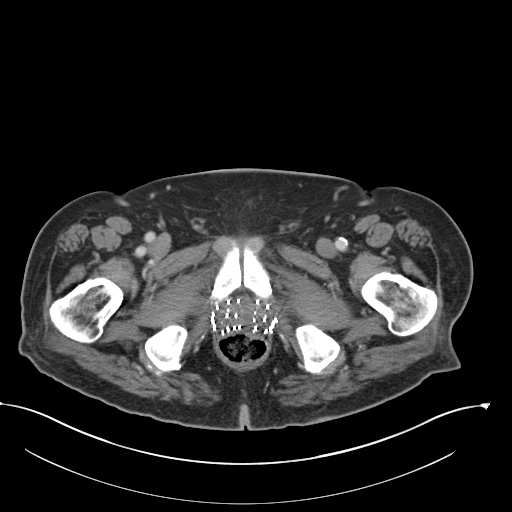
[im 6/95  bone]
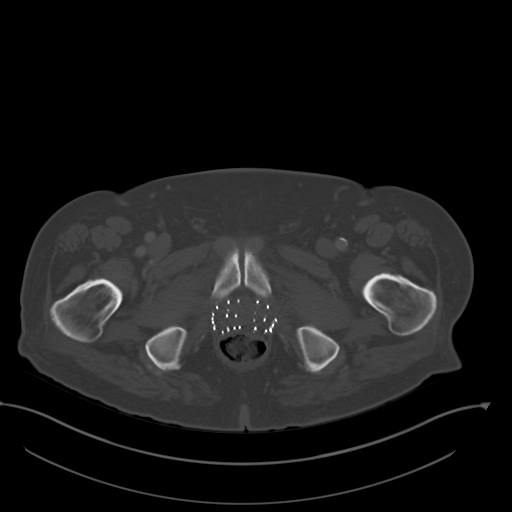
[im 16/95  soft-tissue]
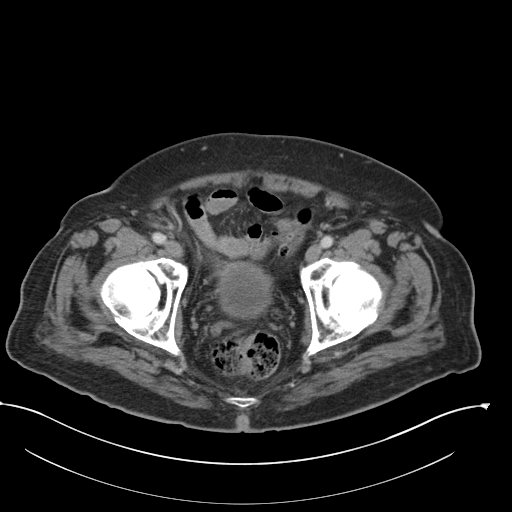
[im 21/95  soft-tissue]
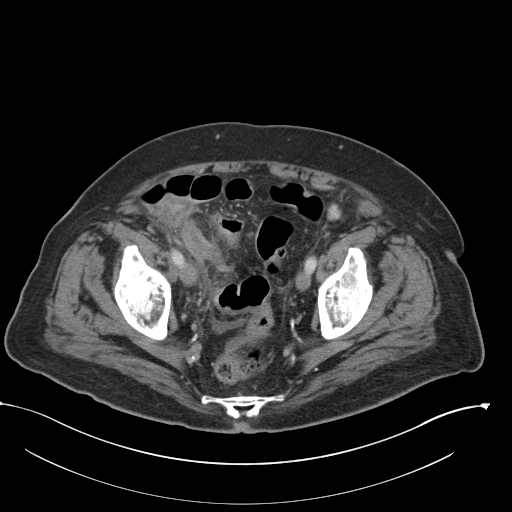
[im 27/95  soft-tissue]
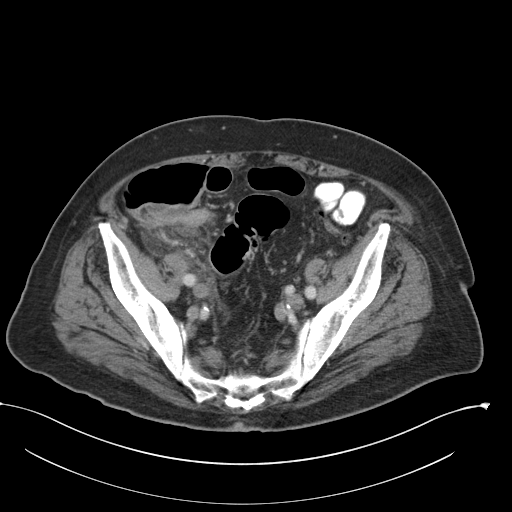
[im 37/95  soft-tissue]
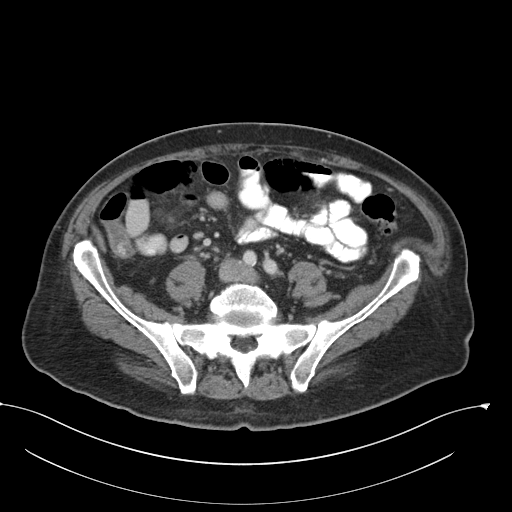
[im 42/95  soft-tissue]
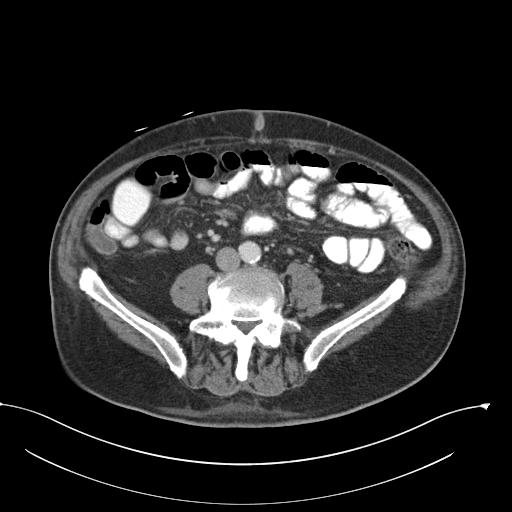
[im 53/95  soft-tissue]
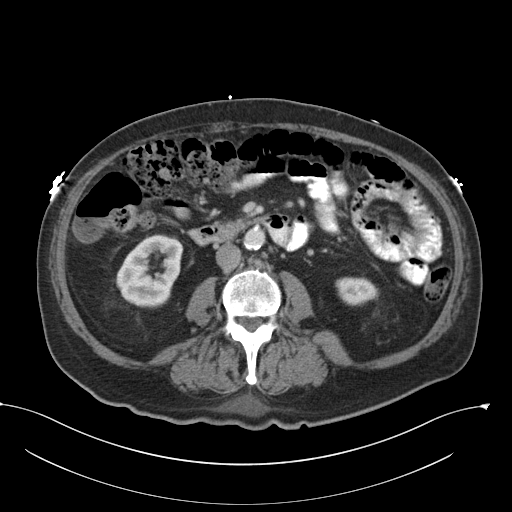
[im 58/95  soft-tissue]
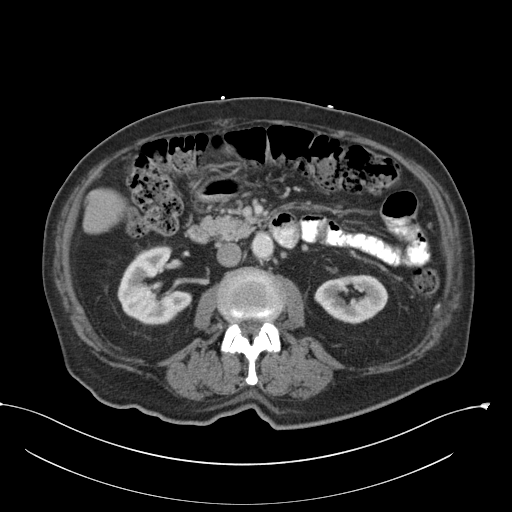
[im 68/95  soft-tissue]
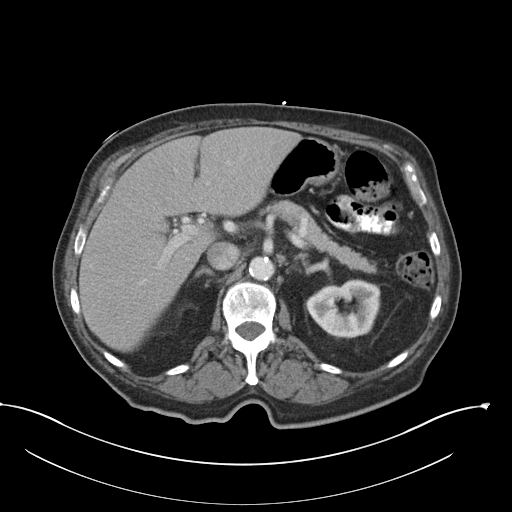
[im 68/95  bone]
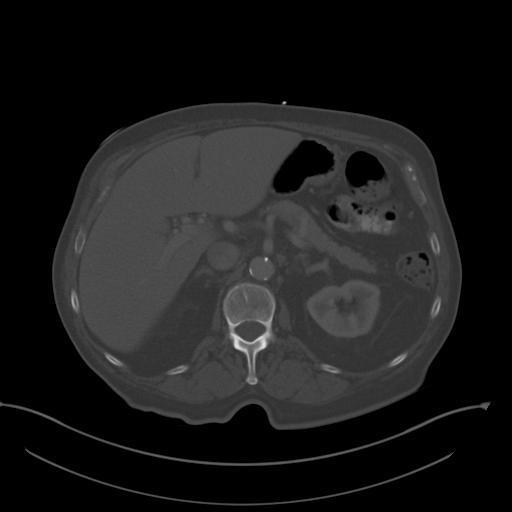
[im 74/95  soft-tissue]
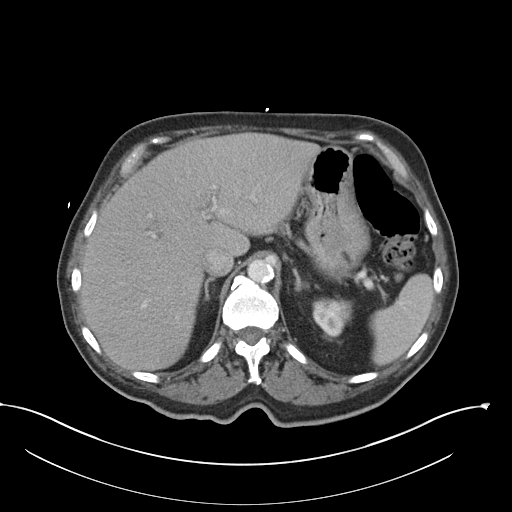
[im 74/95  lung]
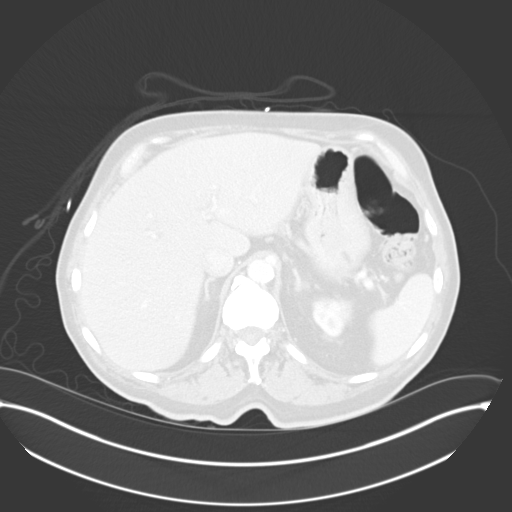
[im 79/95  soft-tissue]
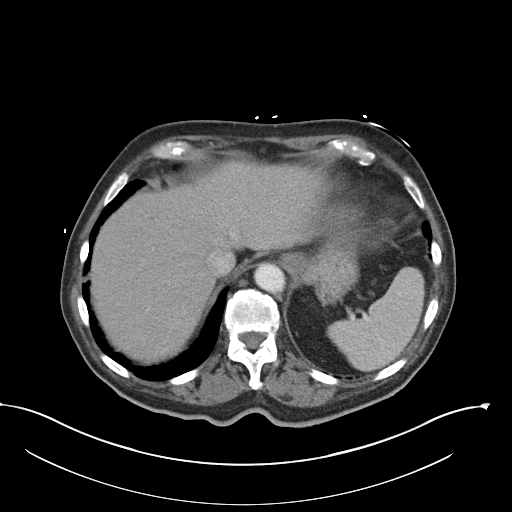
[im 79/95  lung]
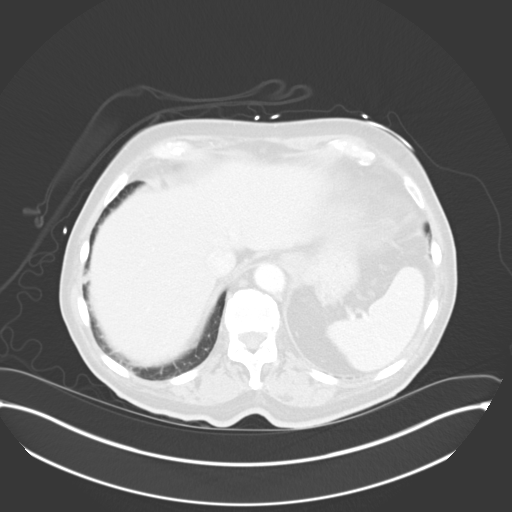
[im 84/95  lung]
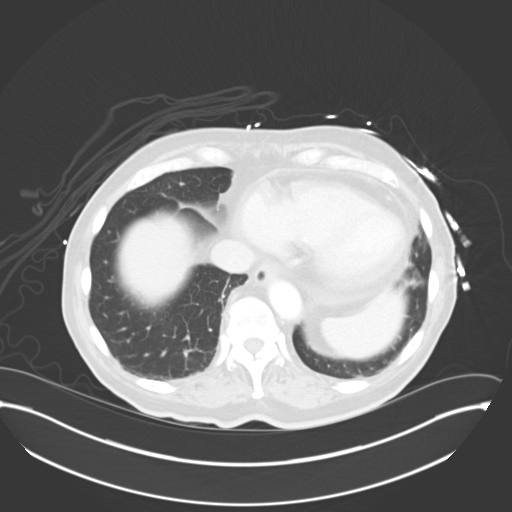
[im 89/95  soft-tissue]
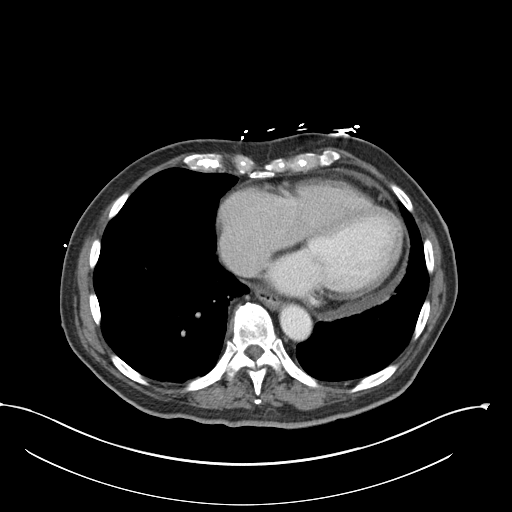
[im 89/95  lung]
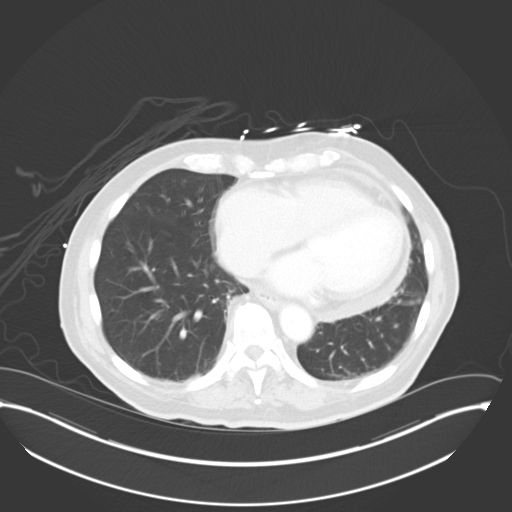

[13 of 32 positions shown; findings below may reference images not displayed]

FINDINGS: Lung bases demonstrate a 7 mm nodule over the lateral left lower
lobe. There is a 4 mm nodule opacity along the right major fissure.
There is mild cardiomegaly and a small amount of pericardial fluid
present.

Abdominal images demonstrate evidence of a prior cholecystectomy.
The liver, spleen, pancreas and adrenal glands are within normal.
Kidneys are normal in size without evidence of hydronephrosis or
nephrolithiasis. The ureters are within normal. There is
diverticulosis of the colon. Minimal calcified atherosclerotic
plaque involving the abdominal aorta.

In the right lower quadrant, there is an inflamed appendix measuring
1.5 cm in diameter with mucosal enhancement and adjacent
inflammation of the mesenteric fat and small amount of free fluid in
the right lower quadrant/ pelvis. There is no evidence of
perforation or abscess.

Remaining pelvic images are notable for radiation seed implants over
the prostate. There are mild degenerative changes of the spine and
hips.
IMPRESSION: Evidence of acute appendicitis. No evidence of perforation or
abscess.

Mild cardiomegaly and small amount of pericardial fluid.

Diverticulosis of the colon.

4 mm nodule along the right major fissure and a 7 mm nodule over the
lateral left lower lobe.

If the patient is at high risk for bronchogenic carcinoma, follow-up
chest CT at 3-7months is recommended. If the patient is at low risk
for bronchogenic carcinoma, follow-up chest CT at 6-12 months is
recommended. This recommendation follows the consensus statement:
Guidelines for Management of Small Pulmonary Nodules Detected on CT
Scans: A Statement from the [HOSPITAL] as published in

Critical Value/emergent results were called by telephone at the time
of interpretation on 08/20/2013 at [DATE] to Dr. HERMANN, MIRKA ,
who verbally acknowledged these results.

## 2015-12-03 DIAGNOSIS — H353134 Nonexudative age-related macular degeneration, bilateral, advanced atrophic with subfoveal involvement: Secondary | ICD-10-CM | POA: Diagnosis not present

## 2015-12-03 DIAGNOSIS — H31009 Unspecified chorioretinal scars, unspecified eye: Secondary | ICD-10-CM | POA: Diagnosis not present

## 2015-12-03 DIAGNOSIS — H35363 Drusen (degenerative) of macula, bilateral: Secondary | ICD-10-CM | POA: Diagnosis not present

## 2015-12-03 DIAGNOSIS — H353212 Exudative age-related macular degeneration, right eye, with inactive choroidal neovascularization: Secondary | ICD-10-CM | POA: Diagnosis not present

## 2015-12-22 DIAGNOSIS — Z961 Presence of intraocular lens: Secondary | ICD-10-CM | POA: Diagnosis not present

## 2015-12-22 DIAGNOSIS — H353233 Exudative age-related macular degeneration, bilateral, with inactive scar: Secondary | ICD-10-CM | POA: Diagnosis not present

## 2015-12-22 DIAGNOSIS — H5201 Hypermetropia, right eye: Secondary | ICD-10-CM | POA: Diagnosis not present

## 2015-12-27 DIAGNOSIS — I10 Essential (primary) hypertension: Secondary | ICD-10-CM | POA: Diagnosis not present

## 2015-12-27 DIAGNOSIS — G459 Transient cerebral ischemic attack, unspecified: Secondary | ICD-10-CM | POA: Diagnosis not present

## 2015-12-27 DIAGNOSIS — I480 Paroxysmal atrial fibrillation: Secondary | ICD-10-CM | POA: Diagnosis not present

## 2015-12-27 DIAGNOSIS — R7309 Other abnormal glucose: Secondary | ICD-10-CM | POA: Diagnosis not present

## 2015-12-27 DIAGNOSIS — Z1389 Encounter for screening for other disorder: Secondary | ICD-10-CM | POA: Diagnosis not present

## 2015-12-27 DIAGNOSIS — R269 Unspecified abnormalities of gait and mobility: Secondary | ICD-10-CM | POA: Diagnosis not present

## 2015-12-27 DIAGNOSIS — C61 Malignant neoplasm of prostate: Secondary | ICD-10-CM | POA: Diagnosis not present

## 2015-12-27 DIAGNOSIS — E7800 Pure hypercholesterolemia, unspecified: Secondary | ICD-10-CM | POA: Diagnosis not present

## 2015-12-27 DIAGNOSIS — M109 Gout, unspecified: Secondary | ICD-10-CM | POA: Diagnosis not present

## 2015-12-27 DIAGNOSIS — Z0000 Encounter for general adult medical examination without abnormal findings: Secondary | ICD-10-CM | POA: Diagnosis not present

## 2016-02-15 ENCOUNTER — Other Ambulatory Visit: Payer: Self-pay | Admitting: *Deleted

## 2016-02-15 MED ORDER — APIXABAN 5 MG PO TABS: 5.0000 mg | ORAL_TABLET | Freq: Two times a day (BID) | ORAL | 0 refills | Status: DC

## 2016-04-11 DIAGNOSIS — D485 Neoplasm of uncertain behavior of skin: Secondary | ICD-10-CM | POA: Diagnosis not present

## 2016-04-11 DIAGNOSIS — L821 Other seborrheic keratosis: Secondary | ICD-10-CM | POA: Diagnosis not present

## 2016-04-11 DIAGNOSIS — Z85828 Personal history of other malignant neoplasm of skin: Secondary | ICD-10-CM | POA: Diagnosis not present

## 2016-04-11 DIAGNOSIS — D0422 Carcinoma in situ of skin of left ear and external auricular canal: Secondary | ICD-10-CM | POA: Diagnosis not present

## 2016-04-11 DIAGNOSIS — L570 Actinic keratosis: Secondary | ICD-10-CM | POA: Diagnosis not present

## 2016-04-11 DIAGNOSIS — L400 Psoriasis vulgaris: Secondary | ICD-10-CM | POA: Diagnosis not present

## 2016-04-11 DIAGNOSIS — Z85820 Personal history of malignant melanoma of skin: Secondary | ICD-10-CM | POA: Diagnosis not present

## 2016-04-25 DIAGNOSIS — Z23 Encounter for immunization: Secondary | ICD-10-CM | POA: Diagnosis not present

## 2016-05-21 ENCOUNTER — Other Ambulatory Visit: Payer: Self-pay | Admitting: Interventional Cardiology

## 2016-05-26 ENCOUNTER — Telehealth: Payer: Self-pay | Admitting: Interventional Cardiology

## 2016-05-26 NOTE — Telephone Encounter (Signed)
Yes please, the patient is overdue for an appointment. Thanks, MI

## 2016-05-26 NOTE — Telephone Encounter (Signed)
Patient's pharmacy stated that the patient needed to see Dr. Tamala Julian before they could refill his medications.  Patient has enough med. To last until the end of this month.  I can schedule a f/u appt. With Dr. Tamala Julian if needed. Just let me know if I need to do that.

## 2016-05-29 ENCOUNTER — Other Ambulatory Visit: Payer: Self-pay | Admitting: *Deleted

## 2016-05-29 MED ORDER — APIXABAN 5 MG PO TABS: 5.0000 mg | ORAL_TABLET | Freq: Two times a day (BID) | ORAL | 0 refills | Status: DC

## 2016-06-05 DIAGNOSIS — S46812A Strain of other muscles, fascia and tendons at shoulder and upper arm level, left arm, initial encounter: Secondary | ICD-10-CM | POA: Diagnosis not present

## 2016-06-08 DIAGNOSIS — M6281 Muscle weakness (generalized): Secondary | ICD-10-CM | POA: Diagnosis not present

## 2016-06-08 DIAGNOSIS — M542 Cervicalgia: Secondary | ICD-10-CM | POA: Diagnosis not present

## 2016-06-08 DIAGNOSIS — M25512 Pain in left shoulder: Secondary | ICD-10-CM | POA: Diagnosis not present

## 2016-06-09 DIAGNOSIS — M25512 Pain in left shoulder: Secondary | ICD-10-CM | POA: Diagnosis not present

## 2016-06-09 DIAGNOSIS — M6281 Muscle weakness (generalized): Secondary | ICD-10-CM | POA: Diagnosis not present

## 2016-06-09 DIAGNOSIS — M542 Cervicalgia: Secondary | ICD-10-CM | POA: Diagnosis not present

## 2016-06-11 DIAGNOSIS — M25512 Pain in left shoulder: Secondary | ICD-10-CM | POA: Diagnosis not present

## 2016-06-11 DIAGNOSIS — M6281 Muscle weakness (generalized): Secondary | ICD-10-CM | POA: Diagnosis not present

## 2016-06-11 DIAGNOSIS — M542 Cervicalgia: Secondary | ICD-10-CM | POA: Diagnosis not present

## 2016-06-12 DIAGNOSIS — M25512 Pain in left shoulder: Secondary | ICD-10-CM | POA: Diagnosis not present

## 2016-06-12 DIAGNOSIS — M6281 Muscle weakness (generalized): Secondary | ICD-10-CM | POA: Diagnosis not present

## 2016-06-12 DIAGNOSIS — M542 Cervicalgia: Secondary | ICD-10-CM | POA: Diagnosis not present

## 2016-06-19 DIAGNOSIS — M25512 Pain in left shoulder: Secondary | ICD-10-CM | POA: Diagnosis not present

## 2016-06-19 DIAGNOSIS — M542 Cervicalgia: Secondary | ICD-10-CM | POA: Diagnosis not present

## 2016-06-19 DIAGNOSIS — M6281 Muscle weakness (generalized): Secondary | ICD-10-CM | POA: Diagnosis not present

## 2016-06-21 DIAGNOSIS — M542 Cervicalgia: Secondary | ICD-10-CM | POA: Diagnosis not present

## 2016-06-21 DIAGNOSIS — M25512 Pain in left shoulder: Secondary | ICD-10-CM | POA: Diagnosis not present

## 2016-06-21 DIAGNOSIS — M6281 Muscle weakness (generalized): Secondary | ICD-10-CM | POA: Diagnosis not present

## 2016-06-27 DIAGNOSIS — M25512 Pain in left shoulder: Secondary | ICD-10-CM | POA: Diagnosis not present

## 2016-06-27 DIAGNOSIS — M6281 Muscle weakness (generalized): Secondary | ICD-10-CM | POA: Diagnosis not present

## 2016-06-27 DIAGNOSIS — R2681 Unsteadiness on feet: Secondary | ICD-10-CM | POA: Diagnosis not present

## 2016-06-27 DIAGNOSIS — M542 Cervicalgia: Secondary | ICD-10-CM | POA: Diagnosis not present

## 2016-06-28 DIAGNOSIS — I10 Essential (primary) hypertension: Secondary | ICD-10-CM | POA: Diagnosis not present

## 2016-06-28 DIAGNOSIS — C61 Malignant neoplasm of prostate: Secondary | ICD-10-CM | POA: Diagnosis not present

## 2016-06-28 DIAGNOSIS — R269 Unspecified abnormalities of gait and mobility: Secondary | ICD-10-CM | POA: Diagnosis not present

## 2016-06-28 DIAGNOSIS — E7800 Pure hypercholesterolemia, unspecified: Secondary | ICD-10-CM | POA: Diagnosis not present

## 2016-06-28 DIAGNOSIS — I480 Paroxysmal atrial fibrillation: Secondary | ICD-10-CM | POA: Diagnosis not present

## 2016-06-29 DIAGNOSIS — M25512 Pain in left shoulder: Secondary | ICD-10-CM | POA: Diagnosis not present

## 2016-06-29 DIAGNOSIS — M6281 Muscle weakness (generalized): Secondary | ICD-10-CM | POA: Diagnosis not present

## 2016-06-29 DIAGNOSIS — M542 Cervicalgia: Secondary | ICD-10-CM | POA: Diagnosis not present

## 2016-06-29 DIAGNOSIS — R2681 Unsteadiness on feet: Secondary | ICD-10-CM | POA: Diagnosis not present

## 2016-07-05 DIAGNOSIS — R2681 Unsteadiness on feet: Secondary | ICD-10-CM | POA: Diagnosis not present

## 2016-07-05 DIAGNOSIS — M25512 Pain in left shoulder: Secondary | ICD-10-CM | POA: Diagnosis not present

## 2016-07-05 DIAGNOSIS — M6281 Muscle weakness (generalized): Secondary | ICD-10-CM | POA: Diagnosis not present

## 2016-07-05 DIAGNOSIS — M542 Cervicalgia: Secondary | ICD-10-CM | POA: Diagnosis not present

## 2016-07-06 DIAGNOSIS — M542 Cervicalgia: Secondary | ICD-10-CM | POA: Diagnosis not present

## 2016-07-06 DIAGNOSIS — M6281 Muscle weakness (generalized): Secondary | ICD-10-CM | POA: Diagnosis not present

## 2016-07-06 DIAGNOSIS — R2681 Unsteadiness on feet: Secondary | ICD-10-CM | POA: Diagnosis not present

## 2016-07-06 DIAGNOSIS — M25512 Pain in left shoulder: Secondary | ICD-10-CM | POA: Diagnosis not present

## 2016-07-11 DIAGNOSIS — M25512 Pain in left shoulder: Secondary | ICD-10-CM | POA: Diagnosis not present

## 2016-07-11 DIAGNOSIS — M6281 Muscle weakness (generalized): Secondary | ICD-10-CM | POA: Diagnosis not present

## 2016-07-11 DIAGNOSIS — R2681 Unsteadiness on feet: Secondary | ICD-10-CM | POA: Diagnosis not present

## 2016-07-11 DIAGNOSIS — M542 Cervicalgia: Secondary | ICD-10-CM | POA: Diagnosis not present

## 2016-07-12 DIAGNOSIS — H35051 Retinal neovascularization, unspecified, right eye: Secondary | ICD-10-CM | POA: Diagnosis not present

## 2016-07-12 DIAGNOSIS — M542 Cervicalgia: Secondary | ICD-10-CM | POA: Diagnosis not present

## 2016-07-12 DIAGNOSIS — R2681 Unsteadiness on feet: Secondary | ICD-10-CM | POA: Diagnosis not present

## 2016-07-12 DIAGNOSIS — M25512 Pain in left shoulder: Secondary | ICD-10-CM | POA: Diagnosis not present

## 2016-07-12 DIAGNOSIS — H353212 Exudative age-related macular degeneration, right eye, with inactive choroidal neovascularization: Secondary | ICD-10-CM | POA: Diagnosis not present

## 2016-07-12 DIAGNOSIS — H353134 Nonexudative age-related macular degeneration, bilateral, advanced atrophic with subfoveal involvement: Secondary | ICD-10-CM | POA: Diagnosis not present

## 2016-07-12 DIAGNOSIS — H35351 Cystoid macular degeneration, right eye: Secondary | ICD-10-CM | POA: Diagnosis not present

## 2016-07-12 DIAGNOSIS — M6281 Muscle weakness (generalized): Secondary | ICD-10-CM | POA: Diagnosis not present

## 2016-07-14 DIAGNOSIS — M25512 Pain in left shoulder: Secondary | ICD-10-CM | POA: Diagnosis not present

## 2016-07-14 DIAGNOSIS — M6281 Muscle weakness (generalized): Secondary | ICD-10-CM | POA: Diagnosis not present

## 2016-07-14 DIAGNOSIS — R2681 Unsteadiness on feet: Secondary | ICD-10-CM | POA: Diagnosis not present

## 2016-07-14 DIAGNOSIS — M542 Cervicalgia: Secondary | ICD-10-CM | POA: Diagnosis not present

## 2016-07-18 DIAGNOSIS — M542 Cervicalgia: Secondary | ICD-10-CM | POA: Diagnosis not present

## 2016-07-18 DIAGNOSIS — M6281 Muscle weakness (generalized): Secondary | ICD-10-CM | POA: Diagnosis not present

## 2016-07-18 DIAGNOSIS — M25512 Pain in left shoulder: Secondary | ICD-10-CM | POA: Diagnosis not present

## 2016-07-18 DIAGNOSIS — R2681 Unsteadiness on feet: Secondary | ICD-10-CM | POA: Diagnosis not present

## 2016-07-20 DIAGNOSIS — M25512 Pain in left shoulder: Secondary | ICD-10-CM | POA: Diagnosis not present

## 2016-07-20 DIAGNOSIS — R2681 Unsteadiness on feet: Secondary | ICD-10-CM | POA: Diagnosis not present

## 2016-07-20 DIAGNOSIS — M6281 Muscle weakness (generalized): Secondary | ICD-10-CM | POA: Diagnosis not present

## 2016-07-20 DIAGNOSIS — M542 Cervicalgia: Secondary | ICD-10-CM | POA: Diagnosis not present

## 2016-07-21 DIAGNOSIS — M542 Cervicalgia: Secondary | ICD-10-CM | POA: Diagnosis not present

## 2016-07-21 DIAGNOSIS — M6281 Muscle weakness (generalized): Secondary | ICD-10-CM | POA: Diagnosis not present

## 2016-07-21 DIAGNOSIS — M25512 Pain in left shoulder: Secondary | ICD-10-CM | POA: Diagnosis not present

## 2016-07-21 DIAGNOSIS — R2681 Unsteadiness on feet: Secondary | ICD-10-CM | POA: Diagnosis not present

## 2016-07-31 ENCOUNTER — Other Ambulatory Visit: Payer: Self-pay | Admitting: Interventional Cardiology

## 2016-08-18 ENCOUNTER — Ambulatory Visit: Payer: Federal, State, Local not specified - PPO | Admitting: Interventional Cardiology

## 2016-08-18 ENCOUNTER — Encounter: Payer: Self-pay | Admitting: Interventional Cardiology

## 2016-08-18 ENCOUNTER — Ambulatory Visit (INDEPENDENT_AMBULATORY_CARE_PROVIDER_SITE_OTHER): Payer: Medicare Other | Admitting: Interventional Cardiology

## 2016-08-18 VITALS — BP 170/72 | HR 57 | Ht 74.0 in | Wt 178.8 lb

## 2016-08-18 DIAGNOSIS — E784 Other hyperlipidemia: Secondary | ICD-10-CM

## 2016-08-18 DIAGNOSIS — Z7901 Long term (current) use of anticoagulants: Secondary | ICD-10-CM | POA: Diagnosis not present

## 2016-08-18 DIAGNOSIS — I482 Chronic atrial fibrillation, unspecified: Secondary | ICD-10-CM

## 2016-08-18 DIAGNOSIS — E7849 Other hyperlipidemia: Secondary | ICD-10-CM

## 2016-08-18 DIAGNOSIS — I10 Essential (primary) hypertension: Secondary | ICD-10-CM | POA: Diagnosis not present

## 2016-08-18 MED ORDER — ELIQUIS 5 MG PO TABS: 5.0000 mg | ORAL_TABLET | Freq: Two times a day (BID) | ORAL | 3 refills | Status: DC

## 2016-08-18 NOTE — Progress Notes (Signed)
Cardiology Office Note    Date:  08/18/2016   [name], DOB 05-12-1926, MRN [id number]  PCP:  [name], MD  Cardiologist: [name], MD   Chief Complaint  Patient presents with  . Follow-up  . Atrial Fibrillation    History of Present Illness:  Justin Potter is a 83 y.o. male for follow-up of atrial fibrillation and anticoagulation therapy.   Justin Potter has chronic atrial fibrillation. He denies syncope. He has difficulty with vision and balance. This is decreased his independence. He is not exercising as much. He doesn't believe he has had hematuria, melena, or hematochezia. He denies orthopnea, PND, and lower extremity swelling.   Past Medical History:  Diagnosis Date  . Anxiety   . Arthritis    "in the fingers" per pt  . Atrial fibrillation ([place])   . Bladder neck contracture   . Dysrhythmia    A-FIB / HX BRADYCARDIA - FOLLOWED BY DR. [place]  . Gross hematuria   . History of gout   . History of prostate cancer    S/P RADIOACTIVE SEED IMPLANTS  . History of squamous cell carcinoma of skin    EXCISION LOWER LIP AND RIGHT EAR  . History of TIA (transient ischemic attack)    probable tia no residual  09-09-2012  . Hyperlipidemia   . Hypertension   . Macular degeneration    both eyes  . Urethral stricture   . Urinary retention     Past Surgical History:  Procedure Laterality Date  . APPENDECTOMY    . CATARACT EXTRACTION W/ INTRAOCULAR LENS  IMPLANT, BILATERAL    . CHOLECYSTECTOMY  1977  . CYSTOSCOPY WITH URETHRAL DILATATION N/A 02/12/2013   Procedure: CYSTOSCOPY WITH BALLOON DILATATION OF URETHRAL STRICTURE AND BLADDER FULGERATION;  Surgeon: Bernestine Amass, MD;  Location: [facility];  Service: Urology;  Laterality: N/A;  . LAPAROSCOPIC APPENDECTOMY N/A 08/22/2013   Procedure: APPENDECTOMY LAPAROSCOPIC;  Surgeon: Pedro Earls, MD;  Location: [facility];  Service: General;  Laterality: N/A;  . [place]  . TONSILLECTOMY  as child  . TOTAL KNEE ARTHROPLASTY Right 11-23-2004    Current Medications: Outpatient Medications Prior to Visit  Medication Sig Dispense Refill  . amLODipine (NORVASC) 5 MG tablet Take 5 mg by mouth every evening.     Marland Kitchen atorvastatin (LIPITOR) 10 MG tablet Take 10 mg by mouth every evening.     . clonazePAM (KLONOPIN) 0.5 MG tablet Take 0.5 mg by mouth 2 (two) times daily as needed for anxiety.     . docusate sodium (COLACE) 100 MG capsule Take 100 mg by mouth 2 (two) times daily. Reported on 10/20/2015    . Multiple Vitamins-Minerals (OCUVITE PRESERVISION PO) Take 1 tablet by mouth every morning.     Marland Kitchen ELIQUIS 5 MG TABS tablet TAKE 1 TABLET TWICE A DAY 60 tablet 0   No facility-administered medications prior to visit.      Allergies:   Patient has no known allergies.   Social History   Social History  . Marital status: Married    Spouse name: N/A  . Number of children: N/A  . Years of education: N/A   Social History Main Topics  . Smoking status: Former Smoker    Packs/day: 1.50    Years: 35.00    Types: Cigarettes    Quit date: 01/30/1979  . Smokeless tobacco: Never Used  .  Alcohol use Yes     Comment: "5 days per week I drink 1 glass of wine"  . Drug use: No  . Sexual activity: Not Currently   Other Topics Concern  . None   Social History Narrative  . None     Family History:  The patient's family history includes Cancer in his father and mother.   ROS:   Please see the history of present illness.    Back pain, decreased vision due to macular degeneration. Balance and difficulty with mobility.  All other systems reviewed and are negative.   PHYSICAL EXAM:   VS:  BP (!) 170/72 (BP Location: Left Arm)   Pulse (!) 57   Ht 6\' 2"  (1.88 m)   Wt 178 lb 12.8 oz (81.1 kg)   BMI 22.96 kg/m    GEN: Well nourished, well developed, in no acute distress  HEENT: normal  Neck: no JVD, carotid bruits, or masses Cardiac:  IIRR; no murmurs, rubs, or gallops; trace to 1+ bilateral lower extremity edema is noted.  Respiratory:  clear to auscultation bilaterally, normal work of breathing GI: soft, nontender, nondistended, + BS MS: no deformity or atrophy  Skin: warm and dry, no rash Neuro:  Alert and Oriented x 3, Strength and sensation are intact Psych: euthymic mood, full affect  Wt Readings from Last 3 Encounters:  08/18/16 178 lb 12.8 oz (81.1 kg)  09/25/14 186 lb 6.4 oz (84.6 kg)  09/25/13 189 lb (85.7 kg)      Studies/Labs Reviewed:   EKG:  EKG  Atrial fibrillation with controlled ventricular response/slow ventricular response at 57 bpm. No change in appearance compared to the previous years tracing there is left axis deviation.  Recent Labs: No results found for requested labs within last 8760 hours.   Lipid Panel No results found for: CHOL, TRIG, HDL, CHOLHDL, VLDL, LDLCALC, LDLDIRECT  Additional studies/ records that were reviewed today include:  No recent laboratory data. Dr. Deforest Hoyles checks labs. Last creatinine and hemoglobin were 0.94 and 13.918 months ago.    ASSESSMENT:    1. Essential hypertension   2. Chronic atrial fibrillation (HCC)   3. Other hyperlipidemia   4. Long term current use of anticoagulant therapy      PLAN:  In order of problems listed above:  1. Repeat blood pressure today 150/70 mmHg. At Justin Potter age I believe this is a reasonable blood pressure. Continue to monitor but no change in therapy at this time. 2. Controlled rate. Actually rate is somewhat slow but consistent with previous evaluation one year ago. Cautioned to alert me to any near-syncope syncope episodes. Contemplated performed a 24-hour Holter to live for chronotropic incompetence but decided against this. 3. Followed by primary care, Dr. Deforest Hoyles. Target is 100. 4. No overt bleeding episodes. No head trauma. Plan to check hemoglobin and creatinine today to make sure that our current dose of quit  as is appropriate.    Medication Adjustments/Labs and Tests Ordered: Current medicines are reviewed at length with the patient today.  Concerns regarding medicines are outlined above.  Medication changes, Labs and Tests ordered today are listed in the Patient Instructions below. There are no Patient Instructions on file for this visit.   Signed, [name], MD  08/18/2016 3:58 PM    [facility] [address], [place], [place]  [address] Phone: [phone]; Fax: [phone]

## 2016-08-18 NOTE — Patient Instructions (Signed)
Medication Instructions:  None  Labwork: BMET and CBC today  Testing/Procedures: None  Follow-Up: Your physician wants you to follow-up in:  1 year with Dr. Tamala Julian.  You will receive a reminder letter in the mail two months in advance. If you don't receive a letter, please call our office to schedule the follow-up appointment.   Any Other Special Instructions Will Be Listed Below (If Applicable).     If you need a refill on your cardiac medications before your next appointment, please call your pharmacy.

## 2016-08-19 LAB — BASIC METABOLIC PANEL
BUN/Creatinine Ratio: 25 — ABNORMAL HIGH (ref 10–24)
BUN: 19 mg/dL (ref 8–27)
CO2: 29 mmol/L (ref 18–29)
CREATININE: 0.77 mg/dL (ref 0.76–1.27)
Calcium: 9.5 mg/dL (ref 8.6–10.2)
Chloride: 100 mmol/L (ref 96–106)
GFR calc Af Amer: 94 mL/min/{1.73_m2} (ref >59)
GFR calc non Af Amer: 81 mL/min/{1.73_m2} (ref >59)
Glucose: 95 mg/dL (ref 65–99)
Potassium: 4.5 mmol/L (ref 3.5–5.2)
Sodium: 142 mmol/L (ref 134–144)

## 2016-08-19 LAB — CBC
HEMATOCRIT: 40.8 % (ref 37.5–51.0)
HEMOGLOBIN: 13.3 g/dL (ref 13.0–17.7)
MCH: 30.6 pg (ref 26.6–33.0)
MCHC: 32.6 g/dL (ref 31.5–35.7)
MCV: 94 fL (ref 79–97)
Platelets: 259 10*3/uL (ref 150–379)
RBC: 4.34 x10E6/uL (ref 4.14–5.80)
RDW: 13.7 % (ref 12.3–15.4)
WBC: 8.2 10*3/uL (ref 3.4–10.8)

## 2016-10-18 DIAGNOSIS — Z85828 Personal history of other malignant neoplasm of skin: Secondary | ICD-10-CM | POA: Diagnosis not present

## 2016-10-18 DIAGNOSIS — L570 Actinic keratosis: Secondary | ICD-10-CM | POA: Diagnosis not present

## 2016-10-18 DIAGNOSIS — D485 Neoplasm of uncertain behavior of skin: Secondary | ICD-10-CM | POA: Diagnosis not present

## 2016-10-18 DIAGNOSIS — L821 Other seborrheic keratosis: Secondary | ICD-10-CM | POA: Diagnosis not present

## 2016-10-18 DIAGNOSIS — Z85820 Personal history of malignant melanoma of skin: Secondary | ICD-10-CM | POA: Diagnosis not present

## 2016-10-18 DIAGNOSIS — D0472 Carcinoma in situ of skin of left lower limb, including hip: Secondary | ICD-10-CM | POA: Diagnosis not present

## 2016-10-18 DIAGNOSIS — D1801 Hemangioma of skin and subcutaneous tissue: Secondary | ICD-10-CM | POA: Diagnosis not present

## 2016-12-25 DIAGNOSIS — H52203 Unspecified astigmatism, bilateral: Secondary | ICD-10-CM | POA: Diagnosis not present

## 2016-12-25 DIAGNOSIS — H353113 Nonexudative age-related macular degeneration, right eye, advanced atrophic without subfoveal involvement: Secondary | ICD-10-CM | POA: Diagnosis not present

## 2016-12-25 DIAGNOSIS — H548 Legal blindness, as defined in USA: Secondary | ICD-10-CM | POA: Diagnosis not present

## 2016-12-25 DIAGNOSIS — Z961 Presence of intraocular lens: Secondary | ICD-10-CM | POA: Diagnosis not present

## 2017-01-01 DIAGNOSIS — R7309 Other abnormal glucose: Secondary | ICD-10-CM | POA: Diagnosis not present

## 2017-01-01 DIAGNOSIS — C61 Malignant neoplasm of prostate: Secondary | ICD-10-CM | POA: Diagnosis not present

## 2017-01-01 DIAGNOSIS — R269 Unspecified abnormalities of gait and mobility: Secondary | ICD-10-CM | POA: Diagnosis not present

## 2017-01-01 DIAGNOSIS — I779 Disorder of arteries and arterioles, unspecified: Secondary | ICD-10-CM | POA: Diagnosis not present

## 2017-01-01 DIAGNOSIS — M109 Gout, unspecified: Secondary | ICD-10-CM | POA: Diagnosis not present

## 2017-01-01 DIAGNOSIS — M1990 Unspecified osteoarthritis, unspecified site: Secondary | ICD-10-CM | POA: Diagnosis not present

## 2017-01-01 DIAGNOSIS — G459 Transient cerebral ischemic attack, unspecified: Secondary | ICD-10-CM | POA: Diagnosis not present

## 2017-01-01 DIAGNOSIS — Z1389 Encounter for screening for other disorder: Secondary | ICD-10-CM | POA: Diagnosis not present

## 2017-01-01 DIAGNOSIS — E7800 Pure hypercholesterolemia, unspecified: Secondary | ICD-10-CM | POA: Diagnosis not present

## 2017-01-01 DIAGNOSIS — I480 Paroxysmal atrial fibrillation: Secondary | ICD-10-CM | POA: Diagnosis not present

## 2017-01-01 DIAGNOSIS — I10 Essential (primary) hypertension: Secondary | ICD-10-CM | POA: Diagnosis not present

## 2017-01-01 DIAGNOSIS — Z0000 Encounter for general adult medical examination without abnormal findings: Secondary | ICD-10-CM | POA: Diagnosis not present

## 2017-01-10 DIAGNOSIS — R351 Nocturia: Secondary | ICD-10-CM | POA: Diagnosis not present

## 2017-01-10 DIAGNOSIS — N359 Urethral stricture, unspecified: Secondary | ICD-10-CM | POA: Diagnosis not present

## 2017-01-10 DIAGNOSIS — Z8546 Personal history of malignant neoplasm of prostate: Secondary | ICD-10-CM | POA: Diagnosis not present

## 2017-01-15 DIAGNOSIS — H353222 Exudative age-related macular degeneration, left eye, with inactive choroidal neovascularization: Secondary | ICD-10-CM | POA: Diagnosis not present

## 2017-01-15 DIAGNOSIS — H353124 Nonexudative age-related macular degeneration, left eye, advanced atrophic with subfoveal involvement: Secondary | ICD-10-CM | POA: Diagnosis not present

## 2017-01-15 DIAGNOSIS — H353114 Nonexudative age-related macular degeneration, right eye, advanced atrophic with subfoveal involvement: Secondary | ICD-10-CM | POA: Diagnosis not present

## 2017-01-15 DIAGNOSIS — H353212 Exudative age-related macular degeneration, right eye, with inactive choroidal neovascularization: Secondary | ICD-10-CM | POA: Diagnosis not present

## 2017-04-16 DIAGNOSIS — L723 Sebaceous cyst: Secondary | ICD-10-CM | POA: Diagnosis not present

## 2017-04-16 DIAGNOSIS — D1801 Hemangioma of skin and subcutaneous tissue: Secondary | ICD-10-CM | POA: Diagnosis not present

## 2017-04-16 DIAGNOSIS — L570 Actinic keratosis: Secondary | ICD-10-CM | POA: Diagnosis not present

## 2017-04-16 DIAGNOSIS — D485 Neoplasm of uncertain behavior of skin: Secondary | ICD-10-CM | POA: Diagnosis not present

## 2017-04-16 DIAGNOSIS — Z85820 Personal history of malignant melanoma of skin: Secondary | ICD-10-CM | POA: Diagnosis not present

## 2017-04-16 DIAGNOSIS — C44319 Basal cell carcinoma of skin of other parts of face: Secondary | ICD-10-CM | POA: Diagnosis not present

## 2017-04-16 DIAGNOSIS — D225 Melanocytic nevi of trunk: Secondary | ICD-10-CM | POA: Diagnosis not present

## 2017-04-16 DIAGNOSIS — L821 Other seborrheic keratosis: Secondary | ICD-10-CM | POA: Diagnosis not present

## 2017-04-16 DIAGNOSIS — Z85828 Personal history of other malignant neoplasm of skin: Secondary | ICD-10-CM | POA: Diagnosis not present

## 2017-06-11 DIAGNOSIS — Z23 Encounter for immunization: Secondary | ICD-10-CM | POA: Diagnosis not present

## 2017-07-09 DIAGNOSIS — R7309 Other abnormal glucose: Secondary | ICD-10-CM | POA: Diagnosis not present

## 2017-07-09 DIAGNOSIS — C61 Malignant neoplasm of prostate: Secondary | ICD-10-CM | POA: Diagnosis not present

## 2017-07-09 DIAGNOSIS — M109 Gout, unspecified: Secondary | ICD-10-CM | POA: Diagnosis not present

## 2017-07-09 DIAGNOSIS — I10 Essential (primary) hypertension: Secondary | ICD-10-CM | POA: Diagnosis not present

## 2017-07-09 DIAGNOSIS — G459 Transient cerebral ischemic attack, unspecified: Secondary | ICD-10-CM | POA: Diagnosis not present

## 2017-07-09 DIAGNOSIS — R269 Unspecified abnormalities of gait and mobility: Secondary | ICD-10-CM | POA: Diagnosis not present

## 2017-07-09 DIAGNOSIS — M1990 Unspecified osteoarthritis, unspecified site: Secondary | ICD-10-CM | POA: Diagnosis not present

## 2017-07-09 DIAGNOSIS — I480 Paroxysmal atrial fibrillation: Secondary | ICD-10-CM | POA: Diagnosis not present

## 2017-07-09 DIAGNOSIS — E7800 Pure hypercholesterolemia, unspecified: Secondary | ICD-10-CM | POA: Diagnosis not present

## 2017-08-29 DIAGNOSIS — H353212 Exudative age-related macular degeneration, right eye, with inactive choroidal neovascularization: Secondary | ICD-10-CM | POA: Diagnosis not present

## 2017-08-29 DIAGNOSIS — H353124 Nonexudative age-related macular degeneration, left eye, advanced atrophic with subfoveal involvement: Secondary | ICD-10-CM | POA: Diagnosis not present

## 2017-08-29 DIAGNOSIS — H353114 Nonexudative age-related macular degeneration, right eye, advanced atrophic with subfoveal involvement: Secondary | ICD-10-CM | POA: Diagnosis not present

## 2017-08-29 DIAGNOSIS — H353222 Exudative age-related macular degeneration, left eye, with inactive choroidal neovascularization: Secondary | ICD-10-CM | POA: Diagnosis not present

## 2017-09-20 ENCOUNTER — Ambulatory Visit: Payer: Federal, State, Local not specified - PPO | Admitting: Interventional Cardiology

## 2017-09-25 NOTE — Progress Notes (Signed)
Cardiology Office Note    Date:  09/26/2017   [name], DOB 10-Oct-1925, MRN [id number]  PCP:  [name], MD  Cardiologist: [name], MD   Chief Complaint  Patient presents with  . Atrial Fibrillation    History of Present Illness:  Justin Potter is a 84 y.o. male for follow-up of atrial fibrillation and anticoagulation therapy.    [name] is doing well.  He has not had syncope and denies orthopnea, PND, and increase in chronic lower extremity edema.  He has not had angina.  He is still ambulatory.  He has some mild orthostatic dizziness.  He is able to lie flat in bed without dyspnea.  Feels clinically stable over the past 6-12 months.   Past Medical History:  Diagnosis Date  . Anxiety   . Arthritis    "in the fingers" per pt  . Atrial fibrillation ([place])   . Bladder neck contracture   . Dysrhythmia    A-FIB / HX BRADYCARDIA - FOLLOWED BY DR. [place]  . Gross hematuria   . History of gout   . History of prostate cancer    S/P RADIOACTIVE SEED IMPLANTS  . History of squamous cell carcinoma of skin    EXCISION LOWER LIP AND RIGHT EAR  . History of TIA (transient ischemic attack)    probable tia no residual  09-09-2012  . Hyperlipidemia   . Hypertension   . Macular degeneration    both eyes  . Urethral stricture   . Urinary retention     Past Surgical History:  Procedure Laterality Date  . APPENDECTOMY    . CATARACT EXTRACTION W/ INTRAOCULAR LENS  IMPLANT, BILATERAL    . CHOLECYSTECTOMY  1977  . CYSTOSCOPY WITH URETHRAL DILATATION N/A 02/12/2013   Procedure: CYSTOSCOPY WITH BALLOON DILATATION OF URETHRAL STRICTURE AND BLADDER FULGERATION;  Surgeon: Bernestine Amass, MD;  Location: [facility];  Service: Urology;  Laterality: N/A;  . LAPAROSCOPIC APPENDECTOMY N/A 08/22/2013   Procedure: APPENDECTOMY LAPAROSCOPIC;  Surgeon: Pedro Earls, MD;  Location: [facility];  Service: General;  Laterality: N/A;  .  [place]  . TONSILLECTOMY  as child  . TOTAL KNEE ARTHROPLASTY Right 11-23-2004    Current Medications: Outpatient Medications Prior to Visit  Medication Sig Dispense Refill  . amLODipine (NORVASC) 5 MG tablet Take 5 mg by mouth every evening.     Marland Kitchen atorvastatin (LIPITOR) 10 MG tablet Take 10 mg by mouth every evening.     . clonazePAM (KLONOPIN) 0.5 MG tablet Take 0.5 mg by mouth 2 (two) times daily as needed for anxiety.     . docusate sodium (COLACE) 100 MG capsule Take 100 mg by mouth 2 (two) times daily. Reported on 10/20/2015    . ELIQUIS 5 MG TABS tablet Take 1 tablet (5 mg total) by mouth 2 (two) times daily. 180 tablet 3  . Multiple Vitamins-Minerals (OCUVITE PRESERVISION PO) Take 1 tablet by mouth every morning.     . Multiple Vitamins-Minerals (PRESERVISION AREDS 2 PO) Take 1 capsule by mouth 2 (two) times daily.     No facility-administered medications prior to visit.      Allergies:   Patient has no known allergies.   Social History   Socioeconomic History  . Marital status: Married    Spouse name: None  . Number of children: None  . Years of education: None  .  Highest education level: None  Social Needs  . Financial resource strain: None  . Food insecurity - worry: None  . Food insecurity - inability: None  . Transportation needs - medical: None  . Transportation needs - non-medical: None  Occupational History  . None  Tobacco Use  . Smoking status: Former Smoker    Packs/day: 1.50    Years: 35.00    Pack years: 52.50    Types: Cigarettes    Last attempt to quit: 01/30/1979    Years since quitting: 38.6  . Smokeless tobacco: Never Used  Substance and Sexual Activity  . Alcohol use: Yes    Comment: "5 days per week I drink 1 glass of wine"  . Drug use: No  . Sexual activity: Not Currently  Other Topics Concern  . None  Social History Narrative  . None     Family History:  The patient's family history includes Cancer in  his father and mother.   ROS:   Please see the history of present illness.    Frequent urination, difficulty with balance, anxiety, swelling in ankles, lightheaded at times when he stands up. All other systems reviewed and are negative.   PHYSICAL EXAM:   VS:  BP 116/64   Pulse (!) 49   Ht 6\' 2"  (1.88 m)   Wt 175 lb 6.4 oz (79.6 kg)   BMI 22.52 kg/m    GEN: Well nourished, well developed, in no acute distress  HEENT: normal  Neck: no JVD, carotid bruits, or masses Cardiac: Irregularly irregular slow rate.  There are no murmurs, rubs, or gallops, no edema. Respiratory:  clear to auscultation bilaterally, normal work of breathing GI: soft, nontender, nondistended, + BS MS: no deformity or atrophy  Skin: warm and dry, no rash Neuro:  Alert and Oriented x 3, Strength and sensation are intact Psych: euthymic mood, full affect  Wt Readings from Last 3 Encounters:  09/26/17 175 lb 6.4 oz (79.6 kg)  08/18/16 178 lb 12.8 oz (81.1 kg)  09/25/14 186 lb 6.4 oz (84.6 kg)      Studies/Labs Reviewed:   EKG:  EKG atrial fibrillation with slow ventricular response, poor R wave progression, left axis deviation, no significant change when compared to prior except heart rate is slightly slower increasing to 49 from a prior heart rate of 57 bpm in 2018.  Recent Labs: No results found for requested labs within last 8760 hours.   Lipid Panel No results found for: CHOL, TRIG, HDL, CHOLHDL, VLDL, LDLCALC, LDLDIRECT  Additional studies/ records that were reviewed today include:  No new functional or imaging data.    ASSESSMENT:    1. Persistent atrial fibrillation ([place])   2. Long term current use of anticoagulant therapy   3. Essential hypertension   4. Mixed hyperlipidemia      PLAN:  In order of problems listed above:  1. Now developing slow ventricular response.  Inherent AV node disease is obvious.  Be cautioned concerning syncope/near syncope as warning signs of need for  pacemaker therapy.  He is basically asymptomatic now.  No further investigation.  Follow closely. 2. Continue anticoagulation therapy with apixaban.  Most recent laboratory data revealed a creatinine of 0.84 weight and other parameters dictate that he should be continued on standard dose. 3. Adequate blood pressure control. 4. Not addressed  Clinical follow-up in 1 year.  Needs twice yearly hemoglobin and creatinine.  Notify us if falls or episodes of dizziness/near syncope.    Medication  Adjustments/Labs and Tests Ordered: Current medicines are reviewed at length with the patient today.  Concerns regarding medicines are outlined above.  Medication changes, Labs and Tests ordered today are listed in the Patient Instructions below. Patient Instructions  Medication Instructions:  Your physician recommends that you continue on your current medications as directed. Please refer to the Current Medication list given to you today.  Labwork: CBC and BMET  Testing/Procedures: None  Follow-Up: Your physician wants you to follow-up in: 1 year with Dr. Tamala Julian. You will receive a reminder letter in the mail two months in advance. If you don't receive a letter, please call our office to schedule the follow-up appointment.   Any Other Special Instructions Will Be Listed Below (If Applicable).     If you need a refill on your cardiac medications before your next appointment, please call your pharmacy.      Signed, [name], MD  09/26/2017 5:50 PM    [facility] [place], [place], [place]  [address] Phone: [phone]; Fax: [phone]

## 2017-09-26 ENCOUNTER — Encounter (INDEPENDENT_AMBULATORY_CARE_PROVIDER_SITE_OTHER): Payer: Self-pay

## 2017-09-26 ENCOUNTER — Ambulatory Visit (INDEPENDENT_AMBULATORY_CARE_PROVIDER_SITE_OTHER): Payer: Medicare Other | Admitting: Interventional Cardiology

## 2017-09-26 ENCOUNTER — Encounter: Payer: Self-pay | Admitting: Interventional Cardiology

## 2017-09-26 VITALS — BP 116/64 | HR 49 | Ht 74.0 in | Wt 175.4 lb

## 2017-09-26 DIAGNOSIS — E782 Mixed hyperlipidemia: Secondary | ICD-10-CM | POA: Diagnosis not present

## 2017-09-26 DIAGNOSIS — I481 Persistent atrial fibrillation: Secondary | ICD-10-CM | POA: Diagnosis not present

## 2017-09-26 DIAGNOSIS — I10 Essential (primary) hypertension: Secondary | ICD-10-CM

## 2017-09-26 DIAGNOSIS — Z7901 Long term (current) use of anticoagulants: Secondary | ICD-10-CM | POA: Diagnosis not present

## 2017-09-26 DIAGNOSIS — I4819 Other persistent atrial fibrillation: Secondary | ICD-10-CM

## 2017-09-26 NOTE — Patient Instructions (Signed)
Medication Instructions:  Your physician recommends that you continue on your current medications as directed. Please refer to the Current Medication list given to you today.  Labwork: CBC and BMET  Testing/Procedures: None  Follow-Up: Your physician wants you to follow-up in: 1 year with Dr. Tamala Julian. You will receive a reminder letter in the mail two months in advance. If you don't receive a letter, please call our office to schedule the follow-up appointment.   Any Other Special Instructions Will Be Listed Below (If Applicable).     If you need a refill on your cardiac medications before your next appointment, please call your pharmacy.

## 2017-09-27 ENCOUNTER — Other Ambulatory Visit: Payer: Self-pay | Admitting: *Deleted

## 2017-09-27 LAB — CBC
HEMATOCRIT: 38.3 % (ref 37.5–51.0)
HEMOGLOBIN: 12.7 g/dL — AB (ref 13.0–17.7)
MCH: 30 pg (ref 26.6–33.0)
MCHC: 33.2 g/dL (ref 31.5–35.7)
MCV: 91 fL (ref 79–97)
Platelets: 195 10*3/uL (ref 150–379)
RBC: 4.23 x10E6/uL (ref 4.14–5.80)
RDW: 14.3 % (ref 12.3–15.4)
WBC: 7.8 10*3/uL (ref 3.4–10.8)

## 2017-09-27 LAB — BASIC METABOLIC PANEL
BUN / CREAT RATIO: 18 (ref 10–24)
BUN: 17 mg/dL (ref 8–27)
CO2: 26 mmol/L (ref 20–29)
CREATININE: 0.93 mg/dL (ref 0.76–1.27)
Calcium: 8.9 mg/dL (ref 8.6–10.2)
Chloride: 104 mmol/L (ref 96–106)
GFR calc Af Amer: 84 mL/min/{1.73_m2} (ref >59)
GFR calc non Af Amer: 73 mL/min/{1.73_m2} (ref >59)
GLUCOSE: 75 mg/dL (ref 65–99)
Potassium: 4.2 mmol/L (ref 3.5–5.2)
Sodium: 145 mmol/L — ABNORMAL HIGH (ref 134–144)

## 2017-09-27 MED ORDER — ELIQUIS 5 MG PO TABS: 5.0000 mg | ORAL_TABLET | Freq: Two times a day (BID) | ORAL | 3 refills | Status: DC

## 2017-10-15 DIAGNOSIS — L720 Epidermal cyst: Secondary | ICD-10-CM | POA: Diagnosis not present

## 2017-10-15 DIAGNOSIS — L821 Other seborrheic keratosis: Secondary | ICD-10-CM | POA: Diagnosis not present

## 2017-10-15 DIAGNOSIS — L400 Psoriasis vulgaris: Secondary | ICD-10-CM | POA: Diagnosis not present

## 2017-10-15 DIAGNOSIS — Z85828 Personal history of other malignant neoplasm of skin: Secondary | ICD-10-CM | POA: Diagnosis not present

## 2017-10-15 DIAGNOSIS — L570 Actinic keratosis: Secondary | ICD-10-CM | POA: Diagnosis not present

## 2017-10-15 DIAGNOSIS — D2262 Melanocytic nevi of left upper limb, including shoulder: Secondary | ICD-10-CM | POA: Diagnosis not present

## 2017-10-15 DIAGNOSIS — D1801 Hemangioma of skin and subcutaneous tissue: Secondary | ICD-10-CM | POA: Diagnosis not present

## 2017-10-15 DIAGNOSIS — Z85820 Personal history of malignant melanoma of skin: Secondary | ICD-10-CM | POA: Diagnosis not present

## 2017-11-30 ENCOUNTER — Telehealth: Payer: Self-pay | Admitting: Interventional Cardiology

## 2017-11-30 NOTE — Telephone Encounter (Signed)
New Message   Justin Potter with [facility] Physician Dr. Lysle Rubens PCP office is calling on behalf of patient. She states that the patient mention that he was due for some labs from Dr. Tamala Julian. He is to have a physical in June and they are willing to do the labs while he is there if Dr. Tamala Julian wishes to have any. Please call

## 2017-11-30 NOTE — Telephone Encounter (Signed)
Left detailed message on Justin Potter's VM that we have recent CBC and BMET.  Advised pt is on Atorvastatin so if they check liver and lipid we would like those results.  Advised to call back if any questions.

## 2017-12-26 DIAGNOSIS — H353114 Nonexudative age-related macular degeneration, right eye, advanced atrophic with subfoveal involvement: Secondary | ICD-10-CM | POA: Diagnosis not present

## 2017-12-26 DIAGNOSIS — H353222 Exudative age-related macular degeneration, left eye, with inactive choroidal neovascularization: Secondary | ICD-10-CM | POA: Diagnosis not present

## 2017-12-26 DIAGNOSIS — H5211 Myopia, right eye: Secondary | ICD-10-CM | POA: Diagnosis not present

## 2017-12-26 DIAGNOSIS — Z961 Presence of intraocular lens: Secondary | ICD-10-CM | POA: Diagnosis not present

## 2018-01-07 ENCOUNTER — Encounter: Payer: Self-pay | Admitting: Interventional Cardiology

## 2018-01-07 DIAGNOSIS — Z23 Encounter for immunization: Secondary | ICD-10-CM | POA: Diagnosis not present

## 2018-01-07 DIAGNOSIS — G459 Transient cerebral ischemic attack, unspecified: Secondary | ICD-10-CM | POA: Diagnosis not present

## 2018-01-07 DIAGNOSIS — I480 Paroxysmal atrial fibrillation: Secondary | ICD-10-CM | POA: Diagnosis not present

## 2018-01-07 DIAGNOSIS — Z0000 Encounter for general adult medical examination without abnormal findings: Secondary | ICD-10-CM | POA: Diagnosis not present

## 2018-01-07 DIAGNOSIS — I10 Essential (primary) hypertension: Secondary | ICD-10-CM | POA: Diagnosis not present

## 2018-01-07 DIAGNOSIS — Z1389 Encounter for screening for other disorder: Secondary | ICD-10-CM | POA: Diagnosis not present

## 2018-01-07 DIAGNOSIS — C61 Malignant neoplasm of prostate: Secondary | ICD-10-CM | POA: Diagnosis not present

## 2018-01-07 DIAGNOSIS — H548 Legal blindness, as defined in USA: Secondary | ICD-10-CM | POA: Diagnosis not present

## 2018-01-07 DIAGNOSIS — R269 Unspecified abnormalities of gait and mobility: Secondary | ICD-10-CM | POA: Diagnosis not present

## 2018-01-07 DIAGNOSIS — E7800 Pure hypercholesterolemia, unspecified: Secondary | ICD-10-CM | POA: Diagnosis not present

## 2018-01-07 DIAGNOSIS — M1990 Unspecified osteoarthritis, unspecified site: Secondary | ICD-10-CM | POA: Diagnosis not present

## 2018-01-07 DIAGNOSIS — M109 Gout, unspecified: Secondary | ICD-10-CM | POA: Diagnosis not present

## 2018-04-17 DIAGNOSIS — D485 Neoplasm of uncertain behavior of skin: Secondary | ICD-10-CM | POA: Diagnosis not present

## 2018-04-17 DIAGNOSIS — D1801 Hemangioma of skin and subcutaneous tissue: Secondary | ICD-10-CM | POA: Diagnosis not present

## 2018-04-17 DIAGNOSIS — L820 Inflamed seborrheic keratosis: Secondary | ICD-10-CM | POA: Diagnosis not present

## 2018-04-17 DIAGNOSIS — D045 Carcinoma in situ of skin of trunk: Secondary | ICD-10-CM | POA: Diagnosis not present

## 2018-04-17 DIAGNOSIS — L570 Actinic keratosis: Secondary | ICD-10-CM | POA: Diagnosis not present

## 2018-04-17 DIAGNOSIS — L821 Other seborrheic keratosis: Secondary | ICD-10-CM | POA: Diagnosis not present

## 2018-04-17 DIAGNOSIS — Z85828 Personal history of other malignant neoplasm of skin: Secondary | ICD-10-CM | POA: Diagnosis not present

## 2018-06-03 DIAGNOSIS — H353114 Nonexudative age-related macular degeneration, right eye, advanced atrophic with subfoveal involvement: Secondary | ICD-10-CM | POA: Diagnosis not present

## 2018-06-03 DIAGNOSIS — H353212 Exudative age-related macular degeneration, right eye, with inactive choroidal neovascularization: Secondary | ICD-10-CM | POA: Diagnosis not present

## 2018-06-03 DIAGNOSIS — H353222 Exudative age-related macular degeneration, left eye, with inactive choroidal neovascularization: Secondary | ICD-10-CM | POA: Diagnosis not present

## 2018-06-03 DIAGNOSIS — H353124 Nonexudative age-related macular degeneration, left eye, advanced atrophic with subfoveal involvement: Secondary | ICD-10-CM | POA: Diagnosis not present

## 2018-07-10 ENCOUNTER — Other Ambulatory Visit: Payer: Self-pay | Admitting: Internal Medicine

## 2018-07-10 DIAGNOSIS — H548 Legal blindness, as defined in USA: Secondary | ICD-10-CM | POA: Diagnosis not present

## 2018-07-10 DIAGNOSIS — R1310 Dysphagia, unspecified: Secondary | ICD-10-CM | POA: Diagnosis not present

## 2018-07-10 DIAGNOSIS — E7800 Pure hypercholesterolemia, unspecified: Secondary | ICD-10-CM | POA: Diagnosis not present

## 2018-07-10 DIAGNOSIS — I10 Essential (primary) hypertension: Secondary | ICD-10-CM | POA: Diagnosis not present

## 2018-07-10 DIAGNOSIS — C61 Malignant neoplasm of prostate: Secondary | ICD-10-CM | POA: Diagnosis not present

## 2018-07-10 DIAGNOSIS — I480 Paroxysmal atrial fibrillation: Secondary | ICD-10-CM | POA: Diagnosis not present

## 2018-07-10 DIAGNOSIS — G459 Transient cerebral ischemic attack, unspecified: Secondary | ICD-10-CM | POA: Diagnosis not present

## 2018-07-10 DIAGNOSIS — R269 Unspecified abnormalities of gait and mobility: Secondary | ICD-10-CM | POA: Diagnosis not present

## 2018-07-15 ENCOUNTER — Other Ambulatory Visit: Payer: Self-pay | Admitting: Internal Medicine

## 2018-07-15 ENCOUNTER — Ambulatory Visit
Admission: RE | Admit: 2018-07-15 | Discharge: 2018-07-15 | Disposition: A | Discharge status: Home / Self Care | Payer: Medicare Other | Source: Ambulatory Visit | Attending: Internal Medicine | Admitting: Internal Medicine

## 2018-07-15 DIAGNOSIS — R1310 Dysphagia, unspecified: Secondary | ICD-10-CM

## 2018-07-15 DIAGNOSIS — T18120A Food in esophagus causing compression of trachea, initial encounter: Secondary | ICD-10-CM | POA: Diagnosis not present

## 2018-08-01 DIAGNOSIS — R1310 Dysphagia, unspecified: Secondary | ICD-10-CM | POA: Diagnosis not present

## 2018-08-01 DIAGNOSIS — K222 Esophageal obstruction: Secondary | ICD-10-CM | POA: Diagnosis not present

## 2018-08-06 ENCOUNTER — Telehealth: Payer: Self-pay | Admitting: *Deleted

## 2018-08-06 NOTE — Telephone Encounter (Signed)
Pt scheduled to see Truitt Merle, NP, 08/13/18 for surgical clearance. Pt was offered 08/07/18, but due to transportation, pt had to decline.. Pt is aware that his procedure will need to be postponed until after his appt date. Pt verbalized understanding. I will route back to the requesting surgeons office to make them aware.

## 2018-08-06 NOTE — Telephone Encounter (Signed)
   Primary Cardiologist:Henry Nicholes Stairs III, MD  Chart reviewed as part of pre-operative protocol coverage. Because of Justin Potter's past medical history and time since last visit, he/she will require a follow-up visit in order to better assess preoperative cardiovascular risk.  Pre-op covering staff: - Please schedule appointment and call patient to inform them. - Please contact requesting surgeon's office via preferred method (i.e, phone, fax) to inform them of need for appointment prior to surgery.  If applicable, this message will also be routed to pharmacy pool and/or primary cardiologist for input on holding anticoagulant/antiplatelet agent as requested below so that this information is available at time of patient's appointment.   [place], [place]  08/06/2018, 1:46 PM

## 2018-08-06 NOTE — Telephone Encounter (Signed)
Patient with diagnosis of Afib on Eliquis for anticoagulation.    Procedure: endoscopy Date of procedure: 08/09/18  CHADS2-VASc score of  5 (CHF, HTN, AGE, DM2, stroke/tia x 2, CAD, AGE, male)  CrCl 10ml/min  Per office protocol, patient can hold Eliquis for 24 hours prior to procedure.  Given history of TIA would recommend resume as soon as safe post procedure.

## 2018-08-06 NOTE — Telephone Encounter (Signed)
1st attempt to reach pt. Called pt re: surgical clearance below.  Pt needs an appt before clearance can be granted. Left a message for pt to call back.

## 2018-08-06 NOTE — Telephone Encounter (Signed)
    [facility] Pre-operative Risk Assessment    Request for surgical clearance:  1. What type of surgery is being performed? ENDOSCOPY   2. When is this surgery scheduled? 08/09/18   3. What type of clearance is required (medical clearance vs. Pharmacy clearance to hold med vs. Both)? BOTH  4. Are there any medications that need to be held prior to surgery and how long?ELIQUIS   5. Practice name and name of physician performing surgery? [facility]   6. What is your office phone number [phone]    7.   What is your office fax number [phone]  8.   Anesthesia type (None, local, MAC, general) ? PROPOFOL   Julaine Hua 08/06/2018, 12:02 PM  _________________________________________________________________   (provider comments below)

## 2018-08-13 ENCOUNTER — Ambulatory Visit (INDEPENDENT_AMBULATORY_CARE_PROVIDER_SITE_OTHER): Payer: Medicare Other | Admitting: Nurse Practitioner

## 2018-08-13 ENCOUNTER — Encounter: Payer: Self-pay | Admitting: Nurse Practitioner

## 2018-08-13 VITALS — BP 128/64 | HR 54 | Ht 74.0 in | Wt 165.0 lb

## 2018-08-13 DIAGNOSIS — Z7901 Long term (current) use of anticoagulants: Secondary | ICD-10-CM | POA: Diagnosis not present

## 2018-08-13 DIAGNOSIS — I4819 Other persistent atrial fibrillation: Secondary | ICD-10-CM | POA: Diagnosis not present

## 2018-08-13 DIAGNOSIS — I10 Essential (primary) hypertension: Secondary | ICD-10-CM

## 2018-08-13 DIAGNOSIS — Z79899 Other long term (current) drug therapy: Secondary | ICD-10-CM | POA: Diagnosis not present

## 2018-08-13 DIAGNOSIS — E782 Mixed hyperlipidemia: Secondary | ICD-10-CM | POA: Diagnosis not present

## 2018-08-13 DIAGNOSIS — R634 Abnormal weight loss: Secondary | ICD-10-CM | POA: Diagnosis not present

## 2018-08-13 NOTE — Progress Notes (Signed)
CARDIOLOGY OFFICE NOTE  Date:  08/13/2018    Justin Potter Date of Birth: 03-28-1926 Medical Record #[id number]  PCP:  [name], MD  Cardiologist:  Tamala Julian    Chief Complaint  Patient presents with  . Pre-op Exam    Seen for Dr. Tamala Julian    History of Present Illness: Justin Potter is a 85 y.o. male who presents today for a pre op clearance visit. Seen for Dr. Tamala Julian.  He has a history of persistent AF and is on chronic anticoagulation. He has had noted prior TIA.   Last seen here in March by Dr. Tamala Julian - was doing well. Did note that he was having slowing of his conduction - asymptomatic.   Comes in today. Here with his daughter in law Maudie Mercury. His son is a retired Pension scheme manager. Needing endoscopy with Dr. Watt Climes. He is having trouble swallowing - food and pills. Has had weight loss as well - down 10 pounds since last visit here. No chest pain. Breathing is ok. He resides at [facility] with his wife - he does walk the halls there - does laundry - cares for her, etc. He typically does not go up steps. He thinks he could walk a block. No passing out. Will have some positional dizziness. No palpitations.   Past Medical History:  Diagnosis Date  . Anxiety   . Arthritis    "in the fingers" per pt  . Atrial fibrillation ([place])   . Bladder neck contracture   . Dysrhythmia    A-FIB / HX BRADYCARDIA - FOLLOWED BY DR. [place]  . Gross hematuria   . History of gout   . History of prostate cancer    S/P RADIOACTIVE SEED IMPLANTS  . History of squamous cell carcinoma of skin    EXCISION LOWER LIP AND RIGHT EAR  . History of TIA (transient ischemic attack)    probable tia no residual  09-09-2012  . Hyperlipidemia   . Hypertension   . Macular degeneration    both eyes  . Urethral stricture   . Urinary retention     Past Surgical History:  Procedure Laterality Date  . APPENDECTOMY    . CATARACT EXTRACTION W/ INTRAOCULAR LENS   IMPLANT, BILATERAL    . CHOLECYSTECTOMY  1977  . CYSTOSCOPY WITH URETHRAL DILATATION N/A 02/12/2013   Procedure: CYSTOSCOPY WITH BALLOON DILATATION OF URETHRAL STRICTURE AND BLADDER FULGERATION;  Surgeon: Bernestine Amass, MD;  Location: [facility];  Service: Urology;  Laterality: N/A;  . LAPAROSCOPIC APPENDECTOMY N/A 08/22/2013   Procedure: APPENDECTOMY LAPAROSCOPIC;  Surgeon: Pedro Earls, MD;  Location: [facility];  Service: General;  Laterality: N/A;  . [place]  . TONSILLECTOMY  as child  . TOTAL KNEE ARTHROPLASTY Right 11-23-2004     Medications: Current Meds  Medication Sig  . amLODipine (NORVASC) 5 MG tablet Take 5 mg by mouth every evening.   Marland Kitchen atorvastatin (LIPITOR) 10 MG tablet Take 10 mg by mouth every evening.   . clonazePAM (KLONOPIN) 0.5 MG tablet Take 0.5 mg by mouth 2 (two) times daily as needed for anxiety.   . docusate sodium (COLACE) 100 MG capsule Take 100 mg by mouth 2 (two) times daily. Reported on 10/20/2015  . ELIQUIS 5 MG TABS tablet Take 1 tablet (5 mg total) by mouth 2 (two) times daily.  . Multiple Vitamins-Minerals (OCUVITE PRESERVISION PO) Take 1 tablet by mouth  every morning.   . Multiple Vitamins-Minerals (PRESERVISION AREDS 2 PO) Take 1 capsule by mouth 2 (two) times daily.     Allergies: No Known Allergies  Social History: The patient  reports that he quit smoking about 39 years ago. His smoking use included cigarettes. He has a 52.50 pack-year smoking history. He has never used smokeless tobacco. He reports current alcohol use. He reports that he does not use drugs.   Family History: The patient's family history includes Cancer in his father and mother.   Review of Systems: Please see the history of present illness.   Otherwise, the review of systems is positive for none.   All other systems are reviewed and negative.   Physical Exam: VS:  BP 128/64 (BP Location: Left Arm, Patient Position: Sitting, Cuff Size: Normal)    Pulse (!) 54   Ht 6\' 2"  (1.88 m)   Wt 165 lb (74.8 kg)   BMI 21.18 kg/m  .  BMI Body mass index is 21.18 kg/m.  Wt Readings from Last 3 Encounters:  08/13/18 165 lb (74.8 kg)  09/26/17 175 lb 6.4 oz (79.6 kg)  08/18/16 178 lb 12.8 oz (81.1 kg)    General: Pleasant. Elderly. Alert and in no acute distress.   HEENT: Normal.  Neck: Supple, no JVD, carotid bruits, or masses noted.  Cardiac: Irregular irregular rhythm. Rate is 54 by me. No edema.  Respiratory:  Lungs are clear to auscultation bilaterally with normal work of breathing.  GI: Soft and nontender.  MS: No deformity or atrophy. Gait and ROM intact. He is using a cane.  Skin: Warm and dry. Color is normal.  Neuro:  Strength and sensation are intact and no gross focal deficits noted.  Psych: Alert, appropriate and with normal affect.   LABORATORY DATA:  EKG:  EKG is ordered today. This demonstrates AF with slow VR - HR is 54.  Lab Results  Component Value Date   WBC 7.8 09/26/2017   HGB 12.7 (L) 09/26/2017   HCT 38.3 09/26/2017   PLT 195 09/26/2017   GLUCOSE 75 09/26/2017   ALT 14 08/20/2013   AST 16 08/20/2013   NA 145 (H) 09/26/2017   K 4.2 09/26/2017   CL 104 09/26/2017   CREATININE 0.93 09/26/2017   BUN 17 09/26/2017   CO2 26 09/26/2017       BNP (last 3 results) No results for input(s): BNP in the last 8760 hours.  ProBNP (last 3 results) No results for input(s): PROBNP in the last 8760 hours.   Other Studies Reviewed Today:   Assessment/Plan:  1. Pre op clearance - he is at increased risk due to age - somewhat sedentary - no cardiac symptoms. He understands that he has increased risk which would not change with further cardiac testing. Needs to have endoscopy due to trouble swallowing. Pharmacy has recommended he hold Eliquis 24 hours prior to procedure in light of prior TIA and resume as soon as safe post procedure. Will be available as needed.    2. Persistent AF - HR is in the 50's - he is  not symptomatic - I do not think his positional dizziness is related - certainly no syncope note -  will need to continue to follow. Reminded of symptoms to be aware of.   3. Chronic anticoagulation - needs surveillance labs. No problems noted.   4. HTN - BP is fine - no changes made.   5. HLD - remains on statin. Per PCP.  6. Weight loss - lab today. To proceed with endoscopy.   7. Advanced age   Current medicines are reviewed with the patient today.  The patient does not have concerns regarding medicines other than what has been noted above.  The following changes have been made:  See above.  Labs/ tests ordered today include:    Orders Placed This Encounter  Procedures  . Basic metabolic panel  . CBC  . TSH  . EKG 12-Lead     Disposition:   FU with Dr. Tamala Julian as planned. I am happy to see back as needed   Patient is agreeable to this plan and will call if any problems develop in the interim.   SignedTruitt Merle, NP  08/13/2018 10:31 AM  [facility] [address] [place], [place]  [address] Phone: [phone] Fax: [phone]

## 2018-08-13 NOTE — Patient Instructions (Addendum)
We will be checking the following labs today - BMET, CBC and TSH   Medication Instructions:    Continue with your current medicines.    If you need a refill on your cardiac medications before your next appointment, please call your pharmacy.     Testing/Procedures To Be Arranged:  N/A  Follow-Up:   See Dr. Tamala Julian as planned in March    At [facility], you and your health needs are our priority.  As part of our continuing mission to provide you with exceptional heart care, we have created designated Provider Care Teams.  These Care Teams include your primary Cardiologist (physician) and Advanced Practice Providers (APPs -  Physician Assistants and Nurse Practitioners) who all work together to provide you with the care you need, when you need it.  Special Instructions:  I will send a note to Dr. Watt Climes - you are to hold your Eliquis 24 hours prior to your procedure and start as soon as Dr. Watt Climes says you can  Call the [place] office at [phone] if you have any questions, problems or concerns.

## 2018-08-14 LAB — CBC
Hematocrit: 40.5 % (ref 37.5–51.0)
Hemoglobin: 13.6 g/dL (ref 13.0–17.7)
MCH: 30.7 pg (ref 26.6–33.0)
MCHC: 33.6 g/dL (ref 31.5–35.7)
MCV: 91 fL (ref 79–97)
Platelets: 189 10*3/uL (ref 150–450)
RBC: 4.43 x10E6/uL (ref 4.14–5.80)
RDW: 12.3 % (ref 11.6–15.4)
WBC: 7.9 10*3/uL (ref 3.4–10.8)

## 2018-08-14 LAB — BASIC METABOLIC PANEL
BUN/Creatinine Ratio: 19 (ref 10–24)
BUN: 16 mg/dL (ref 10–36)
CO2: 26 mmol/L (ref 20–29)
Calcium: 9.6 mg/dL (ref 8.6–10.2)
Chloride: 99 mmol/L (ref 96–106)
Creatinine, Ser: 0.86 mg/dL (ref 0.76–1.27)
GFR calc Af Amer: 88 mL/min/{1.73_m2} (ref >59)
GFR calc non Af Amer: 76 mL/min/{1.73_m2} (ref >59)
Glucose: 78 mg/dL (ref 65–99)
Potassium: 4.3 mmol/L (ref 3.5–5.2)
Sodium: 140 mmol/L (ref 134–144)

## 2018-08-14 LAB — TSH: TSH: 1.99 u[IU]/mL (ref 0.450–4.500)

## 2018-08-22 DIAGNOSIS — K2940 Chronic atrophic gastritis without bleeding: Secondary | ICD-10-CM | POA: Diagnosis not present

## 2018-08-22 DIAGNOSIS — K449 Diaphragmatic hernia without obstruction or gangrene: Secondary | ICD-10-CM | POA: Diagnosis not present

## 2018-08-22 DIAGNOSIS — R1310 Dysphagia, unspecified: Secondary | ICD-10-CM | POA: Diagnosis not present

## 2018-08-22 DIAGNOSIS — K222 Esophageal obstruction: Secondary | ICD-10-CM | POA: Diagnosis not present

## 2018-08-22 DIAGNOSIS — R933 Abnormal findings on diagnostic imaging of other parts of digestive tract: Secondary | ICD-10-CM | POA: Diagnosis not present

## 2018-09-05 ENCOUNTER — Other Ambulatory Visit: Payer: Self-pay | Admitting: Interventional Cardiology

## 2018-09-05 NOTE — Telephone Encounter (Signed)
Pt last saw Truitt Merle, NP on 08/13/18, last labs 08/13/18 Creat 0.86, age 85, weight 74.8, based on specified criteria pt is on appropriate dosage of Eliquis 5mg  BID.  Will refill rx.

## 2018-09-16 ENCOUNTER — Encounter: Payer: Self-pay | Admitting: Interventional Cardiology

## 2018-10-03 ENCOUNTER — Other Ambulatory Visit: Payer: Self-pay

## 2018-10-03 ENCOUNTER — Ambulatory Visit (INDEPENDENT_AMBULATORY_CARE_PROVIDER_SITE_OTHER): Payer: Medicare Other | Admitting: Interventional Cardiology

## 2018-10-03 ENCOUNTER — Encounter: Payer: Self-pay | Admitting: Interventional Cardiology

## 2018-10-03 VITALS — BP 112/58 | HR 58 | Ht 74.0 in | Wt 162.8 lb

## 2018-10-03 DIAGNOSIS — R42 Dizziness and giddiness: Secondary | ICD-10-CM

## 2018-10-03 DIAGNOSIS — I4819 Other persistent atrial fibrillation: Secondary | ICD-10-CM | POA: Diagnosis not present

## 2018-10-03 DIAGNOSIS — I10 Essential (primary) hypertension: Secondary | ICD-10-CM

## 2018-10-03 DIAGNOSIS — E782 Mixed hyperlipidemia: Secondary | ICD-10-CM

## 2018-10-03 DIAGNOSIS — Z7901 Long term (current) use of anticoagulants: Secondary | ICD-10-CM | POA: Diagnosis not present

## 2018-10-03 NOTE — Progress Notes (Addendum)
Cardiology Office Note:    Date:  10/24/2018   [name], DOB [birth year], MRN [id number]  PCP:  [name], MD  Cardiologist:  [name], MD   Referring MD: [name], MD   Chief Complaint  Patient presents with  . Atrial Fibrillation    History of Present Illness:    Justin Potter is a 85 y.o. male with a hx of atrial fibrillation and anticoagulation therapy.   He is accompanied by his son Nimai Burbach, retired Pension scheme manager.  Mr. Dinapoli is doing well.  His only complaint is that when he gets up to move around he feels lightheaded and foggy.  He has not fainted although he does feel off balance.  This is caused him to be reluctant to ambulate and be more physical.  He denies shortness of breath, chest pain, peripheral edema, orthopnea, and palpitations.  He has had no falls, head trauma, or blood in his urine or stool.  Past Medical History:  Diagnosis Date  . Anxiety   . Arthritis    "in the fingers" per pt  . Atrial fibrillation ([place])   . Bladder neck contracture   . Dysrhythmia    A-FIB / HX BRADYCARDIA - FOLLOWED BY DR. [place]  . Gross hematuria   . History of gout   . History of prostate cancer    S/P RADIOACTIVE SEED IMPLANTS  . History of squamous cell carcinoma of skin    EXCISION LOWER LIP AND RIGHT EAR  . History of TIA (transient ischemic attack)    probable tia no residual  09-09-2012  . Hyperlipidemia   . Hypertension   . Macular degeneration    both eyes  . Urethral stricture   . Urinary retention     Past Surgical History:  Procedure Laterality Date  . APPENDECTOMY    . CATARACT EXTRACTION W/ INTRAOCULAR LENS  IMPLANT, BILATERAL    . CHOLECYSTECTOMY  1977  . CYSTOSCOPY WITH URETHRAL DILATATION N/A 02/12/2013   Procedure: CYSTOSCOPY WITH BALLOON DILATATION OF URETHRAL STRICTURE AND BLADDER FULGERATION;  Surgeon: Bernestine Amass, MD;  Location: [facility];  Service: Urology;   Laterality: N/A;  . LAPAROSCOPIC APPENDECTOMY N/A 08/22/2013   Procedure: APPENDECTOMY LAPAROSCOPIC;  Surgeon: Pedro Earls, MD;  Location: [facility];  Service: General;  Laterality: N/A;  . [place]  . TONSILLECTOMY  as child  . TOTAL KNEE ARTHROPLASTY Right 11-23-2004    Current Medications: Current Meds  Medication Sig  . atorvastatin (LIPITOR) 10 MG tablet Take 10 mg by mouth every evening.   . clonazePAM (KLONOPIN) 0.5 MG tablet Take 0.5 mg by mouth 2 (two) times daily as needed for anxiety.   . docusate sodium (COLACE) 100 MG capsule Take 100 mg by mouth 2 (two) times daily. Reported on 10/20/2015  . ELIQUIS 5 MG TABS tablet TAKE 1 TABLET TWICE A DAY  . Multiple Vitamins-Minerals (OCUVITE PRESERVISION PO) Take 1 tablet by mouth every morning.   . Multiple Vitamins-Minerals (PRESERVISION AREDS 2 PO) Take 1 capsule by mouth 2 (two) times daily.  . [DISCONTINUED] amLODipine (NORVASC) 2.5 MG tablet Take 2.5 mg by mouth every evening.      Allergies:   Patient has no known allergies.   Social History   Socioeconomic History  . Marital status: Married    Spouse name: Not on file  . Number of children: Not on file  .  Years of education: Not on file  . Highest education level: Not on file  Occupational History  . Not on file  Social Needs  . Financial resource strain: Not on file  . Food insecurity:    Worry: Not on file    Inability: Not on file  . Transportation needs:    Medical: Not on file    Non-medical: Not on file  Tobacco Use  . Smoking status: Former Smoker    Packs/day: 1.50    Years: 35.00    Pack years: 52.50    Types: Cigarettes    Last attempt to quit: 01/30/1979    Years since quitting: 39.7  . Smokeless tobacco: Never Used  Substance and Sexual Activity  . Alcohol use: Yes    Comment: "5 days per week I drink 1 glass of wine"  . Drug use: No  . Sexual activity: Not Currently  Lifestyle  . Physical activity:    Days  per week: Not on file    Minutes per session: Not on file  . Stress: Not on file  Relationships  . Social connections:    Talks on phone: Not on file    Gets together: Not on file    Attends religious service: Not on file    Active member of club or organization: Not on file    Attends meetings of clubs or organizations: Not on file    Relationship status: Not on file  Other Topics Concern  . Not on file  Social History Narrative  . Not on file     Family History: The patient's family history includes Cancer in his father and mother.  ROS:   Please see the history of present illness.    Decreased appetite, difficulty swallowing, increasing lower extremity edema, snoring, anxiety, difficulty with balance and feeling funny in his head.  All other systems reviewed and are negative.  EKGs/Labs/Other Studies Reviewed:    The following studies were reviewed today: None  EKG:  EKG performed August 13, 2018 revealed A. fib with slow ventricular response nonspecific ST-T wave change.  Recent Labs: 08/13/2018: BUN 16; Creatinine, Ser 0.86; Hemoglobin 13.6; Platelets 189; Potassium 4.3; Sodium 140; TSH 1.990  Recent Lipid Panel No results found for: CHOL, TRIG, HDL, CHOLHDL, VLDL, LDLCALC, LDLDIRECT  Physical Exam:    VS:  BP (!) 112/58   Pulse (!) 58   Ht 6\' 2"  (1.88 m)   Wt 162 lb 12.8 oz (73.8 kg)   SpO2 99%   BMI 20.90 kg/m     Wt Readings from Last 3 Encounters:  10/03/18 162 lb 12.8 oz (73.8 kg)  08/13/18 165 lb (74.8 kg)  09/26/17 175 lb 6.4 oz (79.6 kg)  BP sitting 115/50 mmHg BP standing 106/50 mmHg.  GEN: Under, cachectic appearing, elderly.. No acute distress HEENT: Normal NECK: No JVD. LYMPHATICS: No lymphadenopathy CARDIAC: IIRR.  No murmur, no gallop, trace 2+ ankle and pedal edema VASCULAR: 2+ and irregularly irregular pulses, no bruits RESPIRATORY:  Clear to auscultation without rales, wheezing or rhonchi  ABDOMEN: Soft, non-tender, non-distended, No  pulsatile mass, MUSCULOSKELETAL: No deformity  SKIN: Warm and dry NEUROLOGIC:  Alert and oriented x 3 PSYCHIATRIC:  Normal affect   ASSESSMENT:    1. Persistent atrial fibrillation   2. Long term current use of anticoagulant therapy   3. Essential hypertension   4. Mixed hyperlipidemia   5. Dizziness    PLAN:    In order of problems listed above:  1.  Relatively slow irregularly irregular rhythm.  No change in current regimen. 2. No bleeding complications on apixaban 5 mg twice daily.  He is on age-appropriate dosing of apixaban 5 mg twice daily.  His weight is greater than 60 kg and kidney function is normal. 3. Blood pressure is too low.  Will discontinue the amlodipine.  Monitor blood pressures at home and notify if systolic blood pressures less than 140 mmHg. 4. LDL is less than 50.  He is on low intensity statin therapy.  I recommend discontinuation of statin therapy. 5. Dizziness and difficulty ambulating because of a foggy feeling could be related to cerebral hypotension.  This is the driver that leads to my recommendation to discontinue amlodipine.  Amlodipine discontinued and we should also consider discontinuation of atorvastatin.  Apixaban dose is appropriate for age and weight.  Clinical follow-up with me in 1 year.  Discussed discontinuation of statin therapy with Dr. Deforest Hoyles.   Medication Adjustments/Labs and Tests Ordered: Current medicines are reviewed at length with the patient today.  Concerns regarding medicines are outlined above.  No orders of the defined types were placed in this encounter.  No orders of the defined types were placed in this encounter.   Patient Instructions  Medication Instructions:  1) DISCONTINUE Amlodipine  If you need a refill on your cardiac medications before your next appointment, please call your pharmacy.   Lab work: None If you have labs (blood work) drawn today and your tests are completely normal, you will receive your  results only by: Marland Kitchen MyChart Message (if you have MyChart) OR . A paper copy in the mail If you have any lab test that is abnormal or we need to change your treatment, we will call you to review the results.  Testing/Procedures: None  Follow-Up: At [facility], you and your health needs are our priority.  As part of our continuing mission to provide you with exceptional heart care, we have created designated Provider Care Teams.  These Care Teams include your primary Cardiologist (physician) and Advanced Practice Providers (APPs -  Physician Assistants and Nurse Practitioners) who all work together to provide you with the care you need, when you need it. You will need a follow up appointment in 12 months.  Please call our office 2 months in advance to schedule this appointment.  You may see [name], MD or one of the following Advanced Practice Providers on your designated Care Team:   Truitt Merle, NP Cecilie Kicks, NP . Kathyrn Drown, NP  Any Other Special Instructions Will Be Listed Below (If Applicable).  Monitor your blood pressure a few times a week and call us with those recordings in a couple of weeks.       Signed, [name], MD  10/24/2018 4:32 PM    [facility]

## 2018-10-03 NOTE — Patient Instructions (Signed)
Medication Instructions:  1) DISCONTINUE Amlodipine  If you need a refill on your cardiac medications before your next appointment, please call your pharmacy.   Lab work: None If you have labs (blood work) drawn today and your tests are completely normal, you will receive your results only by: Marland Kitchen MyChart Message (if you have MyChart) OR . A paper copy in the mail If you have any lab test that is abnormal or we need to change your treatment, we will call you to review the results.  Testing/Procedures: None  Follow-Up: At [facility], you and your health needs are our priority.  As part of our continuing mission to provide you with exceptional heart care, we have created designated Provider Care Teams.  These Care Teams include your primary Cardiologist (physician) and Advanced Practice Providers (APPs -  Physician Assistants and Nurse Practitioners) who all work together to provide you with the care you need, when you need it. You will need a follow up appointment in 12 months.  Please call our office 2 months in advance to schedule this appointment.  You may see [name], MD or one of the following Advanced Practice Providers on your designated Care Team:   Truitt Merle, NP Cecilie Kicks, NP . Kathyrn Drown, NP  Any Other Special Instructions Will Be Listed Below (If Applicable).  Monitor your blood pressure a few times a week and call us with those recordings in a couple of weeks.

## 2018-10-21 DIAGNOSIS — R310 Gross hematuria: Secondary | ICD-10-CM | POA: Diagnosis not present

## 2018-10-23 ENCOUNTER — Telehealth: Payer: Self-pay | Admitting: Interventional Cardiology

## 2018-10-23 NOTE — Telephone Encounter (Signed)
New Message    Pts son Dr Adela Lank is calling to speak with the nurse to talk to her about the pts BP readings  Please call

## 2018-10-23 NOTE — Telephone Encounter (Signed)
Pts/ son called with an update; Amlodipine was stopped as advised. Current bp 130/65 hr 48 Pt denies dizziness no lightheadedness. Family member states they make him get up slowly. Family aware Dr Tamala Julian is out of office but states all is fine and just wanted to provide current bp/p.

## 2018-10-24 NOTE — Telephone Encounter (Signed)
Got it.

## 2018-11-15 DIAGNOSIS — R351 Nocturia: Secondary | ICD-10-CM | POA: Diagnosis not present

## 2018-11-20 DIAGNOSIS — N320 Bladder-neck obstruction: Secondary | ICD-10-CM | POA: Diagnosis not present

## 2018-11-20 DIAGNOSIS — N3942 Incontinence without sensory awareness: Secondary | ICD-10-CM | POA: Diagnosis not present

## 2018-11-20 DIAGNOSIS — R310 Gross hematuria: Secondary | ICD-10-CM | POA: Diagnosis not present

## 2018-11-20 DIAGNOSIS — Z8546 Personal history of malignant neoplasm of prostate: Secondary | ICD-10-CM | POA: Diagnosis not present

## 2018-11-25 ENCOUNTER — Telehealth: Payer: Self-pay | Admitting: Interventional Cardiology

## 2018-11-25 DIAGNOSIS — N200 Calculus of kidney: Secondary | ICD-10-CM | POA: Diagnosis not present

## 2018-11-25 DIAGNOSIS — Z8546 Personal history of malignant neoplasm of prostate: Secondary | ICD-10-CM | POA: Diagnosis not present

## 2018-11-25 NOTE — Telephone Encounter (Signed)
Spoke with son and made him aware of recommendations.  Son verbalized understanding and was appreciative for call.

## 2018-11-25 NOTE — Telephone Encounter (Signed)
Son is calling because dad is having a procedure on Friday and he is having hematuria. He has been advised to stop ELIQUIS 5 MG TABS tablet through Friday. Dr. Valere Dross wants to know if Dr Tamala Julian is okay with his dad going off the Eliquis this week and then how long should he be off of it after his procedure on Friday?

## 2018-11-25 NOTE — Telephone Encounter (Signed)
I think he is okay for the procedure. How long can be better estimated by Dr. Carmelina Noun. He may want him off several days which I think will be safe but getting back on is important.

## 2018-11-25 NOTE — Telephone Encounter (Signed)
Pt's son calling, DPR on file.  Pt having a cystoscopy on Friday due to having significant hematuria and clots passing in his urine.  Dr. Jeffie Pollock performing procedure.  Not planning to do any cutting but may do a dilatation of stricture.  Pt is already off of Eliquis in preparation for procedure. Scan of abd/pelvis did not show any other issues. Son wanted to make sure Dr. Tamala Julian was comfortable with this and how long he felt would be ok for pt to be off of Eliquis after the procedure to allow healing?    Advised I would send to Dr. Tamala Julian for review.

## 2018-11-29 DIAGNOSIS — R310 Gross hematuria: Secondary | ICD-10-CM | POA: Diagnosis not present

## 2018-11-29 DIAGNOSIS — N35012 Post-traumatic membranous urethral stricture: Secondary | ICD-10-CM | POA: Diagnosis not present

## 2018-12-18 DIAGNOSIS — N3942 Incontinence without sensory awareness: Secondary | ICD-10-CM | POA: Diagnosis not present

## 2018-12-18 DIAGNOSIS — N35012 Post-traumatic membranous urethral stricture: Secondary | ICD-10-CM | POA: Diagnosis not present

## 2019-01-08 DIAGNOSIS — H353223 Exudative age-related macular degeneration, left eye, with inactive scar: Secondary | ICD-10-CM | POA: Diagnosis not present

## 2019-01-08 DIAGNOSIS — H52203 Unspecified astigmatism, bilateral: Secondary | ICD-10-CM | POA: Diagnosis not present

## 2019-01-08 DIAGNOSIS — H02102 Unspecified ectropion of right lower eyelid: Secondary | ICD-10-CM | POA: Diagnosis not present

## 2019-01-08 DIAGNOSIS — H02105 Unspecified ectropion of left lower eyelid: Secondary | ICD-10-CM | POA: Diagnosis not present

## 2019-01-15 DIAGNOSIS — D485 Neoplasm of uncertain behavior of skin: Secondary | ICD-10-CM | POA: Diagnosis not present

## 2019-01-15 DIAGNOSIS — L821 Other seborrheic keratosis: Secondary | ICD-10-CM | POA: Diagnosis not present

## 2019-01-15 DIAGNOSIS — D225 Melanocytic nevi of trunk: Secondary | ICD-10-CM | POA: Diagnosis not present

## 2019-01-15 DIAGNOSIS — Z85828 Personal history of other malignant neoplasm of skin: Secondary | ICD-10-CM | POA: Diagnosis not present

## 2019-01-15 DIAGNOSIS — D1801 Hemangioma of skin and subcutaneous tissue: Secondary | ICD-10-CM | POA: Diagnosis not present

## 2019-01-15 DIAGNOSIS — C44722 Squamous cell carcinoma of skin of right lower limb, including hip: Secondary | ICD-10-CM | POA: Diagnosis not present

## 2019-01-15 DIAGNOSIS — L570 Actinic keratosis: Secondary | ICD-10-CM | POA: Diagnosis not present

## 2019-02-12 DIAGNOSIS — R7309 Other abnormal glucose: Secondary | ICD-10-CM | POA: Diagnosis not present

## 2019-02-12 DIAGNOSIS — R269 Unspecified abnormalities of gait and mobility: Secondary | ICD-10-CM | POA: Diagnosis not present

## 2019-02-12 DIAGNOSIS — G459 Transient cerebral ischemic attack, unspecified: Secondary | ICD-10-CM | POA: Diagnosis not present

## 2019-02-12 DIAGNOSIS — H548 Legal blindness, as defined in USA: Secondary | ICD-10-CM | POA: Diagnosis not present

## 2019-02-12 DIAGNOSIS — M1990 Unspecified osteoarthritis, unspecified site: Secondary | ICD-10-CM | POA: Diagnosis not present

## 2019-02-12 DIAGNOSIS — Z7901 Long term (current) use of anticoagulants: Secondary | ICD-10-CM | POA: Diagnosis not present

## 2019-02-12 DIAGNOSIS — R001 Bradycardia, unspecified: Secondary | ICD-10-CM | POA: Diagnosis not present

## 2019-02-12 DIAGNOSIS — E7800 Pure hypercholesterolemia, unspecified: Secondary | ICD-10-CM | POA: Diagnosis not present

## 2019-02-12 DIAGNOSIS — C61 Malignant neoplasm of prostate: Secondary | ICD-10-CM | POA: Diagnosis not present

## 2019-02-12 DIAGNOSIS — Z0000 Encounter for general adult medical examination without abnormal findings: Secondary | ICD-10-CM | POA: Diagnosis not present

## 2019-02-12 DIAGNOSIS — Z23 Encounter for immunization: Secondary | ICD-10-CM | POA: Diagnosis not present

## 2019-02-12 DIAGNOSIS — Z1389 Encounter for screening for other disorder: Secondary | ICD-10-CM | POA: Diagnosis not present

## 2019-02-12 DIAGNOSIS — I480 Paroxysmal atrial fibrillation: Secondary | ICD-10-CM | POA: Diagnosis not present

## 2019-02-12 DIAGNOSIS — I10 Essential (primary) hypertension: Secondary | ICD-10-CM | POA: Diagnosis not present

## 2019-02-20 DIAGNOSIS — R2681 Unsteadiness on feet: Secondary | ICD-10-CM | POA: Diagnosis not present

## 2019-02-20 DIAGNOSIS — M150 Primary generalized (osteo)arthritis: Secondary | ICD-10-CM | POA: Diagnosis not present

## 2019-02-20 DIAGNOSIS — M6281 Muscle weakness (generalized): Secondary | ICD-10-CM | POA: Diagnosis not present

## 2019-02-20 DIAGNOSIS — R262 Difficulty in walking, not elsewhere classified: Secondary | ICD-10-CM | POA: Diagnosis not present

## 2019-02-24 DIAGNOSIS — R262 Difficulty in walking, not elsewhere classified: Secondary | ICD-10-CM | POA: Diagnosis not present

## 2019-02-24 DIAGNOSIS — R2681 Unsteadiness on feet: Secondary | ICD-10-CM | POA: Diagnosis not present

## 2019-02-24 DIAGNOSIS — M6281 Muscle weakness (generalized): Secondary | ICD-10-CM | POA: Diagnosis not present

## 2019-02-24 DIAGNOSIS — M150 Primary generalized (osteo)arthritis: Secondary | ICD-10-CM | POA: Diagnosis not present

## 2019-02-26 DIAGNOSIS — M6281 Muscle weakness (generalized): Secondary | ICD-10-CM | POA: Diagnosis not present

## 2019-02-26 DIAGNOSIS — R2681 Unsteadiness on feet: Secondary | ICD-10-CM | POA: Diagnosis not present

## 2019-02-26 DIAGNOSIS — M150 Primary generalized (osteo)arthritis: Secondary | ICD-10-CM | POA: Diagnosis not present

## 2019-02-26 DIAGNOSIS — R262 Difficulty in walking, not elsewhere classified: Secondary | ICD-10-CM | POA: Diagnosis not present

## 2019-02-27 DIAGNOSIS — M6281 Muscle weakness (generalized): Secondary | ICD-10-CM | POA: Diagnosis not present

## 2019-02-27 DIAGNOSIS — R2681 Unsteadiness on feet: Secondary | ICD-10-CM | POA: Diagnosis not present

## 2019-02-27 DIAGNOSIS — M150 Primary generalized (osteo)arthritis: Secondary | ICD-10-CM | POA: Diagnosis not present

## 2019-02-27 DIAGNOSIS — R262 Difficulty in walking, not elsewhere classified: Secondary | ICD-10-CM | POA: Diagnosis not present

## 2019-03-03 ENCOUNTER — Other Ambulatory Visit: Payer: Self-pay | Admitting: Interventional Cardiology

## 2019-03-03 DIAGNOSIS — M6281 Muscle weakness (generalized): Secondary | ICD-10-CM | POA: Diagnosis not present

## 2019-03-03 DIAGNOSIS — M150 Primary generalized (osteo)arthritis: Secondary | ICD-10-CM | POA: Diagnosis not present

## 2019-03-03 DIAGNOSIS — R262 Difficulty in walking, not elsewhere classified: Secondary | ICD-10-CM | POA: Diagnosis not present

## 2019-03-03 DIAGNOSIS — R2681 Unsteadiness on feet: Secondary | ICD-10-CM | POA: Diagnosis not present

## 2019-03-04 DIAGNOSIS — R2681 Unsteadiness on feet: Secondary | ICD-10-CM | POA: Diagnosis not present

## 2019-03-04 DIAGNOSIS — M150 Primary generalized (osteo)arthritis: Secondary | ICD-10-CM | POA: Diagnosis not present

## 2019-03-04 DIAGNOSIS — M6281 Muscle weakness (generalized): Secondary | ICD-10-CM | POA: Diagnosis not present

## 2019-03-04 DIAGNOSIS — R262 Difficulty in walking, not elsewhere classified: Secondary | ICD-10-CM | POA: Diagnosis not present

## 2019-03-04 NOTE — Telephone Encounter (Signed)
Prescription refill request for Eliquis received.  Last office visit: Dr. Tamala Julian (10-03-2018) Scr: 0.86 ( 08-13-2018) Age: 85 y.o. Weight: 73.8kg (10-03-2018)  Prescription refill sent.

## 2019-03-06 DIAGNOSIS — M6281 Muscle weakness (generalized): Secondary | ICD-10-CM | POA: Diagnosis not present

## 2019-03-06 DIAGNOSIS — R2681 Unsteadiness on feet: Secondary | ICD-10-CM | POA: Diagnosis not present

## 2019-03-06 DIAGNOSIS — M150 Primary generalized (osteo)arthritis: Secondary | ICD-10-CM | POA: Diagnosis not present

## 2019-03-06 DIAGNOSIS — R262 Difficulty in walking, not elsewhere classified: Secondary | ICD-10-CM | POA: Diagnosis not present

## 2019-03-10 DIAGNOSIS — R262 Difficulty in walking, not elsewhere classified: Secondary | ICD-10-CM | POA: Diagnosis not present

## 2019-03-10 DIAGNOSIS — R2681 Unsteadiness on feet: Secondary | ICD-10-CM | POA: Diagnosis not present

## 2019-03-10 DIAGNOSIS — M150 Primary generalized (osteo)arthritis: Secondary | ICD-10-CM | POA: Diagnosis not present

## 2019-03-10 DIAGNOSIS — M6281 Muscle weakness (generalized): Secondary | ICD-10-CM | POA: Diagnosis not present

## 2019-03-12 DIAGNOSIS — M150 Primary generalized (osteo)arthritis: Secondary | ICD-10-CM | POA: Diagnosis not present

## 2019-03-12 DIAGNOSIS — R262 Difficulty in walking, not elsewhere classified: Secondary | ICD-10-CM | POA: Diagnosis not present

## 2019-03-12 DIAGNOSIS — M6281 Muscle weakness (generalized): Secondary | ICD-10-CM | POA: Diagnosis not present

## 2019-03-12 DIAGNOSIS — R2681 Unsteadiness on feet: Secondary | ICD-10-CM | POA: Diagnosis not present

## 2019-03-14 DIAGNOSIS — M150 Primary generalized (osteo)arthritis: Secondary | ICD-10-CM | POA: Diagnosis not present

## 2019-03-14 DIAGNOSIS — R2681 Unsteadiness on feet: Secondary | ICD-10-CM | POA: Diagnosis not present

## 2019-03-14 DIAGNOSIS — M6281 Muscle weakness (generalized): Secondary | ICD-10-CM | POA: Diagnosis not present

## 2019-03-14 DIAGNOSIS — R262 Difficulty in walking, not elsewhere classified: Secondary | ICD-10-CM | POA: Diagnosis not present

## 2019-03-17 DIAGNOSIS — M150 Primary generalized (osteo)arthritis: Secondary | ICD-10-CM | POA: Diagnosis not present

## 2019-03-17 DIAGNOSIS — R262 Difficulty in walking, not elsewhere classified: Secondary | ICD-10-CM | POA: Diagnosis not present

## 2019-03-17 DIAGNOSIS — R2681 Unsteadiness on feet: Secondary | ICD-10-CM | POA: Diagnosis not present

## 2019-03-17 DIAGNOSIS — M6281 Muscle weakness (generalized): Secondary | ICD-10-CM | POA: Diagnosis not present

## 2019-03-19 DIAGNOSIS — N3942 Incontinence without sensory awareness: Secondary | ICD-10-CM | POA: Diagnosis not present

## 2019-03-20 DIAGNOSIS — M150 Primary generalized (osteo)arthritis: Secondary | ICD-10-CM | POA: Diagnosis not present

## 2019-03-20 DIAGNOSIS — M6281 Muscle weakness (generalized): Secondary | ICD-10-CM | POA: Diagnosis not present

## 2019-03-20 DIAGNOSIS — R262 Difficulty in walking, not elsewhere classified: Secondary | ICD-10-CM | POA: Diagnosis not present

## 2019-03-20 DIAGNOSIS — Z119 Encounter for screening for infectious and parasitic diseases, unspecified: Secondary | ICD-10-CM | POA: Diagnosis not present

## 2019-03-20 DIAGNOSIS — R2681 Unsteadiness on feet: Secondary | ICD-10-CM | POA: Diagnosis not present

## 2019-03-24 DIAGNOSIS — R262 Difficulty in walking, not elsewhere classified: Secondary | ICD-10-CM | POA: Diagnosis not present

## 2019-03-24 DIAGNOSIS — M6281 Muscle weakness (generalized): Secondary | ICD-10-CM | POA: Diagnosis not present

## 2019-03-24 DIAGNOSIS — M150 Primary generalized (osteo)arthritis: Secondary | ICD-10-CM | POA: Diagnosis not present

## 2019-03-24 DIAGNOSIS — R2681 Unsteadiness on feet: Secondary | ICD-10-CM | POA: Diagnosis not present

## 2019-03-26 DIAGNOSIS — R262 Difficulty in walking, not elsewhere classified: Secondary | ICD-10-CM | POA: Diagnosis not present

## 2019-03-26 DIAGNOSIS — M150 Primary generalized (osteo)arthritis: Secondary | ICD-10-CM | POA: Diagnosis not present

## 2019-03-26 DIAGNOSIS — R2681 Unsteadiness on feet: Secondary | ICD-10-CM | POA: Diagnosis not present

## 2019-03-26 DIAGNOSIS — M6281 Muscle weakness (generalized): Secondary | ICD-10-CM | POA: Diagnosis not present

## 2019-04-02 DIAGNOSIS — R262 Difficulty in walking, not elsewhere classified: Secondary | ICD-10-CM | POA: Diagnosis not present

## 2019-04-02 DIAGNOSIS — M150 Primary generalized (osteo)arthritis: Secondary | ICD-10-CM | POA: Diagnosis not present

## 2019-04-02 DIAGNOSIS — R2681 Unsteadiness on feet: Secondary | ICD-10-CM | POA: Diagnosis not present

## 2019-04-02 DIAGNOSIS — M6281 Muscle weakness (generalized): Secondary | ICD-10-CM | POA: Diagnosis not present

## 2019-04-04 DIAGNOSIS — R262 Difficulty in walking, not elsewhere classified: Secondary | ICD-10-CM | POA: Diagnosis not present

## 2019-04-04 DIAGNOSIS — M6281 Muscle weakness (generalized): Secondary | ICD-10-CM | POA: Diagnosis not present

## 2019-04-04 DIAGNOSIS — M150 Primary generalized (osteo)arthritis: Secondary | ICD-10-CM | POA: Diagnosis not present

## 2019-04-04 DIAGNOSIS — R2681 Unsteadiness on feet: Secondary | ICD-10-CM | POA: Diagnosis not present

## 2019-04-07 DIAGNOSIS — M6281 Muscle weakness (generalized): Secondary | ICD-10-CM | POA: Diagnosis not present

## 2019-04-07 DIAGNOSIS — R2681 Unsteadiness on feet: Secondary | ICD-10-CM | POA: Diagnosis not present

## 2019-04-07 DIAGNOSIS — R262 Difficulty in walking, not elsewhere classified: Secondary | ICD-10-CM | POA: Diagnosis not present

## 2019-04-07 DIAGNOSIS — M150 Primary generalized (osteo)arthritis: Secondary | ICD-10-CM | POA: Diagnosis not present

## 2019-04-09 DIAGNOSIS — R2681 Unsteadiness on feet: Secondary | ICD-10-CM | POA: Diagnosis not present

## 2019-04-09 DIAGNOSIS — R262 Difficulty in walking, not elsewhere classified: Secondary | ICD-10-CM | POA: Diagnosis not present

## 2019-04-09 DIAGNOSIS — M150 Primary generalized (osteo)arthritis: Secondary | ICD-10-CM | POA: Diagnosis not present

## 2019-04-09 DIAGNOSIS — M6281 Muscle weakness (generalized): Secondary | ICD-10-CM | POA: Diagnosis not present

## 2019-04-14 DIAGNOSIS — M150 Primary generalized (osteo)arthritis: Secondary | ICD-10-CM | POA: Diagnosis not present

## 2019-04-14 DIAGNOSIS — R2681 Unsteadiness on feet: Secondary | ICD-10-CM | POA: Diagnosis not present

## 2019-04-14 DIAGNOSIS — R262 Difficulty in walking, not elsewhere classified: Secondary | ICD-10-CM | POA: Diagnosis not present

## 2019-04-14 DIAGNOSIS — M6281 Muscle weakness (generalized): Secondary | ICD-10-CM | POA: Diagnosis not present

## 2019-04-16 DIAGNOSIS — R262 Difficulty in walking, not elsewhere classified: Secondary | ICD-10-CM | POA: Diagnosis not present

## 2019-04-16 DIAGNOSIS — R2681 Unsteadiness on feet: Secondary | ICD-10-CM | POA: Diagnosis not present

## 2019-04-16 DIAGNOSIS — M150 Primary generalized (osteo)arthritis: Secondary | ICD-10-CM | POA: Diagnosis not present

## 2019-04-16 DIAGNOSIS — M6281 Muscle weakness (generalized): Secondary | ICD-10-CM | POA: Diagnosis not present

## 2019-06-02 DIAGNOSIS — H353212 Exudative age-related macular degeneration, right eye, with inactive choroidal neovascularization: Secondary | ICD-10-CM | POA: Diagnosis not present

## 2019-06-02 DIAGNOSIS — H353114 Nonexudative age-related macular degeneration, right eye, advanced atrophic with subfoveal involvement: Secondary | ICD-10-CM | POA: Diagnosis not present

## 2019-06-02 DIAGNOSIS — H353222 Exudative age-related macular degeneration, left eye, with inactive choroidal neovascularization: Secondary | ICD-10-CM | POA: Diagnosis not present

## 2019-06-02 DIAGNOSIS — H353124 Nonexudative age-related macular degeneration, left eye, advanced atrophic with subfoveal involvement: Secondary | ICD-10-CM | POA: Diagnosis not present

## 2019-06-13 DIAGNOSIS — I4891 Unspecified atrial fibrillation: Secondary | ICD-10-CM | POA: Diagnosis not present

## 2019-06-13 DIAGNOSIS — R410 Disorientation, unspecified: Secondary | ICD-10-CM | POA: Diagnosis not present

## 2019-06-13 DIAGNOSIS — R42 Dizziness and giddiness: Secondary | ICD-10-CM | POA: Diagnosis not present

## 2019-06-15 ENCOUNTER — Telehealth: Payer: Self-pay | Admitting: Cardiology

## 2019-06-15 ENCOUNTER — Other Ambulatory Visit: Payer: Self-pay | Admitting: Cardiology

## 2019-06-15 DIAGNOSIS — I4821 Permanent atrial fibrillation: Secondary | ICD-10-CM

## 2019-06-15 NOTE — Telephone Encounter (Signed)
    Paged by answering service by patient's son who is a retired Pension scheme manager.  Son states that over the last several days, patient has been feeling more fatigued.  He has known persistent/chronic atrial fibrillation for which he is controlled with only Eliquis therapy.  He has baseline slow atrial fibrillation with rates in the low 50s.  Son states that BP at PCPs office from last Thursday was 110/55 with a heart rate of 50.  Reports that this morning his blood pressure was 90/40 with a heart rate of 42 bpm.  Patient is stable without signs or symptoms of dizziness, orthostatic changes or presyncopal or syncopal episodes.  Son is with him at his side.  He is not on any AV blocking agents or antihypertensive medications.  At PCPs office there was discussion regarding monitor placement.  Son is asking if we can do this for him.  Discussed placing a Zio patch for 1 week to assess for pausing or other arrhythmias.  In the meantime, ED precautions reviewed with son.  Will route this note to Dr. Tamala Julian, patient's primary cardiologist.  Will need follow-up after monitor results if not sooner.  Son to call office Monday a.m.  Kathyrn Drown NP-C [place] Pager: [phone]

## 2019-06-16 NOTE — Telephone Encounter (Signed)
Thanks,

## 2019-06-23 ENCOUNTER — Telehealth: Payer: Self-pay | Admitting: Interventional Cardiology

## 2019-06-23 NOTE — Telephone Encounter (Signed)
7 day ZIO AT long term monitor-Live telemetry to be mailed to the patients home.  Instructions reviewed briefly as they are included in the monitor kit.

## 2019-06-23 NOTE — Telephone Encounter (Addendum)
Son states pt seen PCP 11/16 for possible TIA and monitor placement was discussed.  Vitals at that visit were 110/60, HR 52.  On 11/22, son contacted on call service due to pt's vitals being 90/40, HR 40, checked manually.  Son is a Engineer, drilling.  Son states pt's vitals all last week were low, BP around 90/40-60, HRs went from 50s to 40s.  Son had to end call because he was at the doctor with his daughter and asked that I call back in 15-20 mins.

## 2019-06-23 NOTE — Telephone Encounter (Signed)
Reroute

## 2019-06-23 NOTE — Telephone Encounter (Signed)
Dr. Tamala Julian called and spoke with son.  Pt scheduled to come in and see [place] Wednesday.

## 2019-06-23 NOTE — Telephone Encounter (Signed)
°  Patient c/o Palpitations:  High priority if patient c/o lightheadedness, shortness of breath, or chest pain  1) How long have you had palpitations/irregular HR/ Afib? Are you having the symptoms now? Afib has been going on for years but, patient is currently experiencing Afib    2) Are you currently experiencing lightheadedness, SOB or CP?   Yes, lightheadedness   3) Do you have a history of afib (atrial fibrillation) or irregular heart rhythm?   Yes  4) Have you checked your BP or HR? (document readings if available):  BP 90/40 HR ranges from 40-42  5) Are you experiencing any other symptoms?   Nothing besides lightheadedness    Patients son is calling in regards to being concerned with patients low HR and Afib. He states he is concerned patient is going to have a stroke. Patients son also states he called in regards to this 8 days ago and hasn't heard anything back regarding it. Please advise.

## 2019-06-23 NOTE — Telephone Encounter (Signed)
Patient son is calling in regards to wanting to know when they will be receiving patients heart monitor.

## 2019-06-23 NOTE — Progress Notes (Signed)
Cardiology Office Note:    Date:  06/23/2019   [name], DOB 03-20-1926, MRN [id number]  PCP:  [name], MD  Cardiologist:  [name], MD   Referring MD: [name], MD   No chief complaint on file.   History of Present Illness:    Justin Potter is a 85 y.o. male with a hx of atrial fibrillation on anticoagulation therapy.Recent decreased HR and low BP with associated weakness.  Justin Potter is doing not as well as he has in the past.  For the last several weeks low blood pressure has been noted as well as heart rates that have been running less than 50 bpm and at times as low as 43 bpm.  She was taken off of medications that affect AV node conduction greater than a year ago because of slowing heart rates.  He has not had syncope, chest pain, or shortness of breath.  He does feel weak and tired when he stands and when he attempts to ambulate.  Appetite is not been excellent.  He is poor about fluid intake.  Past Medical History:  Diagnosis Date  . Anxiety   . Arthritis    "in the fingers" per pt  . Atrial fibrillation ([place])   . Bladder neck contracture   . Dysrhythmia    A-FIB / HX BRADYCARDIA - FOLLOWED BY DR. Molino  . Gross hematuria   . History of gout   . History of prostate cancer    S/P RADIOACTIVE SEED IMPLANTS  . History of squamous cell carcinoma of skin    EXCISION LOWER LIP AND RIGHT EAR  . History of TIA (transient ischemic attack)    probable tia no residual  09-09-2012  . Hyperlipidemia   . Hypertension   . Macular degeneration    both eyes  . Urethral stricture   . Urinary retention     Past Surgical History:  Procedure Laterality Date  . APPENDECTOMY    . CATARACT EXTRACTION W/ INTRAOCULAR LENS  IMPLANT, BILATERAL    . CHOLECYSTECTOMY  1977  . CYSTOSCOPY WITH URETHRAL DILATATION N/A 02/12/2013   Procedure: CYSTOSCOPY WITH BALLOON DILATATION OF URETHRAL STRICTURE AND BLADDER  FULGERATION;  Surgeon: Bernestine Amass, MD;  Location: [facility];  Service: Urology;  Laterality: N/A;  . LAPAROSCOPIC APPENDECTOMY N/A 08/22/2013   Procedure: APPENDECTOMY LAPAROSCOPIC;  Surgeon: Pedro Earls, MD;  Location: [facility];  Service: General;  Laterality: N/A;  . [place]  . TONSILLECTOMY  as child  . TOTAL KNEE ARTHROPLASTY Right 11-23-2004    Current Medications: No outpatient medications have been marked as taking for the 06/25/19 encounter (Appointment) with Belva Crome, MD.     Allergies:   Patient has no known allergies.   Social History   Socioeconomic History  . Marital status: Married    Spouse name: Not on file  . Number of children: Not on file  . Years of education: Not on file  . Highest education level: Not on file  Occupational History  . Not on file  Social Needs  . Financial resource strain: Not on file  . Food insecurity    Worry: Not on file    Inability: Not on file  . Transportation needs    Medical: Not on file    Non-medical: Not on file  Tobacco Use  . Smoking status: Former Smoker  Packs/day: 1.50    Years: 35.00    Pack years: 52.50    Types: Cigarettes    Quit date: 01/30/1979    Years since quitting: 40.4  . Smokeless tobacco: Never Used  Substance and Sexual Activity  . Alcohol use: Yes    Comment: "5 days per week I drink 1 glass of wine"  . Drug use: No  . Sexual activity: Not Currently  Lifestyle  . Physical activity    Days per week: Not on file    Minutes per session: Not on file  . Stress: Not on file  Relationships  . Social Herbalist on phone: Not on file    Gets together: Not on file    Attends religious service: Not on file    Active member of club or organization: Not on file    Attends meetings of clubs or organizations: Not on file    Relationship status: Not on file  Other Topics Concern  . Not on file  Social History Narrative  . Not on file     Family  History: The patient's family history includes Cancer in his father and mother.  ROS:   Please see the history of present illness.    Feels tired.  Not much appetite.  All other systems reviewed and are negative.  EKGs/Labs/Other Studies Reviewed:    The following studies were reviewed today: No new data  EKG:  EKG ECG today demonstrates atrial fibrillation with slow ventricular response at 45 bpm.  When compared to the prior tracing, the rate is slower when compared to January 2020 when the heart rate was 54 bpm.  In January 2018, the heart rate was 57 bpm.  Recent Labs: 08/13/2018: BUN 16; Creatinine, Ser 0.86; Hemoglobin 13.6; Platelets 189; Potassium 4.3; Sodium 140; TSH 1.990  Recent Lipid Panel No results found for: CHOL, TRIG, HDL, CHOLHDL, VLDL, LDLCALC, LDLDIRECT  Physical Exam:    VS:  There were no vitals taken for this visit.    Wt Readings from Last 3 Encounters:  10/03/18 162 lb 12.8 oz (73.8 kg)  08/13/18 165 lb (74.8 kg)  09/26/17 175 lb 6.4 oz (79.6 kg)     GEN: Elderly and frail. No acute distress HEENT: Normal NECK: No JVD. LYMPHATICS: No lymphadenopathy CARDIAC: Irregularly irregular RR without murmur, gallop, or edema. VASCULAR:  Normal Pulses. No bruits. RESPIRATORY:  Clear to auscultation without rales, wheezing or rhonchi  ABDOMEN: Soft, non-tender, non-distended, No pulsatile mass, MUSCULOSKELETAL: No deformity  SKIN: Warm and dry NEUROLOGIC:  Alert and oriented x 3 PSYCHIATRIC:  Normal affect   ASSESSMENT:    1. Slow heart rate   2. Persistent atrial fibrillation ([place])   3. Long term current use of anticoagulant therapy   4. Essential hypertension   5. Mixed hyperlipidemia    PLAN:    In order of problems listed above:  1. Atrial fibrillation slow ventricular response.  Still has normal AV conduction.  The slow heart rate implies AV node disease.  Fatigue and recent low blood pressures are possibly related.  A 7-day monitor will be  performed to get a better sense of heart rates over time. 2. Persistent atrial fibrillation is documented. 3. Continue Eliquis. 4. Blood pressure is relatively low compared to prior.  I encouraged fluid and salt intake. 5. Not addressed.  We will discuss with electrophysiology to determine if any further testing and or pacemaker is indicated.   Medication Adjustments/Labs and Tests Ordered: Current  medicines are reviewed at length with the patient today.  Concerns regarding medicines are outlined above.  No orders of the defined types were placed in this encounter.  No orders of the defined types were placed in this encounter.   There are no Patient Instructions on file for this visit.   Signed, [name], MD  06/23/2019 8:48 PM    [place]

## 2019-06-25 ENCOUNTER — Encounter (INDEPENDENT_AMBULATORY_CARE_PROVIDER_SITE_OTHER): Payer: Self-pay

## 2019-06-25 ENCOUNTER — Encounter: Payer: Self-pay | Admitting: Interventional Cardiology

## 2019-06-25 ENCOUNTER — Ambulatory Visit (INDEPENDENT_AMBULATORY_CARE_PROVIDER_SITE_OTHER): Payer: Medicare Other | Admitting: Interventional Cardiology

## 2019-06-25 ENCOUNTER — Other Ambulatory Visit: Payer: Self-pay

## 2019-06-25 VITALS — BP 92/62 | HR 45 | Ht 74.0 in | Wt 162.8 lb

## 2019-06-25 DIAGNOSIS — I4819 Other persistent atrial fibrillation: Secondary | ICD-10-CM

## 2019-06-25 DIAGNOSIS — E782 Mixed hyperlipidemia: Secondary | ICD-10-CM | POA: Diagnosis not present

## 2019-06-25 DIAGNOSIS — I10 Essential (primary) hypertension: Secondary | ICD-10-CM | POA: Diagnosis not present

## 2019-06-25 DIAGNOSIS — Z7901 Long term (current) use of anticoagulants: Secondary | ICD-10-CM | POA: Diagnosis not present

## 2019-06-25 DIAGNOSIS — R001 Bradycardia, unspecified: Secondary | ICD-10-CM | POA: Diagnosis not present

## 2019-06-25 NOTE — Patient Instructions (Signed)

## 2019-07-01 ENCOUNTER — Telehealth: Payer: Self-pay | Admitting: Interventional Cardiology

## 2019-07-01 NOTE — Telephone Encounter (Signed)
Patients son called.  Per [employer], package is in transit,December 6,2020,  arriving late.  Package left [place] [employer] distribution center December, 2, 2020 at 4:26PM.  Package tracking number [id number]. Justin Potter asked to notify Dr. Tamala Julian his question was answered.

## 2019-07-01 NOTE — Telephone Encounter (Signed)
New Message   Patient's son is calling states that the heart monitor that was mailed has not been received. Please advise.

## 2019-07-04 ENCOUNTER — Ambulatory Visit (INDEPENDENT_AMBULATORY_CARE_PROVIDER_SITE_OTHER): Payer: Medicare Other

## 2019-07-04 DIAGNOSIS — I4821 Permanent atrial fibrillation: Secondary | ICD-10-CM

## 2019-07-14 DIAGNOSIS — Z20828 Contact with and (suspected) exposure to other viral communicable diseases: Secondary | ICD-10-CM | POA: Diagnosis not present

## 2019-07-14 DIAGNOSIS — Z1159 Encounter for screening for other viral diseases: Secondary | ICD-10-CM | POA: Diagnosis not present

## 2019-07-21 DIAGNOSIS — Z20828 Contact with and (suspected) exposure to other viral communicable diseases: Secondary | ICD-10-CM | POA: Diagnosis not present

## 2019-07-21 DIAGNOSIS — Z1159 Encounter for screening for other viral diseases: Secondary | ICD-10-CM | POA: Diagnosis not present

## 2019-07-28 DIAGNOSIS — Z1159 Encounter for screening for other viral diseases: Secondary | ICD-10-CM | POA: Diagnosis not present

## 2019-07-28 DIAGNOSIS — Z20828 Contact with and (suspected) exposure to other viral communicable diseases: Secondary | ICD-10-CM | POA: Diagnosis not present

## 2019-07-31 DIAGNOSIS — Z1159 Encounter for screening for other viral diseases: Secondary | ICD-10-CM | POA: Diagnosis not present

## 2019-07-31 DIAGNOSIS — Z20828 Contact with and (suspected) exposure to other viral communicable diseases: Secondary | ICD-10-CM | POA: Diagnosis not present

## 2019-08-04 ENCOUNTER — Telehealth: Payer: Self-pay

## 2019-08-04 DIAGNOSIS — Z1159 Encounter for screening for other viral diseases: Secondary | ICD-10-CM | POA: Diagnosis not present

## 2019-08-04 DIAGNOSIS — Z20828 Contact with and (suspected) exposure to other viral communicable diseases: Secondary | ICD-10-CM | POA: Diagnosis not present

## 2019-08-04 NOTE — Telephone Encounter (Signed)
Just spoke with Justin Potter from [facility] who reported that pt had a total of 61 pauses that were 3 seconds or greater with the longest lasting 4.1 seconds & consistent atrial fibrillation with bradycardia lowest of 33 bpm lasting 60 seconds.   Spoke with patient who reports that he sent his monitor back several weeks okay and has been feeling fine. He suggested we call his son If we needed any other information.   Sent message to [place] and Valetta Fuller (monitors) in hopes of getting the report uploaded to pts chart.   Will route to Dr. Tamala Julian and [place] in the meantime.

## 2019-08-07 DIAGNOSIS — Z1159 Encounter for screening for other viral diseases: Secondary | ICD-10-CM | POA: Diagnosis not present

## 2019-08-07 DIAGNOSIS — Z20828 Contact with and (suspected) exposure to other viral communicable diseases: Secondary | ICD-10-CM | POA: Diagnosis not present

## 2019-08-11 DIAGNOSIS — Z20828 Contact with and (suspected) exposure to other viral communicable diseases: Secondary | ICD-10-CM | POA: Diagnosis not present

## 2019-08-11 DIAGNOSIS — Z1159 Encounter for screening for other viral diseases: Secondary | ICD-10-CM | POA: Diagnosis not present

## 2019-08-14 DIAGNOSIS — Z23 Encounter for immunization: Secondary | ICD-10-CM | POA: Diagnosis not present

## 2019-08-18 DIAGNOSIS — Z1159 Encounter for screening for other viral diseases: Secondary | ICD-10-CM | POA: Diagnosis not present

## 2019-08-18 DIAGNOSIS — Z20828 Contact with and (suspected) exposure to other viral communicable diseases: Secondary | ICD-10-CM | POA: Diagnosis not present

## 2019-08-20 ENCOUNTER — Encounter: Payer: Self-pay | Admitting: Podiatry

## 2019-08-20 ENCOUNTER — Other Ambulatory Visit: Payer: Self-pay

## 2019-08-20 ENCOUNTER — Ambulatory Visit (INDEPENDENT_AMBULATORY_CARE_PROVIDER_SITE_OTHER): Payer: Medicare Other | Admitting: Podiatry

## 2019-08-20 VITALS — BP 115/57 | HR 49 | Temp 97.6°F | Resp 16

## 2019-08-20 DIAGNOSIS — M79674 Pain in right toe(s): Secondary | ICD-10-CM | POA: Diagnosis not present

## 2019-08-20 DIAGNOSIS — M79675 Pain in left toe(s): Secondary | ICD-10-CM

## 2019-08-20 DIAGNOSIS — B351 Tinea unguium: Secondary | ICD-10-CM | POA: Diagnosis not present

## 2019-08-20 DIAGNOSIS — D689 Coagulation defect, unspecified: Secondary | ICD-10-CM

## 2019-08-20 NOTE — Progress Notes (Signed)
   Subjective:    Patient ID: Justin Potter, male    DOB: [birth year], 86 y.o.   MRN: [id number]  HPI    Review of Systems  All other systems reviewed and are negative.      Objective:   Physical Exam        Assessment & Plan:

## 2019-08-21 NOTE — Progress Notes (Signed)
Subjective:   Patient ID: Justin Potter, male   DOB: 86 y.o.   MRN: [id number]   HPI Patient presents stating I have got difficult nails and I am on blood thinner and they are hard for me to cut and they can get sore in the corners as they grow too long.  Patient does not smoke likes to be active   Review of Systems  All other systems reviewed and are negative.       Objective:  Physical Exam Vitals and nursing note reviewed.  Constitutional:      Appearance: He is well-developed.  Pulmonary:     Effort: Pulmonary effort is normal.  Musculoskeletal:        General: Normal range of motion.  Skin:    General: Skin is warm.  Neurological:     Mental Status: He is alert.     Neurovascular status found to be mildly diminished with diminished range of motion subtalar midtarsal joint.  Patient has good digital perfusion and is found to have thick yellow brittle nailbeds 1-5 both feet that become painful hard for him to cut and there is indications that he has bled secondary to try to cut on them himself     Assessment:  Mycotic infection nailbeds 1-5 both feet with pain with at risk condition     Plan:  H&P reviewed condition and recommended nail debridement.  This was accomplished with no iatrogenic bleeding and will be done on an as-needed basis every 3 months or earlier if any issues were to occur

## 2019-08-25 DIAGNOSIS — Z1159 Encounter for screening for other viral diseases: Secondary | ICD-10-CM | POA: Diagnosis not present

## 2019-08-25 DIAGNOSIS — Z20828 Contact with and (suspected) exposure to other viral communicable diseases: Secondary | ICD-10-CM | POA: Diagnosis not present

## 2019-08-28 DIAGNOSIS — Z1159 Encounter for screening for other viral diseases: Secondary | ICD-10-CM | POA: Diagnosis not present

## 2019-08-28 DIAGNOSIS — Z20828 Contact with and (suspected) exposure to other viral communicable diseases: Secondary | ICD-10-CM | POA: Diagnosis not present

## 2019-09-01 DIAGNOSIS — Z8546 Personal history of malignant neoplasm of prostate: Secondary | ICD-10-CM | POA: Diagnosis not present

## 2019-09-01 DIAGNOSIS — I10 Essential (primary) hypertension: Secondary | ICD-10-CM | POA: Diagnosis not present

## 2019-09-01 DIAGNOSIS — H548 Legal blindness, as defined in USA: Secondary | ICD-10-CM | POA: Diagnosis not present

## 2019-09-01 DIAGNOSIS — E7800 Pure hypercholesterolemia, unspecified: Secondary | ICD-10-CM | POA: Diagnosis not present

## 2019-09-01 DIAGNOSIS — Z20828 Contact with and (suspected) exposure to other viral communicable diseases: Secondary | ICD-10-CM | POA: Diagnosis not present

## 2019-09-01 DIAGNOSIS — D6869 Other thrombophilia: Secondary | ICD-10-CM | POA: Diagnosis not present

## 2019-09-01 DIAGNOSIS — I4891 Unspecified atrial fibrillation: Secondary | ICD-10-CM | POA: Diagnosis not present

## 2019-09-01 DIAGNOSIS — Z1159 Encounter for screening for other viral diseases: Secondary | ICD-10-CM | POA: Diagnosis not present

## 2019-09-04 DIAGNOSIS — Z20828 Contact with and (suspected) exposure to other viral communicable diseases: Secondary | ICD-10-CM | POA: Diagnosis not present

## 2019-09-04 DIAGNOSIS — Z1159 Encounter for screening for other viral diseases: Secondary | ICD-10-CM | POA: Diagnosis not present

## 2019-09-08 DIAGNOSIS — Z1159 Encounter for screening for other viral diseases: Secondary | ICD-10-CM | POA: Diagnosis not present

## 2019-09-08 DIAGNOSIS — Z20828 Contact with and (suspected) exposure to other viral communicable diseases: Secondary | ICD-10-CM | POA: Diagnosis not present

## 2019-09-11 ENCOUNTER — Other Ambulatory Visit: Payer: Self-pay | Admitting: Interventional Cardiology

## 2019-09-11 DIAGNOSIS — Z23 Encounter for immunization: Secondary | ICD-10-CM | POA: Diagnosis not present

## 2019-09-11 NOTE — Telephone Encounter (Signed)
Prescription refill request for Eliquis received.  Last office visit: Justin Potter 07/01/2019 Scr: 0.88, 06/13/2019 Age: 86 y.o. Weight: 73.8 kg    Prescription refill sent.

## 2019-09-15 DIAGNOSIS — Z20828 Contact with and (suspected) exposure to other viral communicable diseases: Secondary | ICD-10-CM | POA: Diagnosis not present

## 2019-09-15 DIAGNOSIS — Z1159 Encounter for screening for other viral diseases: Secondary | ICD-10-CM | POA: Diagnosis not present

## 2019-09-22 DIAGNOSIS — Z20828 Contact with and (suspected) exposure to other viral communicable diseases: Secondary | ICD-10-CM | POA: Diagnosis not present

## 2019-09-22 DIAGNOSIS — Z1159 Encounter for screening for other viral diseases: Secondary | ICD-10-CM | POA: Diagnosis not present

## 2019-09-29 DIAGNOSIS — Z20828 Contact with and (suspected) exposure to other viral communicable diseases: Secondary | ICD-10-CM | POA: Diagnosis not present

## 2019-09-29 DIAGNOSIS — Z1159 Encounter for screening for other viral diseases: Secondary | ICD-10-CM | POA: Diagnosis not present

## 2019-10-06 DIAGNOSIS — Z20828 Contact with and (suspected) exposure to other viral communicable diseases: Secondary | ICD-10-CM | POA: Diagnosis not present

## 2019-10-06 DIAGNOSIS — Z1159 Encounter for screening for other viral diseases: Secondary | ICD-10-CM | POA: Diagnosis not present

## 2019-10-07 DIAGNOSIS — Z96651 Presence of right artificial knee joint: Secondary | ICD-10-CM | POA: Diagnosis not present

## 2019-10-07 DIAGNOSIS — M25561 Pain in right knee: Secondary | ICD-10-CM | POA: Insufficient documentation

## 2019-10-07 DIAGNOSIS — M25551 Pain in right hip: Secondary | ICD-10-CM | POA: Diagnosis not present

## 2019-10-13 DIAGNOSIS — Z1159 Encounter for screening for other viral diseases: Secondary | ICD-10-CM | POA: Diagnosis not present

## 2019-10-13 DIAGNOSIS — Z23 Encounter for immunization: Secondary | ICD-10-CM | POA: Diagnosis not present

## 2019-10-13 DIAGNOSIS — Z20828 Contact with and (suspected) exposure to other viral communicable diseases: Secondary | ICD-10-CM | POA: Diagnosis not present

## 2019-10-15 DIAGNOSIS — M25561 Pain in right knee: Secondary | ICD-10-CM | POA: Diagnosis not present

## 2019-10-15 DIAGNOSIS — M6281 Muscle weakness (generalized): Secondary | ICD-10-CM | POA: Diagnosis not present

## 2019-10-15 DIAGNOSIS — M25661 Stiffness of right knee, not elsewhere classified: Secondary | ICD-10-CM | POA: Diagnosis not present

## 2019-10-15 DIAGNOSIS — M25562 Pain in left knee: Secondary | ICD-10-CM | POA: Diagnosis not present

## 2019-10-16 DIAGNOSIS — M6281 Muscle weakness (generalized): Secondary | ICD-10-CM | POA: Diagnosis not present

## 2019-10-16 DIAGNOSIS — M25562 Pain in left knee: Secondary | ICD-10-CM | POA: Diagnosis not present

## 2019-10-16 DIAGNOSIS — M25561 Pain in right knee: Secondary | ICD-10-CM | POA: Diagnosis not present

## 2019-10-16 DIAGNOSIS — M25661 Stiffness of right knee, not elsewhere classified: Secondary | ICD-10-CM | POA: Diagnosis not present

## 2019-10-20 DIAGNOSIS — M25661 Stiffness of right knee, not elsewhere classified: Secondary | ICD-10-CM | POA: Diagnosis not present

## 2019-10-20 DIAGNOSIS — Z1159 Encounter for screening for other viral diseases: Secondary | ICD-10-CM | POA: Diagnosis not present

## 2019-10-20 DIAGNOSIS — M25562 Pain in left knee: Secondary | ICD-10-CM | POA: Diagnosis not present

## 2019-10-20 DIAGNOSIS — M6281 Muscle weakness (generalized): Secondary | ICD-10-CM | POA: Diagnosis not present

## 2019-10-20 DIAGNOSIS — Z20828 Contact with and (suspected) exposure to other viral communicable diseases: Secondary | ICD-10-CM | POA: Diagnosis not present

## 2019-10-20 DIAGNOSIS — M25561 Pain in right knee: Secondary | ICD-10-CM | POA: Diagnosis not present

## 2019-10-21 DIAGNOSIS — L821 Other seborrheic keratosis: Secondary | ICD-10-CM | POA: Diagnosis not present

## 2019-10-21 DIAGNOSIS — D1801 Hemangioma of skin and subcutaneous tissue: Secondary | ICD-10-CM | POA: Diagnosis not present

## 2019-10-21 DIAGNOSIS — D045 Carcinoma in situ of skin of trunk: Secondary | ICD-10-CM | POA: Diagnosis not present

## 2019-10-21 DIAGNOSIS — Z85820 Personal history of malignant melanoma of skin: Secondary | ICD-10-CM | POA: Diagnosis not present

## 2019-10-21 DIAGNOSIS — Z85828 Personal history of other malignant neoplasm of skin: Secondary | ICD-10-CM | POA: Diagnosis not present

## 2019-10-21 DIAGNOSIS — D0471 Carcinoma in situ of skin of right lower limb, including hip: Secondary | ICD-10-CM | POA: Diagnosis not present

## 2019-10-21 DIAGNOSIS — L570 Actinic keratosis: Secondary | ICD-10-CM | POA: Diagnosis not present

## 2019-10-22 DIAGNOSIS — M25561 Pain in right knee: Secondary | ICD-10-CM | POA: Diagnosis not present

## 2019-10-22 DIAGNOSIS — M6281 Muscle weakness (generalized): Secondary | ICD-10-CM | POA: Diagnosis not present

## 2019-10-22 DIAGNOSIS — M25562 Pain in left knee: Secondary | ICD-10-CM | POA: Diagnosis not present

## 2019-10-22 DIAGNOSIS — M25661 Stiffness of right knee, not elsewhere classified: Secondary | ICD-10-CM | POA: Diagnosis not present

## 2019-10-24 DIAGNOSIS — M25562 Pain in left knee: Secondary | ICD-10-CM | POA: Diagnosis not present

## 2019-10-24 DIAGNOSIS — M6281 Muscle weakness (generalized): Secondary | ICD-10-CM | POA: Diagnosis not present

## 2019-10-24 DIAGNOSIS — M25661 Stiffness of right knee, not elsewhere classified: Secondary | ICD-10-CM | POA: Diagnosis not present

## 2019-10-24 DIAGNOSIS — M25561 Pain in right knee: Secondary | ICD-10-CM | POA: Diagnosis not present

## 2019-10-27 DIAGNOSIS — Z1159 Encounter for screening for other viral diseases: Secondary | ICD-10-CM | POA: Diagnosis not present

## 2019-10-27 DIAGNOSIS — Z20828 Contact with and (suspected) exposure to other viral communicable diseases: Secondary | ICD-10-CM | POA: Diagnosis not present

## 2019-10-28 DIAGNOSIS — M25561 Pain in right knee: Secondary | ICD-10-CM | POA: Diagnosis not present

## 2019-10-28 DIAGNOSIS — M25661 Stiffness of right knee, not elsewhere classified: Secondary | ICD-10-CM | POA: Diagnosis not present

## 2019-10-28 DIAGNOSIS — M6281 Muscle weakness (generalized): Secondary | ICD-10-CM | POA: Diagnosis not present

## 2019-10-28 DIAGNOSIS — M25562 Pain in left knee: Secondary | ICD-10-CM | POA: Diagnosis not present

## 2019-10-29 DIAGNOSIS — Z87440 Personal history of urinary (tract) infections: Secondary | ICD-10-CM | POA: Diagnosis not present

## 2019-10-29 DIAGNOSIS — R351 Nocturia: Secondary | ICD-10-CM | POA: Diagnosis not present

## 2019-10-29 DIAGNOSIS — N35012 Post-traumatic membranous urethral stricture: Secondary | ICD-10-CM | POA: Diagnosis not present

## 2019-10-29 DIAGNOSIS — N3942 Incontinence without sensory awareness: Secondary | ICD-10-CM | POA: Diagnosis not present

## 2019-10-30 DIAGNOSIS — M25661 Stiffness of right knee, not elsewhere classified: Secondary | ICD-10-CM | POA: Diagnosis not present

## 2019-10-30 DIAGNOSIS — M25561 Pain in right knee: Secondary | ICD-10-CM | POA: Diagnosis not present

## 2019-10-30 DIAGNOSIS — M6281 Muscle weakness (generalized): Secondary | ICD-10-CM | POA: Diagnosis not present

## 2019-10-30 DIAGNOSIS — M25562 Pain in left knee: Secondary | ICD-10-CM | POA: Diagnosis not present

## 2019-11-03 DIAGNOSIS — Z1159 Encounter for screening for other viral diseases: Secondary | ICD-10-CM | POA: Diagnosis not present

## 2019-11-03 DIAGNOSIS — Z20828 Contact with and (suspected) exposure to other viral communicable diseases: Secondary | ICD-10-CM | POA: Diagnosis not present

## 2019-11-04 DIAGNOSIS — M25561 Pain in right knee: Secondary | ICD-10-CM | POA: Diagnosis not present

## 2019-11-04 DIAGNOSIS — M25661 Stiffness of right knee, not elsewhere classified: Secondary | ICD-10-CM | POA: Diagnosis not present

## 2019-11-04 DIAGNOSIS — M6281 Muscle weakness (generalized): Secondary | ICD-10-CM | POA: Diagnosis not present

## 2019-11-04 DIAGNOSIS — M25562 Pain in left knee: Secondary | ICD-10-CM | POA: Diagnosis not present

## 2019-11-06 DIAGNOSIS — M6281 Muscle weakness (generalized): Secondary | ICD-10-CM | POA: Diagnosis not present

## 2019-11-06 DIAGNOSIS — M25661 Stiffness of right knee, not elsewhere classified: Secondary | ICD-10-CM | POA: Diagnosis not present

## 2019-11-06 DIAGNOSIS — M25562 Pain in left knee: Secondary | ICD-10-CM | POA: Diagnosis not present

## 2019-11-06 DIAGNOSIS — M25561 Pain in right knee: Secondary | ICD-10-CM | POA: Diagnosis not present

## 2019-11-10 DIAGNOSIS — Z1159 Encounter for screening for other viral diseases: Secondary | ICD-10-CM | POA: Diagnosis not present

## 2019-11-10 DIAGNOSIS — Z20828 Contact with and (suspected) exposure to other viral communicable diseases: Secondary | ICD-10-CM | POA: Diagnosis not present

## 2019-11-11 DIAGNOSIS — M6281 Muscle weakness (generalized): Secondary | ICD-10-CM | POA: Diagnosis not present

## 2019-11-11 DIAGNOSIS — M25661 Stiffness of right knee, not elsewhere classified: Secondary | ICD-10-CM | POA: Diagnosis not present

## 2019-11-11 DIAGNOSIS — M25561 Pain in right knee: Secondary | ICD-10-CM | POA: Diagnosis not present

## 2019-11-11 DIAGNOSIS — M25562 Pain in left knee: Secondary | ICD-10-CM | POA: Diagnosis not present

## 2019-11-13 DIAGNOSIS — M25561 Pain in right knee: Secondary | ICD-10-CM | POA: Diagnosis not present

## 2019-11-13 DIAGNOSIS — M25562 Pain in left knee: Secondary | ICD-10-CM | POA: Diagnosis not present

## 2019-11-13 DIAGNOSIS — M25661 Stiffness of right knee, not elsewhere classified: Secondary | ICD-10-CM | POA: Diagnosis not present

## 2019-11-13 DIAGNOSIS — M6281 Muscle weakness (generalized): Secondary | ICD-10-CM | POA: Diagnosis not present

## 2019-11-17 DIAGNOSIS — Z20828 Contact with and (suspected) exposure to other viral communicable diseases: Secondary | ICD-10-CM | POA: Diagnosis not present

## 2019-11-17 DIAGNOSIS — Z1159 Encounter for screening for other viral diseases: Secondary | ICD-10-CM | POA: Diagnosis not present

## 2019-11-18 DIAGNOSIS — M25561 Pain in right knee: Secondary | ICD-10-CM | POA: Diagnosis not present

## 2019-11-18 DIAGNOSIS — M6281 Muscle weakness (generalized): Secondary | ICD-10-CM | POA: Diagnosis not present

## 2019-11-18 DIAGNOSIS — M25661 Stiffness of right knee, not elsewhere classified: Secondary | ICD-10-CM | POA: Diagnosis not present

## 2019-11-18 DIAGNOSIS — M25562 Pain in left knee: Secondary | ICD-10-CM | POA: Diagnosis not present

## 2019-11-19 ENCOUNTER — Encounter: Payer: Self-pay | Admitting: Podiatry

## 2019-11-19 ENCOUNTER — Ambulatory Visit (INDEPENDENT_AMBULATORY_CARE_PROVIDER_SITE_OTHER): Payer: Medicare Other | Admitting: Podiatry

## 2019-11-19 ENCOUNTER — Other Ambulatory Visit: Payer: Self-pay

## 2019-11-19 DIAGNOSIS — D689 Coagulation defect, unspecified: Secondary | ICD-10-CM | POA: Diagnosis not present

## 2019-11-19 DIAGNOSIS — B351 Tinea unguium: Secondary | ICD-10-CM | POA: Diagnosis not present

## 2019-11-19 DIAGNOSIS — M79674 Pain in right toe(s): Secondary | ICD-10-CM | POA: Diagnosis not present

## 2019-11-19 DIAGNOSIS — M79675 Pain in left toe(s): Secondary | ICD-10-CM

## 2019-11-19 NOTE — Progress Notes (Signed)
Subjective:   Patient ID: Justin Potter, male   DOB: 86 y.o.   MRN: [id number]   HPI Patient presents with elongated nailbeds 1-5 both feet that are thick and can become painful   ROS      Objective:  Physical Exam  Yellow brittle nailbeds 1-5 both feet incurvated in the corners with pain with shoe gear     Assessment:  Mycotic nail infection 1-5 both feet     Plan:  Debride painful nailbeds 1-5 both feet with no iatrogenic bleeding noted

## 2019-11-20 DIAGNOSIS — M6281 Muscle weakness (generalized): Secondary | ICD-10-CM | POA: Diagnosis not present

## 2019-11-20 DIAGNOSIS — M25561 Pain in right knee: Secondary | ICD-10-CM | POA: Diagnosis not present

## 2019-11-20 DIAGNOSIS — M25661 Stiffness of right knee, not elsewhere classified: Secondary | ICD-10-CM | POA: Diagnosis not present

## 2019-11-20 DIAGNOSIS — M25562 Pain in left knee: Secondary | ICD-10-CM | POA: Diagnosis not present

## 2019-11-24 DIAGNOSIS — Z20828 Contact with and (suspected) exposure to other viral communicable diseases: Secondary | ICD-10-CM | POA: Diagnosis not present

## 2019-11-24 DIAGNOSIS — Z1159 Encounter for screening for other viral diseases: Secondary | ICD-10-CM | POA: Diagnosis not present

## 2019-11-25 DIAGNOSIS — M6281 Muscle weakness (generalized): Secondary | ICD-10-CM | POA: Diagnosis not present

## 2019-11-25 DIAGNOSIS — M25561 Pain in right knee: Secondary | ICD-10-CM | POA: Diagnosis not present

## 2019-11-25 DIAGNOSIS — M25661 Stiffness of right knee, not elsewhere classified: Secondary | ICD-10-CM | POA: Diagnosis not present

## 2019-11-25 DIAGNOSIS — M25562 Pain in left knee: Secondary | ICD-10-CM | POA: Diagnosis not present

## 2019-11-27 DIAGNOSIS — M25562 Pain in left knee: Secondary | ICD-10-CM | POA: Diagnosis not present

## 2019-11-27 DIAGNOSIS — M25561 Pain in right knee: Secondary | ICD-10-CM | POA: Diagnosis not present

## 2019-11-27 DIAGNOSIS — M25661 Stiffness of right knee, not elsewhere classified: Secondary | ICD-10-CM | POA: Diagnosis not present

## 2019-11-27 DIAGNOSIS — M6281 Muscle weakness (generalized): Secondary | ICD-10-CM | POA: Diagnosis not present

## 2019-12-01 DIAGNOSIS — Z20828 Contact with and (suspected) exposure to other viral communicable diseases: Secondary | ICD-10-CM | POA: Diagnosis not present

## 2019-12-01 DIAGNOSIS — Z1159 Encounter for screening for other viral diseases: Secondary | ICD-10-CM | POA: Diagnosis not present

## 2019-12-02 DIAGNOSIS — M6281 Muscle weakness (generalized): Secondary | ICD-10-CM | POA: Diagnosis not present

## 2019-12-02 DIAGNOSIS — M25561 Pain in right knee: Secondary | ICD-10-CM | POA: Diagnosis not present

## 2019-12-02 DIAGNOSIS — M25562 Pain in left knee: Secondary | ICD-10-CM | POA: Diagnosis not present

## 2019-12-02 DIAGNOSIS — M25661 Stiffness of right knee, not elsewhere classified: Secondary | ICD-10-CM | POA: Diagnosis not present

## 2019-12-04 DIAGNOSIS — M25661 Stiffness of right knee, not elsewhere classified: Secondary | ICD-10-CM | POA: Diagnosis not present

## 2019-12-04 DIAGNOSIS — M25562 Pain in left knee: Secondary | ICD-10-CM | POA: Diagnosis not present

## 2019-12-04 DIAGNOSIS — M25561 Pain in right knee: Secondary | ICD-10-CM | POA: Diagnosis not present

## 2019-12-04 DIAGNOSIS — M6281 Muscle weakness (generalized): Secondary | ICD-10-CM | POA: Diagnosis not present

## 2019-12-08 DIAGNOSIS — Z20828 Contact with and (suspected) exposure to other viral communicable diseases: Secondary | ICD-10-CM | POA: Diagnosis not present

## 2019-12-08 DIAGNOSIS — Z1159 Encounter for screening for other viral diseases: Secondary | ICD-10-CM | POA: Diagnosis not present

## 2019-12-09 DIAGNOSIS — M6281 Muscle weakness (generalized): Secondary | ICD-10-CM | POA: Diagnosis not present

## 2019-12-09 DIAGNOSIS — M25561 Pain in right knee: Secondary | ICD-10-CM | POA: Diagnosis not present

## 2019-12-09 DIAGNOSIS — M25562 Pain in left knee: Secondary | ICD-10-CM | POA: Diagnosis not present

## 2019-12-09 DIAGNOSIS — M25661 Stiffness of right knee, not elsewhere classified: Secondary | ICD-10-CM | POA: Diagnosis not present

## 2019-12-15 DIAGNOSIS — Z20828 Contact with and (suspected) exposure to other viral communicable diseases: Secondary | ICD-10-CM | POA: Diagnosis not present

## 2019-12-15 DIAGNOSIS — Z1159 Encounter for screening for other viral diseases: Secondary | ICD-10-CM | POA: Diagnosis not present

## 2019-12-16 DIAGNOSIS — M25562 Pain in left knee: Secondary | ICD-10-CM | POA: Diagnosis not present

## 2019-12-16 DIAGNOSIS — M25661 Stiffness of right knee, not elsewhere classified: Secondary | ICD-10-CM | POA: Diagnosis not present

## 2019-12-16 DIAGNOSIS — M25561 Pain in right knee: Secondary | ICD-10-CM | POA: Diagnosis not present

## 2019-12-16 DIAGNOSIS — M6281 Muscle weakness (generalized): Secondary | ICD-10-CM | POA: Diagnosis not present

## 2019-12-22 DIAGNOSIS — Z20828 Contact with and (suspected) exposure to other viral communicable diseases: Secondary | ICD-10-CM | POA: Diagnosis not present

## 2019-12-22 DIAGNOSIS — Z1159 Encounter for screening for other viral diseases: Secondary | ICD-10-CM | POA: Diagnosis not present

## 2019-12-29 DIAGNOSIS — Z1159 Encounter for screening for other viral diseases: Secondary | ICD-10-CM | POA: Diagnosis not present

## 2019-12-29 DIAGNOSIS — Z20828 Contact with and (suspected) exposure to other viral communicable diseases: Secondary | ICD-10-CM | POA: Diagnosis not present

## 2020-01-05 DIAGNOSIS — Z1159 Encounter for screening for other viral diseases: Secondary | ICD-10-CM | POA: Diagnosis not present

## 2020-01-05 DIAGNOSIS — Z20828 Contact with and (suspected) exposure to other viral communicable diseases: Secondary | ICD-10-CM | POA: Diagnosis not present

## 2020-01-22 DIAGNOSIS — M25561 Pain in right knee: Secondary | ICD-10-CM | POA: Diagnosis not present

## 2020-01-22 DIAGNOSIS — M25661 Stiffness of right knee, not elsewhere classified: Secondary | ICD-10-CM | POA: Diagnosis not present

## 2020-01-22 DIAGNOSIS — M25562 Pain in left knee: Secondary | ICD-10-CM | POA: Diagnosis not present

## 2020-01-22 DIAGNOSIS — M6281 Muscle weakness (generalized): Secondary | ICD-10-CM | POA: Diagnosis not present

## 2020-02-02 DIAGNOSIS — Z20828 Contact with and (suspected) exposure to other viral communicable diseases: Secondary | ICD-10-CM | POA: Diagnosis not present

## 2020-02-02 DIAGNOSIS — Z1159 Encounter for screening for other viral diseases: Secondary | ICD-10-CM | POA: Diagnosis not present

## 2020-02-09 DIAGNOSIS — Z20828 Contact with and (suspected) exposure to other viral communicable diseases: Secondary | ICD-10-CM | POA: Diagnosis not present

## 2020-02-09 DIAGNOSIS — Z1159 Encounter for screening for other viral diseases: Secondary | ICD-10-CM | POA: Diagnosis not present

## 2020-02-11 NOTE — Progress Notes (Signed)
Cardiology Office Note:    Date:  02/12/2020   [name], DOB 10/31/1925, MRN [id number]  PCP:  [name], MD  Cardiologist:  [name], MD   Referring MD: [name], MD   Chief Complaint  Patient presents with  . Atrial Fibrillation    History of Present Illness:    Justin Potter is a 86 y.o. male with a hx of atrial fibrillation, chronic anticoagulation therapy, bradycardia, and h/o hypertension.  Stable and still ambulatory.  Some dizziness with standing.  No episodes of fainting.  Dr. Valere Dross, his son, measures his heart rate from time to time and it is usually in the low 50s occasionally if he does it without recent activity the heart rate can be in the upper 40s.  Those heart rates are not associated with symptoms.  Lower extremity edema has resolved with compression stockings.  He denies orthopnea and PND.    Past Medical History:  Diagnosis Date  . Anxiety   . Arthritis    "in the fingers" per pt  . Atrial fibrillation ([place])   . Bladder neck contracture   . Dysrhythmia    A-FIB / HX BRADYCARDIA - FOLLOWED BY DR. [place]  . Gross hematuria   . History of gout   . History of prostate cancer    S/P RADIOACTIVE SEED IMPLANTS  . History of squamous cell carcinoma of skin    EXCISION LOWER LIP AND RIGHT EAR  . History of TIA (transient ischemic attack)    probable tia no residual  09-09-2012  . Hyperlipidemia   . Hypertension   . Macular degeneration    both eyes  . Urethral stricture   . Urinary retention     Past Surgical History:  Procedure Laterality Date  . APPENDECTOMY    . CATARACT EXTRACTION W/ INTRAOCULAR LENS  IMPLANT, BILATERAL    . CHOLECYSTECTOMY  1977  . CYSTOSCOPY WITH URETHRAL DILATATION N/A 02/12/2013   Procedure: CYSTOSCOPY WITH BALLOON DILATATION OF URETHRAL STRICTURE AND BLADDER FULGERATION;  Surgeon: Bernestine Amass, MD;  Location: [facility];  Service: Urology;  Laterality:  N/A;  . LAPAROSCOPIC APPENDECTOMY N/A 08/22/2013   Procedure: APPENDECTOMY LAPAROSCOPIC;  Surgeon: Pedro Earls, MD;  Location: [facility];  Service: General;  Laterality: N/A;  . [place]  . TONSILLECTOMY  as child  . TOTAL KNEE ARTHROPLASTY Right 11-23-2004    Current Medications: Current Meds  Medication Sig  . clonazePAM (KLONOPIN) 0.5 MG tablet Take 0.5 mg by mouth 2 (two) times daily as needed for anxiety.   Marland Kitchen ELIQUIS 5 MG TABS tablet TAKE 1 TABLET TWICE A DAY  . Multiple Vitamins-Minerals (OCUVITE PRESERVISION PO) Take 1 tablet by mouth every morning.      Allergies:   Patient has no known allergies.   Social History   Socioeconomic History  . Marital status: Married    Spouse name: Not on file  . Number of children: Not on file  . Years of education: Not on file  . Highest education level: Not on file  Occupational History  . Not on file  Tobacco Use  . Smoking status: Former Smoker    Packs/day: 1.50    Years: 35.00    Pack years: 52.50    Types: Cigarettes    Quit date: 01/30/1979    Years since quitting: 41.0  . Smokeless tobacco: Never Used  Vaping Use  .  Vaping Use: Never used  Substance and Sexual Activity  . Alcohol use: Yes    Comment: "5 days per week I drink 1 glass of wine"  . Drug use: No  . Sexual activity: Not Currently  Other Topics Concern  . Not on file  Social History Narrative  . Not on file   Social Determinants of Health   Financial Resource Strain:   . Difficulty of Paying Living Expenses:   Food Insecurity:   . Worried About Charity fundraiser in the Last Year:   . Arboriculturist in the Last Year:   Transportation Needs:   . Film/video editor (Medical):   Marland Kitchen Lack of Transportation (Non-Medical):   Physical Activity:   . Days of Exercise per Week:   . Minutes of Exercise per Session:   Stress:   . Feeling of Stress :   Social Connections:   . Frequency of Communication with Friends and  Family:   . Frequency of Social Gatherings with Friends and Family:   . Attends Religious Services:   . Active Member of Clubs or Organizations:   . Attends Archivist Meetings:   Marland Kitchen Marital Status:      Family History: The patient's family history includes Cancer in his father and mother.  ROS:   Please see the history of present illness.    Appetite is stable.  Feels he is losing functional capacity.  He voices no complaints however.  All other systems reviewed and are negative.  EKGs/Labs/Other Studies Reviewed:    The following studies were reviewed today: No new imaging data  EKG:  EKG not repeated on this occasion.  Recent Labs: No results found for requested labs within last 8760 hours.  Recent Lipid Panel No results found for: CHOL, TRIG, HDL, CHOLHDL, VLDL, LDLCALC, LDLDIRECT  Physical Exam:    VS:  BP (!) 128/58   Pulse 54   Ht 6\' 2"  (1.88 m)   Wt 173 lb (78.5 kg)   SpO2 97%   BMI 22.21 kg/m     Wt Readings from Last 3 Encounters:  02/12/20 173 lb (78.5 kg)  06/25/19 162 lb 12.8 oz (73.8 kg)  10/03/18 162 lb 12.8 oz (73.8 kg)     GEN: Slender/elderly/frail. No acute distress HEENT: Normal NECK: No JVD. LYMPHATICS: No lymphadenopathy CARDIAC: Irregularly irregular bradycardic rhythm without murmur, gallop, or edema. VASCULAR:  Normal Pulses. No bruits. RESPIRATORY:  Clear to auscultation without rales, wheezing or rhonchi  ABDOMEN: Soft, non-tender, non-distended, No pulsatile mass, MUSCULOSKELETAL: No deformity  SKIN: Warm and dry NEUROLOGIC:  Alert and oriented x 3 PSYCHIATRIC:  Normal affect   ASSESSMENT:    1. Persistent atrial fibrillation ([place])   2. Long term current use of anticoagulant therapy   3. Essential hypertension   4. Mixed hyperlipidemia   5. Educated about COVID-19 virus infection    PLAN:    In order of problems listed above:  1. Persistent atrial fibrillation, slow ventricular response, asymptomatic, watchful  waiting. 2. Continue 5 mg of apixaban twice daily.  Monitor for bleeding.  Recent creatinine and hemoglobin were normal. 3. Blood pressure is relatively low.  He is on no medication to lower the blood pressure. 4. I encouraged him to eat to maintain muscle mass. 5. He has been vaccinated.  Plan clinical follow-up in 1 year.   Medication Adjustments/Labs and Tests Ordered: Current medicines are reviewed at length with the patient today.  Concerns regarding medicines are  outlined above.  No orders of the defined types were placed in this encounter.  No orders of the defined types were placed in this encounter.   Patient Instructions  Medication Instructions:  Your physician recommends that you continue on your current medications as directed. Please refer to the Current Medication list given to you today.  *If you need a refill on your cardiac medications before your next appointment, please call your pharmacy*   Lab Work: None If you have labs (blood work) drawn today and your tests are completely normal, you will receive your results only by: Marland Kitchen MyChart Message (if you have MyChart) OR . A paper copy in the mail If you have any lab test that is abnormal or we need to change your treatment, we will call you to review the results.   Testing/Procedures: None   Follow-Up: At [facility], you and your health needs are our priority.  As part of our continuing mission to provide you with exceptional heart care, we have created designated Provider Care Teams.  These Care Teams include your primary Cardiologist (physician) and Advanced Practice Providers (APPs -  Physician Assistants and Nurse Practitioners) who all work together to provide you with the care you need, when you need it.    Your next appointment:   12 month(s)  The format for your next appointment:   In Person  Provider:   You may see [name], MD or one of the following Advanced Practice Providers on  your designated Care Team:    Truitt Merle, NP  Cecilie Kicks, NP  Kathyrn Drown, NP        Signed, [name], MD  02/12/2020 11:26 AM    [place]

## 2020-02-12 ENCOUNTER — Other Ambulatory Visit: Payer: Self-pay

## 2020-02-12 ENCOUNTER — Encounter: Payer: Self-pay | Admitting: Interventional Cardiology

## 2020-02-12 ENCOUNTER — Ambulatory Visit (INDEPENDENT_AMBULATORY_CARE_PROVIDER_SITE_OTHER): Payer: Medicare Other | Admitting: Interventional Cardiology

## 2020-02-12 VITALS — BP 128/58 | HR 54 | Ht 74.0 in | Wt 173.0 lb

## 2020-02-12 DIAGNOSIS — E782 Mixed hyperlipidemia: Secondary | ICD-10-CM | POA: Diagnosis not present

## 2020-02-12 DIAGNOSIS — I4819 Other persistent atrial fibrillation: Secondary | ICD-10-CM | POA: Diagnosis not present

## 2020-02-12 DIAGNOSIS — Z7901 Long term (current) use of anticoagulants: Secondary | ICD-10-CM | POA: Diagnosis not present

## 2020-02-12 DIAGNOSIS — Z7189 Other specified counseling: Secondary | ICD-10-CM | POA: Diagnosis not present

## 2020-02-12 DIAGNOSIS — I10 Essential (primary) hypertension: Secondary | ICD-10-CM | POA: Diagnosis not present

## 2020-02-12 NOTE — Patient Instructions (Signed)
Medication Instructions:  Your physician recommends that you continue on your current medications as directed. Please refer to the Current Medication list given to you today.  *If you need a refill on your cardiac medications before your next appointment, please call your pharmacy*   Lab Work: None If you have labs (blood work) drawn today and your tests are completely normal, you will receive your results only by: Marland Kitchen MyChart Message (if you have MyChart) OR . A paper copy in the mail If you have any lab test that is abnormal or we need to change your treatment, we will call you to review the results.   Testing/Procedures: None   Follow-Up: At [facility], you and your health needs are our priority.  As part of our continuing mission to provide you with exceptional heart care, we have created designated Provider Care Teams.  These Care Teams include your primary Cardiologist (physician) and Advanced Practice Providers (APPs -  Physician Assistants and Nurse Practitioners) who all work together to provide you with the care you need, when you need it.    Your next appointment:   12 month(s)  The format for your next appointment:   In Person  Provider:   You may see [name], MD or one of the following Advanced Practice Providers on your designated Care Team:    Truitt Merle, NP  Cecilie Kicks, NP  Kathyrn Drown, NP

## 2020-02-16 DIAGNOSIS — Z1159 Encounter for screening for other viral diseases: Secondary | ICD-10-CM | POA: Diagnosis not present

## 2020-02-16 DIAGNOSIS — Z20828 Contact with and (suspected) exposure to other viral communicable diseases: Secondary | ICD-10-CM | POA: Diagnosis not present

## 2020-02-18 ENCOUNTER — Encounter: Payer: Self-pay | Admitting: Podiatry

## 2020-02-18 ENCOUNTER — Other Ambulatory Visit: Payer: Self-pay

## 2020-02-18 ENCOUNTER — Ambulatory Visit (INDEPENDENT_AMBULATORY_CARE_PROVIDER_SITE_OTHER): Payer: Medicare Other | Admitting: Podiatry

## 2020-02-18 DIAGNOSIS — L84 Corns and callosities: Secondary | ICD-10-CM

## 2020-02-18 DIAGNOSIS — B351 Tinea unguium: Secondary | ICD-10-CM | POA: Diagnosis not present

## 2020-02-18 DIAGNOSIS — M79675 Pain in left toe(s): Secondary | ICD-10-CM

## 2020-02-18 DIAGNOSIS — M79674 Pain in right toe(s): Secondary | ICD-10-CM

## 2020-02-18 DIAGNOSIS — D689 Coagulation defect, unspecified: Secondary | ICD-10-CM | POA: Diagnosis not present

## 2020-02-18 NOTE — Progress Notes (Signed)
Subjective:   Patient ID: Justin Potter, male   DOB: 86 y.o.   MRN: [id number]   HPI Patient presents stating he has nails he cannot cut that get thick and painful and he has a lesion on the bottom of his left that is painful   ROS      Objective:  Physical Exam  Neurovascular status intact with patient found to have thick yellow brittle nailbeds 1-5 both feet and subthird metatarsal left lesion that is painful when palpated     Assessment:  At risk patient who does have A. fib and is on blood thinner with thick yellow painful brittle nailbeds 1-5 both feet and lesion left     Plan:  H&P reviewed both conditions sterile sharp debridement of lesion left accomplished debrided nailbeds 1-5 both feet no iatrogenic bleeding reappoint routine care

## 2020-02-23 DIAGNOSIS — Z1159 Encounter for screening for other viral diseases: Secondary | ICD-10-CM | POA: Diagnosis not present

## 2020-02-23 DIAGNOSIS — Z20828 Contact with and (suspected) exposure to other viral communicable diseases: Secondary | ICD-10-CM | POA: Diagnosis not present

## 2020-03-01 DIAGNOSIS — Z20828 Contact with and (suspected) exposure to other viral communicable diseases: Secondary | ICD-10-CM | POA: Diagnosis not present

## 2020-03-01 DIAGNOSIS — Z1159 Encounter for screening for other viral diseases: Secondary | ICD-10-CM | POA: Diagnosis not present

## 2020-03-07 ENCOUNTER — Other Ambulatory Visit: Payer: Self-pay | Admitting: Interventional Cardiology

## 2020-03-08 DIAGNOSIS — Z20828 Contact with and (suspected) exposure to other viral communicable diseases: Secondary | ICD-10-CM | POA: Diagnosis not present

## 2020-03-08 DIAGNOSIS — Z1159 Encounter for screening for other viral diseases: Secondary | ICD-10-CM | POA: Diagnosis not present

## 2020-03-08 NOTE — Telephone Encounter (Signed)
Last OV 02/12/20 86 years old 78.5kg Scr 0.88 on 06/13/2019

## 2020-03-15 DIAGNOSIS — Z20828 Contact with and (suspected) exposure to other viral communicable diseases: Secondary | ICD-10-CM | POA: Diagnosis not present

## 2020-03-15 DIAGNOSIS — Z1159 Encounter for screening for other viral diseases: Secondary | ICD-10-CM | POA: Diagnosis not present

## 2020-03-22 DIAGNOSIS — Z20828 Contact with and (suspected) exposure to other viral communicable diseases: Secondary | ICD-10-CM | POA: Diagnosis not present

## 2020-03-22 DIAGNOSIS — Z1159 Encounter for screening for other viral diseases: Secondary | ICD-10-CM | POA: Diagnosis not present

## 2020-04-05 DIAGNOSIS — Z20828 Contact with and (suspected) exposure to other viral communicable diseases: Secondary | ICD-10-CM | POA: Diagnosis not present

## 2020-04-05 DIAGNOSIS — Z1159 Encounter for screening for other viral diseases: Secondary | ICD-10-CM | POA: Diagnosis not present

## 2020-04-12 DIAGNOSIS — Z20828 Contact with and (suspected) exposure to other viral communicable diseases: Secondary | ICD-10-CM | POA: Diagnosis not present

## 2020-04-12 DIAGNOSIS — Z1159 Encounter for screening for other viral diseases: Secondary | ICD-10-CM | POA: Diagnosis not present

## 2020-04-20 DIAGNOSIS — Z23 Encounter for immunization: Secondary | ICD-10-CM | POA: Diagnosis not present

## 2020-04-20 DIAGNOSIS — C44619 Basal cell carcinoma of skin of left upper limb, including shoulder: Secondary | ICD-10-CM | POA: Diagnosis not present

## 2020-04-20 DIAGNOSIS — D0461 Carcinoma in situ of skin of right upper limb, including shoulder: Secondary | ICD-10-CM | POA: Diagnosis not present

## 2020-04-20 DIAGNOSIS — D485 Neoplasm of uncertain behavior of skin: Secondary | ICD-10-CM | POA: Diagnosis not present

## 2020-04-20 DIAGNOSIS — L570 Actinic keratosis: Secondary | ICD-10-CM | POA: Diagnosis not present

## 2020-04-20 DIAGNOSIS — D045 Carcinoma in situ of skin of trunk: Secondary | ICD-10-CM | POA: Diagnosis not present

## 2020-04-20 DIAGNOSIS — Z85820 Personal history of malignant melanoma of skin: Secondary | ICD-10-CM | POA: Diagnosis not present

## 2020-04-20 DIAGNOSIS — D1801 Hemangioma of skin and subcutaneous tissue: Secondary | ICD-10-CM | POA: Diagnosis not present

## 2020-04-20 DIAGNOSIS — D0472 Carcinoma in situ of skin of left lower limb, including hip: Secondary | ICD-10-CM | POA: Diagnosis not present

## 2020-04-20 DIAGNOSIS — L821 Other seborrheic keratosis: Secondary | ICD-10-CM | POA: Diagnosis not present

## 2020-04-20 DIAGNOSIS — Z85828 Personal history of other malignant neoplasm of skin: Secondary | ICD-10-CM | POA: Diagnosis not present

## 2020-04-20 DIAGNOSIS — D225 Melanocytic nevi of trunk: Secondary | ICD-10-CM | POA: Diagnosis not present

## 2020-04-26 DIAGNOSIS — Z20828 Contact with and (suspected) exposure to other viral communicable diseases: Secondary | ICD-10-CM | POA: Diagnosis not present

## 2020-04-26 DIAGNOSIS — Z1159 Encounter for screening for other viral diseases: Secondary | ICD-10-CM | POA: Diagnosis not present

## 2020-05-03 DIAGNOSIS — Z1159 Encounter for screening for other viral diseases: Secondary | ICD-10-CM | POA: Diagnosis not present

## 2020-05-03 DIAGNOSIS — R351 Nocturia: Secondary | ICD-10-CM | POA: Diagnosis not present

## 2020-05-03 DIAGNOSIS — Z20828 Contact with and (suspected) exposure to other viral communicable diseases: Secondary | ICD-10-CM | POA: Diagnosis not present

## 2020-05-03 DIAGNOSIS — N35012 Post-traumatic membranous urethral stricture: Secondary | ICD-10-CM | POA: Diagnosis not present

## 2020-05-03 DIAGNOSIS — R3912 Poor urinary stream: Secondary | ICD-10-CM | POA: Diagnosis not present

## 2020-05-10 DIAGNOSIS — Z20828 Contact with and (suspected) exposure to other viral communicable diseases: Secondary | ICD-10-CM | POA: Diagnosis not present

## 2020-05-10 DIAGNOSIS — Z1159 Encounter for screening for other viral diseases: Secondary | ICD-10-CM | POA: Diagnosis not present

## 2020-05-17 DIAGNOSIS — Z20828 Contact with and (suspected) exposure to other viral communicable diseases: Secondary | ICD-10-CM | POA: Diagnosis not present

## 2020-05-17 DIAGNOSIS — Z1159 Encounter for screening for other viral diseases: Secondary | ICD-10-CM | POA: Diagnosis not present

## 2020-05-24 DIAGNOSIS — Z1159 Encounter for screening for other viral diseases: Secondary | ICD-10-CM | POA: Diagnosis not present

## 2020-05-24 DIAGNOSIS — Z20828 Contact with and (suspected) exposure to other viral communicable diseases: Secondary | ICD-10-CM | POA: Diagnosis not present

## 2020-05-26 ENCOUNTER — Encounter: Payer: Self-pay | Admitting: Podiatry

## 2020-05-26 ENCOUNTER — Ambulatory Visit (INDEPENDENT_AMBULATORY_CARE_PROVIDER_SITE_OTHER): Payer: Medicare Other | Admitting: Podiatry

## 2020-05-26 ENCOUNTER — Other Ambulatory Visit: Payer: Self-pay

## 2020-05-26 DIAGNOSIS — M79674 Pain in right toe(s): Secondary | ICD-10-CM | POA: Diagnosis not present

## 2020-05-26 DIAGNOSIS — B351 Tinea unguium: Secondary | ICD-10-CM

## 2020-05-26 DIAGNOSIS — H353212 Exudative age-related macular degeneration, right eye, with inactive choroidal neovascularization: Secondary | ICD-10-CM | POA: Insufficient documentation

## 2020-05-26 DIAGNOSIS — H35051 Retinal neovascularization, unspecified, right eye: Secondary | ICD-10-CM | POA: Insufficient documentation

## 2020-05-26 DIAGNOSIS — H353222 Exudative age-related macular degeneration, left eye, with inactive choroidal neovascularization: Secondary | ICD-10-CM | POA: Insufficient documentation

## 2020-05-26 DIAGNOSIS — M79675 Pain in left toe(s): Secondary | ICD-10-CM

## 2020-05-26 DIAGNOSIS — H353211 Exudative age-related macular degeneration, right eye, with active choroidal neovascularization: Secondary | ICD-10-CM | POA: Insufficient documentation

## 2020-05-26 DIAGNOSIS — H353124 Nonexudative age-related macular degeneration, left eye, advanced atrophic with subfoveal involvement: Secondary | ICD-10-CM | POA: Insufficient documentation

## 2020-05-26 DIAGNOSIS — D689 Coagulation defect, unspecified: Secondary | ICD-10-CM

## 2020-05-26 DIAGNOSIS — H353114 Nonexudative age-related macular degeneration, right eye, advanced atrophic with subfoveal involvement: Secondary | ICD-10-CM | POA: Insufficient documentation

## 2020-05-26 NOTE — Progress Notes (Signed)
Subjective:   Patient ID: Justin Potter, male   DOB: 86 y.o.   MRN: [id number]   HPI Patient presents with elongated nailbeds 1-5 both feet that are thickened and can become painful at times   ROS      Objective:  Physical Exam  Mycotic nail infection 1-5 both feet with pain in the corners     Assessment:  Reviewed thick dystrophic nail beds with patient that are incurvated and sore when I pressed     Plan:  Debridement of nailbeds 1-5 both feet no iatrogenic bleeding reappoint routine care

## 2020-05-31 ENCOUNTER — Encounter (INDEPENDENT_AMBULATORY_CARE_PROVIDER_SITE_OTHER): Payer: Self-pay | Admitting: Ophthalmology

## 2020-05-31 ENCOUNTER — Ambulatory Visit (INDEPENDENT_AMBULATORY_CARE_PROVIDER_SITE_OTHER): Payer: Medicare Other | Admitting: Ophthalmology

## 2020-05-31 ENCOUNTER — Other Ambulatory Visit: Payer: Self-pay

## 2020-05-31 DIAGNOSIS — H353222 Exudative age-related macular degeneration, left eye, with inactive choroidal neovascularization: Secondary | ICD-10-CM | POA: Diagnosis not present

## 2020-05-31 DIAGNOSIS — H353114 Nonexudative age-related macular degeneration, right eye, advanced atrophic with subfoveal involvement: Secondary | ICD-10-CM | POA: Diagnosis not present

## 2020-05-31 DIAGNOSIS — H353212 Exudative age-related macular degeneration, right eye, with inactive choroidal neovascularization: Secondary | ICD-10-CM

## 2020-05-31 DIAGNOSIS — H35051 Retinal neovascularization, unspecified, right eye: Secondary | ICD-10-CM

## 2020-05-31 DIAGNOSIS — Z1159 Encounter for screening for other viral diseases: Secondary | ICD-10-CM | POA: Diagnosis not present

## 2020-05-31 DIAGNOSIS — Z20828 Contact with and (suspected) exposure to other viral communicable diseases: Secondary | ICD-10-CM | POA: Diagnosis not present

## 2020-05-31 DIAGNOSIS — H353124 Nonexudative age-related macular degeneration, left eye, advanced atrophic with subfoveal involvement: Secondary | ICD-10-CM

## 2020-05-31 NOTE — Progress Notes (Signed)
05/31/2020     CHIEF COMPLAINT Patient presents for Retina Follow Up   HISTORY OF PRESENT ILLNESS: Justin Potter is a 86 y.o. male who presents to the clinic today for:   HPI    Retina Follow Up    Patient presents with  Dry AMD.  In both eyes.  Severity is moderate.  Duration of 1 year.  Since onset it is stable.  I, the attending physician,  performed the HPI with the patient and updated documentation appropriately.          Comments    1 Year f\u OU. Oct  Pt states he has noticed a slight decrease in vision. Denies FOL and floaters.       Last edited by Tilda Franco on 05/31/2020 12:52 PM. (History)      Referring physician: [name], MD [place],   [id number]  HISTORICAL INFORMATION:   Selected notes from the [place]: No current outpatient medications on file. (Ophthalmic Drugs)   No current facility-administered medications for this visit. (Ophthalmic Drugs)   Current Outpatient Medications (Other)  Medication Sig  . amoxicillin (AMOXIL) 500 MG capsule Take 500 mg by mouth 3 (three) times daily.  Marland Kitchen ELIQUIS 5 MG TABS tablet Take 1 tablet (5 mg total) by mouth 2 (two) times daily.  . imiquimod (ALDARA) 5 % cream Apply 1 application topically at bedtime.  . Multiple Vitamins-Minerals (OCUVITE PRESERVISION PO) Take 1 tablet by mouth every morning.    No current facility-administered medications for this visit. (Other)      REVIEW OF SYSTEMS:    ALLERGIES No Known Allergies  PAST MEDICAL HISTORY Past Medical History:  Diagnosis Date  . Anxiety   . Arthritis    "in the fingers" per pt  . Atrial fibrillation ([place])   . Bladder neck contracture   . Dysrhythmia    A-FIB / HX BRADYCARDIA - FOLLOWED BY DR. [place]  . Gross hematuria   . History of gout   . History of prostate cancer    S/P RADIOACTIVE SEED IMPLANTS  . History of  squamous cell carcinoma of skin    EXCISION LOWER LIP AND RIGHT EAR  . History of TIA (transient ischemic attack)    probable tia no residual  09-09-2012  . Hyperlipidemia   . Hypertension   . Macular degeneration    both eyes  . Urethral stricture   . Urinary retention    Past Surgical History:  Procedure Laterality Date  . APPENDECTOMY    . CATARACT EXTRACTION W/ INTRAOCULAR LENS  IMPLANT, BILATERAL    . CHOLECYSTECTOMY  1977  . CYSTOSCOPY WITH URETHRAL DILATATION N/A 02/12/2013   Procedure: CYSTOSCOPY WITH BALLOON DILATATION OF URETHRAL STRICTURE AND BLADDER FULGERATION;  Surgeon: Bernestine Amass, MD;  Location: [facility];  Service: Urology;  Laterality: N/A;  . LAPAROSCOPIC APPENDECTOMY N/A 08/22/2013   Procedure: APPENDECTOMY LAPAROSCOPIC;  Surgeon: Pedro Earls, MD;  Location: [facility];  Service: General;  Laterality: N/A;  . [place]  . TONSILLECTOMY  as child  . TOTAL KNEE ARTHROPLASTY Right 11-23-2004    FAMILY HISTORY Family History  Problem Relation Age of Onset  . Cancer Mother   . Cancer Father     SOCIAL HISTORY Social History   Tobacco Use  . Smoking status: Former Smoker  Packs/day: 1.50    Years: 35.00    Pack years: 52.50    Types: Cigarettes    Quit date: 01/30/1979    Years since quitting: 41.3  . Smokeless tobacco: Never Used  Vaping Use  . Vaping Use: Never used  Substance Use Topics  . Alcohol use: Yes    Comment: "5 days per week I drink 1 glass of wine"  . Drug use: No         OPHTHALMIC EXAM: Base Eye Exam    Visual Acuity (Snellen - Linear)      Right Left   Dist cc 20/200 HM temporally   Dist ph cc 20/200 +        Tonometry (Tonopen, 12:57 PM)      Right Left   Pressure 17 16       Pupils      Pupils Dark Light Shape React APD   Right PERRL 3 3 Round Minimal None   Left PERRL 3 3 Round Minimal +1       Visual Fields (Counting fingers)      Left Right     Full   Restrictions Total  superior temporal, inferior temporal, superior nasal, inferior nasal deficiencies        Neuro/Psych    Oriented x3: Yes   Mood/Affect: Normal       Dilation    Both eyes: 1.0% Mydriacyl, 2.5% Phenylephrine @ 12:57 PM        Slit Lamp and Fundus Exam    External Exam      Right Left   External Normal Normal       Slit Lamp Exam      Right Left   Lids/Lashes Normal Normal   Conjunctiva/Sclera White and quiet White and quiet   Cornea Clear Clear   Anterior Chamber Deep and quiet Deep and quiet   Iris Round and reactive Round and reactive   Lens Centered posterior chamber intraocular lens Centered posterior chamber intraocular lens   Anterior Vitreous Normal Normal       Fundus Exam      Right Left   Posterior Vitreous Posterior vitreous detachment Posterior vitreous detachment   Disc Normal Normal   C/D Ratio 0.5 0.5   Macula Retinal pigment epithelial atrophy - geographic Disciform scar, no active margins to the large disciform scar encompassing the entire clinicians macula   Vessels Normal Normal   Periphery Normal Normal          IMAGING AND PROCEDURES  Imaging and Procedures for 05/31/20  OCT, Retina - OU - Both Eyes       Right Eye Quality was good. Scan locations included subfoveal. Central Foveal Thickness: 170. Findings include outer retinal atrophy, central retinal atrophy.   Left Eye Quality was good. Scan locations included subfoveal. Central Foveal Thickness: 265. Findings include abnormal foveal contour, disciform scar.   Notes Geographic atrophy OD, no clinically discernible progression                ASSESSMENT/PLAN:  Exudative age-related macular degeneration of left eye with inactive choroidal neovascularization (HCC) Stable OU  Advanced nonexudative age-related macular degeneration of right eye with subfoveal involvement Stable OU      ICD-10-CM   1. Advanced nonexudative age-related macular degeneration of left eye with  subfoveal involvement  H35.3124 OCT, Retina - OU - Both Eyes  2. Exudative age-related macular degeneration of right eye with inactive choroidal neovascularization (HCC)  H35.3212 OCT, Retina - OU -  Both Eyes  3. Exudative age-related macular degeneration of left eye with inactive choroidal neovascularization ([place])  H35.3222   4. Advanced nonexudative age-related macular degeneration of right eye with subfoveal involvement  H35.3114   5. Choroidal neovascularization of right eye  H35.051     1.  No active therapy warranted at this time.  2.  3.  Ophthalmic Meds Ordered this visit:  No orders of the defined types were placed in this encounter.      Return in about 1 year (around 05/31/2021) for DILATE OU, OCT.  There are no Patient Instructions on file for this visit.   Explained the diagnoses, plan, and follow up with the patient and they expressed understanding.  Patient expressed understanding of the importance of proper follow up care.   Clent Demark Sable Knoles M.D. Diseases & Surgery of the Retina and Vitreous [facility] [place] 05/31/20     Abbreviations: M myopia (nearsighted); A astigmatism; H hyperopia (farsighted); P presbyopia; Mrx spectacle prescription;  CTL contact lenses; OD right eye; OS left eye; OU both eyes  XT exotropia; ET esotropia; PEK punctate epithelial keratitis; PEE punctate epithelial erosions; DES dry eye syndrome; MGD meibomian gland dysfunction; ATs artificial tears; PFAT's preservative free artificial tears; [place] nuclear sclerotic cataract; PSC posterior subcapsular cataract; ERM epi-retinal membrane; PVD posterior vitreous detachment; RD retinal detachment; DM diabetes mellitus; DR diabetic retinopathy; NPDR non-proliferative diabetic retinopathy; PDR proliferative diabetic retinopathy; CSME clinically significant macular edema; DME diabetic macular edema; dbh dot blot hemorrhages; CWS cotton wool spot; POAG primary open angle glaucoma; C/D  cup-to-disc ratio; HVF humphrey visual field; GVF goldmann visual field; OCT optical coherence tomography; IOP intraocular pressure; BRVO Branch retinal vein occlusion; CRVO central retinal vein occlusion; CRAO central retinal artery occlusion; BRAO branch retinal artery occlusion; RT retinal tear; SB scleral buckle; PPV pars plana vitrectomy; VH Vitreous hemorrhage; PRP panretinal laser photocoagulation; IVK intravitreal kenalog; VMT vitreomacular traction; MH Macular hole;  NVD neovascularization of the disc; NVE neovascularization elsewhere; AREDS age related eye disease study; ARMD age related macular degeneration; POAG primary open angle glaucoma; EBMD epithelial/anterior basement membrane dystrophy; ACIOL anterior chamber intraocular lens; IOL intraocular lens; PCIOL posterior chamber intraocular lens; Phaco/IOL phacoemulsification with intraocular lens placement; [place] photorefractive keratectomy; LASIK laser assisted in situ keratomileusis; HTN hypertension; DM diabetes mellitus; COPD chronic obstructive pulmonary disease

## 2020-05-31 NOTE — Assessment & Plan Note (Signed)
Stable OU 

## 2020-06-14 DIAGNOSIS — Z20828 Contact with and (suspected) exposure to other viral communicable diseases: Secondary | ICD-10-CM | POA: Diagnosis not present

## 2020-06-14 DIAGNOSIS — Z1159 Encounter for screening for other viral diseases: Secondary | ICD-10-CM | POA: Diagnosis not present

## 2020-06-21 DIAGNOSIS — Z20828 Contact with and (suspected) exposure to other viral communicable diseases: Secondary | ICD-10-CM | POA: Diagnosis not present

## 2020-06-21 DIAGNOSIS — Z1159 Encounter for screening for other viral diseases: Secondary | ICD-10-CM | POA: Diagnosis not present

## 2020-07-05 DIAGNOSIS — Z1159 Encounter for screening for other viral diseases: Secondary | ICD-10-CM | POA: Diagnosis not present

## 2020-07-05 DIAGNOSIS — Z20828 Contact with and (suspected) exposure to other viral communicable diseases: Secondary | ICD-10-CM | POA: Diagnosis not present

## 2020-07-12 DIAGNOSIS — Z1159 Encounter for screening for other viral diseases: Secondary | ICD-10-CM | POA: Diagnosis not present

## 2020-07-12 DIAGNOSIS — Z20828 Contact with and (suspected) exposure to other viral communicable diseases: Secondary | ICD-10-CM | POA: Diagnosis not present

## 2020-07-19 DIAGNOSIS — Z20828 Contact with and (suspected) exposure to other viral communicable diseases: Secondary | ICD-10-CM | POA: Diagnosis not present

## 2020-07-19 DIAGNOSIS — Z1159 Encounter for screening for other viral diseases: Secondary | ICD-10-CM | POA: Diagnosis not present

## 2020-07-26 DIAGNOSIS — Z20828 Contact with and (suspected) exposure to other viral communicable diseases: Secondary | ICD-10-CM | POA: Diagnosis not present

## 2020-07-26 DIAGNOSIS — I4891 Unspecified atrial fibrillation: Secondary | ICD-10-CM | POA: Diagnosis not present

## 2020-07-26 DIAGNOSIS — I10 Essential (primary) hypertension: Secondary | ICD-10-CM | POA: Diagnosis not present

## 2020-07-26 DIAGNOSIS — M109 Gout, unspecified: Secondary | ICD-10-CM | POA: Diagnosis not present

## 2020-07-26 DIAGNOSIS — Z1159 Encounter for screening for other viral diseases: Secondary | ICD-10-CM | POA: Diagnosis not present

## 2020-07-26 DIAGNOSIS — R269 Unspecified abnormalities of gait and mobility: Secondary | ICD-10-CM | POA: Diagnosis not present

## 2020-07-26 DIAGNOSIS — K222 Esophageal obstruction: Secondary | ICD-10-CM | POA: Diagnosis not present

## 2020-07-26 DIAGNOSIS — H548 Legal blindness, as defined in USA: Secondary | ICD-10-CM | POA: Diagnosis not present

## 2020-07-26 DIAGNOSIS — D6869 Other thrombophilia: Secondary | ICD-10-CM | POA: Diagnosis not present

## 2020-07-26 DIAGNOSIS — Z0000 Encounter for general adult medical examination without abnormal findings: Secondary | ICD-10-CM | POA: Diagnosis not present

## 2020-07-26 DIAGNOSIS — Z8546 Personal history of malignant neoplasm of prostate: Secondary | ICD-10-CM | POA: Diagnosis not present

## 2020-08-02 DIAGNOSIS — Z20828 Contact with and (suspected) exposure to other viral communicable diseases: Secondary | ICD-10-CM | POA: Diagnosis not present

## 2020-08-02 DIAGNOSIS — Z1159 Encounter for screening for other viral diseases: Secondary | ICD-10-CM | POA: Diagnosis not present

## 2020-08-09 DIAGNOSIS — Z20828 Contact with and (suspected) exposure to other viral communicable diseases: Secondary | ICD-10-CM | POA: Diagnosis not present

## 2020-08-09 DIAGNOSIS — Z1159 Encounter for screening for other viral diseases: Secondary | ICD-10-CM | POA: Diagnosis not present

## 2020-08-16 DIAGNOSIS — Z1159 Encounter for screening for other viral diseases: Secondary | ICD-10-CM | POA: Diagnosis not present

## 2020-08-16 DIAGNOSIS — Z20828 Contact with and (suspected) exposure to other viral communicable diseases: Secondary | ICD-10-CM | POA: Diagnosis not present

## 2020-08-18 ENCOUNTER — Other Ambulatory Visit: Payer: Self-pay | Admitting: *Deleted

## 2020-08-18 MED ORDER — ELIQUIS 5 MG PO TABS: 5.0000 mg | ORAL_TABLET | Freq: Two times a day (BID) | ORAL | 1 refills | Status: DC

## 2020-08-18 NOTE — Telephone Encounter (Signed)
Eliquis 5mg  refill request received. Patient is 86 years old, weight-78.5kg, Crea-0.97 on 07/26/20 via [facility] at [place], [place], and last seen by Dr. Tamala Julian on 02/12/2020. Dose is appropriate based on dosing criteria. Will send in refill to requested pharmacy.

## 2020-08-23 DIAGNOSIS — Z20828 Contact with and (suspected) exposure to other viral communicable diseases: Secondary | ICD-10-CM | POA: Diagnosis not present

## 2020-08-23 DIAGNOSIS — Z1159 Encounter for screening for other viral diseases: Secondary | ICD-10-CM | POA: Diagnosis not present

## 2020-08-30 DIAGNOSIS — Z1159 Encounter for screening for other viral diseases: Secondary | ICD-10-CM | POA: Diagnosis not present

## 2020-08-30 DIAGNOSIS — Z20828 Contact with and (suspected) exposure to other viral communicable diseases: Secondary | ICD-10-CM | POA: Diagnosis not present

## 2020-09-01 ENCOUNTER — Encounter: Payer: Self-pay | Admitting: Podiatry

## 2020-09-01 ENCOUNTER — Other Ambulatory Visit: Payer: Self-pay

## 2020-09-01 ENCOUNTER — Ambulatory Visit (INDEPENDENT_AMBULATORY_CARE_PROVIDER_SITE_OTHER): Payer: Medicare Other | Admitting: Podiatry

## 2020-09-01 DIAGNOSIS — L84 Corns and callosities: Secondary | ICD-10-CM

## 2020-09-01 DIAGNOSIS — M79675 Pain in left toe(s): Secondary | ICD-10-CM

## 2020-09-01 DIAGNOSIS — M79674 Pain in right toe(s): Secondary | ICD-10-CM | POA: Diagnosis not present

## 2020-09-01 DIAGNOSIS — B351 Tinea unguium: Secondary | ICD-10-CM

## 2020-09-01 DIAGNOSIS — D689 Coagulation defect, unspecified: Secondary | ICD-10-CM

## 2020-09-01 NOTE — Progress Notes (Signed)
Subjective:   Patient ID: Justin Potter, male   DOB: 87 y.o.   MRN: [id number]   HPI Patient presents stating that his nails are thick bothersome for him he cannot take care of them due to age reaching them and he has a lesion underneath his left foot that sore and he is on a blood thinner and cannot take care of himself and bleeds very easily neuro   ROS      Objective:  Physical Exam  Vascular status unchanged with thick keratotic lesion subthird metatarsal head left and elongated incurvated nailbeds 1-5 both feet     Assessment:  Chronic mycotic nail infection 1-5 both feet lesion left with at risk blood thinner     Plan:  Debrided nailbeds 1-5 both feet Neutra genic bleeding debrided lesion left no iatrogenic bleeding reappoint routine care

## 2020-09-06 DIAGNOSIS — Z20828 Contact with and (suspected) exposure to other viral communicable diseases: Secondary | ICD-10-CM | POA: Diagnosis not present

## 2020-09-06 DIAGNOSIS — Z1159 Encounter for screening for other viral diseases: Secondary | ICD-10-CM | POA: Diagnosis not present

## 2020-09-13 DIAGNOSIS — Z1159 Encounter for screening for other viral diseases: Secondary | ICD-10-CM | POA: Diagnosis not present

## 2020-09-13 DIAGNOSIS — Z20828 Contact with and (suspected) exposure to other viral communicable diseases: Secondary | ICD-10-CM | POA: Diagnosis not present

## 2020-09-15 DIAGNOSIS — R3912 Poor urinary stream: Secondary | ICD-10-CM | POA: Diagnosis not present

## 2020-09-15 DIAGNOSIS — N3942 Incontinence without sensory awareness: Secondary | ICD-10-CM | POA: Diagnosis not present

## 2020-09-15 DIAGNOSIS — R351 Nocturia: Secondary | ICD-10-CM | POA: Diagnosis not present

## 2020-09-15 DIAGNOSIS — N35012 Post-traumatic membranous urethral stricture: Secondary | ICD-10-CM | POA: Diagnosis not present

## 2020-09-20 DIAGNOSIS — Z20828 Contact with and (suspected) exposure to other viral communicable diseases: Secondary | ICD-10-CM | POA: Diagnosis not present

## 2020-09-20 DIAGNOSIS — Z1159 Encounter for screening for other viral diseases: Secondary | ICD-10-CM | POA: Diagnosis not present

## 2020-09-27 DIAGNOSIS — Z20828 Contact with and (suspected) exposure to other viral communicable diseases: Secondary | ICD-10-CM | POA: Diagnosis not present

## 2020-09-27 DIAGNOSIS — Z1159 Encounter for screening for other viral diseases: Secondary | ICD-10-CM | POA: Diagnosis not present

## 2020-10-04 DIAGNOSIS — Z20828 Contact with and (suspected) exposure to other viral communicable diseases: Secondary | ICD-10-CM | POA: Diagnosis not present

## 2020-10-04 DIAGNOSIS — Z1159 Encounter for screening for other viral diseases: Secondary | ICD-10-CM | POA: Diagnosis not present

## 2020-10-11 DIAGNOSIS — Z1159 Encounter for screening for other viral diseases: Secondary | ICD-10-CM | POA: Diagnosis not present

## 2020-10-11 DIAGNOSIS — Z20828 Contact with and (suspected) exposure to other viral communicable diseases: Secondary | ICD-10-CM | POA: Diagnosis not present

## 2020-10-12 ENCOUNTER — Other Ambulatory Visit: Payer: Self-pay | Admitting: Gastroenterology

## 2020-10-12 DIAGNOSIS — K222 Esophageal obstruction: Secondary | ICD-10-CM | POA: Diagnosis not present

## 2020-10-12 DIAGNOSIS — R1310 Dysphagia, unspecified: Secondary | ICD-10-CM | POA: Diagnosis not present

## 2020-10-13 DIAGNOSIS — Z85828 Personal history of other malignant neoplasm of skin: Secondary | ICD-10-CM | POA: Diagnosis not present

## 2020-10-13 DIAGNOSIS — L723 Sebaceous cyst: Secondary | ICD-10-CM | POA: Diagnosis not present

## 2020-10-13 DIAGNOSIS — D225 Melanocytic nevi of trunk: Secondary | ICD-10-CM | POA: Diagnosis not present

## 2020-10-13 DIAGNOSIS — Z85820 Personal history of malignant melanoma of skin: Secondary | ICD-10-CM | POA: Diagnosis not present

## 2020-10-13 DIAGNOSIS — L570 Actinic keratosis: Secondary | ICD-10-CM | POA: Diagnosis not present

## 2020-10-13 DIAGNOSIS — L821 Other seborrheic keratosis: Secondary | ICD-10-CM | POA: Diagnosis not present

## 2020-10-18 DIAGNOSIS — Z20828 Contact with and (suspected) exposure to other viral communicable diseases: Secondary | ICD-10-CM | POA: Diagnosis not present

## 2020-10-18 DIAGNOSIS — Z1159 Encounter for screening for other viral diseases: Secondary | ICD-10-CM | POA: Diagnosis not present

## 2020-10-19 ENCOUNTER — Telehealth: Payer: Self-pay | Admitting: *Deleted

## 2020-10-19 NOTE — Telephone Encounter (Signed)
   [facility] Pre-operative Risk Assessment    HEARTCARE STAFF: - Please ensure there is not already an duplicate clearance open for this procedure. - Under Visit Info/Reason for Call, type in Other and utilize the format Clearance MM/DD/YY or Clearance TBD. Do not use dashes or single digits. - If request is for dental extraction, please clarify the # of teeth to be extracted.  Request for surgical clearance:  1. What type of surgery is being performed? Endoscopy with dilation   2. When is this surgery scheduled? 11/02/2020   3. What type of clearance is required (medical clearance vs. Pharmacy clearance to hold med vs. Both)? Both  4. Are there any medications that need to be held prior to surgery and how long?Eliquis   5. Practice name and name of physician performing surgery? [facility], Dr Watt Climes   6. What is the office phone number? [phone]   7.   What is the office fax number? [phone]  8.   Anesthesia type (None, local, MAC, general) ? Not listed   Juventino Slovak 10/19/2020, 4:45 PM  _________________________________________________________________   (provider comments below)

## 2020-10-20 NOTE — Telephone Encounter (Signed)
Primary Cardiologist:Henry Nicholes Stairs III, MD  Chart reviewed as part of pre-operative protocol coverage. Because of Justin Potter's past medical history and time since last visit, he/she will require a follow-up visit in order to better assess preoperative cardiovascular risk.  Pre-op covering staff: - Please schedule appointment and call patient to inform them. - Please contact requesting surgeon's office via preferred method (i.e, phone, fax) to inform them of need for appointment prior to surgery.  If applicable, this message will also be routed to pharmacy pool and/or primary cardiologist for input on holding anticoagulant/antiplatelet agent as requested below so that this information is available at time of patient's appointment.   Deberah Pelton, NP  10/20/2020, 10:40 AM

## 2020-10-20 NOTE — Telephone Encounter (Signed)
I called the [phone] number though this number is for his son Lucian Baswell, who is in [place] right now. After speaking with pt's son Bairon that his dad will need a pre op appt before he can be cleared. I advised the only appt that I had currently available is 10/22/20 @ 10 am with Dr. Tamala Julian. Pt's son said to go ahead with that appt and he will reach out to the pt and [place] to set up transportation. I left detailed message for the pt on number [phone].  I will forward clearance notes to MD for upcoming appt.

## 2020-10-20 NOTE — Telephone Encounter (Signed)
Patient with diagnosis of A Fib on Eliquis for anticoagulation.    Procedure:  11/02/20  Date of procedure: Endoscopy with dilation   CHA2DS2-VASc Score = 5  This indicates a 7.2% annual risk of stroke. The patient's score is based upon: CHF History: No HTN History: Yes Diabetes History: No Stroke History: Yes (TIA 09/09/12) Vascular Disease History: No Age Score: 2 Gender Score: 0   CrCl 54 mL/min Platelet count: no recent labs  Per office protocol, patient can hold Eliquis for 1 day prior to procedure.  If MD requests more than 1 day Eliquis hold, will need to route to Dr. Tamala Julian for authorization due to patient's history of TIA

## 2020-10-21 NOTE — Progress Notes (Signed)
Cardiology Office Note:    Date:  10/22/2020   [name], DOB 1925/09/17, MRN [id number]  PCP:  [name], MD  Cardiologist:  [name], MD   Referring MD: [name], MD   Chief Complaint  Patient presents with  . Atrial Fibrillation  . Pre-op Exam    History of Present Illness:    Justin Potter is a 87 y.o. male with a hx of  atrial fibrillation, chronic anticoagulation therapy, bradycardia, and h/o hypertension. Here for preoperative eval prior to esophageal dilatation.  [name] is here for follow-up evaluation.  Last office visit was July 2021.  He has generally felt well.  He continues to run bradycardic and has variable blood pressures.  He is in atrial fibrillation with heart rates that ranged between 45 and 70 bpm.  He has not had syncope.  He denies angina.  There is no significant lower extremity swelling.  He is not having orthopnea.  No recent episodes of transient neurological abnormality.  He did have TIA in 2014.  He is having dysphagia.  Dr. Watt Climes plans to perform esophageal dilatation.  This office visit was scheduled by [place] to make sure that he is doing well clinically prior to the procedure.  The procedure is scheduled for October 30, 2020.  In speaking with the patient, he has not had any rectal bleeding.  He denies dark/black stools.  Past Medical History:  Diagnosis Date  . Anxiety   . Arthritis    "in the fingers" per pt  . Atrial fibrillation ([place])   . Bladder neck contracture   . Dysrhythmia    A-FIB / HX BRADYCARDIA - FOLLOWED BY DR. [place]  . Gross hematuria   . History of gout   . History of prostate cancer    S/P RADIOACTIVE SEED IMPLANTS  . History of squamous cell carcinoma of skin    EXCISION LOWER LIP AND RIGHT EAR  . History of TIA (transient ischemic attack)    probable tia no residual  09-09-2012  . Hyperlipidemia   . Hypertension   . Macular degeneration    both eyes   . Urethral stricture   . Urinary retention     Past Surgical History:  Procedure Laterality Date  . APPENDECTOMY    . CATARACT EXTRACTION W/ INTRAOCULAR LENS  IMPLANT, BILATERAL    . CHOLECYSTECTOMY  1977  . CYSTOSCOPY WITH URETHRAL DILATATION N/A 02/12/2013   Procedure: CYSTOSCOPY WITH BALLOON DILATATION OF URETHRAL STRICTURE AND BLADDER FULGERATION;  Surgeon: Bernestine Amass, MD;  Location: [facility];  Service: Urology;  Laterality: N/A;  . LAPAROSCOPIC APPENDECTOMY N/A 08/22/2013   Procedure: APPENDECTOMY LAPAROSCOPIC;  Surgeon: Pedro Earls, MD;  Location: [facility];  Service: General;  Laterality: N/A;  . [place]  . TONSILLECTOMY  as child  . TOTAL KNEE ARTHROPLASTY Right 11-23-2004    Current Medications: Current Meds  Medication Sig  . ELIQUIS 5 MG TABS tablet Take 1 tablet (5 mg total) by mouth 2 (two) times daily.  . Multiple Vitamins-Minerals (OCUVITE PRESERVISION PO) Take 1 tablet by mouth every morning.   . [DISCONTINUED] acetaminophen (TYLENOL) 325 MG tablet Take 650 mg by mouth every 6 (six) hours as needed (pain).     Allergies:   Patient has no known allergies.   Social History   Socioeconomic History  . Marital status: Married    Spouse name:  Not on file  . Number of children: Not on file  . Years of education: Not on file  . Highest education level: Not on file  Occupational History  . Not on file  Tobacco Use  . Smoking status: Former Smoker    Packs/day: 1.50    Years: 35.00    Pack years: 52.50    Types: Cigarettes    Quit date: 01/30/1979    Years since quitting: 41.7  . Smokeless tobacco: Never Used  Vaping Use  . Vaping Use: Never used  Substance and Sexual Activity  . Alcohol use: Yes    Comment: "5 days per week I drink 1 glass of wine"  . Drug use: No  . Sexual activity: Not Currently  Other Topics Concern  . Not on file  Social History Narrative  . Not on file   Social Determinants of Health    Financial Resource Strain: Not on file  Food Insecurity: Not on file  Transportation Needs: Not on file  Physical Activity: Not on file  Stress: Not on file  Social Connections: Not on file     Family History: The patient's family history includes Cancer in his father and mother.  ROS:   Please see the history of present illness.    When he swallows, food gets caught in his throat.  He has had esophageal dilatation in the past.  All other systems reviewed and are negative.  EKGs/Labs/Other Studies Reviewed:    The following studies were reviewed today: No new cardiac imaging or monitoring data.  Long-term monitor 08/04/2019: Study Highlights   Continuous atrial fibrillation. While awake heart rates generally above 50 bpm.  Slow ventricular response with nocturnal pauses greater than 3 seconds but less than 4.5 seconds on 60 occasions.     EKG:  EKG atrial fibrillation with slow ventricular response.  Ashman's phenomenon is noted.  Average heart rate is 46 bpm.  Low voltage is noted in the limb leads.  Rightward axis is noted.  When compared with his tracing performed in December 2020, no changes occurred.  Heart rate is slightly faster.  Recent Labs: No results found for requested labs within last 8760 hours.  Recent Lipid Panel No results found for: CHOL, TRIG, HDL, CHOLHDL, VLDL, LDLCALC, LDLDIRECT  Physical Exam:    VS:  BP (!) 116/56   Pulse (!) 46   Ht 6\' 2"  (1.88 m)   Wt 172 lb 12.8 oz (78.4 kg)   SpO2 98%   BMI 22.19 kg/m     Wt Readings from Last 3 Encounters:  10/22/20 172 lb 12.8 oz (78.4 kg)  02/12/20 173 lb (78.5 kg)  06/25/19 162 lb 12.8 oz (73.8 kg)     GEN: Elderly but not frail.  Ambulated and on his own.. No acute distress HEENT: Normal NECK: No JVD. LYMPHATICS: No lymphadenopathy CARDIAC: 1-2 over 6 left lower sternal systolic murmur.  Irregularly irregular RR without gallop, or edema. VASCULAR:  Normal Pulses. No bruits. RESPIRATORY:   Clear to auscultation without rales, wheezing or rhonchi  ABDOMEN: Soft, non-tender, non-distended, No pulsatile mass, MUSCULOSKELETAL: No deformity  SKIN: Warm and dry NEUROLOGIC:  Alert and oriented x 3 PSYCHIATRIC:  Normal affect   ASSESSMENT:    1. Pre-operative clearance   2. Persistent atrial fibrillation ([place])   3. Long term current use of anticoagulant therapy   4. Essential hypertension   5. Mixed hyperlipidemia   6. Educated about COVID-19 virus infection    PLAN:  In order of problems listed above:  1. He is cleared for the upcoming esophageal dilatation.  He should hold Eliquis for at least 72 hours prior to the procedure.  Eliquis should be resumed as soon as possible after the procedure based upon the results and whether or not any bleeding was noted.  We will leave this to Dr. Perley Jain clinical discretion. 2. A. fib with slow ventricular response.  No syncope and this has been a stable rhythm for him over the last 5 years.  No action item related to this rhythm in the upcoming procedure. 3. Have decided not to bridge the patient.  He is having esophageal dilatation and risk of bleeding will be elevated.  The window of Eliquis discontinuation is relatively small and I think that embolic risk would also be small.  This was discussed with the patient. 4. Blood pressure is low normal for age.  No change in therapy.  He is on no antihypertensive therapy. 5. Lipids are not being managed actively with medication.  Most recent LDL was 86.  Likely correlates with his longevity. 6. He has not had Covid or any Covid related issues.  He is vaccinated and boosted.   Medication Adjustments/Labs and Tests Ordered: Current medicines are reviewed at length with the patient today.  Concerns regarding medicines are outlined above.  Orders Placed This Encounter  Procedures  . EKG 12-Lead   No orders of the defined types were placed in this encounter.   Patient Instructions   Medication Instructions:  Your physician recommends that you continue on your current medications as directed. Please refer to the Current Medication list given to you today.  You may stop your Eliquis 3 days prior to your procedure.  *If you need a refill on your cardiac medications before your next appointment, please call your pharmacy*   Lab Work: None If you have labs (blood work) drawn today and your tests are completely normal, you will receive your results only by: Marland Kitchen MyChart Message (if you have MyChart) OR . A paper copy in the mail If you have any lab test that is abnormal or we need to change your treatment, we will call you to review the results.   Testing/Procedures: None   Follow-Up: At [facility], you and your health needs are our priority.  As part of our continuing mission to provide you with exceptional heart care, we have created designated Provider Care Teams.  These Care Teams include your primary Cardiologist (physician) and Advanced Practice Providers (APPs -  Physician Assistants and Nurse Practitioners) who all work together to provide you with the care you need, when you need it.  We recommend signing up for the patient portal called "MyChart".  Sign up information is provided on this After Visit Summary.  MyChart is used to connect with patients for Virtual Visits (Telemedicine).  Patients are able to view lab/test results, encounter notes, upcoming appointments, etc.  Non-urgent messages can be sent to your provider as well.   To learn more about what you can do with MyChart, go to [email or web address].    Your next appointment:   8 month(s)  The format for your next appointment:   In Person  Provider:   You may see [name], MD or one of the following Advanced Practice Providers on your designated Care Team:    Kathyrn Drown, NP    Other Instructions      Signed, [name], MD  10/22/2020 1:02  PM    [facility]

## 2020-10-22 ENCOUNTER — Ambulatory Visit (INDEPENDENT_AMBULATORY_CARE_PROVIDER_SITE_OTHER): Payer: Medicare Other | Admitting: Interventional Cardiology

## 2020-10-22 ENCOUNTER — Encounter: Payer: Self-pay | Admitting: Interventional Cardiology

## 2020-10-22 ENCOUNTER — Other Ambulatory Visit: Payer: Self-pay

## 2020-10-22 VITALS — BP 116/56 | HR 46 | Ht 74.0 in | Wt 172.8 lb

## 2020-10-22 DIAGNOSIS — E782 Mixed hyperlipidemia: Secondary | ICD-10-CM | POA: Diagnosis not present

## 2020-10-22 DIAGNOSIS — Z7189 Other specified counseling: Secondary | ICD-10-CM | POA: Diagnosis not present

## 2020-10-22 DIAGNOSIS — I10 Essential (primary) hypertension: Secondary | ICD-10-CM | POA: Diagnosis not present

## 2020-10-22 DIAGNOSIS — Z7901 Long term (current) use of anticoagulants: Secondary | ICD-10-CM

## 2020-10-22 DIAGNOSIS — Z01818 Encounter for other preprocedural examination: Secondary | ICD-10-CM | POA: Diagnosis not present

## 2020-10-22 DIAGNOSIS — I4819 Other persistent atrial fibrillation: Secondary | ICD-10-CM

## 2020-10-22 NOTE — Patient Instructions (Signed)
Medication Instructions:  Your physician recommends that you continue on your current medications as directed. Please refer to the Current Medication list given to you today.  You may stop your Eliquis 3 days prior to your procedure.  *If you need a refill on your cardiac medications before your next appointment, please call your pharmacy*   Lab Work: None If you have labs (blood work) drawn today and your tests are completely normal, you will receive your results only by: Marland Kitchen MyChart Message (if you have MyChart) OR . A paper copy in the mail If you have any lab test that is abnormal or we need to change your treatment, we will call you to review the results.   Testing/Procedures: None   Follow-Up: At [facility], you and your health needs are our priority.  As part of our continuing mission to provide you with exceptional heart care, we have created designated Provider Care Teams.  These Care Teams include your primary Cardiologist (physician) and Advanced Practice Providers (APPs -  Physician Assistants and Nurse Practitioners) who all work together to provide you with the care you need, when you need it.  We recommend signing up for the patient portal called "MyChart".  Sign up information is provided on this After Visit Summary.  MyChart is used to connect with patients for Virtual Visits (Telemedicine).  Patients are able to view lab/test results, encounter notes, upcoming appointments, etc.  Non-urgent messages can be sent to your provider as well.   To learn more about what you can do with MyChart, go to [email or web address].    Your next appointment:   8 month(s)  The format for your next appointment:   In Person  Provider:   You may see [name], MD or one of the following Advanced Practice Providers on your designated Care Team:    Kathyrn Drown, NP    Other Instructions

## 2020-10-25 DIAGNOSIS — Z20828 Contact with and (suspected) exposure to other viral communicable diseases: Secondary | ICD-10-CM | POA: Diagnosis not present

## 2020-10-25 DIAGNOSIS — Z1159 Encounter for screening for other viral diseases: Secondary | ICD-10-CM | POA: Diagnosis not present

## 2020-10-27 ENCOUNTER — Encounter (HOSPITAL_COMMUNITY): Payer: Self-pay | Admitting: Gastroenterology

## 2020-10-29 ENCOUNTER — Other Ambulatory Visit (HOSPITAL_COMMUNITY)
Admission: RE | Admit: 2020-10-29 | Discharge: 2020-10-29 | Disposition: A | Discharge status: Home / Self Care | Payer: Medicare Other | Source: Ambulatory Visit | Attending: Gastroenterology | Admitting: Gastroenterology

## 2020-10-29 DIAGNOSIS — Z20822 Contact with and (suspected) exposure to covid-19: Secondary | ICD-10-CM | POA: Diagnosis not present

## 2020-10-29 DIAGNOSIS — Z01812 Encounter for preprocedural laboratory examination: Secondary | ICD-10-CM | POA: Diagnosis not present

## 2020-10-29 LAB — SARS CORONAVIRUS 2 (TAT 6-24 HRS): SARS Coronavirus 2: NEGATIVE

## 2020-11-01 DIAGNOSIS — Z20828 Contact with and (suspected) exposure to other viral communicable diseases: Secondary | ICD-10-CM | POA: Diagnosis not present

## 2020-11-01 DIAGNOSIS — Z1159 Encounter for screening for other viral diseases: Secondary | ICD-10-CM | POA: Diagnosis not present

## 2020-11-02 ENCOUNTER — Observation Stay (HOSPITAL_COMMUNITY): Payer: Medicare Other

## 2020-11-02 ENCOUNTER — Other Ambulatory Visit: Payer: Self-pay

## 2020-11-02 ENCOUNTER — Ambulatory Visit (HOSPITAL_COMMUNITY): Payer: Medicare Other | Admitting: Certified Registered Nurse Anesthetist

## 2020-11-02 ENCOUNTER — Observation Stay (HOSPITAL_COMMUNITY)
Admission: RE | Admit: 2020-11-02 | Discharge: 2020-11-03 | Disposition: A | Discharge status: Home / Self Care | Payer: Medicare Other | Attending: Family Medicine | Admitting: Family Medicine

## 2020-11-02 ENCOUNTER — Encounter (HOSPITAL_COMMUNITY): Payer: Self-pay | Admitting: Gastroenterology

## 2020-11-02 ENCOUNTER — Encounter (HOSPITAL_COMMUNITY)
Admission: RE | Disposition: A | Discharge status: Home / Self Care | Payer: Self-pay | Source: Home / Self Care | Attending: Anesthesiology

## 2020-11-02 DIAGNOSIS — Z7901 Long term (current) use of anticoagulants: Secondary | ICD-10-CM

## 2020-11-02 DIAGNOSIS — R1310 Dysphagia, unspecified: Secondary | ICD-10-CM | POA: Diagnosis not present

## 2020-11-02 DIAGNOSIS — I4819 Other persistent atrial fibrillation: Secondary | ICD-10-CM | POA: Diagnosis not present

## 2020-11-02 DIAGNOSIS — K449 Diaphragmatic hernia without obstruction or gangrene: Secondary | ICD-10-CM | POA: Insufficient documentation

## 2020-11-02 DIAGNOSIS — I10 Essential (primary) hypertension: Secondary | ICD-10-CM | POA: Diagnosis not present

## 2020-11-02 DIAGNOSIS — Q399 Congenital malformation of esophagus, unspecified: Secondary | ICD-10-CM | POA: Insufficient documentation

## 2020-11-02 DIAGNOSIS — H353114 Nonexudative age-related macular degeneration, right eye, advanced atrophic with subfoveal involvement: Secondary | ICD-10-CM | POA: Diagnosis not present

## 2020-11-02 DIAGNOSIS — H353124 Nonexudative age-related macular degeneration, left eye, advanced atrophic with subfoveal involvement: Secondary | ICD-10-CM | POA: Diagnosis not present

## 2020-11-02 DIAGNOSIS — Q6432 Congenital stricture of urethra: Secondary | ICD-10-CM | POA: Diagnosis not present

## 2020-11-02 DIAGNOSIS — K2940 Chronic atrophic gastritis without bleeding: Secondary | ICD-10-CM | POA: Insufficient documentation

## 2020-11-02 DIAGNOSIS — R300 Dysuria: Secondary | ICD-10-CM

## 2020-11-02 DIAGNOSIS — D6869 Other thrombophilia: Secondary | ICD-10-CM

## 2020-11-02 DIAGNOSIS — D6859 Other primary thrombophilia: Secondary | ICD-10-CM | POA: Insufficient documentation

## 2020-11-02 DIAGNOSIS — I482 Chronic atrial fibrillation, unspecified: Secondary | ICD-10-CM | POA: Insufficient documentation

## 2020-11-02 DIAGNOSIS — M1990 Unspecified osteoarthritis, unspecified site: Secondary | ICD-10-CM | POA: Diagnosis not present

## 2020-11-02 DIAGNOSIS — I4891 Unspecified atrial fibrillation: Secondary | ICD-10-CM | POA: Diagnosis present

## 2020-11-02 DIAGNOSIS — E785 Hyperlipidemia, unspecified: Secondary | ICD-10-CM | POA: Diagnosis not present

## 2020-11-02 DIAGNOSIS — I517 Cardiomegaly: Secondary | ICD-10-CM | POA: Diagnosis not present

## 2020-11-02 DIAGNOSIS — H353230 Exudative age-related macular degeneration, bilateral, stage unspecified: Secondary | ICD-10-CM | POA: Insufficient documentation

## 2020-11-02 HISTORY — PX: SAVORY DILATION: SHX5439

## 2020-11-02 HISTORY — PX: ESOPHAGOGASTRODUODENOSCOPY (EGD) WITH PROPOFOL: SHX5813

## 2020-11-02 LAB — URINALYSIS, ROUTINE W REFLEX MICROSCOPIC
Bacteria, UA: NONE SEEN
Bilirubin Urine: NEGATIVE
Glucose, UA: NEGATIVE mg/dL
Hgb urine dipstick: NEGATIVE
Ketones, ur: NEGATIVE mg/dL
Leukocytes,Ua: NEGATIVE
Nitrite: NEGATIVE
Protein, ur: NEGATIVE mg/dL
Specific Gravity, Urine: 1.002 — ABNORMAL LOW (ref 1.005–1.030)
pH: 6 (ref 5.0–8.0)

## 2020-11-02 LAB — CBC WITH DIFFERENTIAL/PLATELET
Abs Immature Granulocytes: 0.02 10*3/uL (ref 0.00–0.07)
Basophils Absolute: 0.1 10*3/uL (ref 0.0–0.1)
Basophils Relative: 1 %
Eosinophils Absolute: 0.1 10*3/uL (ref 0.0–0.5)
Eosinophils Relative: 1 %
HCT: 44 % (ref 39.0–52.0)
Hemoglobin: 14.2 g/dL (ref 13.0–17.0)
Immature Granulocytes: 0 %
Lymphocytes Relative: 23 %
Lymphs Abs: 2.3 10*3/uL (ref 0.7–4.0)
MCH: 31.1 pg (ref 26.0–34.0)
MCHC: 32.3 g/dL (ref 30.0–36.0)
MCV: 96.5 fL (ref 80.0–100.0)
Monocytes Absolute: 0.6 10*3/uL (ref 0.1–1.0)
Monocytes Relative: 6 %
Neutro Abs: 6.8 10*3/uL (ref 1.7–7.7)
Neutrophils Relative %: 69 %
Platelets: 180 10*3/uL (ref 150–400)
RBC: 4.56 MIL/uL (ref 4.22–5.81)
RDW: 14.1 % (ref 11.5–15.5)
WBC: 9.9 10*3/uL (ref 4.0–10.5)
nRBC: 0 % (ref 0.0–0.2)

## 2020-11-02 LAB — PHOSPHORUS: Phosphorus: 2.9 mg/dL (ref 2.5–4.6)

## 2020-11-02 LAB — URINALYSIS, COMPLETE (UACMP) WITH MICROSCOPIC
Bacteria, UA: NONE SEEN
Bilirubin Urine: NEGATIVE
Glucose, UA: NEGATIVE mg/dL
Hgb urine dipstick: NEGATIVE
Ketones, ur: 5 mg/dL — AB
Leukocytes,Ua: NEGATIVE
Nitrite: NEGATIVE
Protein, ur: NEGATIVE mg/dL
Specific Gravity, Urine: 1.005 (ref 1.005–1.030)
pH: 8 (ref 5.0–8.0)

## 2020-11-02 LAB — COMPREHENSIVE METABOLIC PANEL
ALT: 14 U/L (ref 0–44)
AST: 21 U/L (ref 15–41)
Albumin: 3.9 g/dL (ref 3.5–5.0)
Alkaline Phosphatase: 49 U/L (ref 38–126)
Anion gap: 8 (ref 5–15)
BUN: 14 mg/dL (ref 8–23)
CO2: 30 mmol/L (ref 22–32)
Calcium: 9.5 mg/dL (ref 8.9–10.3)
Chloride: 105 mmol/L (ref 98–111)
Creatinine, Ser: 0.88 mg/dL (ref 0.61–1.24)
GFR, Estimated: >60 mL/min (ref >60)
Glucose, Bld: 105 mg/dL — ABNORMAL HIGH (ref 70–99)
Potassium: 4 mmol/L (ref 3.5–5.1)
Sodium: 143 mmol/L (ref 135–145)
Total Bilirubin: 1.2 mg/dL (ref 0.3–1.2)
Total Protein: 6.4 g/dL — ABNORMAL LOW (ref 6.5–8.1)

## 2020-11-02 LAB — MAGNESIUM: Magnesium: 1.9 mg/dL (ref 1.7–2.4)

## 2020-11-02 LAB — TSH: TSH: 1.38 u[IU]/mL (ref 0.350–4.500)

## 2020-11-02 SURGERY — ESOPHAGOGASTRODUODENOSCOPY (EGD) WITH PROPOFOL
Anesthesia: Monitor Anesthesia Care

## 2020-11-02 MED ORDER — HYDRALAZINE HCL 20 MG/ML IJ SOLN
10.0000 mg | Freq: Four times a day (QID) | INTRAMUSCULAR | Status: DC | PRN
  Filled 2020-11-02: qty 1

## 2020-11-02 MED ORDER — BISACODYL 10 MG RE SUPP
10.0000 mg | Freq: Once | RECTAL | Status: AC
  Administered 2020-11-03: 10 mg via RECTAL
  Filled 2020-11-02: qty 1

## 2020-11-02 MED ORDER — NITROGLYCERIN IN D5W 200-5 MCG/ML-% IV SOLN
INTRAVENOUS | Status: AC
  Filled 2020-11-02: qty 250

## 2020-11-02 MED ORDER — ONDANSETRON HCL 4 MG/2ML IJ SOLN
4.0000 mg | Freq: Four times a day (QID) | INTRAMUSCULAR | Status: DC | PRN
Start: 2020-11-02 — End: 2020-11-03

## 2020-11-02 MED ORDER — PROPOFOL 10 MG/ML IV BOLUS
INTRAVENOUS | Status: DC | PRN
  Administered 2020-11-02: 10 mg via INTRAVENOUS
  Administered 2020-11-02: 20 mg via INTRAVENOUS

## 2020-11-02 MED ORDER — ONDANSETRON HCL 4 MG PO TABS: 4.0000 mg | ORAL_TABLET | Freq: Four times a day (QID) | ORAL | Status: DC | PRN

## 2020-11-02 MED ORDER — LACTATED RINGERS IV SOLN
INTRAVENOUS | Status: DC
  Administered 2020-11-02: 1000 mL via INTRAVENOUS

## 2020-11-02 MED ORDER — PROPOFOL 500 MG/50ML IV EMUL
INTRAVENOUS | Status: DC | PRN
  Administered 2020-11-02: 100 ug/kg/min via INTRAVENOUS

## 2020-11-02 MED ORDER — APIXABAN 5 MG PO TABS
5.0000 mg | ORAL_TABLET | Freq: Two times a day (BID) | ORAL | Status: DC
  Administered 2020-11-03: 5 mg via ORAL
  Filled 2020-11-02: qty 1

## 2020-11-02 MED ORDER — PROPOFOL 10 MG/ML IV BOLUS
INTRAVENOUS | Status: AC
  Filled 2020-11-02: qty 20

## 2020-11-02 MED ORDER — HYDRALAZINE HCL 20 MG/ML IJ SOLN
INTRAMUSCULAR | Status: AC
  Filled 2020-11-02: qty 1

## 2020-11-02 MED ORDER — ACETAMINOPHEN 650 MG RE SUPP
650.0000 mg | Freq: Four times a day (QID) | RECTAL | Status: DC | PRN
Start: 2020-11-02 — End: 2020-11-03

## 2020-11-02 MED ORDER — PROPOFOL 500 MG/50ML IV EMUL
INTRAVENOUS | Status: AC
  Filled 2020-11-02: qty 50

## 2020-11-02 MED ORDER — ACETAMINOPHEN 325 MG PO TABS: 650.0000 mg | ORAL_TABLET | Freq: Four times a day (QID) | ORAL | Status: DC | PRN

## 2020-11-02 MED ORDER — PROPOFOL 1000 MG/100ML IV EMUL
INTRAVENOUS | Status: AC
  Filled 2020-11-02: qty 100

## 2020-11-02 MED ORDER — LIDOCAINE 2% (20 MG/ML) 5 ML SYRINGE
INTRAMUSCULAR | Status: DC | PRN
  Administered 2020-11-02: 40 mg via INTRAVENOUS

## 2020-11-02 MED ORDER — ONDANSETRON HCL 4 MG/2ML IJ SOLN
INTRAMUSCULAR | Status: DC | PRN
  Administered 2020-11-02: 4 mg via INTRAVENOUS

## 2020-11-02 MED ORDER — ENOXAPARIN SODIUM 40 MG/0.4ML ~~LOC~~ SOLN: 40.0000 mg | SUBCUTANEOUS | Status: DC

## 2020-11-02 MED ORDER — AMLODIPINE BESYLATE 5 MG PO TABS
5.0000 mg | ORAL_TABLET | Freq: Every day | ORAL | Status: DC
  Administered 2020-11-03: 5 mg via ORAL
  Filled 2020-11-02: qty 1

## 2020-11-02 MED ORDER — HYDRALAZINE HCL 20 MG/ML IJ SOLN
10.0000 mg | Freq: Once | INTRAMUSCULAR | Status: AC
  Administered 2020-11-02 (×2): 10 mg via INTRAVENOUS

## 2020-11-02 MED ORDER — CLEVIDIPINE BUTYRATE 0.5 MG/ML IV EMUL
0.0000 mg/h | INTRAVENOUS | Status: DC
  Filled 2020-11-02: qty 50

## 2020-11-02 MED ORDER — NITROGLYCERIN 0.2 MG/ML ON CALL CATH LAB
INTRAVENOUS | Status: DC | PRN
  Administered 2020-11-02: 20 ug via INTRAVENOUS

## 2020-11-02 SURGICAL SUPPLY — 15 items

## 2020-11-02 NOTE — H&P (Signed)
History and Physical    AUTHER LYERLY TKW:[id number] DOB: [birth year] DOA: 11/02/2020  PCP: [name], MD   Patient coming from: Direct Admission from Endoscopy Suite   Chief Complaint: Elevated BP, Urinary Burning   HPI: Justin Potter is a 87 y.o. male with medical history significant for but not limited too a history of hypertension, atrial fibrillation on anticoagulation history of bradycardia, laser prostate cancer status post radioactive seed implants, history of TIA, anxiety, arthritis, hyperlipidemia, macular degeneration, history of urethral stricture and urinary retention as well as other comorbidities who presented today to the [facility] endoscopy suite for elective esophageal dilation given his dysphagia.  Upon arrival to the endoscopy suite his blood pressure was significantly elevated 240/128.  He was given IV hydralazine and blood pressure was brought down with pharmacological agents and he proceeded with the dilation which went fairly well.  He was to be discharged Home but his BP was still mildly elevated Post dilation and the GI physician asked if the patient be observed overnight given his elevated blood pressure as family was concerned about his blood pressure going up.  Family has been noticing his blood pressure trend upwards last few weeks.  Patient denies chest pain, shortness breath, lightheadedness or dizziness and his only complaint currently is urinary burning.  Patient was on amlodipine but got taken off of it a few years ago.  Patient's son takes his patient's blood pressure 3 times a week given his A. fib and is noticing a trend upward in the blood pressure.  TR[facility] was asked to admit this patient for observation at the request of the [facility] Gastroenterology team to evaluate his blood pressure overnight with discharging home in the morning.  ED Course: No ED course this patient was directly admitted from the endoscopy suite  Review of Systems: As per HPI otherwise  all other systems reviewed and negative.   Past Medical History:  Diagnosis Date  . Anxiety   . Arthritis    "in the fingers" per pt  . Atrial fibrillation ([place])   . Bladder neck contracture   . Dysrhythmia    A-FIB / HX BRADYCARDIA - FOLLOWED BY DR. DeWitt  . Gross hematuria   . History of gout   . History of prostate cancer    S/P RADIOACTIVE SEED IMPLANTS  . History of squamous cell carcinoma of skin    EXCISION LOWER LIP AND RIGHT EAR  . History of TIA (transient ischemic attack)    probable tia no residual  09-09-2012  . Hyperlipidemia   . Hypertension   . Macular degeneration    both eyes  . Urethral stricture   . Urinary retention    Past Surgical History:  Procedure Laterality Date  . APPENDECTOMY    . CATARACT EXTRACTION W/ INTRAOCULAR LENS  IMPLANT, BILATERAL    . CHOLECYSTECTOMY  1977  . CYSTOSCOPY WITH URETHRAL DILATATION N/A 02/12/2013   Procedure: CYSTOSCOPY WITH BALLOON DILATATION OF URETHRAL STRICTURE AND BLADDER FULGERATION;  Surgeon: Bernestine Amass, MD;  Location: [facility] ORS;  Service: Urology;  Laterality: N/A;  . LAPAROSCOPIC APPENDECTOMY N/A 08/22/2013   Procedure: APPENDECTOMY LAPAROSCOPIC;  Surgeon: Pedro Earls, MD;  Location: [facility] ORS;  Service: General;  Laterality: N/A;  . [place]  . TONSILLECTOMY  as child  . TOTAL KNEE ARTHROPLASTY Right 11-23-2004   SOCIAL HISTORY  reports that he quit smoking about 41 years ago. His  smoking use included cigarettes. He has a 52.50 pack-year smoking history. He has never used smokeless tobacco. He reports current alcohol use. He reports that he does not use drugs.  ALLERGIES No Known Allergies  Family History  Problem Relation Age of Onset  . Cancer Mother   . Cancer Father    Prior to Admission medications   Medication Sig Start Date End Date Taking? Authorizing Provider  acetaminophen (TYLENOL) 500 MG tablet Take 500 mg by mouth daily  as needed (pain).   Yes [provider]  ELIQUIS 5 MG TABS tablet Take 1 tablet (5 mg total) by mouth 2 (two) times daily. 08/18/20  Yes Belva Crome, MD  Multiple Vitamins-Minerals (OCUVITE PRESERVISION PO) Take 1 tablet by mouth every morning.    Yes [provider]   Physical Exam: Vitals:   11/02/20 1645 11/02/20 1657 11/02/20 1700 11/02/20 1710  BP: (!) 167/80 (!) 147/83 (!) 165/96 121/82  Pulse:      Resp: (!) 21 (!) 23 (!) 27 (!) 22  Temp:      TempSrc:      SpO2:      Weight:      Height:       Constitutional: WN/WD elderly Caucasian male in NAD  Eyes: Lids and conjunctivae normal, sclerae anicteric  ENMT: External Ears, Nose appear normal. Grossly normal hearing.  Neck: Appears normal, supple, no cervical masses, normal ROM, no appreciable thyromegaly; no JVD Respiratory: Slightly diminished to auscultation bilaterally, no wheezing, rales, rhonchi or crackles. Normal respiratory effort and patient is not tachypenic. No accessory muscle use.  Cardiovascular: RRR, no murmurs / rubs / gallops. S1 and S2 auscultated. Trace LE Edema Abdomen: Soft, non-tender, non-distended. Bowel sounds positive.  GU: Deferred. Musculoskeletal: No clubbing / cyanosis of digits/nails. No joint deformity upper and lower extremities.  Skin: No rashes, lesions, ulcers on a limited skin evaluation. No induration; Warm and dry.  Neurologic: CN 2-12 grossly intact with no focal deficits. Romberg sign and cerebellar reflexes not assessed.  Psychiatric: Normal judgment and insight. Alert and oriented x 3. Normal mood and appropriate affect.   Labs on Admission: I have personally reviewed following labs and imaging studies  CBC: No results for input(s): WBC, NEUTROABS, HGB, HCT, MCV, PLT in the last 168 hours. Basic Metabolic Panel: No results for input(s): NA, K, CL, CO2, GLUCOSE, BUN, CREATININE, CALCIUM, MG, PHOS in the last 168 hours. GFR: CrCl cannot be calculated (Patient's most  recent lab result is older than the maximum 21 days allowed.). Liver Function Tests: No results for input(s): AST, ALT, ALKPHOS, BILITOT, PROT, ALBUMIN in the last 168 hours. No results for input(s): LIPASE, AMYLASE in the last 168 hours. No results for input(s): AMMONIA in the last 168 hours. Coagulation Profile: No results for input(s): INR, PROTIME in the last 168 hours. Cardiac Enzymes: No results for input(s): CKTOTAL, CKMB, CKMBINDEX, TROPONINI in the last 168 hours. BNP (last 3 results) No results for input(s): PROBNP in the last 8760 hours. HbA1C: No results for input(s): HGBA1C in the last 72 hours. CBG: No results for input(s): GLUCAP in the last 168 hours. Lipid Profile: No results for input(s): CHOL, HDL, LDLCALC, TRIG, CHOLHDL, LDLDIRECT in the last 72 hours. Thyroid Function Tests: No results for input(s): TSH, T4TOTAL, FREET4, T3FREE, THYROIDAB in the last 72 hours. Anemia Panel: No results for input(s): VITAMINB12, FOLATE, FERRITIN, TIBC, IRON, RETICCTPCT in the last 72 hours. Urine analysis:    Component Value Date/Time   COLORURINE  AMBER (A) 08/20/2013 1632   APPEARANCEUR CLEAR 08/20/2013 1632   LABSPEC 1.021 08/20/2013 1632   PHURINE 5.5 08/20/2013 1632   GLUCOSEU NEGATIVE 08/20/2013 1632   HGBUR NEGATIVE 08/20/2013 1632   BILIRUBINUR NEGATIVE 08/20/2013 1632   KETONESUR 15 (A) 08/20/2013 1632   PROTEINUR NEGATIVE 08/20/2013 1632   UROBILINOGEN 0.2 08/20/2013 1632   NITRITE NEGATIVE 08/20/2013 1632   LEUKOCYTESUR NEGATIVE 08/20/2013 1632   Sepsis Labs: !!!!!!!!!!!!!!!!!!!!!!!!!!!!!!!!!!!!!!!!!!!! @LABRCNTIP (procalcitonin:4,lacticidven:4) ) Recent Results (from the past 240 hour(s))  SARS CORONAVIRUS 2 (TAT 6-24 HRS) Nasopharyngeal Nasopharyngeal Swab     Status: None   Collection Time: 10/29/20 12:31 PM   Specimen: Nasopharyngeal Swab  Result Value Ref Range Status   SARS Coronavirus 2 NEGATIVE NEGATIVE Final    Comment: (NOTE) SARS-CoV-2 target  nucleic acids are NOT DETECTED.  The SARS-CoV-2 RNA is generally detectable in upper and lower respiratory specimens during the acute phase of infection. Negative results do not preclude SARS-CoV-2 infection, do not rule out co-infections with other pathogens, and should not be used as the sole basis for treatment or other patient management decisions. Negative results must be combined with clinical observations, patient history, and epidemiological information. The expected result is Negative.  Fact Sheet for Patients: SugarRoll.be  Fact Sheet for Healthcare Providers: https://www.woods-mathews.com/  This test is not yet approved or cleared by the [place] FDA and  has been authorized for detection and/or diagnosis of SARS-CoV-2 by FDA under an Emergency Use Authorization (EUA). This EUA will remain  in effect (meaning this test can be used) for the duration of the COVID-19 declaration under Se ction 564(b)(1) of the Act, 21 U.S.C. section 360bbb-3(b)(1), unless the authorization is terminated or revoked sooner.  Performed at [facility], [place] [address], [place], [place] [address]     Radiological Exams on Admission: No results found.   EGD Done by Dr. Watt Climes: Findings:      A small hiatal hernia was present.      The examined esophagus was mildly tortuous and some mild distal       esophageal narrowing without ring or stricture. But based on his       dysphagia and being helped by dilation in the past after the endoscopy       was completed we elected to place A guidewire which was placed and the       scope was withdrawn. Dilation was performed with a Savary dilator with       no resistance and no heme at 15 mm. The dilation site was examined       following endoscope reinsertion and showed no change and no obvious       complication bleeding etc.      Diffuse mild inflammation characterized by congestion (edema),  erythema       and granularity was found in the entire examined stomach.      The duodenal bulb, first portion of the duodenum and second portion of       the duodenum were normal.      The exam was otherwise without abnormality. Impression:               - Small hiatal hernia.                           - Tortuous esophagus with mild distal narrowing but  no obvious stricture status post dilation.                           - Chronic atrophic gastritis.                           - Normal duodenal bulb, first portion of the                            duodenum and second portion of the duodenum.                           - The examination was otherwise normal.                           - No specimens collected. Moderate Sedation:      Not Applicable - Patient had care per Anesthesia. Recommendation:           - Patient has a contact number available for                            emergencies. The signs and symptoms of potential                            delayed complications were discussed with the                            patient. Return to normal activities tomorrow.                            Written discharge instructions were provided to the                            patient.                           - Soft diet today.                           - Continue present medications.                           - Resume Eliquis (apixaban) at prior dose tomorrow.                           - Return to GI clinic PRN.                           - Telephone GI clinic if symptomatic PRN.  EKG: Not done in the Endoscopy Suite so will order one for Admission  Assessment/Plan Principal Problem:   Hypertension Active Problems:   Atrial fibrillation (HCC)   Arthritis   Long term current use of anticoagulant therapy   Advanced nonexudative age-related macular degeneration of left eye with subfoveal involvement   Advanced nonexudative age-related macular degeneration of  right eye with subfoveal involvement   Dysphagia   Dysuria   Acquired thrombophilia ([place])  Accelerated HTN -BP on Admission was 240/128 -Given IV Hydralazine and improved -Last BP was 153/79 -Continue to Monitor BP per Protocol -C/w IV Hydralazine 10 mg q6hprn for SBP>160 or DBP>100 -Had been taken off of Amlodipine in the past; Will resume tomorrow AM at 5 mg po Daily as he cannot tolerate BB -OF note No COVID Testing was done  On admission and was Negative 10/29/20 so will order one and check Baseline CXR  Dysphagia s/p Esophageal Dilation -Per GI  -C/w Soft Diet -Check CXR -Resume Apixaban  -Further care per GI   Chronic Atrial Fibrillation -Unable to tolerate BB due to Bradycardia -Resume Apixaban in the AM  -CHAD2S2-VASc of at least 3 -Order Admission EKG  -C/w Telemetry Montioring; Currently Rate Controlled  Acquired Thrombophilia -In the Setting of Atrial Fib -Resume Apixaban in the AM  Urethral Burning/Dysuria  -Check U/A and Urine Cx -Has a Uretheral Stricture but does not self Cath -Check Bladder Scan  Macular Degeneration of Both Eyes -Takes Occuvite -Resume now Esophagus is dilated  DVT prophylaxis: Resume Eliquis in the AM  Code Status: DO NOT RESUSCITATE  Family Communication: Discussed with Son Dr. Mardene Speak. at bedside  Disposition Plan: Anticipate D/C home in the AM Consults called: None Admission status: Obs Telemetry   Severity of Illness: The appropriate patient status for this patient is OBSERVATION. Observation status is judged to be reasonable and necessary in order to provide the required intensity of service to ensure the patient's safety. The patient's presenting symptoms, physical exam findings, and initial radiographic and laboratory data in the context of their medical condition is felt to place them at decreased risk for further clinical deterioration. Furthermore, it is anticipated that the patient will be medically stable for  discharge from the hospital within 2 midnights of admission. The following factors support the patient status of observation.   " The patient's presenting symptoms include Elevated BP. " The physical exam findings are relatively unremarkable. " The initial radiographic and laboratory data are pending  Kerney Elbe, D.O. [facility] is on [place]  If 7PM-7AM, please contact night-coverage [email or web address]  11/02/2020, 6:18 PM

## 2020-11-02 NOTE — Progress Notes (Signed)
Pt's BP now near baseline (latest BP 153/79). Dr. Eligha Bridegroom aware of vitals at approximately 1700. Report given to Nelia Shi, RN. Pt exits my care.

## 2020-11-02 NOTE — Progress Notes (Signed)
Pt's BP elevated. BP noted to be elevated in pre-procedure. Pt denies having a hx of HTN. Sts he does not take anti-hypertensives at home. Denies chest pain, HA, dizziness, n/v, SHOB or any other adverse effects at this time. Pt has been in Afib since arriving to Endo dept. Dr. Luane School made aware. VO obt for IV Hydralazine. Pt medicated a/o. Report off to [employer]. Exits my care.

## 2020-11-02 NOTE — Transfer of Care (Signed)
Immediate Anesthesia Transfer of Care Note  Patient: Justin Potter  Procedure(s) Performed: ESOPHAGOGASTRODUODENOSCOPY (EGD) WITH PROPOFOL (N/A ) SAVORY DILATION (N/A )  Patient Location: PACU and Endoscopy Unit  Anesthesia Type:MAC  Level of Consciousness: awake, alert  and oriented  Airway & Oxygen Therapy: Patient Spontanous Breathing and Patient connected to face mask oxygen  Post-op Assessment: Report given to RN and Post -op Vital signs reviewed and stable  Post vital signs: Reviewed and stable  Last Vitals:  Vitals Value Taken Time  BP    Temp    Pulse    Resp 34 11/02/20 1503  SpO2    Vitals shown include unvalidated device data.  Last Pain:  Vitals:   11/02/20 1349  TempSrc: Oral  PainSc: 0-No pain         Complications: No complications documented.

## 2020-11-02 NOTE — Discharge Instructions (Signed)
Begin with soft solids as long as liquids are going down fine and if doing well tomorrow may resume your Eliquis and call me if question or problem or if swallowing is no better and follow-up in the office as needed  YOU HAD AN ENDOSCOPIC PROCEDURE TODAY: Refer to the procedure report and other information in the discharge instructions given to you for any specific questions about what was found during the examination. If this information does not answer your questions, please call [facility] office at [phone] to clarify.   YOU SHOULD EXPECT: Some feelings of bloating in the abdomen. Passage of more gas than usual. Walking can help get rid of the air that was put into your GI tract during the procedure and reduce the bloating. If you had a lower endoscopy (such as a colonoscopy or flexible sigmoidoscopy) you may notice spotting of blood in your stool or on the toilet paper. Some abdominal soreness may be present for a day or two, also.  DIET: Your first meal following the procedure should be a light meal and then it is ok to progress to your normal diet. A half-sandwich or bowl of soup is an example of a good first meal. Heavy or fried foods are harder to digest and may make you feel nauseous or bloated. Drink plenty of fluids but you should avoid alcoholic beverages for 24 hours. If you had a esophageal dilation, please see attached instructions for diet.    ACTIVITY: Your care partner should take you home directly after the procedure. You should plan to take it easy, moving slowly for the rest of the day. You can resume normal activity the day after the procedure however YOU SHOULD NOT DRIVE, use power tools, machinery or perform tasks that involve climbing or major physical exertion for 24 hours (because of the sedation medicines used during the test).   SYMPTOMS TO REPORT IMMEDIATELY: A gastroenterologist can be reached at any hour. Please call [phone]  for any of the following symptoms:    Following upper endoscopy (EGD, EUS, ERCP, esophageal dilation) Vomiting of blood or coffee ground material  New, significant abdominal pain  New, significant chest pain or pain under the shoulder blades  Painful or persistently difficult swallowing  New shortness of breath  Black, tarry-looking or red, bloody stools  FOLLOW UP:  If any biopsies were taken you will be contacted by phone or by letter within the next 1-3 weeks. Call [phone]  if you have not heard about the biopsies in 3 weeks.  Please also call with any specific questions about appointments or follow up tests. YOU HAD AN ENDOSCOPIC PROCEDURE TODAY: Refer to the procedure report and other information in the discharge instructions given to you for any specific questions about what was found during the examination. If this information does not answer your questions, please call [facility] office at [phone] to clarify.   YOU SHOULD EXPECT: Some feelings of bloating in the abdomen. Passage of more gas than usual. Walking can help get rid of the air that was put into your GI tract during the procedure and reduce the bloating. If you had a lower endoscopy (such as a colonoscopy or flexible sigmoidoscopy) you may notice spotting of blood in your stool or on the toilet paper. Some abdominal soreness may be present for a day or two, also.  DIET: Your first meal following the procedure should be a light meal and then it is ok to progress to your normal diet. A  half-sandwich or bowl of soup is an example of a good first meal. Heavy or fried foods are harder to digest and may make you feel nauseous or bloated. Drink plenty of fluids but you should avoid alcoholic beverages for 24 hours. If you had a esophageal dilation, please see attached instructions for diet.    ACTIVITY: Your care partner should take you home directly after the procedure. You should plan to take it easy, moving slowly for the rest of the day. You can resume normal  activity the day after the procedure however YOU SHOULD NOT DRIVE, use power tools, machinery or perform tasks that involve climbing or major physical exertion for 24 hours (because of the sedation medicines used during the test).   SYMPTOMS TO REPORT IMMEDIATELY: A gastroenterologist can be reached at any hour. Please call [phone]  for any of the following symptoms:  Following upper endoscopy (EGD, EUS, ERCP, esophageal dilation) Vomiting of blood or coffee ground material  New, significant abdominal pain  New, significant chest pain or pain under the shoulder blades  Painful or persistently difficult swallowing  New shortness of breath  Black, tarry-looking or red, bloody stools  FOLLOW UP:  If any biopsies were taken you will be contacted by phone or by letter within the next 1-3 weeks. Call [phone]  if you have not heard about the biopsies in 3 weeks.  Please also call with any specific questions about appointments or follow up tests.

## 2020-11-02 NOTE — Anesthesia Procedure Notes (Signed)
Procedure Name: MAC Date/Time: 11/02/2020 2:41 PM Performed by: Maxwell Caul, CRNA Pre-anesthesia Checklist: Patient identified, Emergency Drugs available, Suction available and Patient being monitored Oxygen Delivery Method: Simple face mask

## 2020-11-02 NOTE — Progress Notes (Signed)
Patient transferred from EGD to 1426. Patient is alert and oriented to person, place, time, and situation. Patient vitals taken upon arrival,inital BP 170/87, Md made aware. Telemetry monitoring enabled. Patient oriented to the room and fall precautions initiated.  Patient has call bell within reach, bed alarm on, and knows to call for assistance prior to getting up. Patient demonstrates use of the call bell. IVs assessed for patency. Patient is resting comfortably in bed, NAD.

## 2020-11-02 NOTE — Progress Notes (Signed)
Justin Potter 1:26 PM  Subjective: Patient doing well and no new problems since he was seen recently in the office and his swallowing has been about the same and he has no new complaints and he last took his blood thinner 3 days ago  Objective: Vital signs stable afebrile elderly pleasant no acute distress exam please see preassessment evaluation  Assessment: Dysphagia inpatient help with dilation in the past  Plan: Okay to proceed with EGD and probable dilation with anesthesia assistance  [facility]  office [phone] After 5PM or if no answer call [phone]

## 2020-11-02 NOTE — Op Note (Signed)
[facility] Patient Name: Justin Potter Procedure Date: 11/02/2020 MRN: [id number] Attending MD: Clarene Essex , MD Date of Birth: 1925-12-22 CSN: [id number] Age: 87 Admit Type: Inpatient Procedure:                Upper GI endoscopy Indications:              Dysphagia Providers:                Clarene Essex, MD, Benetta Spar, Technician,                            Nelia Shi, RN Referring MD:              Medicines:                Propofol total dose 140 mg IV, Ondansetron 4 mg IV,                            40 mg IV lidocaine Complications:            No immediate complications. Estimated Blood Loss:     Estimated blood loss: none. Procedure:                Pre-Anesthesia Assessment:                           - Prior to the procedure, a History and Physical                            was performed, and patient medications and                            allergies were reviewed. The patient's tolerance of                            previous anesthesia was also reviewed. The risks                            and benefits of the procedure and the sedation                            options and risks were discussed with the patient.                            All questions were answered, and informed consent                            was obtained. Prior Anticoagulants: The patient has                            taken Eliquis (apixaban), last dose was 3 days                            prior to procedure. ASA Grade Assessment: III - A  patient with severe systemic disease. After                            reviewing the risks and benefits, the patient was                            deemed in satisfactory condition to undergo the                            procedure.                           After obtaining informed consent, the endoscope was                            passed under direct vision. Throughout the                             procedure, the patient's blood pressure, pulse, and                            oxygen saturations were monitored continuously. The                            [id number]-H190 ([id number]) Olympus gastroscope was                            introduced through the mouth, and advanced to the                            second part of duodenum. The upper GI endoscopy was                            accomplished without difficulty. The patient                            tolerated the procedure well. Scope In: Scope Out: Findings:      A small hiatal hernia was present.      The examined esophagus was mildly tortuous and some mild distal       esophageal narrowing without ring or stricture. But based on his       dysphagia and being helped by dilation in the past after the endoscopy       was completed we elected to place A guidewire which was placed and the       scope was withdrawn. Dilation was performed with a Savary dilator with       no resistance and no heme at 15 mm. The dilation site was examined       following endoscope reinsertion and showed no change and no obvious       complication bleeding etc.      Diffuse mild inflammation characterized by congestion (edema), erythema       and granularity was found in the entire examined stomach.      The duodenal bulb, first portion of the duodenum and second portion of       the  duodenum were normal.      The exam was otherwise without abnormality. Impression:               - Small hiatal hernia.                           - Tortuous esophagus with mild distal narrowing but                            no obvious stricture status post dilation.                           - Chronic atrophic gastritis.                           - Normal duodenal bulb, first portion of the                            duodenum and second portion of the duodenum.                           - The examination was otherwise normal.                           - No specimens  collected. Moderate Sedation:      Not Applicable - Patient had care per Anesthesia. Recommendation:           - Patient has a contact number available for                            emergencies. The signs and symptoms of potential                            delayed complications were discussed with the                            patient. Return to normal activities tomorrow.                            Written discharge instructions were provided to the                            patient.                           - Soft diet today.                           - Continue present medications.                           - Resume Eliquis (apixaban) at prior dose tomorrow.                           - Return to GI clinic PRN.                           -  Telephone GI clinic if symptomatic PRN. Procedure Code(s):        --- Professional ---                           [phone], Esophagogastroduodenoscopy, flexible,                            transoral; with insertion of guide wire followed by                            passage of dilator(s) through esophagus over guide                            wire Diagnosis Code(s):        --- Professional ---                           K44.9, Diaphragmatic hernia without obstruction or                            gangrene                           Q39.9, Congenital malformation of esophagus,                            unspecified                           K29.40, Chronic atrophic gastritis without bleeding                           R13.10, Dysphagia, unspecified CPT copyright 2019 American Medical Association. All rights reserved. The codes documented in this report are preliminary and upon coder review may  be revised to meet current compliance requirements. Clarene Essex, MD 11/02/2020 3:07:54 PM This report has been signed electronically. Number of Addenda: 0

## 2020-11-02 NOTE — Anesthesia Preprocedure Evaluation (Addendum)
Anesthesia Evaluation  Patient identified by MRN, date of birth, ID band Patient awake    Reviewed: Allergy & Precautions, NPO status , Patient's Chart, lab work & pertinent test results  Airway Mallampati: III  TM Distance: >3 FB Neck ROM: Full    Dental no notable dental hx.    Pulmonary former smoker,    Pulmonary exam normal breath sounds clear to auscultation       Cardiovascular hypertension, Normal cardiovascular exam+ dysrhythmias Atrial Fibrillation  Rhythm:Regular Rate:Normal  ECG: a-fib   Neuro/Psych Anxiety TIA   GI/Hepatic negative GI ROS, Neg liver ROS,   Endo/Other  negative endocrine ROS  Renal/GU negative Renal ROS     Musculoskeletal  (+) Arthritis , Gout   Abdominal   Peds  Hematology HLD   Anesthesia Other Findings dysphagia  Reproductive/Obstetrics                            Anesthesia Physical Anesthesia Plan  ASA: III  Anesthesia Plan: MAC   Post-op Pain Management:    Induction: Intravenous  PONV Risk Score and Plan: 1 and Propofol infusion and Treatment may vary due to age or medical condition  Airway Management Planned: Nasal Cannula  Additional Equipment:   Intra-op Plan:   Post-operative Plan:   Informed Consent: I have reviewed the patients History and Physical, chart, labs and discussed the procedure including the risks, benefits and alternatives for the proposed anesthesia with the patient or authorized representative who has indicated his/her understanding and acceptance.     Dental advisory given  Plan Discussed with: CRNA  Anesthesia Plan Comments:        Anesthesia Quick Evaluation

## 2020-11-02 NOTE — Progress Notes (Signed)
Pt enters my care after report from Rosana Fret, RN. Pt's BP continues to be markedly elevated (240/128 at time of transfer). Denies CP, HA, dizziness, n/v, SHOB, or other adverse effects. Pt's heart rhythm remains AFIB. Dr. Adele Barthel made aware by phone. Received VO for 10 mg IV hydralazine. Pt medicated as ordered. Dr. Roanna Banning at bedside to assess at 1628. Dr. Watt Climes made aware.

## 2020-11-03 ENCOUNTER — Encounter (HOSPITAL_COMMUNITY): Payer: Self-pay | Admitting: Gastroenterology

## 2020-11-03 DIAGNOSIS — M1990 Unspecified osteoarthritis, unspecified site: Secondary | ICD-10-CM | POA: Diagnosis not present

## 2020-11-03 DIAGNOSIS — R1310 Dysphagia, unspecified: Secondary | ICD-10-CM | POA: Diagnosis not present

## 2020-11-03 DIAGNOSIS — Z7901 Long term (current) use of anticoagulants: Secondary | ICD-10-CM | POA: Diagnosis not present

## 2020-11-03 DIAGNOSIS — R300 Dysuria: Secondary | ICD-10-CM | POA: Diagnosis not present

## 2020-11-03 DIAGNOSIS — D6869 Other thrombophilia: Secondary | ICD-10-CM | POA: Diagnosis not present

## 2020-11-03 DIAGNOSIS — H353124 Nonexudative age-related macular degeneration, left eye, advanced atrophic with subfoveal involvement: Secondary | ICD-10-CM | POA: Diagnosis not present

## 2020-11-03 DIAGNOSIS — Q6432 Congenital stricture of urethra: Secondary | ICD-10-CM | POA: Diagnosis not present

## 2020-11-03 DIAGNOSIS — I10 Essential (primary) hypertension: Secondary | ICD-10-CM | POA: Diagnosis not present

## 2020-11-03 DIAGNOSIS — K2940 Chronic atrophic gastritis without bleeding: Secondary | ICD-10-CM | POA: Diagnosis not present

## 2020-11-03 DIAGNOSIS — H353114 Nonexudative age-related macular degeneration, right eye, advanced atrophic with subfoveal involvement: Secondary | ICD-10-CM | POA: Diagnosis not present

## 2020-11-03 DIAGNOSIS — K449 Diaphragmatic hernia without obstruction or gangrene: Secondary | ICD-10-CM | POA: Diagnosis not present

## 2020-11-03 DIAGNOSIS — I4819 Other persistent atrial fibrillation: Secondary | ICD-10-CM | POA: Diagnosis not present

## 2020-11-03 DIAGNOSIS — Q399 Congenital malformation of esophagus, unspecified: Secondary | ICD-10-CM | POA: Diagnosis not present

## 2020-11-03 LAB — PHOSPHORUS: Phosphorus: 3.3 mg/dL (ref 2.5–4.6)

## 2020-11-03 LAB — CBC WITH DIFFERENTIAL/PLATELET
Abs Immature Granulocytes: 0.04 10*3/uL (ref 0.00–0.07)
Basophils Absolute: 0.1 10*3/uL (ref 0.0–0.1)
Basophils Relative: 1 %
Eosinophils Absolute: 0.1 10*3/uL (ref 0.0–0.5)
Eosinophils Relative: 1 %
HCT: 44.5 % (ref 39.0–52.0)
Hemoglobin: 14.1 g/dL (ref 13.0–17.0)
Immature Granulocytes: 0 %
Lymphocytes Relative: 23 %
Lymphs Abs: 2.2 10*3/uL (ref 0.7–4.0)
MCH: 30.9 pg (ref 26.0–34.0)
MCHC: 31.7 g/dL (ref 30.0–36.0)
MCV: 97.4 fL (ref 80.0–100.0)
Monocytes Absolute: 0.8 10*3/uL (ref 0.1–1.0)
Monocytes Relative: 8 %
Neutro Abs: 6.3 10*3/uL (ref 1.7–7.7)
Neutrophils Relative %: 67 %
Platelets: 181 10*3/uL (ref 150–400)
RBC: 4.57 MIL/uL (ref 4.22–5.81)
RDW: 14.1 % (ref 11.5–15.5)
WBC: 9.5 10*3/uL (ref 4.0–10.5)
nRBC: 0 % (ref 0.0–0.2)

## 2020-11-03 LAB — COMPREHENSIVE METABOLIC PANEL
ALT: 15 U/L (ref 0–44)
AST: 20 U/L (ref 15–41)
Albumin: 3.6 g/dL (ref 3.5–5.0)
Alkaline Phosphatase: 45 U/L (ref 38–126)
Anion gap: 11 (ref 5–15)
BUN: 12 mg/dL (ref 8–23)
CO2: 23 mmol/L (ref 22–32)
Calcium: 9.1 mg/dL (ref 8.9–10.3)
Chloride: 104 mmol/L (ref 98–111)
Creatinine, Ser: 0.78 mg/dL (ref 0.61–1.24)
GFR, Estimated: >60 mL/min (ref >60)
Glucose, Bld: 99 mg/dL (ref 70–99)
Potassium: 4 mmol/L (ref 3.5–5.1)
Sodium: 138 mmol/L (ref 135–145)
Total Bilirubin: 1.7 mg/dL — ABNORMAL HIGH (ref 0.3–1.2)
Total Protein: 5.7 g/dL — ABNORMAL LOW (ref 6.5–8.1)

## 2020-11-03 LAB — MAGNESIUM: Magnesium: 1.8 mg/dL (ref 1.7–2.4)

## 2020-11-03 LAB — GLUCOSE, CAPILLARY
Glucose-Capillary: 104 mg/dL — ABNORMAL HIGH (ref 70–99)
Glucose-Capillary: 93 mg/dL (ref 70–99)

## 2020-11-03 MED ORDER — AMLODIPINE BESYLATE 5 MG PO TABS: 5.0000 mg | ORAL_TABLET | Freq: Every day | ORAL | 0 refills | Status: DC

## 2020-11-03 NOTE — TOC Progression Note (Signed)
Transition of Care [facility]) - Progression Note    Patient Details  Name: Justin Potter MRN: [id number] Date of Birth: 05/14/1926  [facility] CM/SW Contact  Ross Ludwig, [place] Phone Number: 11/03/2020, 10:28 AM  Clinical Narrative:    CSW spoke to patient's son Hermilo and explained that patient has been switched to observation.  Patient's son did no have any other questions and gave CSW verbal consent to sign notice.       Expected Discharge Plan and Services  Patient discharging back home.         Expected Discharge Date: 11/03/20                                     Social Determinants of Health (SDOH) Interventions    Readmission Risk Interventions No flowsheet data found.

## 2020-11-03 NOTE — Care Management CC44 (Signed)
Condition Code 44 Documentation Completed  Patient Details  Name: Justin Potter MRN: [id number] Date of Birth: [birth year]   Condition Code 44 given:  Yes Patient signature on Condition Code 44 notice:  Yes Documentation of 2 MD's agreement:  Yes Code 44 added to claim:  Yes    Ross Ludwig, LCSW 11/03/2020, 10:27 AM

## 2020-11-03 NOTE — Anesthesia Postprocedure Evaluation (Signed)
Anesthesia Post Note  Patient: Justin Potter  Procedure(s) Performed: ESOPHAGOGASTRODUODENOSCOPY (EGD) WITH PROPOFOL (N/A ) SAVORY DILATION (N/A )     Patient location during evaluation: Endoscopy Anesthesia Type: MAC Level of consciousness: awake and alert Pain management: pain level controlled Vital Signs Assessment: post-procedure vital signs reviewed and stable Respiratory status: spontaneous breathing, nonlabored ventilation, respiratory function stable and patient connected to nasal cannula oxygen Cardiovascular status: stable and blood pressure returned to baseline Postop Assessment: no apparent nausea or vomiting Anesthetic complications: no Comments: Patient blood pressure improving in recovery after receiving IV antihypertensives. Son at the bedside and prefers patient to be admitted for blood pressure management as he lives alone. Hospital admission discussed with Dr. Watt Climes   No complications documented.  Last Vitals:  Vitals:   11/03/20 0247 11/03/20 0622  BP: 140/79 (!) 152/70  Pulse: (!) 55 (!) 53  Resp: 20 20  Temp:  36.6 C  SpO2: 98% 100%    Last Pain:  Vitals:   11/03/20 0622  TempSrc: Oral  PainSc:                  Karyl Kinnier Tashya Alberty

## 2020-11-03 NOTE — Discharge Summary (Signed)
Physician Discharge Summary  Justin Potter EXH:[id number] DOB: [birth year] DOA: 11/02/2020  PCP: [name], MD  Admit date: 11/02/2020 Discharge date: 11/03/2020  Admitted From: Home by way of endoscopy suite Disposition: Home   Recommendations for Outpatient Follow-up:  1. Follow up with PCP in 1-2 weeks for BP management. Restarted pt's home norvasc 5mg . 2. Follow up with GI per their recommendations.  Home Health: None Equipment/Devices: None Discharge Condition: Stable CODE STATUS: Full Diet recommendation: Soft as tolerated  Brief/Interim Summary: Justin Potter is a 87 y.o. male with a history of AFib, bradycardia, HTN, prostate CA s/p radioactive seed implantation, urethral stricture, TIA, anxiety, arthritis, HLD, macular degeneration, and HTN who presented to endoscopy suite 4/12 for esophageal dilatation due to dysphagia. BP was noted to be elevated at 240/128 for which IV hydralazine was given with improvement in BP. GI requested overnight admission for high blood pressure which was improved with restarting patient's prior norvasc 5mg . The following morning the patient's vital signs are stable and he is requesting discharge.   Discharge Diagnoses:  Principal Problem:   Hypertension Active Problems:   Atrial fibrillation (HCC)   Arthritis   Long term current use of anticoagulant therapy   Advanced nonexudative age-related macular degeneration of left eye with subfoveal involvement   Advanced nonexudative age-related macular degeneration of right eye with subfoveal involvement   Dysphagia   Dysuria   Acquired thrombophilia ([place])  All other conditions were stable without changes to treatment plans.  Discharge Instructions Discharge Instructions    Discharge instructions   Complete by: As directed    Start taking norvasc (amlodipine) 5mg  tablet once daily for high blood pressure. Please check your blood pressure regularly and follow up with your PCP in the next 1-2  weeks to continue management of hypertension. Otherwise, follow the advice of your GI doctor regarding your diet and post-dilatation care. Seek medical attention right away if you develop chest pain, shortness of breath, or other issues.   Increase activity slowly   Complete by: As directed      Allergies as of 11/03/2020   No Known Allergies     Medication List    TAKE these medications   acetaminophen 500 MG tablet Commonly known as: TYLENOL Take 500 mg by mouth daily as needed (pain).   amLODipine 5 MG tablet Commonly known as: NORVASC Take 1 tablet (5 mg total) by mouth daily.   Eliquis 5 MG Tabs tablet Generic drug: apixaban Take 1 tablet (5 mg total) by mouth 2 (two) times daily.   OCUVITE PRESERVISION PO Take 1 tablet by mouth every morning.       Follow-up Information    Call Clarene Essex, MD.   Specialty: Gastroenterology Why: As needed or if question or problems or if swallowing issues do not improve and follow-up as needed Contact information: [address] [place] [address] [phone]        [name], MD Follow up.   Specialty: Internal Medicine Contact information: [address] [place] [address] [phone]        Belva Crome, MD .   Specialty: Cardiology Contact information: [phone] [place] [address] [phone]              No Known Allergies  Consultations:  None  Procedures/Studies: X-ray chest PA and lateral  Result Date: 11/02/2020 CLINICAL DATA:  Elevated blood pressure EXAM: CHEST - 2 VIEW COMPARISON:  08/20/2013,  CT 02/10/2014 FINDINGS: No focal opacity or pleural effusion. Mild cardiomegaly. No pneumothorax IMPRESSION: No active cardiopulmonary disease. Mild cardiomegaly. Electronically Signed   By: Donavan Foil M.D.   On: 11/02/2020 20:14     Subjective: Feels well. No HA, vision changes (chronic bad vision from ARMD), chest pain, dyspnea,  or urinary complaints or fever. Wants to go home ASAP.  Discharge Exam: Vitals:   11/03/20 0247 11/03/20 0622  BP: 140/79 (!) 152/70  Pulse: (!) 55 (!) 53  Resp: 20 20  Temp:  97.8 F (36.6 C)  SpO2: 98% 100%   General: Pt is alert, awake, not in acute distress Cardiovascular: RRR, S1/S2 +, no rubs, no gallops Respiratory: CTA bilaterally, no wheezing, no rhonchi Abdominal: Soft, NT, ND, bowel sounds + Extremities: No edema, no cyanosis  Labs: BNP (last 3 results) No results for input(s): BNP in the last 8760 hours. Basic Metabolic Panel: Recent Labs  Lab 11/02/20 1859 11/03/20 0335  NA 143 138  K 4.0 4.0  CL 105 104  CO2 30 23  GLUCOSE 105* 99  BUN 14 12  CREATININE 0.88 0.78  CALCIUM 9.5 9.1  MG 1.9 1.8  PHOS 2.9 3.3   Liver Function Tests: Recent Labs  Lab 11/02/20 1859 11/03/20 0335  AST 21 20  ALT 14 15  ALKPHOS 49 45  BILITOT 1.2 1.7*  PROT 6.4* 5.7*  ALBUMIN 3.9 3.6   No results for input(s): LIPASE, AMYLASE in the last 168 hours. No results for input(s): AMMONIA in the last 168 hours. CBC: Recent Labs  Lab 11/02/20 1859 11/03/20 0335  WBC 9.9 9.5  NEUTROABS 6.8 6.3  HGB 14.2 14.1  HCT 44.0 44.5  MCV 96.5 97.4  PLT 180 181   Cardiac Enzymes: No results for input(s): CKTOTAL, CKMB, CKMBINDEX, TROPONINI in the last 168 hours. BNP: Invalid input(s): POCBNP CBG: Recent Labs  Lab 11/03/20 0002 11/03/20 0806  GLUCAP 104* 93   D-Dimer No results for input(s): DDIMER in the last 72 hours. Hgb A1c No results for input(s): HGBA1C in the last 72 hours. Lipid Profile No results for input(s): CHOL, HDL, LDLCALC, TRIG, CHOLHDL, LDLDIRECT in the last 72 hours. Thyroid function studies Recent Labs    11/02/20 1859  TSH 1.380   Anemia work up No results for input(s): VITAMINB12, FOLATE, FERRITIN, TIBC, IRON, RETICCTPCT in the last 72 hours. Urinalysis    Component Value Date/Time   COLORURINE YELLOW 11/02/2020 2130   APPEARANCEUR CLEAR  11/02/2020 2130   LABSPEC 1.002 (L) 11/02/2020 2130   PHURINE 6.0 11/02/2020 2130   GLUCOSEU NEGATIVE 11/02/2020 2130   HGBUR NEGATIVE 11/02/2020 2130   BILIRUBINUR NEGATIVE 11/02/2020 2130   KETONESUR NEGATIVE 11/02/2020 2130   PROTEINUR NEGATIVE 11/02/2020 2130   UROBILINOGEN 0.2 08/20/2013 1632   NITRITE NEGATIVE 11/02/2020 2130   LEUKOCYTESUR NEGATIVE 11/02/2020 2130    Microbiology Recent Results (from the past 240 hour(s))  SARS CORONAVIRUS 2 (TAT 6-24 HRS) Nasopharyngeal Nasopharyngeal Swab     Status: None   Collection Time: 10/29/20 12:31 PM   Specimen: Nasopharyngeal Swab  Result Value Ref Range Status   SARS Coronavirus 2 NEGATIVE NEGATIVE Final    Comment: (NOTE) SARS-CoV-2 target nucleic acids are NOT DETECTED.  The SARS-CoV-2 RNA is generally detectable in upper and lower respiratory specimens during the acute phase of infection. Negative results do not preclude SARS-CoV-2 infection, do not rule out co-infections with other pathogens, and should not be used as the sole basis for treatment or other  patient management decisions. Negative results must be combined with clinical observations, patient history, and epidemiological information. The expected result is Negative.  Fact Sheet for Patients: SugarRoll.be  Fact Sheet for Healthcare Providers: https://www.woods-mathews.com/  This test is not yet approved or cleared by the [place] FDA and  has been authorized for detection and/or diagnosis of SARS-CoV-2 by FDA under an Emergency Use Authorization (EUA). This EUA will remain  in effect (meaning this test can be used) for the duration of the COVID-19 declaration under Se ction 564(b)(1) of the Act, 21 U.S.C. section 360bbb-3(b)(1), unless the authorization is terminated or revoked sooner.  Performed at [facility], [place] [address] [place], [place] [address]     Time coordinating discharge:  Approximately 40 minutes  Patrecia Pour, MD  [facility] 11/03/2020, 9:49 AM

## 2020-11-03 NOTE — Plan of Care (Signed)
  Problem: Activity: Goal: Risk for activity intolerance will decrease Outcome: Progressing   Problem: Coping: Goal: Level of anxiety will decrease Outcome: Progressing   Problem: Elimination: Goal: Will not experience complications related to bowel motility Outcome: Progressing   Problem: Safety: Goal: Ability to remain free from injury will improve Outcome: Progressing   

## 2020-11-03 NOTE — Care Management Obs Status (Signed)
[place] NOTIFICATION   Patient Details  Name: Justin Potter MRN: [id number] Date of Birth: 09-Sep-1925   Medicare Observation Status Notification Given:  Yes    Cecil Cobbs 11/03/2020, 10:27 AM

## 2020-11-04 ENCOUNTER — Other Ambulatory Visit: Payer: Self-pay | Admitting: Internal Medicine

## 2020-11-04 ENCOUNTER — Ambulatory Visit
Admission: RE | Admit: 2020-11-04 | Discharge: 2020-11-04 | Disposition: A | Discharge status: Home / Self Care | Payer: Medicare Other | Source: Ambulatory Visit | Attending: Internal Medicine | Admitting: Internal Medicine

## 2020-11-04 DIAGNOSIS — F99 Mental disorder, not otherwise specified: Secondary | ICD-10-CM | POA: Diagnosis not present

## 2020-11-04 DIAGNOSIS — I10 Essential (primary) hypertension: Secondary | ICD-10-CM | POA: Diagnosis not present

## 2020-11-04 DIAGNOSIS — R531 Weakness: Secondary | ICD-10-CM | POA: Diagnosis not present

## 2020-11-04 DIAGNOSIS — R410 Disorientation, unspecified: Secondary | ICD-10-CM | POA: Diagnosis not present

## 2020-11-04 DIAGNOSIS — R269 Unspecified abnormalities of gait and mobility: Secondary | ICD-10-CM | POA: Diagnosis not present

## 2020-11-04 LAB — URINE CULTURE: Culture: NO GROWTH

## 2020-11-08 DIAGNOSIS — M6281 Muscle weakness (generalized): Secondary | ICD-10-CM | POA: Diagnosis not present

## 2020-11-08 DIAGNOSIS — R2689 Other abnormalities of gait and mobility: Secondary | ICD-10-CM | POA: Diagnosis not present

## 2020-11-08 DIAGNOSIS — R262 Difficulty in walking, not elsewhere classified: Secondary | ICD-10-CM | POA: Diagnosis not present

## 2020-11-08 DIAGNOSIS — Z20828 Contact with and (suspected) exposure to other viral communicable diseases: Secondary | ICD-10-CM | POA: Diagnosis not present

## 2020-11-08 DIAGNOSIS — R2681 Unsteadiness on feet: Secondary | ICD-10-CM | POA: Diagnosis not present

## 2020-11-08 DIAGNOSIS — I69391 Dysphagia following cerebral infarction: Secondary | ICD-10-CM | POA: Diagnosis not present

## 2020-11-08 DIAGNOSIS — R1314 Dysphagia, pharyngoesophageal phase: Secondary | ICD-10-CM | POA: Diagnosis not present

## 2020-11-08 DIAGNOSIS — I69315 Cognitive social or emotional deficit following cerebral infarction: Secondary | ICD-10-CM | POA: Diagnosis not present

## 2020-11-08 DIAGNOSIS — R41841 Cognitive communication deficit: Secondary | ICD-10-CM | POA: Diagnosis not present

## 2020-11-08 DIAGNOSIS — Z1159 Encounter for screening for other viral diseases: Secondary | ICD-10-CM | POA: Diagnosis not present

## 2020-11-09 DIAGNOSIS — R1314 Dysphagia, pharyngoesophageal phase: Secondary | ICD-10-CM | POA: Diagnosis not present

## 2020-11-09 DIAGNOSIS — R262 Difficulty in walking, not elsewhere classified: Secondary | ICD-10-CM | POA: Diagnosis not present

## 2020-11-09 DIAGNOSIS — M6281 Muscle weakness (generalized): Secondary | ICD-10-CM | POA: Diagnosis not present

## 2020-11-09 DIAGNOSIS — R2681 Unsteadiness on feet: Secondary | ICD-10-CM | POA: Diagnosis not present

## 2020-11-09 DIAGNOSIS — I69315 Cognitive social or emotional deficit following cerebral infarction: Secondary | ICD-10-CM | POA: Diagnosis not present

## 2020-11-09 DIAGNOSIS — I69391 Dysphagia following cerebral infarction: Secondary | ICD-10-CM | POA: Diagnosis not present

## 2020-11-11 DIAGNOSIS — R262 Difficulty in walking, not elsewhere classified: Secondary | ICD-10-CM | POA: Diagnosis not present

## 2020-11-11 DIAGNOSIS — R1314 Dysphagia, pharyngoesophageal phase: Secondary | ICD-10-CM | POA: Diagnosis not present

## 2020-11-11 DIAGNOSIS — M6281 Muscle weakness (generalized): Secondary | ICD-10-CM | POA: Diagnosis not present

## 2020-11-11 DIAGNOSIS — I69315 Cognitive social or emotional deficit following cerebral infarction: Secondary | ICD-10-CM | POA: Diagnosis not present

## 2020-11-11 DIAGNOSIS — R2681 Unsteadiness on feet: Secondary | ICD-10-CM | POA: Diagnosis not present

## 2020-11-11 DIAGNOSIS — I69391 Dysphagia following cerebral infarction: Secondary | ICD-10-CM | POA: Diagnosis not present

## 2020-11-12 DIAGNOSIS — I69391 Dysphagia following cerebral infarction: Secondary | ICD-10-CM | POA: Diagnosis not present

## 2020-11-12 DIAGNOSIS — R2681 Unsteadiness on feet: Secondary | ICD-10-CM | POA: Diagnosis not present

## 2020-11-12 DIAGNOSIS — I69315 Cognitive social or emotional deficit following cerebral infarction: Secondary | ICD-10-CM | POA: Diagnosis not present

## 2020-11-12 DIAGNOSIS — M6281 Muscle weakness (generalized): Secondary | ICD-10-CM | POA: Diagnosis not present

## 2020-11-12 DIAGNOSIS — R262 Difficulty in walking, not elsewhere classified: Secondary | ICD-10-CM | POA: Diagnosis not present

## 2020-11-12 DIAGNOSIS — R1314 Dysphagia, pharyngoesophageal phase: Secondary | ICD-10-CM | POA: Diagnosis not present

## 2020-11-15 DIAGNOSIS — I69391 Dysphagia following cerebral infarction: Secondary | ICD-10-CM | POA: Diagnosis not present

## 2020-11-15 DIAGNOSIS — R262 Difficulty in walking, not elsewhere classified: Secondary | ICD-10-CM | POA: Diagnosis not present

## 2020-11-15 DIAGNOSIS — M6281 Muscle weakness (generalized): Secondary | ICD-10-CM | POA: Diagnosis not present

## 2020-11-15 DIAGNOSIS — R1314 Dysphagia, pharyngoesophageal phase: Secondary | ICD-10-CM | POA: Diagnosis not present

## 2020-11-15 DIAGNOSIS — R2681 Unsteadiness on feet: Secondary | ICD-10-CM | POA: Diagnosis not present

## 2020-11-15 DIAGNOSIS — I69315 Cognitive social or emotional deficit following cerebral infarction: Secondary | ICD-10-CM | POA: Diagnosis not present

## 2020-11-16 DIAGNOSIS — R2681 Unsteadiness on feet: Secondary | ICD-10-CM | POA: Diagnosis not present

## 2020-11-16 DIAGNOSIS — M6281 Muscle weakness (generalized): Secondary | ICD-10-CM | POA: Diagnosis not present

## 2020-11-16 DIAGNOSIS — I69391 Dysphagia following cerebral infarction: Secondary | ICD-10-CM | POA: Diagnosis not present

## 2020-11-16 DIAGNOSIS — R262 Difficulty in walking, not elsewhere classified: Secondary | ICD-10-CM | POA: Diagnosis not present

## 2020-11-16 DIAGNOSIS — I69315 Cognitive social or emotional deficit following cerebral infarction: Secondary | ICD-10-CM | POA: Diagnosis not present

## 2020-11-16 DIAGNOSIS — R1314 Dysphagia, pharyngoesophageal phase: Secondary | ICD-10-CM | POA: Diagnosis not present

## 2020-11-17 DIAGNOSIS — R2681 Unsteadiness on feet: Secondary | ICD-10-CM | POA: Diagnosis not present

## 2020-11-17 DIAGNOSIS — I69315 Cognitive social or emotional deficit following cerebral infarction: Secondary | ICD-10-CM | POA: Diagnosis not present

## 2020-11-17 DIAGNOSIS — R262 Difficulty in walking, not elsewhere classified: Secondary | ICD-10-CM | POA: Diagnosis not present

## 2020-11-17 DIAGNOSIS — M6281 Muscle weakness (generalized): Secondary | ICD-10-CM | POA: Diagnosis not present

## 2020-11-17 DIAGNOSIS — R1314 Dysphagia, pharyngoesophageal phase: Secondary | ICD-10-CM | POA: Diagnosis not present

## 2020-11-17 DIAGNOSIS — I69391 Dysphagia following cerebral infarction: Secondary | ICD-10-CM | POA: Diagnosis not present

## 2020-11-18 DIAGNOSIS — R2681 Unsteadiness on feet: Secondary | ICD-10-CM | POA: Diagnosis not present

## 2020-11-18 DIAGNOSIS — R1314 Dysphagia, pharyngoesophageal phase: Secondary | ICD-10-CM | POA: Diagnosis not present

## 2020-11-18 DIAGNOSIS — M6281 Muscle weakness (generalized): Secondary | ICD-10-CM | POA: Diagnosis not present

## 2020-11-18 DIAGNOSIS — R262 Difficulty in walking, not elsewhere classified: Secondary | ICD-10-CM | POA: Diagnosis not present

## 2020-11-18 DIAGNOSIS — I69315 Cognitive social or emotional deficit following cerebral infarction: Secondary | ICD-10-CM | POA: Diagnosis not present

## 2020-11-18 DIAGNOSIS — I69391 Dysphagia following cerebral infarction: Secondary | ICD-10-CM | POA: Diagnosis not present

## 2020-11-19 DIAGNOSIS — I69315 Cognitive social or emotional deficit following cerebral infarction: Secondary | ICD-10-CM | POA: Diagnosis not present

## 2020-11-19 DIAGNOSIS — R2681 Unsteadiness on feet: Secondary | ICD-10-CM | POA: Diagnosis not present

## 2020-11-19 DIAGNOSIS — M6281 Muscle weakness (generalized): Secondary | ICD-10-CM | POA: Diagnosis not present

## 2020-11-19 DIAGNOSIS — I69391 Dysphagia following cerebral infarction: Secondary | ICD-10-CM | POA: Diagnosis not present

## 2020-11-19 DIAGNOSIS — R1314 Dysphagia, pharyngoesophageal phase: Secondary | ICD-10-CM | POA: Diagnosis not present

## 2020-11-19 DIAGNOSIS — R262 Difficulty in walking, not elsewhere classified: Secondary | ICD-10-CM | POA: Diagnosis not present

## 2020-11-22 DIAGNOSIS — Z20828 Contact with and (suspected) exposure to other viral communicable diseases: Secondary | ICD-10-CM | POA: Diagnosis not present

## 2020-11-22 DIAGNOSIS — I69391 Dysphagia following cerebral infarction: Secondary | ICD-10-CM | POA: Diagnosis not present

## 2020-11-22 DIAGNOSIS — R2681 Unsteadiness on feet: Secondary | ICD-10-CM | POA: Diagnosis not present

## 2020-11-22 DIAGNOSIS — R2689 Other abnormalities of gait and mobility: Secondary | ICD-10-CM | POA: Diagnosis not present

## 2020-11-22 DIAGNOSIS — Z1159 Encounter for screening for other viral diseases: Secondary | ICD-10-CM | POA: Diagnosis not present

## 2020-11-22 DIAGNOSIS — R278 Other lack of coordination: Secondary | ICD-10-CM | POA: Diagnosis not present

## 2020-11-22 DIAGNOSIS — R41841 Cognitive communication deficit: Secondary | ICD-10-CM | POA: Diagnosis not present

## 2020-11-22 DIAGNOSIS — I69315 Cognitive social or emotional deficit following cerebral infarction: Secondary | ICD-10-CM | POA: Diagnosis not present

## 2020-11-22 DIAGNOSIS — R262 Difficulty in walking, not elsewhere classified: Secondary | ICD-10-CM | POA: Diagnosis not present

## 2020-11-22 DIAGNOSIS — M6281 Muscle weakness (generalized): Secondary | ICD-10-CM | POA: Diagnosis not present

## 2020-11-22 DIAGNOSIS — R1314 Dysphagia, pharyngoesophageal phase: Secondary | ICD-10-CM | POA: Diagnosis not present

## 2020-11-23 DIAGNOSIS — R278 Other lack of coordination: Secondary | ICD-10-CM | POA: Diagnosis not present

## 2020-11-23 DIAGNOSIS — M6281 Muscle weakness (generalized): Secondary | ICD-10-CM | POA: Diagnosis not present

## 2020-11-23 DIAGNOSIS — R1314 Dysphagia, pharyngoesophageal phase: Secondary | ICD-10-CM | POA: Diagnosis not present

## 2020-11-23 DIAGNOSIS — R262 Difficulty in walking, not elsewhere classified: Secondary | ICD-10-CM | POA: Diagnosis not present

## 2020-11-23 DIAGNOSIS — I69315 Cognitive social or emotional deficit following cerebral infarction: Secondary | ICD-10-CM | POA: Diagnosis not present

## 2020-11-23 DIAGNOSIS — R2681 Unsteadiness on feet: Secondary | ICD-10-CM | POA: Diagnosis not present

## 2020-11-24 DIAGNOSIS — I69315 Cognitive social or emotional deficit following cerebral infarction: Secondary | ICD-10-CM | POA: Diagnosis not present

## 2020-11-24 DIAGNOSIS — R262 Difficulty in walking, not elsewhere classified: Secondary | ICD-10-CM | POA: Diagnosis not present

## 2020-11-24 DIAGNOSIS — R1314 Dysphagia, pharyngoesophageal phase: Secondary | ICD-10-CM | POA: Diagnosis not present

## 2020-11-24 DIAGNOSIS — R2681 Unsteadiness on feet: Secondary | ICD-10-CM | POA: Diagnosis not present

## 2020-11-24 DIAGNOSIS — M6281 Muscle weakness (generalized): Secondary | ICD-10-CM | POA: Diagnosis not present

## 2020-11-24 DIAGNOSIS — R278 Other lack of coordination: Secondary | ICD-10-CM | POA: Diagnosis not present

## 2020-11-25 DIAGNOSIS — R278 Other lack of coordination: Secondary | ICD-10-CM | POA: Diagnosis not present

## 2020-11-25 DIAGNOSIS — R2681 Unsteadiness on feet: Secondary | ICD-10-CM | POA: Diagnosis not present

## 2020-11-25 DIAGNOSIS — R1314 Dysphagia, pharyngoesophageal phase: Secondary | ICD-10-CM | POA: Diagnosis not present

## 2020-11-25 DIAGNOSIS — R262 Difficulty in walking, not elsewhere classified: Secondary | ICD-10-CM | POA: Diagnosis not present

## 2020-11-25 DIAGNOSIS — M6281 Muscle weakness (generalized): Secondary | ICD-10-CM | POA: Diagnosis not present

## 2020-11-25 DIAGNOSIS — I69315 Cognitive social or emotional deficit following cerebral infarction: Secondary | ICD-10-CM | POA: Diagnosis not present

## 2020-11-26 DIAGNOSIS — M6281 Muscle weakness (generalized): Secondary | ICD-10-CM | POA: Diagnosis not present

## 2020-11-26 DIAGNOSIS — R2681 Unsteadiness on feet: Secondary | ICD-10-CM | POA: Diagnosis not present

## 2020-11-26 DIAGNOSIS — I69315 Cognitive social or emotional deficit following cerebral infarction: Secondary | ICD-10-CM | POA: Diagnosis not present

## 2020-11-26 DIAGNOSIS — R1314 Dysphagia, pharyngoesophageal phase: Secondary | ICD-10-CM | POA: Diagnosis not present

## 2020-11-26 DIAGNOSIS — R278 Other lack of coordination: Secondary | ICD-10-CM | POA: Diagnosis not present

## 2020-11-26 DIAGNOSIS — R262 Difficulty in walking, not elsewhere classified: Secondary | ICD-10-CM | POA: Diagnosis not present

## 2020-11-29 DIAGNOSIS — Z1159 Encounter for screening for other viral diseases: Secondary | ICD-10-CM | POA: Diagnosis not present

## 2020-11-29 DIAGNOSIS — Z20828 Contact with and (suspected) exposure to other viral communicable diseases: Secondary | ICD-10-CM | POA: Diagnosis not present

## 2020-11-30 DIAGNOSIS — R278 Other lack of coordination: Secondary | ICD-10-CM | POA: Diagnosis not present

## 2020-11-30 DIAGNOSIS — M6281 Muscle weakness (generalized): Secondary | ICD-10-CM | POA: Diagnosis not present

## 2020-11-30 DIAGNOSIS — R262 Difficulty in walking, not elsewhere classified: Secondary | ICD-10-CM | POA: Diagnosis not present

## 2020-11-30 DIAGNOSIS — I69315 Cognitive social or emotional deficit following cerebral infarction: Secondary | ICD-10-CM | POA: Diagnosis not present

## 2020-11-30 DIAGNOSIS — R1314 Dysphagia, pharyngoesophageal phase: Secondary | ICD-10-CM | POA: Diagnosis not present

## 2020-11-30 DIAGNOSIS — R2681 Unsteadiness on feet: Secondary | ICD-10-CM | POA: Diagnosis not present

## 2020-12-01 ENCOUNTER — Encounter: Payer: Self-pay | Admitting: Podiatry

## 2020-12-01 ENCOUNTER — Ambulatory Visit (INDEPENDENT_AMBULATORY_CARE_PROVIDER_SITE_OTHER): Payer: Medicare Other | Admitting: Podiatry

## 2020-12-01 ENCOUNTER — Other Ambulatory Visit: Payer: Self-pay

## 2020-12-01 DIAGNOSIS — I69315 Cognitive social or emotional deficit following cerebral infarction: Secondary | ICD-10-CM | POA: Diagnosis not present

## 2020-12-01 DIAGNOSIS — B351 Tinea unguium: Secondary | ICD-10-CM | POA: Diagnosis not present

## 2020-12-01 DIAGNOSIS — R262 Difficulty in walking, not elsewhere classified: Secondary | ICD-10-CM | POA: Diagnosis not present

## 2020-12-01 DIAGNOSIS — M79675 Pain in left toe(s): Secondary | ICD-10-CM | POA: Diagnosis not present

## 2020-12-01 DIAGNOSIS — M6281 Muscle weakness (generalized): Secondary | ICD-10-CM | POA: Diagnosis not present

## 2020-12-01 DIAGNOSIS — R278 Other lack of coordination: Secondary | ICD-10-CM | POA: Diagnosis not present

## 2020-12-01 DIAGNOSIS — R1314 Dysphagia, pharyngoesophageal phase: Secondary | ICD-10-CM | POA: Diagnosis not present

## 2020-12-01 DIAGNOSIS — R2681 Unsteadiness on feet: Secondary | ICD-10-CM | POA: Diagnosis not present

## 2020-12-01 DIAGNOSIS — M79674 Pain in right toe(s): Secondary | ICD-10-CM

## 2020-12-01 NOTE — Progress Notes (Signed)
Subjective:   Patient ID: Justin Potter, male   DOB: 87 y.o.   MRN: [id number]   HPI Patient presents with thick toenails 1-5 both feet painful when pressed   ROS      Objective:  Physical Exam  Neurovascular status intact thick yellow brittle nailbeds 1-5 both feet     Assessment:  Mycotic nail infection with pain 1-5 both feet     Plan:  Painful nailbeds 1-5 both feet no iatrogenic bleeding

## 2020-12-02 DIAGNOSIS — R278 Other lack of coordination: Secondary | ICD-10-CM | POA: Diagnosis not present

## 2020-12-02 DIAGNOSIS — I69315 Cognitive social or emotional deficit following cerebral infarction: Secondary | ICD-10-CM | POA: Diagnosis not present

## 2020-12-02 DIAGNOSIS — R1314 Dysphagia, pharyngoesophageal phase: Secondary | ICD-10-CM | POA: Diagnosis not present

## 2020-12-02 DIAGNOSIS — R2681 Unsteadiness on feet: Secondary | ICD-10-CM | POA: Diagnosis not present

## 2020-12-02 DIAGNOSIS — R262 Difficulty in walking, not elsewhere classified: Secondary | ICD-10-CM | POA: Diagnosis not present

## 2020-12-02 DIAGNOSIS — M6281 Muscle weakness (generalized): Secondary | ICD-10-CM | POA: Diagnosis not present

## 2020-12-06 DIAGNOSIS — I69315 Cognitive social or emotional deficit following cerebral infarction: Secondary | ICD-10-CM | POA: Diagnosis not present

## 2020-12-06 DIAGNOSIS — R278 Other lack of coordination: Secondary | ICD-10-CM | POA: Diagnosis not present

## 2020-12-06 DIAGNOSIS — Z1159 Encounter for screening for other viral diseases: Secondary | ICD-10-CM | POA: Diagnosis not present

## 2020-12-06 DIAGNOSIS — R262 Difficulty in walking, not elsewhere classified: Secondary | ICD-10-CM | POA: Diagnosis not present

## 2020-12-06 DIAGNOSIS — M6281 Muscle weakness (generalized): Secondary | ICD-10-CM | POA: Diagnosis not present

## 2020-12-06 DIAGNOSIS — R2681 Unsteadiness on feet: Secondary | ICD-10-CM | POA: Diagnosis not present

## 2020-12-06 DIAGNOSIS — R1314 Dysphagia, pharyngoesophageal phase: Secondary | ICD-10-CM | POA: Diagnosis not present

## 2020-12-06 DIAGNOSIS — Z20828 Contact with and (suspected) exposure to other viral communicable diseases: Secondary | ICD-10-CM | POA: Diagnosis not present

## 2020-12-07 DIAGNOSIS — R262 Difficulty in walking, not elsewhere classified: Secondary | ICD-10-CM | POA: Diagnosis not present

## 2020-12-07 DIAGNOSIS — I69315 Cognitive social or emotional deficit following cerebral infarction: Secondary | ICD-10-CM | POA: Diagnosis not present

## 2020-12-07 DIAGNOSIS — R278 Other lack of coordination: Secondary | ICD-10-CM | POA: Diagnosis not present

## 2020-12-07 DIAGNOSIS — M6281 Muscle weakness (generalized): Secondary | ICD-10-CM | POA: Diagnosis not present

## 2020-12-07 DIAGNOSIS — R2681 Unsteadiness on feet: Secondary | ICD-10-CM | POA: Diagnosis not present

## 2020-12-07 DIAGNOSIS — R1314 Dysphagia, pharyngoesophageal phase: Secondary | ICD-10-CM | POA: Diagnosis not present

## 2020-12-09 DIAGNOSIS — R1314 Dysphagia, pharyngoesophageal phase: Secondary | ICD-10-CM | POA: Diagnosis not present

## 2020-12-09 DIAGNOSIS — R262 Difficulty in walking, not elsewhere classified: Secondary | ICD-10-CM | POA: Diagnosis not present

## 2020-12-09 DIAGNOSIS — M6281 Muscle weakness (generalized): Secondary | ICD-10-CM | POA: Diagnosis not present

## 2020-12-09 DIAGNOSIS — R2681 Unsteadiness on feet: Secondary | ICD-10-CM | POA: Diagnosis not present

## 2020-12-09 DIAGNOSIS — I69315 Cognitive social or emotional deficit following cerebral infarction: Secondary | ICD-10-CM | POA: Diagnosis not present

## 2020-12-09 DIAGNOSIS — R278 Other lack of coordination: Secondary | ICD-10-CM | POA: Diagnosis not present

## 2020-12-13 DIAGNOSIS — Z1159 Encounter for screening for other viral diseases: Secondary | ICD-10-CM | POA: Diagnosis not present

## 2020-12-13 DIAGNOSIS — Z20828 Contact with and (suspected) exposure to other viral communicable diseases: Secondary | ICD-10-CM | POA: Diagnosis not present

## 2020-12-14 DIAGNOSIS — R2681 Unsteadiness on feet: Secondary | ICD-10-CM | POA: Diagnosis not present

## 2020-12-14 DIAGNOSIS — R262 Difficulty in walking, not elsewhere classified: Secondary | ICD-10-CM | POA: Diagnosis not present

## 2020-12-14 DIAGNOSIS — M6281 Muscle weakness (generalized): Secondary | ICD-10-CM | POA: Diagnosis not present

## 2020-12-14 DIAGNOSIS — I69315 Cognitive social or emotional deficit following cerebral infarction: Secondary | ICD-10-CM | POA: Diagnosis not present

## 2020-12-14 DIAGNOSIS — R278 Other lack of coordination: Secondary | ICD-10-CM | POA: Diagnosis not present

## 2020-12-14 DIAGNOSIS — R1314 Dysphagia, pharyngoesophageal phase: Secondary | ICD-10-CM | POA: Diagnosis not present

## 2020-12-15 DIAGNOSIS — R1314 Dysphagia, pharyngoesophageal phase: Secondary | ICD-10-CM | POA: Diagnosis not present

## 2020-12-15 DIAGNOSIS — R2681 Unsteadiness on feet: Secondary | ICD-10-CM | POA: Diagnosis not present

## 2020-12-15 DIAGNOSIS — M6281 Muscle weakness (generalized): Secondary | ICD-10-CM | POA: Diagnosis not present

## 2020-12-15 DIAGNOSIS — I69315 Cognitive social or emotional deficit following cerebral infarction: Secondary | ICD-10-CM | POA: Diagnosis not present

## 2020-12-15 DIAGNOSIS — R262 Difficulty in walking, not elsewhere classified: Secondary | ICD-10-CM | POA: Diagnosis not present

## 2020-12-15 DIAGNOSIS — R278 Other lack of coordination: Secondary | ICD-10-CM | POA: Diagnosis not present

## 2020-12-17 DIAGNOSIS — R1314 Dysphagia, pharyngoesophageal phase: Secondary | ICD-10-CM | POA: Diagnosis not present

## 2020-12-17 DIAGNOSIS — M6281 Muscle weakness (generalized): Secondary | ICD-10-CM | POA: Diagnosis not present

## 2020-12-17 DIAGNOSIS — R2681 Unsteadiness on feet: Secondary | ICD-10-CM | POA: Diagnosis not present

## 2020-12-17 DIAGNOSIS — I69315 Cognitive social or emotional deficit following cerebral infarction: Secondary | ICD-10-CM | POA: Diagnosis not present

## 2020-12-17 DIAGNOSIS — R278 Other lack of coordination: Secondary | ICD-10-CM | POA: Diagnosis not present

## 2020-12-17 DIAGNOSIS — R262 Difficulty in walking, not elsewhere classified: Secondary | ICD-10-CM | POA: Diagnosis not present

## 2020-12-20 DIAGNOSIS — Z1159 Encounter for screening for other viral diseases: Secondary | ICD-10-CM | POA: Diagnosis not present

## 2020-12-20 DIAGNOSIS — Z20828 Contact with and (suspected) exposure to other viral communicable diseases: Secondary | ICD-10-CM | POA: Diagnosis not present

## 2020-12-22 DIAGNOSIS — R2689 Other abnormalities of gait and mobility: Secondary | ICD-10-CM | POA: Diagnosis not present

## 2020-12-22 DIAGNOSIS — I69315 Cognitive social or emotional deficit following cerebral infarction: Secondary | ICD-10-CM | POA: Diagnosis not present

## 2020-12-22 DIAGNOSIS — M6281 Muscle weakness (generalized): Secondary | ICD-10-CM | POA: Diagnosis not present

## 2020-12-22 DIAGNOSIS — R41841 Cognitive communication deficit: Secondary | ICD-10-CM | POA: Diagnosis not present

## 2020-12-22 DIAGNOSIS — R1314 Dysphagia, pharyngoesophageal phase: Secondary | ICD-10-CM | POA: Diagnosis not present

## 2020-12-22 DIAGNOSIS — R2681 Unsteadiness on feet: Secondary | ICD-10-CM | POA: Diagnosis not present

## 2020-12-22 DIAGNOSIS — R262 Difficulty in walking, not elsewhere classified: Secondary | ICD-10-CM | POA: Diagnosis not present

## 2020-12-22 DIAGNOSIS — I69391 Dysphagia following cerebral infarction: Secondary | ICD-10-CM | POA: Diagnosis not present

## 2020-12-27 DIAGNOSIS — Z20828 Contact with and (suspected) exposure to other viral communicable diseases: Secondary | ICD-10-CM | POA: Diagnosis not present

## 2020-12-27 DIAGNOSIS — Z1159 Encounter for screening for other viral diseases: Secondary | ICD-10-CM | POA: Diagnosis not present

## 2020-12-28 DIAGNOSIS — R262 Difficulty in walking, not elsewhere classified: Secondary | ICD-10-CM | POA: Diagnosis not present

## 2020-12-28 DIAGNOSIS — M6281 Muscle weakness (generalized): Secondary | ICD-10-CM | POA: Diagnosis not present

## 2020-12-28 DIAGNOSIS — I69315 Cognitive social or emotional deficit following cerebral infarction: Secondary | ICD-10-CM | POA: Diagnosis not present

## 2020-12-28 DIAGNOSIS — R1314 Dysphagia, pharyngoesophageal phase: Secondary | ICD-10-CM | POA: Diagnosis not present

## 2020-12-28 DIAGNOSIS — R2681 Unsteadiness on feet: Secondary | ICD-10-CM | POA: Diagnosis not present

## 2020-12-28 DIAGNOSIS — I69391 Dysphagia following cerebral infarction: Secondary | ICD-10-CM | POA: Diagnosis not present

## 2020-12-29 DIAGNOSIS — I69391 Dysphagia following cerebral infarction: Secondary | ICD-10-CM | POA: Diagnosis not present

## 2020-12-29 DIAGNOSIS — M6281 Muscle weakness (generalized): Secondary | ICD-10-CM | POA: Diagnosis not present

## 2020-12-29 DIAGNOSIS — R2681 Unsteadiness on feet: Secondary | ICD-10-CM | POA: Diagnosis not present

## 2020-12-29 DIAGNOSIS — I69315 Cognitive social or emotional deficit following cerebral infarction: Secondary | ICD-10-CM | POA: Diagnosis not present

## 2020-12-29 DIAGNOSIS — R1314 Dysphagia, pharyngoesophageal phase: Secondary | ICD-10-CM | POA: Diagnosis not present

## 2020-12-29 DIAGNOSIS — R262 Difficulty in walking, not elsewhere classified: Secondary | ICD-10-CM | POA: Diagnosis not present

## 2020-12-31 DIAGNOSIS — R1314 Dysphagia, pharyngoesophageal phase: Secondary | ICD-10-CM | POA: Diagnosis not present

## 2020-12-31 DIAGNOSIS — R262 Difficulty in walking, not elsewhere classified: Secondary | ICD-10-CM | POA: Diagnosis not present

## 2020-12-31 DIAGNOSIS — M6281 Muscle weakness (generalized): Secondary | ICD-10-CM | POA: Diagnosis not present

## 2020-12-31 DIAGNOSIS — I69391 Dysphagia following cerebral infarction: Secondary | ICD-10-CM | POA: Diagnosis not present

## 2020-12-31 DIAGNOSIS — I69315 Cognitive social or emotional deficit following cerebral infarction: Secondary | ICD-10-CM | POA: Diagnosis not present

## 2020-12-31 DIAGNOSIS — R2681 Unsteadiness on feet: Secondary | ICD-10-CM | POA: Diagnosis not present

## 2021-01-03 DIAGNOSIS — R1314 Dysphagia, pharyngoesophageal phase: Secondary | ICD-10-CM | POA: Diagnosis not present

## 2021-01-03 DIAGNOSIS — Z1159 Encounter for screening for other viral diseases: Secondary | ICD-10-CM | POA: Diagnosis not present

## 2021-01-03 DIAGNOSIS — R2681 Unsteadiness on feet: Secondary | ICD-10-CM | POA: Diagnosis not present

## 2021-01-03 DIAGNOSIS — I69391 Dysphagia following cerebral infarction: Secondary | ICD-10-CM | POA: Diagnosis not present

## 2021-01-03 DIAGNOSIS — I69315 Cognitive social or emotional deficit following cerebral infarction: Secondary | ICD-10-CM | POA: Diagnosis not present

## 2021-01-03 DIAGNOSIS — R262 Difficulty in walking, not elsewhere classified: Secondary | ICD-10-CM | POA: Diagnosis not present

## 2021-01-03 DIAGNOSIS — Z20828 Contact with and (suspected) exposure to other viral communicable diseases: Secondary | ICD-10-CM | POA: Diagnosis not present

## 2021-01-03 DIAGNOSIS — M6281 Muscle weakness (generalized): Secondary | ICD-10-CM | POA: Diagnosis not present

## 2021-01-04 DIAGNOSIS — M6281 Muscle weakness (generalized): Secondary | ICD-10-CM | POA: Diagnosis not present

## 2021-01-04 DIAGNOSIS — R1314 Dysphagia, pharyngoesophageal phase: Secondary | ICD-10-CM | POA: Diagnosis not present

## 2021-01-04 DIAGNOSIS — I69315 Cognitive social or emotional deficit following cerebral infarction: Secondary | ICD-10-CM | POA: Diagnosis not present

## 2021-01-04 DIAGNOSIS — R262 Difficulty in walking, not elsewhere classified: Secondary | ICD-10-CM | POA: Diagnosis not present

## 2021-01-04 DIAGNOSIS — I69391 Dysphagia following cerebral infarction: Secondary | ICD-10-CM | POA: Diagnosis not present

## 2021-01-04 DIAGNOSIS — R2681 Unsteadiness on feet: Secondary | ICD-10-CM | POA: Diagnosis not present

## 2021-01-05 DIAGNOSIS — M6281 Muscle weakness (generalized): Secondary | ICD-10-CM | POA: Diagnosis not present

## 2021-01-05 DIAGNOSIS — I69315 Cognitive social or emotional deficit following cerebral infarction: Secondary | ICD-10-CM | POA: Diagnosis not present

## 2021-01-05 DIAGNOSIS — I69391 Dysphagia following cerebral infarction: Secondary | ICD-10-CM | POA: Diagnosis not present

## 2021-01-05 DIAGNOSIS — R2681 Unsteadiness on feet: Secondary | ICD-10-CM | POA: Diagnosis not present

## 2021-01-05 DIAGNOSIS — R262 Difficulty in walking, not elsewhere classified: Secondary | ICD-10-CM | POA: Diagnosis not present

## 2021-01-05 DIAGNOSIS — R1314 Dysphagia, pharyngoesophageal phase: Secondary | ICD-10-CM | POA: Diagnosis not present

## 2021-01-06 DIAGNOSIS — I69391 Dysphagia following cerebral infarction: Secondary | ICD-10-CM | POA: Diagnosis not present

## 2021-01-06 DIAGNOSIS — R262 Difficulty in walking, not elsewhere classified: Secondary | ICD-10-CM | POA: Diagnosis not present

## 2021-01-06 DIAGNOSIS — R2681 Unsteadiness on feet: Secondary | ICD-10-CM | POA: Diagnosis not present

## 2021-01-06 DIAGNOSIS — R1314 Dysphagia, pharyngoesophageal phase: Secondary | ICD-10-CM | POA: Diagnosis not present

## 2021-01-06 DIAGNOSIS — I69315 Cognitive social or emotional deficit following cerebral infarction: Secondary | ICD-10-CM | POA: Diagnosis not present

## 2021-01-06 DIAGNOSIS — M6281 Muscle weakness (generalized): Secondary | ICD-10-CM | POA: Diagnosis not present

## 2021-01-10 DIAGNOSIS — Z20828 Contact with and (suspected) exposure to other viral communicable diseases: Secondary | ICD-10-CM | POA: Diagnosis not present

## 2021-01-10 DIAGNOSIS — I69315 Cognitive social or emotional deficit following cerebral infarction: Secondary | ICD-10-CM | POA: Diagnosis not present

## 2021-01-10 DIAGNOSIS — Z1159 Encounter for screening for other viral diseases: Secondary | ICD-10-CM | POA: Diagnosis not present

## 2021-01-10 DIAGNOSIS — M6281 Muscle weakness (generalized): Secondary | ICD-10-CM | POA: Diagnosis not present

## 2021-01-10 DIAGNOSIS — I69391 Dysphagia following cerebral infarction: Secondary | ICD-10-CM | POA: Diagnosis not present

## 2021-01-10 DIAGNOSIS — R262 Difficulty in walking, not elsewhere classified: Secondary | ICD-10-CM | POA: Diagnosis not present

## 2021-01-10 DIAGNOSIS — R1314 Dysphagia, pharyngoesophageal phase: Secondary | ICD-10-CM | POA: Diagnosis not present

## 2021-01-10 DIAGNOSIS — R2681 Unsteadiness on feet: Secondary | ICD-10-CM | POA: Diagnosis not present

## 2021-01-11 ENCOUNTER — Other Ambulatory Visit: Payer: Self-pay | Admitting: *Deleted

## 2021-01-11 DIAGNOSIS — I69391 Dysphagia following cerebral infarction: Secondary | ICD-10-CM | POA: Diagnosis not present

## 2021-01-11 DIAGNOSIS — R2681 Unsteadiness on feet: Secondary | ICD-10-CM | POA: Diagnosis not present

## 2021-01-11 DIAGNOSIS — I69315 Cognitive social or emotional deficit following cerebral infarction: Secondary | ICD-10-CM | POA: Diagnosis not present

## 2021-01-11 DIAGNOSIS — R1314 Dysphagia, pharyngoesophageal phase: Secondary | ICD-10-CM | POA: Diagnosis not present

## 2021-01-11 DIAGNOSIS — R262 Difficulty in walking, not elsewhere classified: Secondary | ICD-10-CM | POA: Diagnosis not present

## 2021-01-11 DIAGNOSIS — M6281 Muscle weakness (generalized): Secondary | ICD-10-CM | POA: Diagnosis not present

## 2021-01-11 NOTE — Patient Outreach (Signed)
[facility]) Care Management  01/11/2021  Justin Potter February 27, 1926 [id number]   Referral Date: 6/20 Referral Source: Self/Triage call Referral Reason: Chronic care management/Plan of care for rehab services Insurance: [place] attempt #1, successful to son.  Identity verified.  This care manager introduced self and stated purpose of call.  [facility] care management services explained.    Son was curious about [facility] services and wanted more information.  He is currently out of town and will call this care manager once he is back.  Plan: RN CM will send successful outreach letter with this care manager's contact information and [facility] brochure.  Will await call back from son on Monday, 6/27 for further assessment/evaluation.  Valente David, [place], MSN [place] [phone]

## 2021-01-12 DIAGNOSIS — M6281 Muscle weakness (generalized): Secondary | ICD-10-CM | POA: Diagnosis not present

## 2021-01-12 DIAGNOSIS — I69315 Cognitive social or emotional deficit following cerebral infarction: Secondary | ICD-10-CM | POA: Diagnosis not present

## 2021-01-12 DIAGNOSIS — R1314 Dysphagia, pharyngoesophageal phase: Secondary | ICD-10-CM | POA: Diagnosis not present

## 2021-01-12 DIAGNOSIS — I69391 Dysphagia following cerebral infarction: Secondary | ICD-10-CM | POA: Diagnosis not present

## 2021-01-12 DIAGNOSIS — R262 Difficulty in walking, not elsewhere classified: Secondary | ICD-10-CM | POA: Diagnosis not present

## 2021-01-12 DIAGNOSIS — R2681 Unsteadiness on feet: Secondary | ICD-10-CM | POA: Diagnosis not present

## 2021-01-17 ENCOUNTER — Other Ambulatory Visit: Payer: Self-pay | Admitting: *Deleted

## 2021-01-17 ENCOUNTER — Encounter: Payer: Self-pay | Admitting: *Deleted

## 2021-01-17 DIAGNOSIS — Z1159 Encounter for screening for other viral diseases: Secondary | ICD-10-CM | POA: Diagnosis not present

## 2021-01-17 DIAGNOSIS — Z20828 Contact with and (suspected) exposure to other viral communicable diseases: Secondary | ICD-10-CM | POA: Diagnosis not present

## 2021-01-17 NOTE — Patient Outreach (Signed)
Justin Potter) Care Management  [place]  01/17/2021   Justin Potter 1926/02/05 [id number]   Call received back from son.  Identity verified.  This care manager introduced self and stated purpose of call.  [facility] care management services explained.    Member currently living in independent living at [facility].  Son report he was interested in finding out more information regarding the ability to get in to facilities without the 3 day hospital qualifying stay.  Currently member is managing well, has aide, exercise sessions, and PT several times a week.  Son also visits and monitors blood pressure and HR 3 times a week (he is a retired Engineer, drilling).  Assessment completed, Denies any urgent concerns, encouraged to contact this care manager with questions.     Encounter Medications:  Outpatient Encounter Medications as of 01/17/2021  Medication Sig Note   acetaminophen (TYLENOL) 500 MG tablet Take 500 mg by mouth daily as needed (pain).    ELIQUIS 5 MG TABS tablet Take 1 tablet (5 mg total) by mouth 2 (two) times daily.    Multiple Vitamins-Minerals (OCUVITE PRESERVISION PO) Take 1 tablet by mouth every morning.  10/22/2020: On hold due to difficulty swallowing   amLODipine (NORVASC) 5 MG tablet Take 1 tablet (5 mg total) by mouth daily. (Patient not taking: Reported on 01/17/2021)    No facility-administered encounter medications on file as of 01/17/2021.    Functional Status:  In your present state of health, do you have any difficulty performing the following activities: 11/02/2020  Hearing? N  Vision? N  Difficulty concentrating or making decisions? N  Walking or climbing stairs? N  Dressing or bathing? N  Doing errands, shopping? Y  Some recent data might be hidden    Fall/Depression Screening: Fall Risk  01/17/2021  Falls in the past year? 0  Number falls in past yr: 0  Injury with Fall? 0  Risk for fall due to : Impaired vision   PHQ 2/9 Scores  01/17/2021  PHQ - 2 Score 0    Assessment:   Care Plan Care Plan : Atrial Fibrillation (Adult)  Updates made by Valente David, RN since 01/17/2021 12:00 AM     Problem: Stroke Risk (Atrial Fibrillation)      Long-Range Goal: Stroke Risk Minimized   Start Date: 01/17/2021  Expected End Date: 04/17/2021  Note:   Evidence-based guidance:  Provide anticipatory guidance regarding the signs/symptoms of stroke and heart failure.  Assess bleeding risk using a validated tool; review results with patient.  Assess and review stroke risk using a validated tool that include age and sex differences to determine choice, benefit and timing of anticoagulant therapy.  Prepare patient for use of pharmacologic therapy, such as nonvitamin K oral anticoagulant, vitamin K antagonist, low-molecular-weight heparin or antiplatelet (in combination with another agent) based on stroke risk score.  Monitor side effects and anticipate need for periodic adjustments.  Prepare patient for periodic coagulation laboratory testing when taking vitamin K antagonist; consider at-home monitoring and management.  Review alcohol risk screen or self-report of alcohol use (amount and frequency) when anticoagulant (especially vitamin K antagonist) is prescribed.  Encourage consistent consumption, not restriction, of dietary vitamin K, such as green, leafy vegetables, during warfarin therapy and especially during initiation phase.  Provide anticipatory guidance regarding the risk of bleeding, awareness of signs/symptoms of bleeding and minimizing bleeding risk, including fall and injury prevention.  Perform medication-adherence assessment when taking oral anticoagulants that do not require periodic monitoring  or when poor anticoagulation control based on monitoring is noted.  Address barriers to treatment adherence, such as cognitive function, illness, drug interaction, medication cost, presence of depression or anxiety and lifestyle  factors, including diet and alcohol consumption.  Promote shared decision-making using stroke and treatment benefits and risks, lifestyle and patient preferences to determine type of antithrombotic therapy.   Notes:     Task: Minimize Stroke Risk   Note:   Care Management Activities:    - barriers to treatment reviewed and addressed - fall and injury prevention strategies promoted - stroke risk screen reviewed    Notes:       Goals Addressed             This Visit's Progress    [facility] - [facility] My Quality of Life       Timeframe:  Long-Range Goal Priority:  High Start Date:    6/27                         Expected End Date:  9/27                     Follow Up Date 04/18/2021    - discuss my treatment options with the doctor or nurse - make shared treatment decisions with doctor    Why is this important?   Having a long-term illness can be scary.  It can also be stressful for you and your caregiver.  These steps may help.    Notes:          Plan:  Follow-up: Patient agrees to Care Plan and Follow-up. Follow-up in 3 month(s)  Valente David, RN, MSN [place] [phone]

## 2021-01-18 DIAGNOSIS — M6281 Muscle weakness (generalized): Secondary | ICD-10-CM | POA: Diagnosis not present

## 2021-01-18 DIAGNOSIS — I69315 Cognitive social or emotional deficit following cerebral infarction: Secondary | ICD-10-CM | POA: Diagnosis not present

## 2021-01-18 DIAGNOSIS — I69391 Dysphagia following cerebral infarction: Secondary | ICD-10-CM | POA: Diagnosis not present

## 2021-01-18 DIAGNOSIS — R262 Difficulty in walking, not elsewhere classified: Secondary | ICD-10-CM | POA: Diagnosis not present

## 2021-01-18 DIAGNOSIS — R2681 Unsteadiness on feet: Secondary | ICD-10-CM | POA: Diagnosis not present

## 2021-01-18 DIAGNOSIS — R1314 Dysphagia, pharyngoesophageal phase: Secondary | ICD-10-CM | POA: Diagnosis not present

## 2021-01-20 DIAGNOSIS — R2681 Unsteadiness on feet: Secondary | ICD-10-CM | POA: Diagnosis not present

## 2021-01-20 DIAGNOSIS — R1314 Dysphagia, pharyngoesophageal phase: Secondary | ICD-10-CM | POA: Diagnosis not present

## 2021-01-20 DIAGNOSIS — M6281 Muscle weakness (generalized): Secondary | ICD-10-CM | POA: Diagnosis not present

## 2021-01-20 DIAGNOSIS — I69391 Dysphagia following cerebral infarction: Secondary | ICD-10-CM | POA: Diagnosis not present

## 2021-01-20 DIAGNOSIS — R262 Difficulty in walking, not elsewhere classified: Secondary | ICD-10-CM | POA: Diagnosis not present

## 2021-01-20 DIAGNOSIS — I69315 Cognitive social or emotional deficit following cerebral infarction: Secondary | ICD-10-CM | POA: Diagnosis not present

## 2021-01-21 DIAGNOSIS — M6281 Muscle weakness (generalized): Secondary | ICD-10-CM | POA: Diagnosis not present

## 2021-01-21 DIAGNOSIS — R262 Difficulty in walking, not elsewhere classified: Secondary | ICD-10-CM | POA: Diagnosis not present

## 2021-01-21 DIAGNOSIS — R2681 Unsteadiness on feet: Secondary | ICD-10-CM | POA: Diagnosis not present

## 2021-01-21 DIAGNOSIS — R2689 Other abnormalities of gait and mobility: Secondary | ICD-10-CM | POA: Diagnosis not present

## 2021-01-24 DIAGNOSIS — Z1159 Encounter for screening for other viral diseases: Secondary | ICD-10-CM | POA: Diagnosis not present

## 2021-01-24 DIAGNOSIS — Z20828 Contact with and (suspected) exposure to other viral communicable diseases: Secondary | ICD-10-CM | POA: Diagnosis not present

## 2021-01-26 DIAGNOSIS — H3530 Unspecified macular degeneration: Secondary | ICD-10-CM | POA: Diagnosis not present

## 2021-01-26 DIAGNOSIS — I4891 Unspecified atrial fibrillation: Secondary | ICD-10-CM | POA: Diagnosis not present

## 2021-01-26 DIAGNOSIS — M6281 Muscle weakness (generalized): Secondary | ICD-10-CM | POA: Diagnosis not present

## 2021-01-26 DIAGNOSIS — G459 Transient cerebral ischemic attack, unspecified: Secondary | ICD-10-CM | POA: Diagnosis not present

## 2021-01-26 DIAGNOSIS — Z8546 Personal history of malignant neoplasm of prostate: Secondary | ICD-10-CM | POA: Diagnosis not present

## 2021-01-26 DIAGNOSIS — R2681 Unsteadiness on feet: Secondary | ICD-10-CM | POA: Diagnosis not present

## 2021-01-26 DIAGNOSIS — D6869 Other thrombophilia: Secondary | ICD-10-CM | POA: Diagnosis not present

## 2021-01-26 DIAGNOSIS — K224 Dyskinesia of esophagus: Secondary | ICD-10-CM | POA: Diagnosis not present

## 2021-01-26 DIAGNOSIS — R262 Difficulty in walking, not elsewhere classified: Secondary | ICD-10-CM | POA: Diagnosis not present

## 2021-01-26 DIAGNOSIS — R2689 Other abnormalities of gait and mobility: Secondary | ICD-10-CM | POA: Diagnosis not present

## 2021-01-26 DIAGNOSIS — I10 Essential (primary) hypertension: Secondary | ICD-10-CM | POA: Diagnosis not present

## 2021-01-28 DIAGNOSIS — M6281 Muscle weakness (generalized): Secondary | ICD-10-CM | POA: Diagnosis not present

## 2021-01-28 DIAGNOSIS — R2689 Other abnormalities of gait and mobility: Secondary | ICD-10-CM | POA: Diagnosis not present

## 2021-01-28 DIAGNOSIS — R262 Difficulty in walking, not elsewhere classified: Secondary | ICD-10-CM | POA: Diagnosis not present

## 2021-01-28 DIAGNOSIS — R2681 Unsteadiness on feet: Secondary | ICD-10-CM | POA: Diagnosis not present

## 2021-01-31 DIAGNOSIS — R2681 Unsteadiness on feet: Secondary | ICD-10-CM | POA: Diagnosis not present

## 2021-01-31 DIAGNOSIS — R262 Difficulty in walking, not elsewhere classified: Secondary | ICD-10-CM | POA: Diagnosis not present

## 2021-01-31 DIAGNOSIS — R2689 Other abnormalities of gait and mobility: Secondary | ICD-10-CM | POA: Diagnosis not present

## 2021-01-31 DIAGNOSIS — Z1159 Encounter for screening for other viral diseases: Secondary | ICD-10-CM | POA: Diagnosis not present

## 2021-01-31 DIAGNOSIS — Z20828 Contact with and (suspected) exposure to other viral communicable diseases: Secondary | ICD-10-CM | POA: Diagnosis not present

## 2021-01-31 DIAGNOSIS — M6281 Muscle weakness (generalized): Secondary | ICD-10-CM | POA: Diagnosis not present

## 2021-02-04 DIAGNOSIS — M6281 Muscle weakness (generalized): Secondary | ICD-10-CM | POA: Diagnosis not present

## 2021-02-04 DIAGNOSIS — R2689 Other abnormalities of gait and mobility: Secondary | ICD-10-CM | POA: Diagnosis not present

## 2021-02-04 DIAGNOSIS — R262 Difficulty in walking, not elsewhere classified: Secondary | ICD-10-CM | POA: Diagnosis not present

## 2021-02-04 DIAGNOSIS — R2681 Unsteadiness on feet: Secondary | ICD-10-CM | POA: Diagnosis not present

## 2021-02-07 DIAGNOSIS — Z961 Presence of intraocular lens: Secondary | ICD-10-CM | POA: Diagnosis not present

## 2021-02-07 DIAGNOSIS — H353113 Nonexudative age-related macular degeneration, right eye, advanced atrophic without subfoveal involvement: Secondary | ICD-10-CM | POA: Diagnosis not present

## 2021-02-07 DIAGNOSIS — R2689 Other abnormalities of gait and mobility: Secondary | ICD-10-CM | POA: Diagnosis not present

## 2021-02-07 DIAGNOSIS — R2681 Unsteadiness on feet: Secondary | ICD-10-CM | POA: Diagnosis not present

## 2021-02-07 DIAGNOSIS — H52201 Unspecified astigmatism, right eye: Secondary | ICD-10-CM | POA: Diagnosis not present

## 2021-02-07 DIAGNOSIS — R262 Difficulty in walking, not elsewhere classified: Secondary | ICD-10-CM | POA: Diagnosis not present

## 2021-02-07 DIAGNOSIS — Z20828 Contact with and (suspected) exposure to other viral communicable diseases: Secondary | ICD-10-CM | POA: Diagnosis not present

## 2021-02-07 DIAGNOSIS — M6281 Muscle weakness (generalized): Secondary | ICD-10-CM | POA: Diagnosis not present

## 2021-02-07 DIAGNOSIS — H353223 Exudative age-related macular degeneration, left eye, with inactive scar: Secondary | ICD-10-CM | POA: Diagnosis not present

## 2021-02-07 DIAGNOSIS — Z1159 Encounter for screening for other viral diseases: Secondary | ICD-10-CM | POA: Diagnosis not present

## 2021-02-08 ENCOUNTER — Ambulatory Visit: Payer: Medicare Other | Admitting: Interventional Cardiology

## 2021-02-10 DIAGNOSIS — R2689 Other abnormalities of gait and mobility: Secondary | ICD-10-CM | POA: Diagnosis not present

## 2021-02-10 DIAGNOSIS — R262 Difficulty in walking, not elsewhere classified: Secondary | ICD-10-CM | POA: Diagnosis not present

## 2021-02-10 DIAGNOSIS — M6281 Muscle weakness (generalized): Secondary | ICD-10-CM | POA: Diagnosis not present

## 2021-02-10 DIAGNOSIS — R2681 Unsteadiness on feet: Secondary | ICD-10-CM | POA: Diagnosis not present

## 2021-02-14 DIAGNOSIS — Z1159 Encounter for screening for other viral diseases: Secondary | ICD-10-CM | POA: Diagnosis not present

## 2021-02-14 DIAGNOSIS — Z20828 Contact with and (suspected) exposure to other viral communicable diseases: Secondary | ICD-10-CM | POA: Diagnosis not present

## 2021-02-16 DIAGNOSIS — R262 Difficulty in walking, not elsewhere classified: Secondary | ICD-10-CM | POA: Diagnosis not present

## 2021-02-16 DIAGNOSIS — R2689 Other abnormalities of gait and mobility: Secondary | ICD-10-CM | POA: Diagnosis not present

## 2021-02-16 DIAGNOSIS — M6281 Muscle weakness (generalized): Secondary | ICD-10-CM | POA: Diagnosis not present

## 2021-02-16 DIAGNOSIS — R2681 Unsteadiness on feet: Secondary | ICD-10-CM | POA: Diagnosis not present

## 2021-02-18 DIAGNOSIS — R2681 Unsteadiness on feet: Secondary | ICD-10-CM | POA: Diagnosis not present

## 2021-02-18 DIAGNOSIS — R2689 Other abnormalities of gait and mobility: Secondary | ICD-10-CM | POA: Diagnosis not present

## 2021-02-18 DIAGNOSIS — M6281 Muscle weakness (generalized): Secondary | ICD-10-CM | POA: Diagnosis not present

## 2021-02-18 DIAGNOSIS — R262 Difficulty in walking, not elsewhere classified: Secondary | ICD-10-CM | POA: Diagnosis not present

## 2021-02-20 ENCOUNTER — Other Ambulatory Visit: Payer: Self-pay | Admitting: Interventional Cardiology

## 2021-02-21 DIAGNOSIS — Z1159 Encounter for screening for other viral diseases: Secondary | ICD-10-CM | POA: Diagnosis not present

## 2021-02-21 DIAGNOSIS — Z20828 Contact with and (suspected) exposure to other viral communicable diseases: Secondary | ICD-10-CM | POA: Diagnosis not present

## 2021-02-21 NOTE — Telephone Encounter (Signed)
Prescription refill request for Eliquis received. Indication: Afib Last office visit: 10/22/20 Tamala Julian, MD) Scr: 0.78 (11/03/20) Age: 87 Weight: 76.2kg  Appropriate dose and refill sent to requested pharmacy.

## 2021-02-22 DIAGNOSIS — M6281 Muscle weakness (generalized): Secondary | ICD-10-CM | POA: Diagnosis not present

## 2021-02-22 DIAGNOSIS — R2689 Other abnormalities of gait and mobility: Secondary | ICD-10-CM | POA: Diagnosis not present

## 2021-02-22 DIAGNOSIS — R262 Difficulty in walking, not elsewhere classified: Secondary | ICD-10-CM | POA: Diagnosis not present

## 2021-02-22 DIAGNOSIS — R2681 Unsteadiness on feet: Secondary | ICD-10-CM | POA: Diagnosis not present

## 2021-02-24 DIAGNOSIS — Z20828 Contact with and (suspected) exposure to other viral communicable diseases: Secondary | ICD-10-CM | POA: Diagnosis not present

## 2021-02-24 DIAGNOSIS — Z1159 Encounter for screening for other viral diseases: Secondary | ICD-10-CM | POA: Diagnosis not present

## 2021-02-25 DIAGNOSIS — R2689 Other abnormalities of gait and mobility: Secondary | ICD-10-CM | POA: Diagnosis not present

## 2021-02-25 DIAGNOSIS — R262 Difficulty in walking, not elsewhere classified: Secondary | ICD-10-CM | POA: Diagnosis not present

## 2021-02-25 DIAGNOSIS — M6281 Muscle weakness (generalized): Secondary | ICD-10-CM | POA: Diagnosis not present

## 2021-02-25 DIAGNOSIS — R2681 Unsteadiness on feet: Secondary | ICD-10-CM | POA: Diagnosis not present

## 2021-02-28 DIAGNOSIS — Z8616 Personal history of COVID-19: Secondary | ICD-10-CM | POA: Diagnosis not present

## 2021-03-09 ENCOUNTER — Other Ambulatory Visit: Payer: Self-pay

## 2021-03-09 ENCOUNTER — Ambulatory Visit (INDEPENDENT_AMBULATORY_CARE_PROVIDER_SITE_OTHER): Payer: Medicare Other | Admitting: Podiatry

## 2021-03-09 ENCOUNTER — Encounter: Payer: Self-pay | Admitting: Podiatry

## 2021-03-09 DIAGNOSIS — M79675 Pain in left toe(s): Secondary | ICD-10-CM | POA: Diagnosis not present

## 2021-03-09 DIAGNOSIS — M79674 Pain in right toe(s): Secondary | ICD-10-CM

## 2021-03-09 DIAGNOSIS — Z20828 Contact with and (suspected) exposure to other viral communicable diseases: Secondary | ICD-10-CM | POA: Diagnosis not present

## 2021-03-09 DIAGNOSIS — B351 Tinea unguium: Secondary | ICD-10-CM | POA: Diagnosis not present

## 2021-03-09 DIAGNOSIS — D689 Coagulation defect, unspecified: Secondary | ICD-10-CM

## 2021-03-09 NOTE — Progress Notes (Signed)
Subjective:   Patient ID: Justin Potter, male   DOB: 87 y.o.   MRN: [id number]   HPI Patient presents with incurvated nailbeds 1-5 of both feet that get thick he cannot cut and is on blood thinner neuro   ROS      Objective:  Physical Exam  Vascular status intact with thick yellow brittle nailbeds 1-5 both feet thick and painful     Assessment:  Chronic mycotic nail infection with pain 1-5 both feet that he cannot take care of or caregiver     Plan:  Agreement painful nailbeds 1-5 both feet no iatrogenic bleeding noted reappoint 3 months

## 2021-03-16 DIAGNOSIS — R3912 Poor urinary stream: Secondary | ICD-10-CM | POA: Diagnosis not present

## 2021-03-16 DIAGNOSIS — R300 Dysuria: Secondary | ICD-10-CM | POA: Diagnosis not present

## 2021-03-16 DIAGNOSIS — N3942 Incontinence without sensory awareness: Secondary | ICD-10-CM | POA: Diagnosis not present

## 2021-03-16 DIAGNOSIS — N35012 Post-traumatic membranous urethral stricture: Secondary | ICD-10-CM | POA: Diagnosis not present

## 2021-03-21 DIAGNOSIS — Z20828 Contact with and (suspected) exposure to other viral communicable diseases: Secondary | ICD-10-CM | POA: Diagnosis not present

## 2021-03-28 DIAGNOSIS — Z8616 Personal history of COVID-19: Secondary | ICD-10-CM | POA: Diagnosis not present

## 2021-04-04 DIAGNOSIS — Z8616 Personal history of COVID-19: Secondary | ICD-10-CM | POA: Diagnosis not present

## 2021-04-11 DIAGNOSIS — Z8616 Personal history of COVID-19: Secondary | ICD-10-CM | POA: Diagnosis not present

## 2021-04-18 ENCOUNTER — Other Ambulatory Visit: Payer: Self-pay | Admitting: *Deleted

## 2021-04-18 DIAGNOSIS — Z85828 Personal history of other malignant neoplasm of skin: Secondary | ICD-10-CM | POA: Diagnosis not present

## 2021-04-18 DIAGNOSIS — L821 Other seborrheic keratosis: Secondary | ICD-10-CM | POA: Diagnosis not present

## 2021-04-18 DIAGNOSIS — D0471 Carcinoma in situ of skin of right lower limb, including hip: Secondary | ICD-10-CM | POA: Diagnosis not present

## 2021-04-18 DIAGNOSIS — Z8616 Personal history of COVID-19: Secondary | ICD-10-CM | POA: Diagnosis not present

## 2021-04-18 DIAGNOSIS — L570 Actinic keratosis: Secondary | ICD-10-CM | POA: Diagnosis not present

## 2021-04-18 DIAGNOSIS — Z85820 Personal history of malignant melanoma of skin: Secondary | ICD-10-CM | POA: Diagnosis not present

## 2021-04-18 NOTE — Patient Outreach (Signed)
[facility]) Care Management  04/18/2021  Justin Potter 04-Jun-1926 [id number]   Outgoing call placed to member's POA/son, state member is "fine."  Denies any urgent concerns, encouraged to contact this care manager with questions.  Agrees to follow up with health coach for ongoing disease management.  Will place referral.   Goals Addressed             This Visit's Progress    COMPLETED: [facility] - Matintain My Quality of Life   On track    Timeframe:  Long-Range Goal Priority:  High Start Date:    6/27                         Expected End Date:  9/27                       - discuss my treatment options with the doctor or nurse - make shared treatment decisions with doctor    Why is this important?   Having a long-term illness can be scary.  It can also be stressful for you and your caregiver.  These steps may help.    Notes:   9/26 - Son report member has remained stable, had visit from daughters from out of town over the weekend, no concerns.       Valente David, [place], MSN [place] [phone]

## 2021-04-19 ENCOUNTER — Other Ambulatory Visit: Payer: Self-pay | Admitting: *Deleted

## 2021-04-25 DIAGNOSIS — Z8616 Personal history of COVID-19: Secondary | ICD-10-CM | POA: Diagnosis not present

## 2021-05-02 DIAGNOSIS — Z20828 Contact with and (suspected) exposure to other viral communicable diseases: Secondary | ICD-10-CM | POA: Diagnosis not present

## 2021-05-09 DIAGNOSIS — Z8616 Personal history of COVID-19: Secondary | ICD-10-CM | POA: Diagnosis not present

## 2021-05-16 DIAGNOSIS — Z8616 Personal history of COVID-19: Secondary | ICD-10-CM | POA: Diagnosis not present

## 2021-05-18 ENCOUNTER — Other Ambulatory Visit: Payer: Self-pay | Admitting: *Deleted

## 2021-05-18 NOTE — Patient Outreach (Signed)
[facility]) Care Management  05/18/2021  JAVAUGHN OPDAHL [birth year] [id number]   RN Health Coach telephone call to patient.  [employer] compliance verified. RN spoke with Patient son Dr Valere Dross. Per son the patient resides in [place]. He sees him three times a week. His blood pressure is regular and Hr above 50. Per son he has not had any recent episodes of Afib. He stated the patient is doing very well and he has not needs at this time.   Plan: Case closure  [facility] Management [phone]

## 2021-05-30 DIAGNOSIS — Z20822 Contact with and (suspected) exposure to covid-19: Secondary | ICD-10-CM | POA: Diagnosis not present

## 2021-05-31 ENCOUNTER — Ambulatory Visit (INDEPENDENT_AMBULATORY_CARE_PROVIDER_SITE_OTHER): Payer: Medicare Other | Admitting: Ophthalmology

## 2021-05-31 ENCOUNTER — Other Ambulatory Visit: Payer: Self-pay

## 2021-05-31 ENCOUNTER — Encounter (INDEPENDENT_AMBULATORY_CARE_PROVIDER_SITE_OTHER): Payer: Self-pay | Admitting: Ophthalmology

## 2021-05-31 DIAGNOSIS — H353124 Nonexudative age-related macular degeneration, left eye, advanced atrophic with subfoveal involvement: Secondary | ICD-10-CM

## 2021-05-31 DIAGNOSIS — H353212 Exudative age-related macular degeneration, right eye, with inactive choroidal neovascularization: Secondary | ICD-10-CM | POA: Diagnosis not present

## 2021-05-31 DIAGNOSIS — H353114 Nonexudative age-related macular degeneration, right eye, advanced atrophic with subfoveal involvement: Secondary | ICD-10-CM

## 2021-05-31 DIAGNOSIS — H353222 Exudative age-related macular degeneration, left eye, with inactive choroidal neovascularization: Secondary | ICD-10-CM | POA: Diagnosis not present

## 2021-05-31 NOTE — Assessment & Plan Note (Signed)
No active portion of the lesion

## 2021-05-31 NOTE — Progress Notes (Signed)
05/31/2021     CHIEF COMPLAINT Patient presents for  Chief Complaint  Patient presents with   Retina Follow Up      HISTORY OF PRESENT ILLNESS: Justin Potter is a 87 y.o. male who presents to the clinic today for:   HPI     Retina Follow Up   Patient presents with  Dry AMD.  In both eyes.  This started 1 year ago.  Severity is moderate.  Duration of 1 year.  Since onset it is stable.  I, the attending physician,  performed the HPI with the patient and updated documentation appropriately.        Comments   1 yr fu OU oct. Patient states vision is stable and unchanged since last visit. Denies any new floaters or FOL. Pt states there is a bump on his LUL. Pt states thebump has been there for a couple weeks, gradually worsening, slightly pain to the touch. Pt is wondering what the bump is.      Last edited by Laurin Coder on 05/31/2021  1:06 PM.      Referring physician: [name], MD [address],  [place] [address]  HISTORICAL INFORMATION:   Selected notes from the [place]: No current outpatient medications on file. (Ophthalmic Drugs)   No current facility-administered medications for this visit. (Ophthalmic Drugs)   Current Outpatient Medications (Other)  Medication Sig   ELIQUIS 5 MG TABS tablet TAKE 1 TABLET TWICE A DAY   acetaminophen (TYLENOL) 500 MG tablet Take 500 mg by mouth daily as needed (pain).   amLODipine (NORVASC) 5 MG tablet Take 1 tablet (5 mg total) by mouth daily. (Patient not taking: Reported on 01/17/2021)   Multiple Vitamins-Minerals (OCUVITE PRESERVISION PO) Take 1 tablet by mouth every morning.    No current facility-administered medications for this visit. (Other)      REVIEW OF SYSTEMS:    ALLERGIES No Known Allergies  PAST MEDICAL HISTORY Past Medical History:  Diagnosis Date   Anxiety    Arthritis    "in the fingers" per pt   Atrial fibrillation  ([place])    Bladder neck contracture    Dysrhythmia    A-FIB / HX BRADYCARDIA - FOLLOWED BY DR. Ralston DUE TO [name]   Gross hematuria    History of gout    History of prostate cancer    S/P RADIOACTIVE SEED IMPLANTS   History of squamous cell carcinoma of skin    EXCISION LOWER LIP AND RIGHT EAR   History of TIA (transient ischemic attack)    probable tia no residual  09-09-2012   Hyperlipidemia    Hypertension    Macular degeneration    both eyes   Urethral stricture    Urinary retention    Past Surgical History:  Procedure Laterality Date   APPENDECTOMY     CATARACT EXTRACTION W/ INTRAOCULAR LENS  IMPLANT, BILATERAL     CHOLECYSTECTOMY  1977   CYSTOSCOPY WITH URETHRAL DILATATION N/A 02/12/2013   Procedure: CYSTOSCOPY WITH BALLOON DILATATION OF URETHRAL STRICTURE AND BLADDER FULGERATION;  Surgeon: Bernestine Amass, MD;  Location: [facility];  Service: Urology;  Laterality: N/A;   ESOPHAGOGASTRODUODENOSCOPY (EGD) WITH PROPOFOL N/A 11/02/2020   Procedure: ESOPHAGOGASTRODUODENOSCOPY (EGD) WITH PROPOFOL;  Surgeon: Clarene Essex, MD;  Location: [facility];  Service: Endoscopy;  Laterality: N/A;   LAPAROSCOPIC APPENDECTOMY N/A 08/22/2013   Procedure:  APPENDECTOMY LAPAROSCOPIC;  Surgeon: Pedro Earls, MD;  Location: [facility];  Service: General;  Laterality: N/A;   [place] N/A 11/02/2020   Procedure: SAVORY DILATION;  Surgeon: Clarene Essex, MD;  Location: [facility];  Service: Endoscopy;  Laterality: N/A;   TONSILLECTOMY  as child   TOTAL KNEE ARTHROPLASTY Right 11-23-2004    FAMILY HISTORY Family History  Problem Relation Age of Onset   Cancer Mother    Cancer Father     SOCIAL HISTORY Social History   Tobacco Use   Smoking status: Former    Packs/day: 1.50    Years: 35.00    Pack years: 52.50    Types: Cigarettes    Quit date: 01/30/1979    Years since quitting: 42.3   Smokeless tobacco: Never  Vaping  Use   Vaping Use: Never used  Substance Use Topics   Alcohol use: Yes    Comment: "5 days per week I drink 1 glass of wine"   Drug use: No         OPHTHALMIC EXAM:  Base Eye Exam     Visual Acuity (ETDRS)       Right Left   Dist cc 20/200 +1 HM   Dist ph cc NI     Correction: Glasses  HM temporally         Tonometry (Tonopen, 1:05 PM)       Right Left   Pressure 6 13         Pupils       Pupils Dark Light APD   Right PERRL 3 3 None   Left PERRL 3 3 +1         Visual Fields (Counting fingers)       Left Right     Full   Restrictions Total superior temporal, inferior temporal, superior nasal, inferior nasal deficiencies          Extraocular Movement       Right Left    Full Full         Neuro/Psych     Oriented x3: Yes   Mood/Affect: Normal         Dilation     Both eyes: 1.0% Mydriacyl, 2.5% Phenylephrine @ 1:05 PM           Slit Lamp and Fundus Exam     External Exam       Right Left   External Normal Normal         Slit Lamp Exam       Right Left   Lids/Lashes Normal Normal   Conjunctiva/Sclera White and quiet White and quiet   Cornea Clear Clear   Anterior Chamber Deep and quiet Deep and quiet   Iris Round and reactive Round and reactive   Lens Centered posterior chamber intraocular lens Centered posterior chamber intraocular lens   Anterior Vitreous Normal Normal         Fundus Exam       Right Left   Posterior Vitreous Posterior vitreous detachment Posterior vitreous detachment   Disc Normal, with small peripapillary CNVM nasal aspect small rim of hemorrhage not on the macular side Normal   C/D Ratio 0.5 0.5   Macula Retinal pigment epithelial atrophy - geographic, subfoveal approximately 7 disc areas in size Disciform scar, no active margins to the large disciform scar encompassing the entire clinicians macula   Vessels Normal Normal   Periphery Pavingstone degeneration Pavingstone  degeneration, with  peripheral chorioretinal scarring            IMAGING AND PROCEDURES  Imaging and Procedures for 05/31/21  OCT, Retina - OU - Both Eyes       Right Eye Quality was good. Scan locations included subfoveal. Central Foveal Thickness: 157. Findings include outer retinal atrophy, central retinal atrophy, abnormal foveal contour.   Left Eye Quality was good. Scan locations included subfoveal. Central Foveal Thickness: 235. Findings include abnormal foveal contour, disciform scar.   Notes Geographic atrophy OD, no clinically discernible progression  OS with massive old disciform scar, no Appreciable active edges within the clinicians macula             ASSESSMENT/PLAN:  Exudative age-related macular degeneration of right eye with inactive choroidal neovascularization (HCC) OD no signs of active CNVM on the clinician side of the macula.  We will watch the peripapillary small hemorrhage nasally  Exudative age-related macular degeneration of left eye with inactive choroidal neovascularization (HCC) No active portion of the lesion  Advanced nonexudative age-related macular degeneration of right eye with subfoveal involvement Accounts for acuity  Advanced nonexudative age-related macular degeneration of left eye with subfoveal involvement Major role in acuity     ICD-10-CM   1. Advanced nonexudative age-related macular degeneration of left eye with subfoveal involvement  H35.3124 OCT, Retina - OU - Both Eyes    2. Exudative age-related macular degeneration of right eye with inactive choroidal neovascularization (HCC)  H35.3212 OCT, Retina - OU - Both Eyes    3. Exudative age-related macular degeneration of left eye with inactive choroidal neovascularization ([place])  H35.3222     4. Advanced nonexudative age-related macular degeneration of right eye with subfoveal involvement  H35.3114       OU stable overall.  No signs of change in either eye  2.  Patient is doing well  using side vision and peripheral vision to function and his activities of daily living  3.  Ophthalmic Meds Ordered this visit:  No orders of the defined types were placed in this encounter.      Return in about 1 year (around 05/31/2022) for DILATE OU, COLOR FP, OCT.  There are no Patient Instructions on file for this visit.   Explained the diagnoses, plan, and follow up with the patient and they expressed understanding.  Patient expressed understanding of the importance of proper follow up care.   Clent Demark Allecia Bells M.D. Diseases & Surgery of the Retina and Vitreous Retina & Diabetic [place] 05/31/21     Abbreviations: M myopia (nearsighted); A astigmatism; H hyperopia (farsighted); P presbyopia; Mrx spectacle prescription;  CTL contact lenses; OD right eye; OS left eye; OU both eyes  XT exotropia; ET esotropia; PEK punctate epithelial keratitis; PEE punctate epithelial erosions; DES dry eye syndrome; MGD meibomian gland dysfunction; ATs artificial tears; PFAT's preservative free artificial tears; [place] nuclear sclerotic cataract; PSC posterior subcapsular cataract; ERM epi-retinal membrane; PVD posterior vitreous detachment; RD retinal detachment; DM diabetes mellitus; DR diabetic retinopathy; NPDR non-proliferative diabetic retinopathy; PDR proliferative diabetic retinopathy; CSME clinically significant macular edema; DME diabetic macular edema; dbh dot blot hemorrhages; CWS cotton wool spot; POAG primary open angle glaucoma; C/D cup-to-disc ratio; HVF humphrey visual field; GVF goldmann visual field; OCT optical coherence tomography; IOP intraocular pressure; BRVO Branch retinal vein occlusion; CRVO central retinal vein occlusion; CRAO central retinal artery occlusion; BRAO branch retinal artery occlusion; RT retinal tear; SB scleral buckle; PPV pars plana vitrectomy; VH Vitreous hemorrhage; PRP  panretinal laser photocoagulation; IVK intravitreal kenalog; VMT vitreomacular traction; MH  Macular hole;  NVD neovascularization of the disc; NVE neovascularization elsewhere; AREDS age related eye disease study; ARMD age related macular degeneration; POAG primary open angle glaucoma; EBMD epithelial/anterior basement membrane dystrophy; ACIOL anterior chamber intraocular lens; IOL intraocular lens; PCIOL posterior chamber intraocular lens; Phaco/IOL phacoemulsification with intraocular lens placement; [place] photorefractive keratectomy; LASIK laser assisted in situ keratomileusis; HTN hypertension; DM diabetes mellitus; COPD chronic obstructive pulmonary disease

## 2021-05-31 NOTE — Assessment & Plan Note (Signed)
OD no signs of active CNVM on the clinician side of the macula.  We will watch the peripapillary small hemorrhage nasally

## 2021-05-31 NOTE — Assessment & Plan Note (Signed)
Accounts for acuity 

## 2021-05-31 NOTE — Assessment & Plan Note (Signed)
Major role in acuity

## 2021-06-06 DIAGNOSIS — Z20828 Contact with and (suspected) exposure to other viral communicable diseases: Secondary | ICD-10-CM | POA: Diagnosis not present

## 2021-06-09 ENCOUNTER — Encounter: Payer: Self-pay | Admitting: Podiatry

## 2021-06-09 ENCOUNTER — Ambulatory Visit (INDEPENDENT_AMBULATORY_CARE_PROVIDER_SITE_OTHER): Payer: Medicare Other | Admitting: Podiatry

## 2021-06-09 ENCOUNTER — Other Ambulatory Visit: Payer: Self-pay

## 2021-06-09 DIAGNOSIS — M79674 Pain in right toe(s): Secondary | ICD-10-CM | POA: Diagnosis not present

## 2021-06-09 DIAGNOSIS — D689 Coagulation defect, unspecified: Secondary | ICD-10-CM

## 2021-06-09 DIAGNOSIS — L84 Corns and callosities: Secondary | ICD-10-CM

## 2021-06-09 DIAGNOSIS — M79675 Pain in left toe(s): Secondary | ICD-10-CM

## 2021-06-09 DIAGNOSIS — B351 Tinea unguium: Secondary | ICD-10-CM

## 2021-06-09 NOTE — Progress Notes (Signed)
Subjective:   Patient ID: Justin Potter, male   DOB: 87 y.o.   MRN: [id number]   HPI Patient presents with nail disease 1-5 both feet that are incurvated and lesions bilateral feet with patient on blood thinner   ROS      Objective:  Physical Exam  Neurovascular status unchanged with thick yellow brittle nailbeds 1-5 both feet that get sore and lesions plantar aspect both feet that are bothersome and make shoe gear difficult with at risk condition     Assessment:  Chronic keratotic lesion formation with mycotic nail infection 1-5 both feet     Plan:  H&P debrided lesions and debrided nailbeds no iatrogenic bleeding reappoint routine care

## 2021-06-13 DIAGNOSIS — Z20822 Contact with and (suspected) exposure to covid-19: Secondary | ICD-10-CM | POA: Diagnosis not present

## 2021-06-20 DIAGNOSIS — Z1159 Encounter for screening for other viral diseases: Secondary | ICD-10-CM | POA: Diagnosis not present

## 2021-06-20 DIAGNOSIS — Z20828 Contact with and (suspected) exposure to other viral communicable diseases: Secondary | ICD-10-CM | POA: Diagnosis not present

## 2021-06-27 DIAGNOSIS — Z1159 Encounter for screening for other viral diseases: Secondary | ICD-10-CM | POA: Diagnosis not present

## 2021-06-27 DIAGNOSIS — Z20828 Contact with and (suspected) exposure to other viral communicable diseases: Secondary | ICD-10-CM | POA: Diagnosis not present

## 2021-07-04 DIAGNOSIS — Z20828 Contact with and (suspected) exposure to other viral communicable diseases: Secondary | ICD-10-CM | POA: Diagnosis not present

## 2021-07-04 DIAGNOSIS — Z1159 Encounter for screening for other viral diseases: Secondary | ICD-10-CM | POA: Diagnosis not present

## 2021-07-06 DIAGNOSIS — Z1159 Encounter for screening for other viral diseases: Secondary | ICD-10-CM | POA: Diagnosis not present

## 2021-07-06 DIAGNOSIS — Z20828 Contact with and (suspected) exposure to other viral communicable diseases: Secondary | ICD-10-CM | POA: Diagnosis not present

## 2021-07-08 DIAGNOSIS — Z1159 Encounter for screening for other viral diseases: Secondary | ICD-10-CM | POA: Diagnosis not present

## 2021-07-08 DIAGNOSIS — Z20828 Contact with and (suspected) exposure to other viral communicable diseases: Secondary | ICD-10-CM | POA: Diagnosis not present

## 2021-07-21 ENCOUNTER — Encounter: Payer: Self-pay | Admitting: Podiatry

## 2021-07-21 ENCOUNTER — Other Ambulatory Visit: Payer: Self-pay

## 2021-07-21 ENCOUNTER — Ambulatory Visit (INDEPENDENT_AMBULATORY_CARE_PROVIDER_SITE_OTHER): Payer: Medicare Other | Admitting: Podiatry

## 2021-07-21 DIAGNOSIS — D689 Coagulation defect, unspecified: Secondary | ICD-10-CM | POA: Diagnosis not present

## 2021-07-21 DIAGNOSIS — L84 Corns and callosities: Secondary | ICD-10-CM

## 2021-07-21 NOTE — Progress Notes (Signed)
Subjective:   Patient ID: Justin Potter, male   DOB: 87 y.o.   MRN: [id number]   HPI Patient presents with a corns that become sore again on the bottom of his feet that he cannot take care of doing on blood thinner advanced age   ROS      Objective:  Physical Exam  Neurovascular status intact with keratotic lesions some left foot that are painful     Assessment:  Chronic lesion formation with at risk condition     Plan:  Careful sterile debridement of lesions accomplished no iatrogenic bleeding reappoint routine care

## 2021-07-25 DIAGNOSIS — Z20822 Contact with and (suspected) exposure to covid-19: Secondary | ICD-10-CM | POA: Diagnosis not present

## 2021-08-01 DIAGNOSIS — Z20822 Contact with and (suspected) exposure to covid-19: Secondary | ICD-10-CM | POA: Diagnosis not present

## 2021-08-04 DIAGNOSIS — E7800 Pure hypercholesterolemia, unspecified: Secondary | ICD-10-CM | POA: Diagnosis not present

## 2021-08-04 DIAGNOSIS — I480 Paroxysmal atrial fibrillation: Secondary | ICD-10-CM | POA: Diagnosis not present

## 2021-08-04 DIAGNOSIS — I10 Essential (primary) hypertension: Secondary | ICD-10-CM | POA: Diagnosis not present

## 2021-08-04 DIAGNOSIS — D6869 Other thrombophilia: Secondary | ICD-10-CM | POA: Diagnosis not present

## 2021-08-04 DIAGNOSIS — G459 Transient cerebral ischemic attack, unspecified: Secondary | ICD-10-CM | POA: Diagnosis not present

## 2021-08-04 DIAGNOSIS — Z0000 Encounter for general adult medical examination without abnormal findings: Secondary | ICD-10-CM | POA: Diagnosis not present

## 2021-08-04 DIAGNOSIS — R269 Unspecified abnormalities of gait and mobility: Secondary | ICD-10-CM | POA: Diagnosis not present

## 2021-08-04 DIAGNOSIS — R202 Paresthesia of skin: Secondary | ICD-10-CM | POA: Diagnosis not present

## 2021-08-04 DIAGNOSIS — Z8546 Personal history of malignant neoplasm of prostate: Secondary | ICD-10-CM | POA: Diagnosis not present

## 2021-08-04 DIAGNOSIS — M109 Gout, unspecified: Secondary | ICD-10-CM | POA: Diagnosis not present

## 2021-08-04 DIAGNOSIS — M1990 Unspecified osteoarthritis, unspecified site: Secondary | ICD-10-CM | POA: Diagnosis not present

## 2021-08-04 DIAGNOSIS — Z1389 Encounter for screening for other disorder: Secondary | ICD-10-CM | POA: Diagnosis not present

## 2021-08-08 DIAGNOSIS — Z20828 Contact with and (suspected) exposure to other viral communicable diseases: Secondary | ICD-10-CM | POA: Diagnosis not present

## 2021-08-09 NOTE — Progress Notes (Signed)
Cardiology Office Note:    Date:  08/10/2021   [name], DOB 1925/11/25, MRN [id number]  PCP:  [name], MD  Cardiologist:  [name], MD   Referring MD: [name], MD   Chief Complaint  Patient presents with   Atrial Fibrillation    History of Present Illness:    Justin Potter is a 88 y.o. male with a hx of  atrial fibrillation, chronic anticoagulation therapy, bradycardia, and h/o hypertension. Here for preoperative eval prior to esophageal dilatation.  He has suffered with low blood pressures.  He is also had slow ventricular response related to atrial fibrillation.  He did undergo esophageal dilatation without issue.  He is discontinued amlodipine.  Blood pressure log reveals blood pressures are running between 110 and 106 mmHg systolic with diastolics between 50 and 70 mmHg.  Heart rates run between 45 and 70.  Past Medical History:  Diagnosis Date   Anxiety    Arthritis    "in the fingers" per pt   Atrial fibrillation ([place])    Bladder neck contracture    Dysrhythmia    A-FIB / HX BRADYCARDIA - FOLLOWED BY DR. [place] DUE TO [name]   Gross hematuria    History of gout    History of prostate cancer    S/P RADIOACTIVE SEED IMPLANTS   History of squamous cell carcinoma of skin    EXCISION LOWER LIP AND RIGHT EAR   History of TIA (transient ischemic attack)    probable tia no residual  09-09-2012   Hyperlipidemia    Hypertension    Macular degeneration    both eyes   Urethral stricture    Urinary retention     Past Surgical History:  Procedure Laterality Date   APPENDECTOMY     CATARACT EXTRACTION W/ INTRAOCULAR LENS  IMPLANT, BILATERAL     CHOLECYSTECTOMY  1977   CYSTOSCOPY WITH URETHRAL DILATATION N/A 02/12/2013   Procedure: CYSTOSCOPY WITH BALLOON DILATATION OF URETHRAL STRICTURE AND BLADDER FULGERATION;  Surgeon: Bernestine Amass, MD;  Location: [facility];  Service: Urology;  Laterality: N/A;    ESOPHAGOGASTRODUODENOSCOPY (EGD) WITH PROPOFOL N/A 11/02/2020   Procedure: ESOPHAGOGASTRODUODENOSCOPY (EGD) WITH PROPOFOL;  Surgeon: Clarene Essex, MD;  [facility];  Service: Endoscopy;  Laterality: N/A;   LAPAROSCOPIC APPENDECTOMY N/A 08/22/2013   Procedure: APPENDECTOMY LAPAROSCOPIC;  Surgeon: Pedro Earls, MD;  Location: [facility];  Service: General;  Laterality: N/A;   [place] N/A 11/02/2020   Procedure: SAVORY DILATION;  Surgeon: Clarene Essex, MD;  [facility];  Service: Endoscopy;  Laterality: N/A;   TONSILLECTOMY  as child   TOTAL KNEE ARTHROPLASTY Right 11-23-2004    Current Medications: Current Meds  Medication Sig   acetaminophen (TYLENOL) 500 MG tablet Take 500 mg by mouth daily as needed (pain).   ELIQUIS 5 MG TABS tablet TAKE 1 TABLET TWICE A DAY   Multiple Vitamins-Minerals (OCUVITE PRESERVISION PO) Take 1 tablet by mouth every morning.      Allergies:   Patient has no known allergies.   Social History   Socioeconomic History   Marital status: Married    Spouse name: Not on file   Number of children: Not on file   Years of education: Not on file   Highest education level: Not on file  Occupational History   Not on file  Tobacco Use   Smoking status: Former  Packs/day: 1.50    Years: 35.00    Pack years: 52.50    Types: Cigarettes    Quit date: 01/30/1979    Years since quitting: 42.5   Smokeless tobacco: Never  Vaping Use   Vaping Use: Never used  Substance and Sexual Activity   Alcohol use: Yes    Comment: "5 days per week I drink 1 glass of wine"   Drug use: No   Sexual activity: Not Currently  Other Topics Concern   Not on file  Social History Narrative   Not on file   Social Determinants of Health   Financial Resource Strain: Not on file  Food Insecurity: No Food Insecurity   Worried About Running Out of Food in the Last Year: Never true   Ran Out of Food in the Last Year:  Never true  Transportation Needs: No Transportation Needs   Lack of Transportation (Medical): No   Lack of Transportation (Non-Medical): No  Physical Activity: Not on file  Stress: Not on file  Social Connections: Not on file     Family History: The patient's family history includes Cancer in his father and mother.  ROS:   Please see the history of present illness.    No blood in the urine or stool.  No medication side effects.  All other systems reviewed and are negative.  EKGs/Labs/Other Studies Reviewed:    The following studies were reviewed today: No imaging.  EKG:  EKG atrial fibrillation with slow ventricular response at 52 bpm, indeterminate axis, low voltage,  and when compared to October 22, 2020, faster and the voltage is less on the current tracing.  Recent Labs: 11/02/2020: TSH 1.380 11/03/2020: ALT 15; BUN 12; Creatinine, Ser 0.78; Hemoglobin 14.1; Magnesium 1.8; Platelets 181; Potassium 4.0; Sodium 138  Recent Lipid Panel No results found for: CHOL, TRIG, HDL, CHOLHDL, VLDL, LDLCALC, LDLDIRECT  Physical Exam:    VS:  BP (!) 142/90    Pulse (!) 52    Ht 6\' 2"  (1.88 m)    Wt 175 lb 9.6 oz (79.7 kg)    SpO2 98%    BMI 22.55 kg/m     Wt Readings from Last 3 Encounters:  08/10/21 175 lb 9.6 oz (79.7 kg)  11/03/20 167 lb 15.9 oz (76.2 kg)  10/22/20 172 lb 12.8 oz (78.4 kg)     GEN: Slender and compatible with age. No acute distress HEENT: Normal NECK: No JVD. LYMPHATICS: No lymphadenopathy CARDIAC: No murmur.  Slow IIRR no gallop, and trace bilateral ankle edema.  Has on compression stockings VASCULAR:  Normal Pulses. No bruits. RESPIRATORY:  Clear to auscultation without rales, wheezing or rhonchi  ABDOMEN: Soft, non-tender, non-distended, No pulsatile mass, MUSCULOSKELETAL: No deformity  SKIN: Warm and dry NEUROLOGIC:  Alert and oriented x 3 PSYCHIATRIC:  Normal affect   ASSESSMENT:    1. Persistent atrial fibrillation ([place])   2. Long term current use of  anticoagulant therapy   3. Essential hypertension   4. Mixed hyperlipidemia   5. Acquired thrombophilia ([place])    PLAN:    In order of problems listed above:  Controlled rate, on the slow side but unchanged from prior.  No instances of syncope.  Continue close clinical follow-up and monitoring as a several times per week check blood pressure and heart rate. Continue current dose of Eliquis 5 mg twice daily creatinine 0.98, hemoglobin 12.4 August 04, 2021. Blood pressure is in reasonable range between 130/70 and 150/90 mmHg with target 140/80  mmHg. We are not treating lipids. Continue Eliquis to prevent embolic stroke.   Medication Adjustments/Labs and Tests Ordered: Current medicines are reviewed at length with the patient today.  Concerns regarding medicines are outlined above.  No orders of the defined types were placed in this encounter.  No orders of the defined types were placed in this encounter.   There are no Patient Instructions on file for this visit.   Signed, [name], MD  08/10/2021 1:21 PM    [facility]

## 2021-08-10 ENCOUNTER — Ambulatory Visit (INDEPENDENT_AMBULATORY_CARE_PROVIDER_SITE_OTHER): Payer: Medicare Other | Admitting: Interventional Cardiology

## 2021-08-10 ENCOUNTER — Other Ambulatory Visit: Payer: Self-pay

## 2021-08-10 ENCOUNTER — Encounter: Payer: Self-pay | Admitting: Interventional Cardiology

## 2021-08-10 VITALS — BP 142/90 | HR 52 | Ht 74.0 in | Wt 175.6 lb

## 2021-08-10 DIAGNOSIS — Z7901 Long term (current) use of anticoagulants: Secondary | ICD-10-CM | POA: Diagnosis not present

## 2021-08-10 DIAGNOSIS — E782 Mixed hyperlipidemia: Secondary | ICD-10-CM

## 2021-08-10 DIAGNOSIS — D6869 Other thrombophilia: Secondary | ICD-10-CM | POA: Diagnosis not present

## 2021-08-10 DIAGNOSIS — I4819 Other persistent atrial fibrillation: Secondary | ICD-10-CM

## 2021-08-10 DIAGNOSIS — I10 Essential (primary) hypertension: Secondary | ICD-10-CM

## 2021-08-10 NOTE — Patient Instructions (Signed)
Medication Instructions:  Your physician recommends that you continue on your current medications as directed. Please refer to the Current Medication list given to you today.] *If you need a refill on your cardiac medications before your next appointment, please call your pharmacy*   Lab Work: none If you have labs (blood work) drawn today and your tests are completely normal, you will receive your results only by: [place] (if you have MyChart) OR A paper copy in the mail If you have any lab test that is abnormal or we need to change your treatment, we will call you to review the results.   Testing/Procedures: none   Follow-Up: At [facility], you and your health needs are our priority.  As part of our continuing mission to provide you with exceptional heart care, we have created designated Provider Care Teams.  These Care Teams include your primary Cardiologist (physician) and Advanced Practice Providers (APPs -  Physician Assistants and Nurse Practitioners) who all work together to provide you with the care you need, when you need it.  We recommend signing up for the patient portal called "MyChart".  Sign up information is provided on this After Visit Summary.  MyChart is used to connect with patients for Virtual Visits (Telemedicine).  Patients are able to view lab/test results, encounter notes, upcoming appointments, etc.  Non-urgent messages can be sent to your provider as well.   To learn more about what you can do with MyChart, go to [email or web address].    Your next appointment:   1 year(s)  The format for your next appointment:   In Person  Provider:   [name], MD     Other Instructions

## 2021-08-27 ENCOUNTER — Other Ambulatory Visit: Payer: Self-pay | Admitting: Interventional Cardiology

## 2021-08-29 NOTE — Telephone Encounter (Signed)
Pt last saw Dr Tamala Julian 08/10/21, last labs 11/03/20 Creat 0.78, age 88, weight 79.7kg, based on specified criteria pt is on appropriate dosage of Eliquis 5mg  BID for afib.  Will refill rx.

## 2021-09-14 DIAGNOSIS — R351 Nocturia: Secondary | ICD-10-CM | POA: Diagnosis not present

## 2021-09-14 DIAGNOSIS — N3942 Incontinence without sensory awareness: Secondary | ICD-10-CM | POA: Diagnosis not present

## 2021-09-14 DIAGNOSIS — N35012 Post-traumatic membranous urethral stricture: Secondary | ICD-10-CM | POA: Diagnosis not present

## 2021-10-03 ENCOUNTER — Other Ambulatory Visit: Payer: Self-pay

## 2021-10-03 ENCOUNTER — Ambulatory Visit (INDEPENDENT_AMBULATORY_CARE_PROVIDER_SITE_OTHER): Payer: Medicare Other | Admitting: Podiatry

## 2021-10-03 DIAGNOSIS — M79675 Pain in left toe(s): Secondary | ICD-10-CM | POA: Diagnosis not present

## 2021-10-03 DIAGNOSIS — M79674 Pain in right toe(s): Secondary | ICD-10-CM | POA: Diagnosis not present

## 2021-10-03 DIAGNOSIS — D689 Coagulation defect, unspecified: Secondary | ICD-10-CM

## 2021-10-03 DIAGNOSIS — L84 Corns and callosities: Secondary | ICD-10-CM | POA: Diagnosis not present

## 2021-10-03 DIAGNOSIS — B351 Tinea unguium: Secondary | ICD-10-CM

## 2021-10-03 NOTE — Progress Notes (Signed)
Subjective:  ? ?Patient ID: Justin Potter, male   DOB: 88 y.o.   MRN: [id number]  ? ?HPI ?Patient presents with 2 lesions left foot which are painful and thick yellow brittle nails 1-5 both feet thickened incurvated the corner and sore ? ? ?ROS ? ? ?   ?Objective:  ?Physical Exam  ?Mycotic nail infection 1-5 both feet lesion formation x2 left with patient on blood thinner with high risk ? ?   ?Assessment:  ?Chronic mycotic painful nail disease 1-5 both feet and lesions x2 left with patient on blood thinner ? ?   ?Plan:  ?Debridement of nailbeds 1-5 both feet neurogenic bleeding debridement of lesions left no iatrogenic bleeding reappoint routine care ?   ? ? ?

## 2021-11-21 ENCOUNTER — Other Ambulatory Visit: Payer: Self-pay | Admitting: *Deleted

## 2021-11-21 DIAGNOSIS — Z20822 Contact with and (suspected) exposure to covid-19: Secondary | ICD-10-CM | POA: Diagnosis not present

## 2021-11-21 DIAGNOSIS — I4819 Other persistent atrial fibrillation: Secondary | ICD-10-CM

## 2021-11-21 MED ORDER — ELIQUIS 5 MG PO TABS: 5.0000 mg | ORAL_TABLET | Freq: Two times a day (BID) | ORAL | 2 refills | Status: DC

## 2021-11-21 NOTE — Telephone Encounter (Signed)
Eliquis '5mg'$  paper refill request received. Patient is 88 years old, weight-79.7kg, Crea-0.98 on 08/04/2021 via [place] from [place], [place], and last seen by Dr. Tamala Julian on 08/10/2021. Dose is appropriate based on dosing criteria. Will send in refill to requested pharmacy.   ?

## 2022-01-02 ENCOUNTER — Encounter: Payer: Self-pay | Admitting: Podiatry

## 2022-01-02 ENCOUNTER — Ambulatory Visit (INDEPENDENT_AMBULATORY_CARE_PROVIDER_SITE_OTHER): Payer: Medicare Other | Admitting: Podiatry

## 2022-01-02 DIAGNOSIS — D689 Coagulation defect, unspecified: Secondary | ICD-10-CM | POA: Diagnosis not present

## 2022-01-02 DIAGNOSIS — L84 Corns and callosities: Secondary | ICD-10-CM | POA: Diagnosis not present

## 2022-01-02 DIAGNOSIS — B351 Tinea unguium: Secondary | ICD-10-CM | POA: Diagnosis not present

## 2022-01-02 DIAGNOSIS — M79674 Pain in right toe(s): Secondary | ICD-10-CM

## 2022-01-02 DIAGNOSIS — M79675 Pain in left toe(s): Secondary | ICD-10-CM

## 2022-01-02 NOTE — Progress Notes (Signed)
Subjective:   Patient ID: Justin Potter, male   DOB: 88 y.o.   MRN: [id number]   HPI Patient presents with elongated nails and painful lesions plantar left with patient on blood thinner and cannot take care of himself   ROS      Objective:  Physical Exam  Neurovascular status unchanged with thick yellow brittle nailbeds 1-5 both feet that are painful and incurvated in the corners and lesions of third subfifth metatarsals left painful when pressed with patient on blood thinner     Assessment:  At risk patient with mycotic nail infection 1-5 both feet and lesion formation x2 left     Plan:  H&P reviewed condition debrided nailbeds 1-5 both feet and debrided 2 lesions left no angiogenic bleeding reappoint routine care

## 2022-02-08 DIAGNOSIS — R269 Unspecified abnormalities of gait and mobility: Secondary | ICD-10-CM | POA: Diagnosis not present

## 2022-02-08 DIAGNOSIS — I4891 Unspecified atrial fibrillation: Secondary | ICD-10-CM | POA: Diagnosis not present

## 2022-02-08 DIAGNOSIS — H3530 Unspecified macular degeneration: Secondary | ICD-10-CM | POA: Diagnosis not present

## 2022-02-08 DIAGNOSIS — R001 Bradycardia, unspecified: Secondary | ICD-10-CM | POA: Diagnosis not present

## 2022-02-08 DIAGNOSIS — Z8546 Personal history of malignant neoplasm of prostate: Secondary | ICD-10-CM | POA: Diagnosis not present

## 2022-02-08 DIAGNOSIS — I10 Essential (primary) hypertension: Secondary | ICD-10-CM | POA: Diagnosis not present

## 2022-02-08 DIAGNOSIS — H548 Legal blindness, as defined in USA: Secondary | ICD-10-CM | POA: Diagnosis not present

## 2022-02-08 DIAGNOSIS — D6869 Other thrombophilia: Secondary | ICD-10-CM | POA: Diagnosis not present

## 2022-02-08 DIAGNOSIS — G459 Transient cerebral ischemic attack, unspecified: Secondary | ICD-10-CM | POA: Diagnosis not present

## 2022-02-14 DIAGNOSIS — M5459 Other low back pain: Secondary | ICD-10-CM | POA: Diagnosis not present

## 2022-02-14 DIAGNOSIS — M6281 Muscle weakness (generalized): Secondary | ICD-10-CM | POA: Diagnosis not present

## 2022-02-14 DIAGNOSIS — R2681 Unsteadiness on feet: Secondary | ICD-10-CM | POA: Diagnosis not present

## 2022-02-14 DIAGNOSIS — R2689 Other abnormalities of gait and mobility: Secondary | ICD-10-CM | POA: Diagnosis not present

## 2022-02-15 DIAGNOSIS — H52203 Unspecified astigmatism, bilateral: Secondary | ICD-10-CM | POA: Diagnosis not present

## 2022-02-15 DIAGNOSIS — M6281 Muscle weakness (generalized): Secondary | ICD-10-CM | POA: Diagnosis not present

## 2022-02-15 DIAGNOSIS — R2681 Unsteadiness on feet: Secondary | ICD-10-CM | POA: Diagnosis not present

## 2022-02-15 DIAGNOSIS — Z961 Presence of intraocular lens: Secondary | ICD-10-CM | POA: Diagnosis not present

## 2022-02-15 DIAGNOSIS — M5459 Other low back pain: Secondary | ICD-10-CM | POA: Diagnosis not present

## 2022-02-15 DIAGNOSIS — R2689 Other abnormalities of gait and mobility: Secondary | ICD-10-CM | POA: Diagnosis not present

## 2022-02-15 DIAGNOSIS — H353211 Exudative age-related macular degeneration, right eye, with active choroidal neovascularization: Secondary | ICD-10-CM | POA: Diagnosis not present

## 2022-02-16 ENCOUNTER — Encounter (INDEPENDENT_AMBULATORY_CARE_PROVIDER_SITE_OTHER): Payer: Self-pay | Admitting: Ophthalmology

## 2022-02-16 ENCOUNTER — Ambulatory Visit (INDEPENDENT_AMBULATORY_CARE_PROVIDER_SITE_OTHER): Payer: Medicare Other | Admitting: Ophthalmology

## 2022-02-16 DIAGNOSIS — H353124 Nonexudative age-related macular degeneration, left eye, advanced atrophic with subfoveal involvement: Secondary | ICD-10-CM

## 2022-02-16 DIAGNOSIS — H353212 Exudative age-related macular degeneration, right eye, with inactive choroidal neovascularization: Secondary | ICD-10-CM

## 2022-02-16 DIAGNOSIS — H353211 Exudative age-related macular degeneration, right eye, with active choroidal neovascularization: Secondary | ICD-10-CM | POA: Diagnosis not present

## 2022-02-16 MED ORDER — BEVACIZUMAB CHEMO INJECTION 1.25MG/0.05ML SYRINGE FOR KALEIDOSCOPE
1.2500 mg | INTRAVITREAL | Status: AC | PRN
  Administered 2022-02-16: 1.25 mg via INTRAVITREAL

## 2022-02-16 NOTE — Assessment & Plan Note (Signed)
Active large region of subretinal hemorrhage.  Goal is to quiet the active CNVM so as to minimize chance of scotoma enlargement and maintain 20/200 vision in the right eye and prevent vision drop equivalent to the left eye with massive disciform scarring  We will restart Avastin OD today and reevaluate again in 3 months

## 2022-02-16 NOTE — Progress Notes (Signed)
02/16/2022     CHIEF COMPLAINT Patient presents for  Chief Complaint  Patient presents with   Macular Degeneration      HISTORY OF PRESENT ILLNESS: Justin Potter is a 88 y.o. male who presents to the clinic today for:   HPI   New onset massive retinal hemorrhage in the macula found by Dr. Luberta Mutter superiorly in the right eye in the eye with still ambulatory vision.  Curbside consult suggested that I see the patient so as to minimize potential growth of scotoma right eye Last edited by Hurman Horn, MD on 02/16/2022  3:12 PM.      Referring physician: Luberta Mutter, MD [place],  [place] [address]  HISTORICAL INFORMATION:   Selected notes from the [facility]: No current outpatient medications on file. (Ophthalmic Drugs)   No current facility-administered medications for this visit. (Ophthalmic Drugs)   Current Outpatient Medications (Other)  Medication Sig   acetaminophen (TYLENOL) 500 MG tablet Take 500 mg by mouth daily as needed (pain).   ELIQUIS 5 MG TABS tablet Take 1 tablet (5 mg total) by mouth 2 (two) times daily.   Multiple Vitamins-Minerals (OCUVITE PRESERVISION PO) Take 1 tablet by mouth every morning.    No current facility-administered medications for this visit. (Other)      REVIEW OF SYSTEMS: ROS   Negative for: Constitutional, Gastrointestinal, Neurological, Skin, Genitourinary, Musculoskeletal, HENT, Endocrine, Cardiovascular, Eyes, Respiratory, Psychiatric, Allergic/Imm, Heme/Lymph Last edited by Hurman Horn, MD on 02/16/2022  2:48 PM.       ALLERGIES No Known Allergies  PAST MEDICAL HISTORY Past Medical History:  Diagnosis Date   Anxiety    Arthritis    "in the fingers" per pt   Atrial fibrillation ([place])    Bladder neck contracture    Dysrhythmia    A-FIB / HX BRADYCARDIA - FOLLOWED BY DR. [place] DUE TO [name]   Gross hematuria    History  of gout    History of prostate cancer    S/P RADIOACTIVE SEED IMPLANTS   History of squamous cell carcinoma of skin    EXCISION LOWER LIP AND RIGHT EAR   History of TIA (transient ischemic attack)    probable tia no residual  09-09-2012   Hyperlipidemia    Hypertension    Macular degeneration    both eyes   Urethral stricture    Urinary retention    Past Surgical History:  Procedure Laterality Date   APPENDECTOMY     CATARACT EXTRACTION W/ INTRAOCULAR LENS  IMPLANT, BILATERAL     CHOLECYSTECTOMY  1977   CYSTOSCOPY WITH URETHRAL DILATATION N/A 02/12/2013   Procedure: CYSTOSCOPY WITH BALLOON DILATATION OF URETHRAL STRICTURE AND BLADDER FULGERATION;  Surgeon: Bernestine Amass, MD;  [facility];  Service: Urology;  Laterality: N/A;   ESOPHAGOGASTRODUODENOSCOPY (EGD) WITH PROPOFOL N/A 11/02/2020   Procedure: ESOPHAGOGASTRODUODENOSCOPY (EGD) WITH PROPOFOL;  Surgeon: Clarene Essex, MD;  [facility];  Service: Endoscopy;  Laterality: N/A;   LAPAROSCOPIC APPENDECTOMY N/A 08/22/2013   Procedure: APPENDECTOMY LAPAROSCOPIC;  Surgeon: Pedro Earls, MD;  [facility];  Service: General;  Laterality: N/A;   [place] N/A 11/02/2020   Procedure: SAVORY DILATION;  Surgeon: Clarene Essex, MD;  [facility];  Service: Endoscopy;  Laterality: N/A;   TONSILLECTOMY  as child   TOTAL KNEE ARTHROPLASTY  Right 11-23-2004    FAMILY HISTORY Family History  Problem Relation Age of Onset   Cancer Mother    Cancer Father     SOCIAL HISTORY Social History   Tobacco Use   Smoking status: Former    Packs/day: 1.50    Years: 35.00    Total pack years: 52.50    Types: Cigarettes    Quit date: 01/30/1979    Years since quitting: 43.0   Smokeless tobacco: Never  Vaping Use   Vaping Use: Never used  Substance Use Topics   Alcohol use: Yes    Comment: "5 days per week I drink 1 glass of wine"   Drug use: No          OPHTHALMIC EXAM:  Base Eye Exam     Visual Acuity (ETDRS)       Right Left   Dist [place] 20/200 CF at 3'         Tonometry (Tonopen, 2:50 PM)       Right Left   Pressure 9 9         Pupils       Pupils APD   Right PERRL None   Left PERRL Trace         Visual Fields       Left Right   Restrictions Partial inner superior temporal, inferior temporal, superior nasal, inferior nasal deficiencies Partial inner superior temporal, inferior temporal, superior nasal, inferior nasal deficiencies         Extraocular Movement       Right Left    Full, Ortho Full, Ortho         Neuro/Psych     Oriented x3: Yes   Mood/Affect: Normal           Slit Lamp and Fundus Exam     External Exam       Right Left   External Normal Normal         Slit Lamp Exam       Right Left   Lids/Lashes Normal Normal   Conjunctiva/Sclera White and quiet White and quiet   Cornea Clear Clear   Anterior Chamber Deep and quiet Deep and quiet   Iris Round and reactive Round and reactive   Lens Centered posterior chamber intraocular lens Centered posterior chamber intraocular lens   Anterior Vitreous Normal Normal         Fundus Exam       Right Left   Posterior Vitreous Posterior vitreous detachment Posterior vitreous detachment   Disc Normal, with small peripapillary CNVM nasal aspect small rim of hemorrhage not on the macular side Normal   C/D Ratio 0.5 0.5   Macula Retinal pigment epithelial atrophy - geographic, subfoveal approximately 7 disc areas in size Disciform scar, no active margins to the large disciform scar encompassing the entire clinicians macula   Vessels Normal Normal   Periphery [place] degeneration Pavingstone degeneration, with peripheral chorioretinal scarring            IMAGING AND PROCEDURES  Imaging and Procedures for 02/16/22  Intravitreal Injection, Pharmacologic Agent - OD - Right Eye       Time Out 02/16/2022. 2:51 PM.  Confirmed correct patient, procedure, site, and patient consented.   Anesthesia Topical anesthesia was used.   Procedure Preparation included 5% betadine to ocular surface, 10% betadine to eyelids, Tobramycin 0.3%. A 30 gauge needle was used.   Injection: 1.25 mg Bevacizumab 1.'25mg'$ /0.81m   Route: Intravitreal, Site: Right Eye  NDC: 83151-761-60   Post-op Post injection exam found visual acuity of at least counting fingers. The patient tolerated the procedure well. There were no complications. The patient received written and verbal post procedure care education. Post injection medications included gentamicin.      Color Fundus Photography Optos - OU - Both Eyes       Right Eye Progression has no prior data. Disc findings include normal observations. Macula : geographic atrophy. Vessels : normal observations.   Left Eye Progression has no prior data. Disc findings include normal observations. Macula : geographic atrophy, retinal pigment epithelium abnormalities. Vessels : normal observations.   Notes Large macular hemorrhage subretinal along the superotemporal arcade superior to the geographic atrophy in the FAZ OD.  This particular sign of wet AMD poses enlargement of macular scotoma and vision loss.  Goal today is to protect 20/200's vision in the right eye  OS old disciform scar encompasses the entire clinicians macula.  No active edges OS observe             ASSESSMENT/PLAN:  Exudative age-related macular degeneration of right eye with active choroidal neovascularization (HCC) Active large region of subretinal hemorrhage.  Goal is to quiet the active CNVM so as to minimize chance of scotoma enlargement and maintain 20/200 vision in the right eye and prevent vision drop equivalent to the left eye with massive disciform scarring  We will restart Avastin OD today and reevaluate again in 3 months     ICD-10-CM   1. Exudative age-related macular degeneration of right  eye with inactive choroidal neovascularization (HCC)  H35.3212 Intravitreal Injection, Pharmacologic Agent - OD - Right Eye    Bevacizumab (AVASTIN) SOLN 1.25 mg    Color Fundus Photography Optos - OU - Both Eyes    CANCELED: OCT, Retina - OU - Both Eyes    2. Advanced nonexudative age-related macular degeneration of left eye with subfoveal involvement  H35.3124     3. Exudative age-related macular degeneration of right eye with active choroidal neovascularization ([place])  H35.3211       1.  After review of the photos and also discussion with the patient and family we will commence with intravitreal Avastin today and will plan follow-up visits and every 3 months simply to prevent scotoma enlargement in the right eye  2.  To notify us if any profound worsening of vision were to change  3.  Ophthalmic Meds Ordered this visit:  Meds ordered this encounter  Medications   Bevacizumab (AVASTIN) SOLN 1.25 mg       Return in about 3 months (around 05/19/2022) for dilate, OD, AVASTIN OCT.  There are no Patient Instructions on file for this visit.   Explained the diagnoses, plan, and follow up with the patient and they expressed understanding.  Patient expressed understanding of the importance of proper follow up care.   Clent Demark Mallorie Norrod M.D. Diseases & Surgery of the Retina and Vitreous [facility] [place] 02/16/22     Abbreviations: M myopia (nearsighted); A astigmatism; H hyperopia (farsighted); P presbyopia; Mrx spectacle prescription;  CTL contact lenses; OD right eye; OS left eye; OU both eyes  XT exotropia; ET esotropia; PEK punctate epithelial keratitis; PEE punctate epithelial erosions; DES dry eye syndrome; MGD meibomian gland dysfunction; ATs artificial tears; PFAT's preservative free artificial tears; [place] nuclear sclerotic cataract; PSC posterior subcapsular cataract; ERM epi-retinal membrane; PVD posterior vitreous detachment; RD retinal detachment; DM diabetes  mellitus; DR diabetic retinopathy; NPDR non-proliferative diabetic retinopathy;  PDR proliferative diabetic retinopathy; CSME clinically significant macular edema; DME diabetic macular edema; dbh dot blot hemorrhages; CWS cotton wool spot; POAG primary open angle glaucoma; C/D cup-to-disc ratio; HVF humphrey visual field; GVF goldmann visual field; OCT optical coherence tomography; IOP intraocular pressure; BRVO Branch retinal vein occlusion; CRVO central retinal vein occlusion; CRAO central retinal artery occlusion; BRAO branch retinal artery occlusion; RT retinal tear; SB scleral buckle; PPV pars plana vitrectomy; VH Vitreous hemorrhage; PRP panretinal laser photocoagulation; IVK intravitreal kenalog; VMT vitreomacular traction; MH Macular hole;  NVD neovascularization of the disc; NVE neovascularization elsewhere; AREDS age related eye disease study; ARMD age related macular degeneration; POAG primary open angle glaucoma; EBMD epithelial/anterior basement membrane dystrophy; ACIOL anterior chamber intraocular lens; IOL intraocular lens; PCIOL posterior chamber intraocular lens; Phaco/IOL phacoemulsification with intraocular lens placement; [place] photorefractive keratectomy; LASIK laser assisted in situ keratomileusis; HTN hypertension; DM diabetes mellitus; COPD chronic obstructive pulmonary disease

## 2022-02-17 ENCOUNTER — Encounter (INDEPENDENT_AMBULATORY_CARE_PROVIDER_SITE_OTHER): Payer: Self-pay | Admitting: Ophthalmology

## 2022-02-21 DIAGNOSIS — M6281 Muscle weakness (generalized): Secondary | ICD-10-CM | POA: Diagnosis not present

## 2022-02-21 DIAGNOSIS — R2681 Unsteadiness on feet: Secondary | ICD-10-CM | POA: Diagnosis not present

## 2022-02-21 DIAGNOSIS — M5459 Other low back pain: Secondary | ICD-10-CM | POA: Diagnosis not present

## 2022-02-21 DIAGNOSIS — R2689 Other abnormalities of gait and mobility: Secondary | ICD-10-CM | POA: Diagnosis not present

## 2022-02-23 DIAGNOSIS — M6281 Muscle weakness (generalized): Secondary | ICD-10-CM | POA: Diagnosis not present

## 2022-02-23 DIAGNOSIS — R2681 Unsteadiness on feet: Secondary | ICD-10-CM | POA: Diagnosis not present

## 2022-02-23 DIAGNOSIS — R2689 Other abnormalities of gait and mobility: Secondary | ICD-10-CM | POA: Diagnosis not present

## 2022-02-23 DIAGNOSIS — M5459 Other low back pain: Secondary | ICD-10-CM | POA: Diagnosis not present

## 2022-02-28 DIAGNOSIS — M6281 Muscle weakness (generalized): Secondary | ICD-10-CM | POA: Diagnosis not present

## 2022-02-28 DIAGNOSIS — M5459 Other low back pain: Secondary | ICD-10-CM | POA: Diagnosis not present

## 2022-02-28 DIAGNOSIS — R2681 Unsteadiness on feet: Secondary | ICD-10-CM | POA: Diagnosis not present

## 2022-02-28 DIAGNOSIS — R2689 Other abnormalities of gait and mobility: Secondary | ICD-10-CM | POA: Diagnosis not present

## 2022-03-02 DIAGNOSIS — M6281 Muscle weakness (generalized): Secondary | ICD-10-CM | POA: Diagnosis not present

## 2022-03-02 DIAGNOSIS — R2689 Other abnormalities of gait and mobility: Secondary | ICD-10-CM | POA: Diagnosis not present

## 2022-03-02 DIAGNOSIS — R2681 Unsteadiness on feet: Secondary | ICD-10-CM | POA: Diagnosis not present

## 2022-03-02 DIAGNOSIS — M5459 Other low back pain: Secondary | ICD-10-CM | POA: Diagnosis not present

## 2022-03-07 DIAGNOSIS — M5459 Other low back pain: Secondary | ICD-10-CM | POA: Diagnosis not present

## 2022-03-07 DIAGNOSIS — M6281 Muscle weakness (generalized): Secondary | ICD-10-CM | POA: Diagnosis not present

## 2022-03-07 DIAGNOSIS — R2681 Unsteadiness on feet: Secondary | ICD-10-CM | POA: Diagnosis not present

## 2022-03-07 DIAGNOSIS — R2689 Other abnormalities of gait and mobility: Secondary | ICD-10-CM | POA: Diagnosis not present

## 2022-03-09 ENCOUNTER — Ambulatory Visit
Admission: RE | Admit: 2022-03-09 | Discharge: 2022-03-09 | Disposition: A | Discharge status: Home / Self Care | Payer: Medicare Other | Source: Ambulatory Visit | Attending: Medical | Admitting: Medical

## 2022-03-09 ENCOUNTER — Other Ambulatory Visit: Payer: Self-pay | Admitting: Medical

## 2022-03-09 DIAGNOSIS — M5459 Other low back pain: Secondary | ICD-10-CM | POA: Diagnosis not present

## 2022-03-09 DIAGNOSIS — R2681 Unsteadiness on feet: Secondary | ICD-10-CM | POA: Diagnosis not present

## 2022-03-09 DIAGNOSIS — R52 Pain, unspecified: Secondary | ICD-10-CM

## 2022-03-09 DIAGNOSIS — M25551 Pain in right hip: Secondary | ICD-10-CM | POA: Diagnosis not present

## 2022-03-09 DIAGNOSIS — R2689 Other abnormalities of gait and mobility: Secondary | ICD-10-CM | POA: Diagnosis not present

## 2022-03-09 DIAGNOSIS — M6281 Muscle weakness (generalized): Secondary | ICD-10-CM | POA: Diagnosis not present

## 2022-03-09 DIAGNOSIS — R600 Localized edema: Secondary | ICD-10-CM | POA: Diagnosis not present

## 2022-03-12 ENCOUNTER — Emergency Department (HOSPITAL_COMMUNITY): Payer: Medicare Other

## 2022-03-12 ENCOUNTER — Other Ambulatory Visit: Payer: Self-pay

## 2022-03-12 ENCOUNTER — Inpatient Hospital Stay (HOSPITAL_COMMUNITY)
Admission: EM | Admit: 2022-03-12 | Discharge: 2022-03-14 | DRG: 605 | Disposition: A | Discharge status: Rehabilitation Facility | Payer: Medicare Other | Attending: Family Medicine | Admitting: Family Medicine

## 2022-03-12 DIAGNOSIS — E782 Mixed hyperlipidemia: Secondary | ICD-10-CM | POA: Diagnosis not present

## 2022-03-12 DIAGNOSIS — Y92009 Unspecified place in unspecified non-institutional (private) residence as the place of occurrence of the external cause: Secondary | ICD-10-CM | POA: Diagnosis not present

## 2022-03-12 DIAGNOSIS — R001 Bradycardia, unspecified: Secondary | ICD-10-CM | POA: Diagnosis not present

## 2022-03-12 DIAGNOSIS — S300XXA Contusion of lower back and pelvis, initial encounter: Secondary | ICD-10-CM | POA: Diagnosis not present

## 2022-03-12 DIAGNOSIS — Z8673 Personal history of transient ischemic attack (TIA), and cerebral infarction without residual deficits: Secondary | ICD-10-CM

## 2022-03-12 DIAGNOSIS — Z85828 Personal history of other malignant neoplasm of skin: Secondary | ICD-10-CM

## 2022-03-12 DIAGNOSIS — Z9841 Cataract extraction status, right eye: Secondary | ICD-10-CM

## 2022-03-12 DIAGNOSIS — M25551 Pain in right hip: Secondary | ICD-10-CM | POA: Diagnosis not present

## 2022-03-12 DIAGNOSIS — H548 Legal blindness, as defined in USA: Secondary | ICD-10-CM | POA: Diagnosis present

## 2022-03-12 DIAGNOSIS — D62 Acute posthemorrhagic anemia: Secondary | ICD-10-CM | POA: Diagnosis present

## 2022-03-12 DIAGNOSIS — I10 Essential (primary) hypertension: Secondary | ICD-10-CM | POA: Diagnosis present

## 2022-03-12 DIAGNOSIS — I4819 Other persistent atrial fibrillation: Secondary | ICD-10-CM | POA: Diagnosis present

## 2022-03-12 DIAGNOSIS — M1611 Unilateral primary osteoarthritis, right hip: Secondary | ICD-10-CM | POA: Diagnosis present

## 2022-03-12 DIAGNOSIS — R1310 Dysphagia, unspecified: Secondary | ICD-10-CM

## 2022-03-12 DIAGNOSIS — Z87891 Personal history of nicotine dependence: Secondary | ICD-10-CM | POA: Diagnosis not present

## 2022-03-12 DIAGNOSIS — F419 Anxiety disorder, unspecified: Secondary | ICD-10-CM | POA: Diagnosis present

## 2022-03-12 DIAGNOSIS — F05 Delirium due to known physiological condition: Secondary | ICD-10-CM | POA: Diagnosis not present

## 2022-03-12 DIAGNOSIS — Z7901 Long term (current) use of anticoagulants: Secondary | ICD-10-CM

## 2022-03-12 DIAGNOSIS — I959 Hypotension, unspecified: Secondary | ICD-10-CM | POA: Diagnosis present

## 2022-03-12 DIAGNOSIS — Z809 Family history of malignant neoplasm, unspecified: Secondary | ICD-10-CM

## 2022-03-12 DIAGNOSIS — D6859 Other primary thrombophilia: Secondary | ICD-10-CM | POA: Diagnosis present

## 2022-03-12 DIAGNOSIS — Z8546 Personal history of malignant neoplasm of prostate: Secondary | ICD-10-CM | POA: Diagnosis not present

## 2022-03-12 DIAGNOSIS — R1314 Dysphagia, pharyngoesophageal phase: Secondary | ICD-10-CM | POA: Diagnosis not present

## 2022-03-12 DIAGNOSIS — M7981 Nontraumatic hematoma of soft tissue: Secondary | ICD-10-CM | POA: Diagnosis not present

## 2022-03-12 DIAGNOSIS — I4891 Unspecified atrial fibrillation: Secondary | ICD-10-CM | POA: Diagnosis present

## 2022-03-12 DIAGNOSIS — Z96651 Presence of right artificial knee joint: Secondary | ICD-10-CM | POA: Diagnosis present

## 2022-03-12 DIAGNOSIS — W19XXXA Unspecified fall, initial encounter: Secondary | ICD-10-CM | POA: Diagnosis not present

## 2022-03-12 DIAGNOSIS — H3530 Unspecified macular degeneration: Secondary | ICD-10-CM | POA: Diagnosis present

## 2022-03-12 DIAGNOSIS — M109 Gout, unspecified: Secondary | ICD-10-CM | POA: Diagnosis present

## 2022-03-12 DIAGNOSIS — Z66 Do not resuscitate: Secondary | ICD-10-CM | POA: Diagnosis present

## 2022-03-12 DIAGNOSIS — T148XXA Other injury of unspecified body region, initial encounter: Secondary | ICD-10-CM | POA: Diagnosis not present

## 2022-03-12 DIAGNOSIS — Z961 Presence of intraocular lens: Secondary | ICD-10-CM | POA: Diagnosis present

## 2022-03-12 DIAGNOSIS — E785 Hyperlipidemia, unspecified: Secondary | ICD-10-CM | POA: Diagnosis present

## 2022-03-12 DIAGNOSIS — W010XXA Fall on same level from slipping, tripping and stumbling without subsequent striking against object, initial encounter: Secondary | ICD-10-CM | POA: Diagnosis present

## 2022-03-12 DIAGNOSIS — Z9842 Cataract extraction status, left eye: Secondary | ICD-10-CM

## 2022-03-12 DIAGNOSIS — S7001XA Contusion of right hip, initial encounter: Principal | ICD-10-CM

## 2022-03-12 DIAGNOSIS — K5900 Constipation, unspecified: Secondary | ICD-10-CM | POA: Diagnosis present

## 2022-03-12 DIAGNOSIS — Z79899 Other long term (current) drug therapy: Secondary | ICD-10-CM | POA: Diagnosis not present

## 2022-03-12 DIAGNOSIS — W19XXXD Unspecified fall, subsequent encounter: Secondary | ICD-10-CM | POA: Diagnosis present

## 2022-03-12 DIAGNOSIS — R1319 Other dysphagia: Secondary | ICD-10-CM | POA: Diagnosis not present

## 2022-03-12 DIAGNOSIS — S8011XD Contusion of right lower leg, subsequent encounter: Secondary | ICD-10-CM | POA: Diagnosis not present

## 2022-03-12 DIAGNOSIS — S300XXD Contusion of lower back and pelvis, subsequent encounter: Secondary | ICD-10-CM | POA: Diagnosis not present

## 2022-03-12 DIAGNOSIS — R5381 Other malaise: Secondary | ICD-10-CM | POA: Diagnosis present

## 2022-03-12 DIAGNOSIS — I739 Peripheral vascular disease, unspecified: Secondary | ICD-10-CM | POA: Diagnosis not present

## 2022-03-12 LAB — CBC WITH DIFFERENTIAL/PLATELET
Abs Immature Granulocytes: 0.05 10*3/uL (ref 0.00–0.07)
Basophils Absolute: 0.1 10*3/uL (ref 0.0–0.1)
Basophils Relative: 1 %
Eosinophils Absolute: 0 10*3/uL (ref 0.0–0.5)
Eosinophils Relative: 0 %
HCT: 34.4 % — ABNORMAL LOW (ref 39.0–52.0)
Hemoglobin: 11.1 g/dL — ABNORMAL LOW (ref 13.0–17.0)
Immature Granulocytes: 0 %
Lymphocytes Relative: 16 %
Lymphs Abs: 1.8 10*3/uL (ref 0.7–4.0)
MCH: 30.5 pg (ref 26.0–34.0)
MCHC: 32.3 g/dL (ref 30.0–36.0)
MCV: 94.5 fL (ref 80.0–100.0)
Monocytes Absolute: 0.8 10*3/uL (ref 0.1–1.0)
Monocytes Relative: 7 %
Neutro Abs: 8.8 10*3/uL — ABNORMAL HIGH (ref 1.7–7.7)
Neutrophils Relative %: 76 %
Platelets: 195 10*3/uL (ref 150–400)
RBC: 3.64 MIL/uL — ABNORMAL LOW (ref 4.22–5.81)
RDW: 13.5 % (ref 11.5–15.5)
WBC: 11.6 10*3/uL — ABNORMAL HIGH (ref 4.0–10.5)
nRBC: 0 % (ref 0.0–0.2)

## 2022-03-12 LAB — COMPREHENSIVE METABOLIC PANEL
ALT: 14 U/L (ref 0–44)
AST: 21 U/L (ref 15–41)
Albumin: 3.4 g/dL — ABNORMAL LOW (ref 3.5–5.0)
Alkaline Phosphatase: 40 U/L (ref 38–126)
Anion gap: 8 (ref 5–15)
BUN: 24 mg/dL — ABNORMAL HIGH (ref 8–23)
CO2: 23 mmol/L (ref 22–32)
Calcium: 8.9 mg/dL (ref 8.9–10.3)
Chloride: 107 mmol/L (ref 98–111)
Creatinine, Ser: 0.92 mg/dL (ref 0.61–1.24)
GFR, Estimated: >60 mL/min (ref >60)
Glucose, Bld: 131 mg/dL — ABNORMAL HIGH (ref 70–99)
Potassium: 3.9 mmol/L (ref 3.5–5.1)
Sodium: 138 mmol/L (ref 135–145)
Total Bilirubin: 1.4 mg/dL — ABNORMAL HIGH (ref 0.3–1.2)
Total Protein: 5.6 g/dL — ABNORMAL LOW (ref 6.5–8.1)

## 2022-03-12 LAB — CK: Total CK: 75 U/L (ref 49–397)

## 2022-03-12 LAB — MAGNESIUM: Magnesium: 1.8 mg/dL (ref 1.7–2.4)

## 2022-03-12 MED ORDER — METHOCARBAMOL 500 MG PO TABS
500.0000 mg | ORAL_TABLET | Freq: Once | ORAL | Status: AC
  Administered 2022-03-12: 500 mg via ORAL
  Filled 2022-03-12: qty 1

## 2022-03-12 MED ORDER — ACETAMINOPHEN 325 MG PO TABS: 650.0000 mg | ORAL_TABLET | Freq: Four times a day (QID) | ORAL | Status: DC | PRN

## 2022-03-12 MED ORDER — FENTANYL CITRATE PF 50 MCG/ML IJ SOSY
50.0000 ug | PREFILLED_SYRINGE | Freq: Once | INTRAMUSCULAR | Status: AC
  Administered 2022-03-12: 50 ug via INTRAVENOUS
  Filled 2022-03-12: qty 1

## 2022-03-12 MED ORDER — POLYETHYLENE GLYCOL 3350 17 G PO PACK
17.0000 g | PACK | Freq: Every day | ORAL | Status: DC
  Administered 2022-03-12 – 2022-03-14 (×3): 17 g via ORAL
  Filled 2022-03-12 (×3): qty 1

## 2022-03-12 MED ORDER — TRANEXAMIC ACID 650 MG PO TABS
1300.0000 mg | ORAL_TABLET | Freq: Two times a day (BID) | ORAL | Status: DC
  Administered 2022-03-12 – 2022-03-14 (×4): 1300 mg via ORAL
  Filled 2022-03-12 (×5): qty 2

## 2022-03-12 MED ORDER — ACETAMINOPHEN 650 MG RE SUPP: 650.0000 mg | Freq: Four times a day (QID) | RECTAL | Status: DC | PRN

## 2022-03-12 MED ORDER — DEXTROSE 5 % IV SOLN
500.0000 mg | Freq: Four times a day (QID) | INTRAVENOUS | Status: DC | PRN
Start: 2022-03-12 — End: 2022-03-13

## 2022-03-12 MED ORDER — SODIUM CHLORIDE 0.9% FLUSH: 3.0000 mL | INTRAVENOUS | Status: DC | PRN

## 2022-03-12 MED ORDER — SODIUM CHLORIDE 0.9 % IV SOLN: 250.0000 mL | INTRAVENOUS | Status: DC | PRN

## 2022-03-12 MED ORDER — IOHEXOL 300 MG/ML  SOLN
100.0000 mL | Freq: Once | INTRAMUSCULAR | Status: AC | PRN
  Administered 2022-03-12: 100 mL via INTRAVENOUS

## 2022-03-12 MED ORDER — OXYCODONE-ACETAMINOPHEN 5-325 MG PO TABS
1.0000 | ORAL_TABLET | Freq: Once | ORAL | Status: AC
  Administered 2022-03-12: 1 via ORAL
  Filled 2022-03-12: qty 1

## 2022-03-12 MED ORDER — OXYCODONE HCL 5 MG PO TABS
5.0000 mg | ORAL_TABLET | ORAL | Status: DC | PRN
  Administered 2022-03-12 – 2022-03-14 (×2): 5 mg via ORAL
  Filled 2022-03-12 (×2): qty 1

## 2022-03-12 MED ORDER — SODIUM CHLORIDE 0.9% FLUSH
3.0000 mL | Freq: Two times a day (BID) | INTRAVENOUS | Status: DC
Start: 2022-03-12 — End: 2022-03-14
  Administered 2022-03-12 – 2022-03-13 (×3): 3 mL via INTRAVENOUS

## 2022-03-12 MED ORDER — APIXABAN 5 MG PO TABS
5.0000 mg | ORAL_TABLET | Freq: Two times a day (BID) | ORAL | Status: DC
Start: 2022-03-12 — End: 2022-03-13
  Administered 2022-03-12: 5 mg via ORAL
  Filled 2022-03-12: qty 1

## 2022-03-12 MED ORDER — MORPHINE SULFATE (PF) 2 MG/ML IV SOLN
2.0000 mg | INTRAVENOUS | Status: DC | PRN
  Administered 2022-03-12: 2 mg via INTRAVENOUS
  Filled 2022-03-12: qty 1

## 2022-03-12 MED ORDER — LACTATED RINGERS IV BOLUS
500.0000 mL | Freq: Once | INTRAVENOUS | Status: AC
  Administered 2022-03-12: 500 mL via INTRAVENOUS

## 2022-03-12 NOTE — H&P (Addendum)
History and Physical    Patient: Justin Potter BTD:[id number] DOB: 10/13/1925 DOA: 03/12/2022 DOS: the patient was seen and examined on 03/12/2022 PCP: [name], MD  Patient coming from: ALF/ILF - [place]. He uses walker and cane to ambulate.    Chief Complaint: painful right hip   HPI: Justin Potter is a 88 y.o. male with medical history significant of atrial fibrillation, bradycardia, hx of prostate cancer, gout, hx of TIA, HTN, HLD, macular degeneration who presented to ED due to right hip pain after a fall 6 days ago. He got up from the dinner table and started to walk toward his walker. When he started to walk on the carpet his shoe gripped the rug and he tripped and fell onto his right hip. On 03/09/22 he went to [facility] and had a CT hip done which showed some swelling and no fracture. He was given no pain meds at that time and was doing well at that time. Yesterday afternoon he started to have pain. He called his son and said his pain was so severe at 2:45 this morning. He called him back early this morning and pain was 10/10 and described as sharp. No radiation.   He was unable to get out of bed and they called 911 to come to hospital.    Denies any fever/chills, vision changes/headaches, chest pain or palpitations, shortness of breath or cough, abdominal pain, N/V/D, dysuria or leg swelling.   He does not smoke or drink alcohol regularly.   ER Course:  vitals: afebrile, bp: 183/84, HR; 81, RR: 15, oxygen: 98%Ra Pertinent labs: wbc: 11.6, hgb: 11.1,  CT right hip: 1. Large intramuscular hematoma in the right gluteus maximus muscle measuring at least 11 x 5.6 x 11.4 cm. Soft tissue contusion overlying the right greater trochanter. 2.  No acute osseous injury of the right hip. 3. Moderate osteoarthritis of the right hip. In ED: cardiology consult, LR bolus of 500cc, robaxin and oxycodone given.    Review of Systems: As mentioned in the history of present illness.  All other systems reviewed and are negative. Past Medical History:  Diagnosis Date   Anxiety    Arthritis    "in the fingers" per pt   Atrial fibrillation ([place])    Bladder neck contracture    Dysrhythmia    A-FIB / HX BRADYCARDIA - FOLLOWED BY DR. [place] DUE TO [name]   Gross hematuria    History of gout    History of prostate cancer    S/P RADIOACTIVE SEED IMPLANTS   History of squamous cell carcinoma of skin    EXCISION LOWER LIP AND RIGHT EAR   History of TIA (transient ischemic attack)    probable tia no residual  09-09-2012   Hyperlipidemia    Hypertension    Macular degeneration    both eyes   Urethral stricture    Urinary retention    Past Surgical History:  Procedure Laterality Date   APPENDECTOMY     CATARACT EXTRACTION W/ INTRAOCULAR LENS  IMPLANT, BILATERAL     CHOLECYSTECTOMY  1977   CYSTOSCOPY WITH URETHRAL DILATATION N/A 02/12/2013   Procedure: CYSTOSCOPY WITH BALLOON DILATATION OF URETHRAL STRICTURE AND BLADDER FULGERATION;  Surgeon: Bernestine Amass, MD;  Location: [facility];  Service: Urology;  Laterality: N/A;   ESOPHAGOGASTRODUODENOSCOPY (EGD) WITH PROPOFOL N/A 11/02/2020   Procedure: ESOPHAGOGASTRODUODENOSCOPY (EGD) WITH PROPOFOL;  Surgeon: Clarene Essex, MD;  Location: [facility];  Service: Endoscopy;  Laterality: N/A;   LAPAROSCOPIC APPENDECTOMY N/A 08/22/2013   Procedure: APPENDECTOMY LAPAROSCOPIC;  Surgeon: Pedro Earls, MD;  Location: [facility];  Service: General;  Laterality: N/A;   [place] N/A 11/02/2020   Procedure: SAVORY DILATION;  Surgeon: Clarene Essex, MD;  Location: [facility];  Service: Endoscopy;  Laterality: N/A;   TONSILLECTOMY  as child   TOTAL KNEE ARTHROPLASTY Right 11-23-2004   Social History:  reports that he quit smoking about 43 years ago. His smoking use included cigarettes. He has a 52.50 pack-year smoking history. He has never used smokeless tobacco. He reports  current alcohol use. He reports that he does not use drugs.  No Known Allergies  Family History  Problem Relation Age of Onset   Cancer Mother    Cancer Father     Prior to Admission medications   Medication Sig Start Date End Date Taking? Authorizing Provider  acetaminophen (TYLENOL) 500 MG tablet Take 500 mg by mouth daily as needed (pain).    [provider]  ELIQUIS 5 MG TABS tablet Take 1 tablet (5 mg total) by mouth 2 (two) times daily. 11/21/21   Belva Crome, MD  Multiple Vitamins-Minerals (OCUVITE PRESERVISION PO) Take 1 tablet by mouth every morning.     [provider]    Physical Exam: Vitals:   03/12/22 1630 03/12/22 1644 03/12/22 1709 03/12/22 1712  BP: 131/65   (!) 164/99  Pulse: 67   (!) 59  Resp: (!) 21   (!) 21  Temp:  98.6 F (37 C)  98.4 F (36.9 C)  TempSrc:  Oral  Oral  SpO2: 98%   100%  Weight:   81.3 kg   Height:   '6\' 2"'$  (1.88 m)    General:  Appears calm and comfortable and is in NAD Eyes:  PERRL, EOMI, normal lids, iris ENT:  grossly normal hearing, lips & tongue, mmm; appropriate dentition Neck:  no LAD, masses or thyromegaly; no carotid bruits Cardiovascular:  rate regular, irregular rhythm, no m/r/g. No LE edema.  Respiratory:   CTA bilaterally with no wheezes/rales/rhonchi.  Normal respiratory effort. Abdomen:  soft, NT, ND, NABS Back:   normal alignment, no CVAT Skin:  no rash or induration seen on limited exam. Large ecchymosis over right hip/lateral femur  Musculoskeletal:  grossly normal tone BUE. TTP over right hip/lateral superior femur. Indurated over ecchymosis. Pain with movement.  Lower extremity:  No LE edema.  Limited foot exam with no ulcerations.  2+ distal pulses. Psychiatric:  grossly normal mood and affect, speech fluent and appropriate, AOx3 Neurologic:  CN 2-12 grossly intact, moves all extremities in coordinated fashion, sensation intact   Radiological Exams on Admission: Independently reviewed - see  discussion in A/P where applicable  CT HIP RIGHT W CONTRAST  Result Date: 03/12/2022 CLINICAL DATA:  Hip trauma, pain EXAM: CT OF THE LOWER RIGHT EXTREMITY WITH CONTRAST TECHNIQUE: Multidetector CT imaging of the lower right extremity was performed according to the standard protocol following intravenous contrast administration. RADIATION DOSE REDUCTION: This exam was performed according to the departmental dose-optimization program which includes automated exposure control, adjustment of the mA and/or kV according to patient size and/or use of iterative reconstruction technique. CONTRAST:  154m OMNIPAQUE IOHEXOL 300 MG/ML  SOLN COMPARISON:  12/07/2021 FINDINGS: Bones/Joint/Cartilage Generalized osteopenia. No acute fracture or dislocation. Normal alignment. Moderate osteoarthritis of the right hip. Mild osteoarthritis of the right SI joint. Severe bilateral facet arthropathy at L5-S1.  No joint effusion. Ligaments Ligaments are suboptimally evaluated by CT. Muscles and Tendons No muscle atrophy. Large intramuscular hematoma in the right gluteus maximus muscle measuring at least 11 x 5.6 x 11.4 cm. Soft tissue edema in the subcutaneous fat overlying the right greater trochanter likely reflecting a soft tissue contusion. Soft tissue No fluid collection or hematoma. No soft tissue mass. Peripheral vascular atherosclerotic disease. Prostatic radiation seeds noted. IMPRESSION: 1. Large intramuscular hematoma in the right gluteus maximus muscle measuring at least 11 x 5.6 x 11.4 cm. Soft tissue contusion overlying the right greater trochanter. 2.  No acute osseous injury of the right hip. 3. Moderate osteoarthritis of the right hip. Electronically Signed   By: Kathreen Devoid M.D.   On: 03/12/2022 11:41    EKG: Independently reviewed. Atrial fibrillation with rate 35 with junctional bradycardia; nonspecific ST changes with no evidence of acute ischemia [place].   Labs on Admission: I have personally reviewed the  available labs and imaging studies at the time of the admission.  Pertinent labs:    wbc: 11.6,  hgb: 11.1,   Assessment and Plan: Principal Problem:   Junctional bradycardia Active Problems:   Hematoma of right hip   Atrial fibrillation (HCC)   Hypertension   Hyperlipidemia   Dysphagia    Assessment and Plan: * Junctional bradycardia 88 year old male presenting to ED with complaints of right hip pain s/p fall 6 days ago who has history of bradycardia was witnessed to have episode in ED of heart rate down to 35 with hyoptension and associated symptoms.  -obs to telemetry -cardiology consulted, Dr. Harl Bowie -on no AV nodal blocking agents -history of bradycardia, followed by cardiology and has thus far been asymptomatic. Heart rate runs 45-70bpm -not associated with pain meds  Hematoma of right hip S/p fall 6 days ago on eliquis  Ct of right hip shows large intramuscular hematoma in right gluteus maximus measuring at least 11x5.6x11.4cm.  Continues to have significant pain and unable to bear weight/move much Discussed with ortho on call, Dr. Alvan Dame, they will follow Ice Pain meds Discussed further with ortho, okay with continuing eliquis since has hx of a CVA when held prior history. Will order TXA Bid x 5 days.  PT   Atrial fibrillation (HCC) History of bradycardia, on no AV nodal blocking agent or rate controlling drugs On eliquis '5mg'$  BID-hold for now with large hematoma    Hypertension Per cardiology note he has had low blood pressures  and  had slow ventricular response related to atrial fibrillation On on anti-HTN medication and blood pressures have been decently controlled  Has one episode of hypotension in ED when heart rate dropped Continue to monitor   Hyperlipidemia Not treated  Dysphagia S/p esophageal dilation  Can not swallow large pills     Advance Care Planning:   Code Status: DNR   Consults: cardiology: Dr Harl Bowie, ortho: Dr. Alvan Dame   DVT  Prophylaxis: SCDs   Family Communication: son at bedside: Dr. Arloa Koh  Severity of Illness: The appropriate patient status for this patient is OBSERVATION. Observation status is judged to be reasonable and necessary in order to provide the required intensity of service to ensure the patient's safety. The patient's presenting symptoms, physical exam findings, and initial radiographic and laboratory data in the context of their medical condition is felt to place them at decreased risk for further clinical deterioration. Furthermore, it is anticipated that the patient will be medically stable for discharge from the hospital  within 2 midnights of admission.   Author: Orma Flaming, MD 03/12/2022 5:46 PM  For on call review [email or web address].

## 2022-03-12 NOTE — ED Notes (Signed)
Holding meds for testing with RT

## 2022-03-12 NOTE — ED Notes (Signed)
This RN was notified of pt vitals upon exiting another pt room. Afib with HR in the 30s. This RN entered the room to the pt resting with eyes close. Hypotensive and bradycardiac. EKG obtained and MD notified, orders placed.

## 2022-03-12 NOTE — Consult Note (Signed)
Cardiology Consultation:   Patient ID: Justin Potter MRN: [id number]; DOB: [birth year]  Admit date: 03/12/2022 Date of Consult: 03/12/2022  PCP:  [name], MD   [facility] Providers Cardiologist:  [name], MD        Patient Profile:   Justin Potter is a 88 y.o. male with a hx of persistent afib, bradycardia  who is being seen 03/12/2022 for the evaluation of bradycardia at the request of Dr Doren Custard.  History of Present Illness:   Justin Potter 88 yo male history of persistent afib, chronic anticoag, bradycardia HTN but more recent issues with hypotension off home meds presents with leg pains after fall. Found to have large right gluteus maximus hemtaoma 11 x 5.6 x 11.4 cm after recent fall. While in ER episode of bradycardia to the 30s with low bp's, cardiology consulted  Patient reports no episodes of syncope or near syncope at home. Recent fall was mechanical, remembers slipping on carpet without any lightheadeness, presyncope or syncope.    K 3.9 BUN 24 Cr 0.92 WBC 11.6 Hgb 11.1 Plt 195  2021 monitor: afib, HRs 50s while awake. Slow rates and nocturnal pauses 3-4.5 seconds at night   From vitals review around 10AM HRs, bp's 80s/50s   Past Medical History:  Diagnosis Date   Anxiety    Arthritis    "in the fingers" per pt   Atrial fibrillation ([place])    Bladder neck contracture    Dysrhythmia    A-FIB / HX BRADYCARDIA - FOLLOWED BY DR. [place] DUE TO [name]   Gross hematuria    History of gout    History of prostate cancer    S/P RADIOACTIVE SEED IMPLANTS   History of squamous cell carcinoma of skin    EXCISION LOWER LIP AND RIGHT EAR   History of TIA (transient ischemic attack)    probable tia no residual  09-09-2012   Hyperlipidemia    Hypertension    Macular degeneration    both eyes   Urethral stricture    Urinary retention     Past Surgical History:  Procedure Laterality Date   APPENDECTOMY     CATARACT EXTRACTION  W/ INTRAOCULAR LENS  IMPLANT, BILATERAL     CHOLECYSTECTOMY  1977   CYSTOSCOPY WITH URETHRAL DILATATION N/A 02/12/2013   Procedure: CYSTOSCOPY WITH BALLOON DILATATION OF URETHRAL STRICTURE AND BLADDER FULGERATION;  Surgeon: Bernestine Amass, MD;  Location: [facility];  Service: Urology;  Laterality: N/A;   ESOPHAGOGASTRODUODENOSCOPY (EGD) WITH PROPOFOL N/A 11/02/2020   Procedure: ESOPHAGOGASTRODUODENOSCOPY (EGD) WITH PROPOFOL;  Surgeon: Clarene Essex, MD;  Location: [facility];  Service: Endoscopy;  Laterality: N/A;   LAPAROSCOPIC APPENDECTOMY N/A 08/22/2013   Procedure: APPENDECTOMY LAPAROSCOPIC;  Surgeon: Pedro Earls, MD;  Location: [facility];  Service: General;  Laterality: N/A;   [place] N/A 11/02/2020   Procedure: SAVORY DILATION;  Surgeon: Clarene Essex, MD;  Location: [facility];  Service: Endoscopy;  Laterality: N/A;   TONSILLECTOMY  as child   TOTAL KNEE ARTHROPLASTY Right 11-23-2004      Inpatient Medications: Scheduled Meds:  polyethylene glycol  17 g Oral Daily   sodium chloride flush  3 mL Intravenous Q12H   Continuous Infusions:  sodium chloride     methocarbamol (ROBAXIN) IV     PRN Meds: sodium chloride, acetaminophen **OR** acetaminophen, methocarbamol (ROBAXIN) IV, morphine injection, oxyCODONE, sodium chloride flush  Allergies:   No  Known Allergies  Social History:   Social History   Socioeconomic History   Marital status: Married    Spouse name: Not on file   Number of children: Not on file   Years of education: Not on file   Highest education level: Not on file  Occupational History   Not on file  Tobacco Use   Smoking status: Former    Packs/day: 1.50    Years: 35.00    Total pack years: 52.50    Types: Cigarettes    Quit date: 01/30/1979    Years since quitting: 43.1   Smokeless tobacco: Never  Vaping Use   Vaping Use: Never used  Substance and Sexual Activity   Alcohol use: Yes    Comment: "5 days  per week I drink 1 glass of wine"   Drug use: No   Sexual activity: Not Currently  Other Topics Concern   Not on file  Social History Narrative   Not on file   Social Determinants of Health   Financial Resource Strain: Not on file  Food Insecurity: No Food Insecurity (01/17/2021)   Hunger Vital Sign    Worried About Running Out of Food in the Last Year: Never true    Ran Out of Food in the Last Year: Never true  Transportation Needs: No Transportation Needs (01/17/2021)   PRAPARE - Hydrologist (Medical): No    Lack of Transportation (Non-Medical): No  Physical Activity: Not on file  Stress: Not on file  Social Connections: Not on file  Intimate Partner Violence: Not on file    Family History:    Family History  Problem Relation Age of Onset   Cancer Mother    Cancer Father      ROS:  Please see the history of present illness.   All other ROS reviewed and negative.     Physical Exam/Data:   Vitals:   03/12/22 1345 03/12/22 1415 03/12/22 1500 03/12/22 1504  BP: (!) 132/90 (!) 153/91 (!) 156/75   Pulse: 60 64 62   Resp: '17 17 18   '$ Temp:    98.4 F (36.9 C)  TempSrc:    Oral  SpO2: 100% 100% 99%    No intake or output data in the 24 hours ending 03/12/22 1522    08/10/2021    1:07 PM 11/03/2020    5:00 AM 11/02/2020    1:49 PM  Last 3 Weights  Weight (lbs) 175 lb 9.6 oz 167 lb 15.9 oz 180 lb  Weight (kg) 79.652 kg 76.2 kg 81.647 kg     There is no height or weight on file to calculate BMI.  General:  Well nourished, well developed, in no acute distress HEENT: normal Neck: no JVD Vascular: No carotid bruits; Distal pulses 2+ bilaterally Cardiac:  irreg Lungs:  clear to auscultation bilaterally, no wheezing, rhonchi or rales  Abd: soft, nontender, no hepatomegaly  Ext: no edema Musculoskeletal:  No deformities, BUE and BLE strength normal and equal Skin: warm and dry  Neuro:  CNs 2-12 intact, no focal abnormalities noted Psych:   Normal affect     Laboratory Data:  High Sensitivity Troponin:  No results for input(s): "TROPONINIHS" in the last 720 hours.   Chemistry Recent Labs  Lab 03/12/22 0843  NA 138  K 3.9  CL 107  CO2 23  GLUCOSE 131*  BUN 24*  CREATININE 0.92  CALCIUM 8.9  MG 1.8  GFRNONAA >60  ANIONGAP 8  Recent Labs  Lab 03/12/22 0843  PROT 5.6*  ALBUMIN 3.4*  AST 21  ALT 14  ALKPHOS 40  BILITOT 1.4*   Lipids No results for input(s): "CHOL", "TRIG", "HDL", "LABVLDL", "LDLCALC", "CHOLHDL" in the last 168 hours.  Hematology Recent Labs  Lab 03/12/22 0843  WBC 11.6*  RBC 3.64*  HGB 11.1*  HCT 34.4*  MCV 94.5  MCH 30.5  MCHC 32.3  RDW 13.5  PLT 195   Thyroid No results for input(s): "TSH", "FREET4" in the last 168 hours.  BNPNo results for input(s): "BNP", "PROBNP" in the last 168 hours.  DDimer No results for input(s): "DDIMER" in the last 168 hours.   Radiology/Studies:  CT HIP RIGHT W CONTRAST  Result Date: 03/12/2022 CLINICAL DATA:  Hip trauma, pain EXAM: CT OF THE LOWER RIGHT EXTREMITY WITH CONTRAST TECHNIQUE: Multidetector CT imaging of the lower right extremity was performed according to the standard protocol following intravenous contrast administration. RADIATION DOSE REDUCTION: This exam was performed according to the departmental dose-optimization program which includes automated exposure control, adjustment of the mA and/or kV according to patient size and/or use of iterative reconstruction technique. CONTRAST:  136m OMNIPAQUE IOHEXOL 300 MG/ML  SOLN COMPARISON:  12/07/2021 FINDINGS: Bones/Joint/Cartilage Generalized osteopenia. No acute fracture or dislocation. Normal alignment. Moderate osteoarthritis of the right hip. Mild osteoarthritis of the right SI joint. Severe bilateral facet arthropathy at L5-S1. No joint effusion. Ligaments Ligaments are suboptimally evaluated by CT. Muscles and Tendons No muscle atrophy. Large intramuscular hematoma in the right gluteus  maximus muscle measuring at least 11 x 5.6 x 11.4 cm. Soft tissue edema in the subcutaneous fat overlying the right greater trochanter likely reflecting a soft tissue contusion. Soft tissue No fluid collection or hematoma. No soft tissue mass. Peripheral vascular atherosclerotic disease. Prostatic radiation seeds noted. IMPRESSION: 1. Large intramuscular hematoma in the right gluteus maximus muscle measuring at least 11 x 5.6 x 11.4 cm. Soft tissue contusion overlying the right greater trochanter. 2.  No acute osseous injury of the right hip. 3. Moderate osteoarthritis of the right hip. Electronically Signed   By: HKathreen DevoidM.D.   On: 03/12/2022 11:41   CT HIP RIGHT WO CONTRAST  Result Date: 03/09/2022 CLINICAL DATA:  Right hip pain after fall 3 days ago EXAM: CT OF THE RIGHT HIP WITHOUT CONTRAST TECHNIQUE: Multidetector CT imaging of the right hip was performed according to the standard protocol. Multiplanar CT image reconstructions were also generated. RADIATION DOSE REDUCTION: This exam was performed according to the departmental dose-optimization program which includes automated exposure control, adjustment of the mA and/or kV according to patient size and/or use of iterative reconstruction technique. COMPARISON:  CT 11/25/2018, 08/20/2013 FINDINGS: Bones/Joint/Cartilage No acute fracture. No dislocation. No evidence of femoral head avascular necrosis. Mild-moderate osteoarthritis of the right hip with joint space narrowing and marginal osteophyte formation. No appreciable hip joint effusion. Included portion of the right hemipelvis appears intact without evidence of fracture or diastasis. 13 mm sclerotic lesion in the supra-acetabular right ilium is stable from 2015, benign. No suspicious lytic or sclerotic bone lesion. Ligaments Suboptimally assessed by CT. Muscles and Tendons No acute musculotendinous abnormality by CT. Soft tissues Soft tissue edema and some ill-defined hyperdense fluid within the  subcutaneous tissues overlying the posterolateral aspect of the right hip, likely posttraumatic. No well-defined hematoma. Brachytherapy seeds within the prostate bed. Colonic diverticulosis. Atherosclerotic vascular calcifications. No right inguinal lymphadenopathy. IMPRESSION: 1. No acute fracture or dislocation of the right hip. 2. Mild-moderate osteoarthritis  of the right hip. 3. Soft tissue edema and some ill-defined fluid within the subcutaneous tissues overlying the posterolateral aspect of the right hip, likely posttraumatic. No well-defined hematoma. Electronically Signed   By: Davina Poke D.O.   On: 03/09/2022 15:56     Assessment and Plan:   1.Bradycardia - long history of afib with slow VR, typicalls 50s in the past though more pronounced at night based on monitor. Off all av nodal agents - episode while in ER with severe hip pain from recent fall, around 10 AM afib down to 30s with bps 80s/50s. Low rates lasted several minutes, self resolved. He denies symptoms. Bolused IVFs, HRs self improved on there own without intervention.  - he denies any dizziness, presyncope, or syncope. Recent fall was clear that it was mechanical - I suspect episode was due to chronic low heart rates at times worsened by severe pain and vagal component. He reports 10/10 constant hip pain since being in the ER - monitor overnight on tele, will ask EP to see in AM.     2. Afib - self rate controlled, known slow rates at times  - with hip hematoma can hold eliquis initially on admission    For questions or updates, please contact [place] Please consult [email or web address] for contact info under    Signed, Carlyle Dolly, MD  03/12/2022 3:22 PM

## 2022-03-12 NOTE — Consult Note (Signed)
ORTHOPAEDIC CONSULTATION  REQUESTING PHYSICIAN: Orma Flaming, MD  PCP:  [name], MD  Chief Complaint: Right hip contusion, s/p fall a week ago  HPI: Justin Potter is a 88 y.o. male who presented to [facility] ED on 8/20 due to right hip pain after a fall 6 days ago. He was seen in clinic at [facility] on 8/17, and had a CT of his hip which showed no fracture. His pain became worse over the past few days, which prompted him to come to the ER.  Orthopaedics was consulted for evaluation and management.   Past Medical History:  Diagnosis Date   Anxiety    Arthritis    "in the fingers" per pt   Atrial fibrillation ([place])    Bladder neck contracture    Dysrhythmia    A-FIB / HX BRADYCARDIA - FOLLOWED BY DR. Weatherby DUE TO [name]   Gross hematuria    History of gout    History of prostate cancer    S/P RADIOACTIVE SEED IMPLANTS   History of squamous cell carcinoma of skin    EXCISION LOWER LIP AND RIGHT EAR   History of TIA (transient ischemic attack)    probable tia no residual  09-09-2012   Hyperlipidemia    Hypertension    Macular degeneration    both eyes   Urethral stricture    Urinary retention    Past Surgical History:  Procedure Laterality Date   APPENDECTOMY     CATARACT EXTRACTION W/ INTRAOCULAR LENS  IMPLANT, BILATERAL     CHOLECYSTECTOMY  1977   CYSTOSCOPY WITH URETHRAL DILATATION N/A 02/12/2013   Procedure: CYSTOSCOPY WITH BALLOON DILATATION OF URETHRAL STRICTURE AND BLADDER FULGERATION;  Surgeon: Bernestine Amass, MD;  Location: [facility];  Service: Urology;  Laterality: N/A;   ESOPHAGOGASTRODUODENOSCOPY (EGD) WITH PROPOFOL N/A 11/02/2020   Procedure: ESOPHAGOGASTRODUODENOSCOPY (EGD) WITH PROPOFOL;  Surgeon: Clarene Essex, MD;  Location: [facility];  Service: Endoscopy;  Laterality: N/A;   LAPAROSCOPIC APPENDECTOMY N/A 08/22/2013   Procedure: APPENDECTOMY LAPAROSCOPIC;  Surgeon: Pedro Earls, MD;  Location: [facility];  Service: General;   Laterality: N/A;   [place] N/A 11/02/2020   Procedure: SAVORY DILATION;  Surgeon: Clarene Essex, MD;  Location: [facility];  Service: Endoscopy;  Laterality: N/A;   TONSILLECTOMY  as child   TOTAL KNEE ARTHROPLASTY Right 11-23-2004   Social History   Socioeconomic History   Marital status: Married    Spouse name: Not on file   Number of children: Not on file   Years of education: Not on file   Highest education level: Not on file  Occupational History   Not on file  Tobacco Use   Smoking status: Former    Packs/day: 1.50    Years: 35.00    Total pack years: 52.50    Types: Cigarettes    Quit date: 01/30/1979    Years since quitting: 43.1   Smokeless tobacco: Never  Vaping Use   Vaping Use: Never used  Substance and Sexual Activity   Alcohol use: Yes    Comment: "5 days per week I drink 1 glass of wine"   Drug use: No   Sexual activity: Not Currently  Other Topics Concern   Not on file  Social History Narrative   Not on file   Social Determinants of Health   Financial Resource Strain: Not on file  Food Insecurity: No Food Insecurity (  01/17/2021)   Hunger Vital Sign    Worried About Running Out of Food in the Last Year: Never true    [place] in the Last Year: Never true  Transportation Needs: No Transportation Needs (01/17/2021)   PRAPARE - Hydrologist (Medical): No    Lack of Transportation (Non-Medical): No  Physical Activity: Not on file  Stress: Not on file  Social Connections: Not on file   Family History  Problem Relation Age of Onset   Cancer Mother    Cancer Father    No Known Allergies Prior to Admission medications   Medication Sig Start Date End Date Taking? Authorizing Provider  acetaminophen (TYLENOL) 500 MG tablet Take 1,000 mg by mouth in the morning and at bedtime.   Yes [provider]  ELIQUIS 5 MG TABS tablet Take 1 tablet (5 mg total) by mouth 2  (two) times daily. 11/21/21  Yes Belva Crome, MD  Multiple Vitamins-Minerals (OCUVITE PRESERVISION PO) Take 1 tablet by mouth every morning.    Yes [provider]  Pediatric Multiple Vit-C-FA (ANIMAL CHEWABLE MULTIVITAMIN PO) Take 1 tablet by mouth daily.   Yes [provider]   CT HIP RIGHT W CONTRAST  Result Date: 03/12/2022 CLINICAL DATA:  Hip trauma, pain EXAM: CT OF THE LOWER RIGHT EXTREMITY WITH CONTRAST TECHNIQUE: Multidetector CT imaging of the lower right extremity was performed according to the standard protocol following intravenous contrast administration. RADIATION DOSE REDUCTION: This exam was performed according to the departmental dose-optimization program which includes automated exposure control, adjustment of the mA and/or kV according to patient size and/or use of iterative reconstruction technique. CONTRAST:  159m OMNIPAQUE IOHEXOL 300 MG/ML  SOLN COMPARISON:  12/07/2021 FINDINGS: Bones/Joint/Cartilage Generalized osteopenia. No acute fracture or dislocation. Normal alignment. Moderate osteoarthritis of the right hip. Mild osteoarthritis of the right SI joint. Severe bilateral facet arthropathy at L5-S1. No joint effusion. Ligaments Ligaments are suboptimally evaluated by CT. Muscles and Tendons No muscle atrophy. Large intramuscular hematoma in the right gluteus maximus muscle measuring at least 11 x 5.6 x 11.4 cm. Soft tissue edema in the subcutaneous fat overlying the right greater trochanter likely reflecting a soft tissue contusion. Soft tissue No fluid collection or hematoma. No soft tissue mass. Peripheral vascular atherosclerotic disease. Prostatic radiation seeds noted. IMPRESSION: 1. Large intramuscular hematoma in the right gluteus maximus muscle measuring at least 11 x 5.6 x 11.4 cm. Soft tissue contusion overlying the right greater trochanter. 2.  No acute osseous injury of the right hip. 3. Moderate osteoarthritis of the right hip. Electronically Signed    By: HKathreen DevoidM.D.   On: 03/12/2022 11:41    Positive ROS: All other systems have been reviewed and were otherwise negative with the exception of those mentioned in the HPI and as above.  Physical Exam: General: Alert, no acute distress Skin: No lesions in the area of chief complaint Neurologic: Sensation intact distally Psychiatric: Patient is competent for consent with normal mood and affect   MUSCULOSKELETAL:   Right Hip: Ecchymosis about the right buttock. Generalized tenderness to palpation. Pain with movement of his RLE.   Assessment: Right hip/gluteal contusion  Plan:  Dr. OAlvan Dameevaluated the patient and discussed with him the proposed treatment plan. This can be managed non-operatively. He may restart his Eliquis now, as risk of cardiac complications are more concerning than his risk for slightly increased bleeding at the hip. We will put him on oral TXA  to minimize bleeding.   Follow up with Dr. Alvan Dame  in the office 3 weeks outpatient.     Irving Copas, PA-C Cell [phone]   03/12/2022 4:55 PM

## 2022-03-12 NOTE — Assessment & Plan Note (Addendum)
Now 7 days out from fall/bleed.  Eliquis resumed today by EP. - Continue Eliquis

## 2022-03-12 NOTE — ED Provider Notes (Signed)
[facility] EMERGENCY DEPARTMENT Provider Note   CSN: [id number] Arrival date & time: 03/12/22  0175     History  Chief Complaint  Patient presents with   Justin Potter is a 88 y.o. male.   Fall  Patient presents for right hip pain.  Medical history includes anxiety, arthritis, atrial fibrillation, gout, prostate cancer, TIA, HLD, HTN, thrombophilia.  He reports that he had a fall 6 days ago.  During the time of his fall, he landed on the ground striking the posterior aspect of his right hip.  He has had pain but has been able to ambulate since that time.  He states that he was evaluated twice and did undergo x-ray imaging.  Last night, his pain became more severe.  Pain is continued to worsen today.  Patient is on Eliquis and his last dose was last night.  Per chart review, patient was seen at [facility] 3 days ago.  He underwent x-ray imaging of the lumbar spine and right hip.  He underwent CT scan of right hip.  He was told that these images did not show injuries.     Home Medications Prior to Admission medications   Medication Sig Start Date End Date Taking? Authorizing Provider  acetaminophen (TYLENOL) 500 MG tablet Take 500 mg by mouth daily as needed (pain).    [provider]  ELIQUIS 5 MG TABS tablet Take 1 tablet (5 mg total) by mouth 2 (two) times daily. 11/21/21   Belva Crome, MD  Multiple Vitamins-Minerals (OCUVITE PRESERVISION PO) Take 1 tablet by mouth every morning.     [provider]      Allergies    Patient has no known allergies.    Review of Systems   Review of Systems  Musculoskeletal:  Positive for arthralgias and gait problem.  Hematological:  Bruises/bleeds easily (On Eliquis).  All other systems reviewed and are negative.   Physical Exam Updated Vital Signs BP 128/61   Pulse (!) 49   Temp 98.4 F (36.9 C) (Oral)   Resp (!) 29   SpO2 100%  Physical Exam Vitals and nursing note reviewed.   Constitutional:      General: He is not in acute distress.    Appearance: Normal appearance. He is well-developed. He is not ill-appearing, toxic-appearing or diaphoretic.  HENT:     Head: Normocephalic and atraumatic.     Right Ear: External ear normal.     Left Ear: External ear normal.     Nose: Nose normal.     Mouth/Throat:     Mouth: Mucous membranes are moist.     Pharynx: Oropharynx is clear.  Eyes:     Extraocular Movements: Extraocular movements intact.     Conjunctiva/sclera: Conjunctivae normal.  Cardiovascular:     Rate and Rhythm: Normal rate.     Heart sounds: No murmur heard. Pulmonary:     Effort: Pulmonary effort is normal. No respiratory distress.     Breath sounds: Normal breath sounds. No wheezing or rales.  Abdominal:     General: There is no distension.     Palpations: Abdomen is soft.     Tenderness: There is no abdominal tenderness.  Musculoskeletal:        General: Swelling and tenderness present.     Cervical back: Normal range of motion and neck supple.  Skin:    General: Skin is warm and dry.     Findings: Bruising present.  Neurological:     General: No focal deficit present.     Mental Status: He is alert and oriented to person, place, and time.     Cranial Nerves: No cranial nerve deficit.     Sensory: No sensory deficit.     Motor: No weakness.     Coordination: Coordination normal.  Psychiatric:        Mood and Affect: Mood normal.        Behavior: Behavior normal.        Thought Content: Thought content normal.        Judgment: Judgment normal.     ED Results / Procedures / Treatments   Labs (all labs ordered are listed, but only abnormal results are displayed) Labs Reviewed  COMPREHENSIVE METABOLIC PANEL - Abnormal; Notable for the following components:      Result Value   Glucose, Bld 131 (*)    BUN 24 (*)    Total Protein 5.6 (*)    Albumin 3.4 (*)    Total Bilirubin 1.4 (*)    All other components within normal limits   CBC WITH DIFFERENTIAL/PLATELET - Abnormal; Notable for the following components:   WBC 11.6 (*)    RBC 3.64 (*)    Hemoglobin 11.1 (*)    HCT 34.4 (*)    Neutro Abs 8.8 (*)    All other components within normal limits  CK  MAGNESIUM    EKG EKG Interpretation  Date/Time:  Sunday March 12 2022 10:08:53 EDT Ventricular Rate:  35 PR Interval:    QRS Duration: 100 QT Interval:  460 QTC Calculation: 351 R Axis:   -19 Text Interpretation: Atrial fibrillation Junctional bradycardia Ventricular premature complex Borderline left axis deviation Low voltage, extremity leads Confirmed by Godfrey Pick ([id number]) on 03/12/2022 11:57:46 AM  Radiology CT HIP RIGHT W CONTRAST  Result Date: 03/12/2022 CLINICAL DATA:  Hip trauma, pain EXAM: CT OF THE LOWER RIGHT EXTREMITY WITH CONTRAST TECHNIQUE: Multidetector CT imaging of the lower right extremity was performed according to the standard protocol following intravenous contrast administration. RADIATION DOSE REDUCTION: This exam was performed according to the departmental dose-optimization program which includes automated exposure control, adjustment of the mA and/or kV according to patient size and/or use of iterative reconstruction technique. CONTRAST:  162m OMNIPAQUE IOHEXOL 300 MG/ML  SOLN COMPARISON:  12/07/2021 FINDINGS: Bones/Joint/Cartilage Generalized osteopenia. No acute fracture or dislocation. Normal alignment. Moderate osteoarthritis of the right hip. Mild osteoarthritis of the right SI joint. Severe bilateral facet arthropathy at L5-S1. No joint effusion. Ligaments Ligaments are suboptimally evaluated by CT. Muscles and Tendons No muscle atrophy. Large intramuscular hematoma in the right gluteus maximus muscle measuring at least 11 x 5.6 x 11.4 cm. Soft tissue edema in the subcutaneous fat overlying the right greater trochanter likely reflecting a soft tissue contusion. Soft tissue No fluid collection or hematoma. No soft tissue mass. Peripheral  vascular atherosclerotic disease. Prostatic radiation seeds noted. IMPRESSION: 1. Large intramuscular hematoma in the right gluteus maximus muscle measuring at least 11 x 5.6 x 11.4 cm. Soft tissue contusion overlying the right greater trochanter. 2.  No acute osseous injury of the right hip. 3. Moderate osteoarthritis of the right hip. Electronically Signed   By: HKathreen DevoidM.D.   On: 03/12/2022 11:41    Procedures Procedures    Medications Ordered in ED Medications  oxyCODONE-acetaminophen (PERCOCET/ROXICET) 5-325 MG per tablet 1 tablet (1 tablet Oral Given 03/12/22 0824)  methocarbamol (ROBAXIN) tablet 500 mg (500 mg Oral Given  03/12/22 0932)  lactated ringers bolus 500 mL (500 mLs Intravenous New Bag/Given 03/12/22 1023)  iohexol (OMNIPAQUE) 300 MG/ML solution 100 mL (100 mLs Intravenous Contrast Given 03/12/22 1112)  fentaNYL (SUBLIMAZE) injection 50 mcg (50 mcg Intravenous Given 03/12/22 1241)    ED Course/ Medical Decision Making/ A&P                           Medical Decision Making Amount and/or Complexity of Data Reviewed Labs: ordered. Radiology: ordered.  Risk Prescription drug management. Decision regarding hospitalization.   This patient presents to the ED for concern of posterior right hip pain, this involves an extensive number of treatment options, and is a complaint that carries with it a high risk of complications and morbidity.  The differential diagnosis includes occult fracture, dislocation, hematoma, radiculopathy, abscess   Co morbidities that complicate the patient evaluation  anxiety, arthritis, atrial fibrillation, gout, prostate cancer, TIA, HLD, HTN, thrombophilia   Additional history obtained:  Additional history obtained from patient's son External records from outside source obtained and reviewed including EMR   Lab Tests:  I Ordered, and personally interpreted labs.  The pertinent results include: 3 g/dL drop in hemoglobin, when compared to  lab work from last year; mild leukocytosis; normal kidney function and normal electrolytes, normal CK   Imaging Studies ordered:  I ordered imaging studies including CT scan of right hip I independently visualized and interpreted imaging which showed large intramuscular hematoma of right gluteus maximus I agree with the radiologist interpretation   Cardiac Monitoring: / EKG:  The patient was maintained on a cardiac monitor.  I personally viewed and interpreted the cardiac monitored which showed an underlying rhythm of: Atrial fibrillation   Consultations Obtained:  I requested consultation with the cardiologist, Dr. Harl Bowie,  and discussed lab and imaging findings as well as pertinent plan - they recommend: Continued cardiac monitoring, cardiology will follow in consult   Problem List / ED Course / Critical interventions / Medication management  Patient is a functional 88 year old male presenting from his home at independent living, for worsened pain to the area of his posterior right hip.  He has been experiencing pain in this area since a fall that occurred 6 days ago.  This pain acutely worsened overnight.  Prior to the worsening, patient was treating his pain with Tylenol.  On arrival in the ED, patient endorses 10/10 severity pain.  On exam, there is a area of swelling and bruising to his right gluteus.  I reviewed imaging last week.  Patient did undergo a CT scan of right hip 3 days ago.  At that time, there was no well-defined hematoma.  There was some soft tissue edema and hyperdense fluid within the subcutaneous tissues overlying the posterior lateral aspect of right hip.  Patient is on Eliquis and is at risk for developing hematoma.  His last dose of Eliquis was last night.  Patient was given Percocet for analgesia.  Laboratory work-up was initiated.  On reassessment, patient endorsed continued pain.  Dose of Robaxin was given.  After Robaxin, patient did have relief of pain.  At this  time, he also had a episode of bradycardia and hypotension.  He appeared pale on exam.  He denied any symptoms of lightheadedness or nausea.  Bolus of IV fluids was ordered.  Patient's heart rate returned to its baseline which is typically in the 50s.  Low blood pressure resolved as well.  On lab work,  patient does have a 3 g/dL drop in his hemoglobin when compared to labs from last year.  Unfortunately, no more recent labs are available for comparison.  Patient underwent a CT scan of his right hip which did show the development of a large intramuscular hematoma.  Patient and son were informed of these results.  I spoke with patient about holding Eliquis.  Patient's son reports that in the past, patient held Eliquis for 3 days in preparation for procedure and subsequently had a CVA.  Plan will be for holding Eliquis for at least a day.  Patient had recurrence of severe pain and fentanyl was ordered.  Due to his episode of bradycardia with hypotension, I did speak with cardiologist on-call, Dr. Harl Bowie, who will follow the patient in consult.  Due to his ongoing pain, concern for hemoglobin decrease, and concern for intermittent symptomatic bradycardia, patient to be admitted.  He was admitted to hospitalist for further management. I ordered medication including Percocet, Robaxin, and fentanyl for analgesia; IV fluids for hypotension Reevaluation of the patient after these medicines showed that the patient improved I have reviewed the patients home medicines and have made adjustments as needed   Social Determinants of Health:  Has access to outpatient care, lives in independent living facility.        Final Clinical Impression(s) / ED Diagnoses Final diagnoses:  Intramuscular hematoma    Rx / DC Orders ED Discharge Orders     None         Godfrey Pick, MD 03/12/22 1303

## 2022-03-12 NOTE — Assessment & Plan Note (Addendum)
S/p fall 6 days ago on eliquis  Ct of right hip shows large intramuscular hematoma in right gluteus maximus measuring at least 11x5.6x11.4cm.  Continues to have significant pain and unable to bear weight/move much Discussed with ortho on call, Dr. Alvan Dame, they will follow Ice Pain meds Discussed further with ortho, okay with continuing eliquis since has hx of a CVA when held prior history. Will order TXA Bid x 5 days.  PT

## 2022-03-12 NOTE — ED Triage Notes (Signed)
Fall 3 days ago. Was checked out everything was fine. Increase pain since. Pain on right side

## 2022-03-12 NOTE — Assessment & Plan Note (Addendum)
[name] with outpatient monitoring in 2021 that showed "Continuous atrial fibrillation. While awake heart rates generally above 50 bpm. Slow ventricular response with nocturnal pauses greater than 3 seconds but less than 4.5 seconds on 60 occasions."  Here, he has had similar overall picture.  He did have some hypotension in the ER, in the setting of pain/vagal output and/or opiates, but none since, so EP hope to avoid pacing - EP consult - Continue telemetry

## 2022-03-12 NOTE — Assessment & Plan Note (Addendum)
S/p esophageal dilation  Can not swallow large pills

## 2022-03-12 NOTE — ED Notes (Signed)
ED TO INPATIENT HANDOFF REPORT  ED Nurse Name and Phone #: Leonia Reeves [phone]   S Name/Age/Gender Justin Potter 88 y.o. male Room/Bed: 017C/017C  Code Status   Code Status: DNR  Home/SNF/Other Nursing Home Patient oriented to: self, place, time, and situation Is this baseline? Yes   Triage Complete: Triage complete  Chief Complaint Junctional bradycardia [R00.1]  Triage Note Fall 3 days ago. Was checked out everything was fine. Increase pain since. Pain on right side   Allergies No Known Allergies  Level of Care/Admitting Diagnosis ED Disposition     ED Disposition  Admit   Condition  --   Comment  Hospital Area: [facility] [[id number]]  Level of Care: Telemetry Cardiac [[id number]]  May place patient in observation at [facility] or [facility] if equivalent level of care is available:: No  Covid Evaluation: Asymptomatic - no recent exposure (last 10 days) testing not required  Diagnosis: Junctional bradycardia [297989]  Admitting Physician: Orma Flaming [[id number]]  Attending Physician: Orma Flaming [[id number]]          B Medical/Surgery History Past Medical History:  Diagnosis Date   Anxiety    Arthritis    "in the fingers" per pt   Atrial fibrillation ([place])    Bladder neck contracture    Dysrhythmia    A-FIB / HX BRADYCARDIA - FOLLOWED BY DR. Louann DUE TO [name]   Gross hematuria    History of gout    History of prostate cancer    S/P RADIOACTIVE SEED IMPLANTS   History of squamous cell carcinoma of skin    EXCISION LOWER LIP AND RIGHT EAR   History of TIA (transient ischemic attack)    probable tia no residual  09-09-2012   Hyperlipidemia    Hypertension    Macular degeneration    both eyes   Urethral stricture    Urinary retention    Past Surgical History:  Procedure Laterality Date   APPENDECTOMY     CATARACT EXTRACTION W/ INTRAOCULAR LENS  IMPLANT, BILATERAL     CHOLECYSTECTOMY  1977   CYSTOSCOPY WITH  URETHRAL DILATATION N/A 02/12/2013   Procedure: CYSTOSCOPY WITH BALLOON DILATATION OF URETHRAL STRICTURE AND BLADDER FULGERATION;  Surgeon: Bernestine Amass, MD;  Location: [facility];  Service: Urology;  Laterality: N/A;   ESOPHAGOGASTRODUODENOSCOPY (EGD) WITH PROPOFOL N/A 11/02/2020   Procedure: ESOPHAGOGASTRODUODENOSCOPY (EGD) WITH PROPOFOL;  Surgeon: Clarene Essex, MD;  Location: [facility];  Service: Endoscopy;  Laterality: N/A;   LAPAROSCOPIC APPENDECTOMY N/A 08/22/2013   Procedure: APPENDECTOMY LAPAROSCOPIC;  Surgeon: Pedro Earls, MD;  Location: [facility];  Service: General;  Laterality: N/A;   [place] N/A 11/02/2020   Procedure: SAVORY DILATION;  Surgeon: Clarene Essex, MD;  Location: [facility];  Service: Endoscopy;  Laterality: N/A;   TONSILLECTOMY  as child   TOTAL KNEE ARTHROPLASTY Right 11-23-2004     A IV Location/Drains/Wounds Patient Lines/Drains/Airways Status     Active Line/Drains/Airways     Name Placement date Placement time Site Days   Peripheral IV 03/12/22 20 G Anterior;Distal;Right Forearm 03/12/22  1022  Forearm  less than 1            Intake/Output Last 24 hours No intake or output data in the 24 hours ending 03/12/22 1506  Labs/Imaging Results for orders placed or performed during the hospital encounter of 03/12/22 (from the past 48 hour(s))  Comprehensive metabolic panel  Status: Abnormal   Collection Time: 03/12/22  8:43 AM  Result Value Ref Range   Sodium 138 135 - 145 mmol/L   Potassium 3.9 3.5 - 5.1 mmol/L   Chloride 107 98 - 111 mmol/L   CO2 23 22 - 32 mmol/L   Glucose, Bld 131 (H) 70 - 99 mg/dL    Comment: Glucose reference range applies only to samples taken after fasting for at least 8 hours.   BUN 24 (H) 8 - 23 mg/dL   Creatinine, Ser 0.92 0.61 - 1.24 mg/dL   Calcium 8.9 8.9 - 10.3 mg/dL   Total Protein 5.6 (L) 6.5 - 8.1 g/dL   Albumin 3.4 (L) 3.5 - 5.0 g/dL   AST 21 15 - 41 U/L   ALT  14 0 - 44 U/L   Alkaline Phosphatase 40 38 - 126 U/L   Total Bilirubin 1.4 (H) 0.3 - 1.2 mg/dL   GFR, Estimated >60 >60 mL/min    Comment: (NOTE) Calculated using the CKD-EPI Creatinine Equation (2021)    Anion gap 8 5 - 15    Comment: Performed at [facility], [place] [address] [place], [place] [address]  CBC WITH DIFFERENTIAL     Status: Abnormal   Collection Time: 03/12/22  8:43 AM  Result Value Ref Range   WBC 11.6 (H) 4.0 - 10.5 K/uL   RBC 3.64 (L) 4.22 - 5.81 MIL/uL   Hemoglobin 11.1 (L) 13.0 - 17.0 g/dL   HCT 34.4 (L) 39.0 - 52.0 %   MCV 94.5 80.0 - 100.0 fL   MCH 30.5 26.0 - 34.0 pg   MCHC 32.3 30.0 - 36.0 g/dL   RDW 13.5 11.5 - 15.5 %   Platelets 195 150 - 400 K/uL   nRBC 0.0 0.0 - 0.2 %   Neutrophils Relative % 76 %   Neutro Abs 8.8 (H) 1.7 - 7.7 K/uL   Lymphocytes Relative 16 %   Lymphs Abs 1.8 0.7 - 4.0 K/uL   Monocytes Relative 7 %   Monocytes Absolute 0.8 0.1 - 1.0 K/uL   Eosinophils Relative 0 %   Eosinophils Absolute 0.0 0.0 - 0.5 K/uL   Basophils Relative 1 %   Basophils Absolute 0.1 0.0 - 0.1 K/uL   Immature Granulocytes 0 %   Abs Immature Granulocytes 0.05 0.00 - 0.07 K/uL    Comment: Performed at [facility], [address], [place], [place] [address]  CK     Status: None   Collection Time: 03/12/22  8:43 AM  Result Value Ref Range   Total CK 75 49 - 397 U/L    Comment: Performed at [facility], [place] [address] [place], [place] [address]  Magnesium     Status: None   Collection Time: 03/12/22  8:43 AM  Result Value Ref Range   Magnesium 1.8 1.7 - 2.4 mg/dL    Comment: Performed at [facility] [address], [place], [place] [address]   CT HIP RIGHT W CONTRAST  Result Date: 03/12/2022 CLINICAL DATA:  Hip trauma, pain EXAM: CT OF THE LOWER RIGHT EXTREMITY WITH CONTRAST TECHNIQUE: Multidetector CT imaging of the lower right extremity was performed according to the standard protocol following intravenous contrast  administration. RADIATION DOSE REDUCTION: This exam was performed according to the departmental dose-optimization program which includes automated exposure control, adjustment of the mA and/or kV according to patient size and/or use of iterative reconstruction technique. CONTRAST:  1110m OMNIPAQUE IOHEXOL 300 MG/ML  SOLN COMPARISON:  12/07/2021 FINDINGS: Bones/Joint/Cartilage Generalized osteopenia. No acute fracture or dislocation. Normal alignment. Moderate osteoarthritis of the right hip. Mild osteoarthritis of the right SI joint. Severe bilateral facet arthropathy at L5-S1. No joint effusion. Ligaments Ligaments are suboptimally evaluated by CT. Muscles and Tendons No muscle atrophy. Large intramuscular hematoma in the right gluteus maximus muscle measuring at least 11 x 5.6 x 11.4 cm. Soft tissue edema in the subcutaneous fat overlying the right greater trochanter likely reflecting a soft tissue contusion. Soft tissue No fluid collection or hematoma. No soft tissue mass. Peripheral vascular atherosclerotic disease. Prostatic radiation seeds noted. IMPRESSION: 1. Large intramuscular hematoma in the right gluteus maximus muscle measuring at least 11 x 5.6 x 11.4 cm. Soft tissue contusion overlying the right greater trochanter. 2.  No acute osseous injury of the right hip. 3. Moderate osteoarthritis of the right hip. Electronically Signed   By: Kathreen Devoid M.D.   On: 03/12/2022 11:41    Pending Labs Unresulted Labs (From admission, onward)     Start     Ordered   03/13/22 7322  Basic metabolic panel  Tomorrow morning,   R        03/12/22 1336   03/13/22 0500  CBC  Tomorrow morning,   R        03/12/22 1336   03/12/22 1335  Magnesium  Once,   R        03/12/22 1336            Vitals/Pain Today's Vitals   03/12/22 1130 03/12/22 1200 03/12/22 1211 03/12/22 1504  BP:      Pulse: 70 (!) 49    Resp: (!) 27 (!) 29    Temp:    98.4 F (36.9 C)  TempSrc:    Oral  SpO2: 97% 100%    PainSc:   9       Isolation Precautions No active isolations  Medications Medications  sodium chloride flush (NS) 0.9 % injection 3 mL (has no administration in time range)  sodium chloride flush (NS) 0.9 % injection 3 mL (has no administration in time range)  0.9 %  sodium chloride infusion (has no administration in time range)  acetaminophen (TYLENOL) tablet 650 mg (has no administration in time range)    Or  acetaminophen (TYLENOL) suppository 650 mg (has no administration in time range)  oxyCODONE (Oxy IR/ROXICODONE) immediate release tablet 5 mg (has no administration in time range)  morphine (PF) 2 MG/ML injection 2 mg (has no administration in time range)  methocarbamol (ROBAXIN) 500 mg in dextrose 5 % 50 mL IVPB (has no administration in time range)  polyethylene glycol (MIRALAX / GLYCOLAX) packet 17 g (has no administration in time range)  oxyCODONE-acetaminophen (PERCOCET/ROXICET) 5-325 MG per tablet 1 tablet (1 tablet Oral Given 03/12/22 0824)  methocarbamol (ROBAXIN) tablet 500 mg (500 mg Oral Given 03/12/22 0932)  lactated ringers bolus 500 mL (500 mLs Intravenous New Bag/Given 03/12/22 1023)  iohexol (OMNIPAQUE) 300 MG/ML solution 100 mL (100 mLs Intravenous Contrast Given 03/12/22 1112)  fentaNYL (SUBLIMAZE) injection 50 mcg (50 mcg Intravenous Given 03/12/22 1241)    Mobility walks with device High fall risk   Focused Assessments Cardiac Assessment Handoff:    Lab Results  Component Value Date   CKTOTAL 75 03/12/2022   No results found for: "DDIMER" Does the Patient currently have chest pain? No   , Neuro Assessment Handoff:  Swallow screen pass? Yes  Neuro Assessment:   Neuro Checks:      Last Documented NIHSS Modified Score:   Has TPA been given? No If patient is a Neuro Trauma and patient is going to OR before floor call report to [facility] nurse: [phone] or [phone]   R Recommendations: See Admitting Provider Note  Report given to:    Additional Notes:

## 2022-03-12 NOTE — Assessment & Plan Note (Signed)
Per cardiology note he has had low blood pressures  and  had slow ventricular response related to atrial fibrillation On on anti-HTN medication and blood pressures have been decently controlled  Has one episode of hypotension in ED when heart rate dropped Continue to monitor

## 2022-03-12 NOTE — Assessment & Plan Note (Signed)
Not treated

## 2022-03-13 DIAGNOSIS — W19XXXD Unspecified fall, subsequent encounter: Secondary | ICD-10-CM | POA: Diagnosis present

## 2022-03-13 DIAGNOSIS — Y92009 Unspecified place in unspecified non-institutional (private) residence as the place of occurrence of the external cause: Secondary | ICD-10-CM | POA: Diagnosis not present

## 2022-03-13 DIAGNOSIS — S7001XA Contusion of right hip, initial encounter: Secondary | ICD-10-CM

## 2022-03-13 DIAGNOSIS — Z9842 Cataract extraction status, left eye: Secondary | ICD-10-CM | POA: Diagnosis not present

## 2022-03-13 DIAGNOSIS — R1310 Dysphagia, unspecified: Secondary | ICD-10-CM

## 2022-03-13 DIAGNOSIS — Z9841 Cataract extraction status, right eye: Secondary | ICD-10-CM | POA: Diagnosis not present

## 2022-03-13 DIAGNOSIS — I10 Essential (primary) hypertension: Secondary | ICD-10-CM

## 2022-03-13 DIAGNOSIS — Z66 Do not resuscitate: Secondary | ICD-10-CM | POA: Diagnosis present

## 2022-03-13 DIAGNOSIS — E782 Mixed hyperlipidemia: Secondary | ICD-10-CM | POA: Diagnosis not present

## 2022-03-13 DIAGNOSIS — I4891 Unspecified atrial fibrillation: Secondary | ICD-10-CM | POA: Diagnosis present

## 2022-03-13 DIAGNOSIS — I4819 Other persistent atrial fibrillation: Secondary | ICD-10-CM

## 2022-03-13 DIAGNOSIS — H548 Legal blindness, as defined in USA: Secondary | ICD-10-CM | POA: Diagnosis present

## 2022-03-13 DIAGNOSIS — W010XXA Fall on same level from slipping, tripping and stumbling without subsequent striking against object, initial encounter: Secondary | ICD-10-CM | POA: Diagnosis present

## 2022-03-13 DIAGNOSIS — F419 Anxiety disorder, unspecified: Secondary | ICD-10-CM | POA: Diagnosis present

## 2022-03-13 DIAGNOSIS — M109 Gout, unspecified: Secondary | ICD-10-CM | POA: Diagnosis present

## 2022-03-13 DIAGNOSIS — T148XXA Other injury of unspecified body region, initial encounter: Secondary | ICD-10-CM | POA: Diagnosis not present

## 2022-03-13 DIAGNOSIS — Z87891 Personal history of nicotine dependence: Secondary | ICD-10-CM | POA: Diagnosis not present

## 2022-03-13 DIAGNOSIS — S8011XD Contusion of right lower leg, subsequent encounter: Secondary | ICD-10-CM | POA: Diagnosis not present

## 2022-03-13 DIAGNOSIS — Z961 Presence of intraocular lens: Secondary | ICD-10-CM | POA: Diagnosis present

## 2022-03-13 DIAGNOSIS — Z85828 Personal history of other malignant neoplasm of skin: Secondary | ICD-10-CM | POA: Diagnosis not present

## 2022-03-13 DIAGNOSIS — K5900 Constipation, unspecified: Secondary | ICD-10-CM | POA: Diagnosis present

## 2022-03-13 DIAGNOSIS — R5381 Other malaise: Secondary | ICD-10-CM | POA: Diagnosis present

## 2022-03-13 DIAGNOSIS — M25551 Pain in right hip: Secondary | ICD-10-CM | POA: Diagnosis not present

## 2022-03-13 DIAGNOSIS — Z7901 Long term (current) use of anticoagulants: Secondary | ICD-10-CM | POA: Diagnosis not present

## 2022-03-13 DIAGNOSIS — D6859 Other primary thrombophilia: Secondary | ICD-10-CM | POA: Diagnosis present

## 2022-03-13 DIAGNOSIS — M1611 Unilateral primary osteoarthritis, right hip: Secondary | ICD-10-CM | POA: Diagnosis present

## 2022-03-13 DIAGNOSIS — Z8546 Personal history of malignant neoplasm of prostate: Secondary | ICD-10-CM | POA: Diagnosis not present

## 2022-03-13 DIAGNOSIS — Z96651 Presence of right artificial knee joint: Secondary | ICD-10-CM | POA: Diagnosis present

## 2022-03-13 DIAGNOSIS — E785 Hyperlipidemia, unspecified: Secondary | ICD-10-CM | POA: Diagnosis present

## 2022-03-13 DIAGNOSIS — S300XXA Contusion of lower back and pelvis, initial encounter: Secondary | ICD-10-CM | POA: Diagnosis present

## 2022-03-13 DIAGNOSIS — R1314 Dysphagia, pharyngoesophageal phase: Secondary | ICD-10-CM | POA: Diagnosis not present

## 2022-03-13 DIAGNOSIS — Z79899 Other long term (current) drug therapy: Secondary | ICD-10-CM | POA: Diagnosis not present

## 2022-03-13 DIAGNOSIS — I959 Hypotension, unspecified: Secondary | ICD-10-CM | POA: Diagnosis present

## 2022-03-13 DIAGNOSIS — F05 Delirium due to known physiological condition: Secondary | ICD-10-CM | POA: Diagnosis not present

## 2022-03-13 DIAGNOSIS — R001 Bradycardia, unspecified: Secondary | ICD-10-CM | POA: Diagnosis present

## 2022-03-13 DIAGNOSIS — H3530 Unspecified macular degeneration: Secondary | ICD-10-CM | POA: Diagnosis present

## 2022-03-13 DIAGNOSIS — Z8673 Personal history of transient ischemic attack (TIA), and cerebral infarction without residual deficits: Secondary | ICD-10-CM | POA: Diagnosis not present

## 2022-03-13 DIAGNOSIS — S300XXD Contusion of lower back and pelvis, subsequent encounter: Secondary | ICD-10-CM | POA: Diagnosis not present

## 2022-03-13 DIAGNOSIS — D62 Acute posthemorrhagic anemia: Secondary | ICD-10-CM | POA: Diagnosis present

## 2022-03-13 DIAGNOSIS — R1319 Other dysphagia: Secondary | ICD-10-CM | POA: Diagnosis not present

## 2022-03-13 LAB — CBC
HCT: 29.7 % — ABNORMAL LOW (ref 39.0–52.0)
Hemoglobin: 9.5 g/dL — ABNORMAL LOW (ref 13.0–17.0)
MCH: 30.4 pg (ref 26.0–34.0)
MCHC: 32 g/dL (ref 30.0–36.0)
MCV: 95.2 fL (ref 80.0–100.0)
Platelets: 199 10*3/uL (ref 150–400)
RBC: 3.12 MIL/uL — ABNORMAL LOW (ref 4.22–5.81)
RDW: 13.8 % (ref 11.5–15.5)
WBC: 10.5 10*3/uL (ref 4.0–10.5)
nRBC: 0 % (ref 0.0–0.2)

## 2022-03-13 LAB — BASIC METABOLIC PANEL
Anion gap: 8 (ref 5–15)
BUN: 21 mg/dL (ref 8–23)
CO2: 27 mmol/L (ref 22–32)
Calcium: 8.8 mg/dL — ABNORMAL LOW (ref 8.9–10.3)
Chloride: 104 mmol/L (ref 98–111)
Creatinine, Ser: 0.95 mg/dL (ref 0.61–1.24)
GFR, Estimated: >60 mL/min (ref >60)
Glucose, Bld: 132 mg/dL — ABNORMAL HIGH (ref 70–99)
Potassium: 4 mmol/L (ref 3.5–5.1)
Sodium: 139 mmol/L (ref 135–145)

## 2022-03-13 MED ORDER — BISACODYL 5 MG PO TBEC
10.0000 mg | DELAYED_RELEASE_TABLET | Freq: Once | ORAL | Status: AC
  Administered 2022-03-13: 10 mg via ORAL
  Filled 2022-03-13: qty 2

## 2022-03-13 MED ORDER — APIXABAN 5 MG PO TABS
5.0000 mg | ORAL_TABLET | Freq: Two times a day (BID) | ORAL | Status: DC
  Administered 2022-03-13 – 2022-03-14 (×3): 5 mg via ORAL
  Filled 2022-03-13 (×3): qty 1

## 2022-03-13 NOTE — Hospital Course (Signed)
Justin Potter is a 88 y.o. M with prosCA, pers AF on ELiquis, hx bradycardia, hx TIA, HTN, HLD who presented with with increasing pain after a fall 6 days ago.  In the ER, incidentally noted to be hypotensive and bradycardic in the 30s.  Cardiology were consulted who recommended EP evaluation.

## 2022-03-13 NOTE — Evaluation (Signed)
Occupational Therapy Evaluation Patient Details Name: Justin Potter MRN: [id number] DOB: 1926/02/09 Today's Date: 03/13/2022   History of Present Illness 88 yo male who presented on 03/12/22 with R hip pain from a recent fall at home with CT showing large hematoma of the gluteus maximus, no fx. In ED pt also with bradycardia and hypotension. PMH: HTN and hypotension, Afib, blindness, gout, prostate cancer, TIA, HLD   Clinical Impression   PTA, pt was independent and living at [facility]; using a rollator for mobility. Pt currently requiring Min A for UB ADLs, Mod-Max A for LB ADLs, and Min A for functional transfers (from elevated surface). Pt with significant pain at R hip; however, despite pain, pt very motivated to participate in therapy. Pt would benefit from further acute OT to facilitate safe dc. Recommend dc to AIR for further OT to optimize safety, independence with ADLs, and return to PLOF.       Recommendations for follow up therapy are one component of a multi-disciplinary discharge planning process, led by the attending physician.  Recommendations may be updated based on patient status, additional functional criteria and insurance authorization.   Follow Up Recommendations  Acute inpatient rehab (3hours/day)    Assistance Recommended at Discharge Frequent or constant Supervision/Assistance  Patient can return home with the following A lot of help with walking and/or transfers;A little help with bathing/dressing/bathroom;A lot of help with bathing/dressing/bathroom    Functional Status Assessment  Patient has had a recent decline in their functional status and demonstrates the ability to make significant improvements in function in a reasonable and predictable amount of time.  Equipment Recommendations  Other (comment) (Defer to next venue)    Recommendations for Other Services PT consult     Precautions / Restrictions Precautions Precautions:  Fall Restrictions Weight Bearing Restrictions: Yes RLE Weight Bearing: Weight bearing as tolerated Other Position/Activity Restrictions: blindness      Mobility Bed Mobility Overal bed mobility: Needs Assistance Bed Mobility: Supine to Sit, Sit to Supine     Supine to sit: Max assist Sit to supine: Mod assist   General bed mobility comments: Max A to manage RLE and elevate trunk with patient holding therapist's hand. Pt then requiring Mod A for managing RLE in return to supine; pt managing trunk.    Transfers Overall transfer level: Needs assistance Equipment used: Rolling walker (2 wheels) Transfers: Sit to/from Stand Sit to Stand: Min assist, From elevated surface           General transfer comment: Min A for gaining balance. Cues throughout for weight shift forward and hand placement. Min A for safe descent. Educating pt on stepping R foot forward before descent to reduce pain      Balance Overall balance assessment: Needs assistance, History of Falls Sitting-balance support: Feet supported Sitting balance-Leahy Scale: Fair     Standing balance support: Reliant on assistive device for balance, Bilateral upper extremity supported, During functional activity Standing balance-Leahy Scale: Poor Standing balance comment: Reliant on UE support                           ADL either performed or assessed with clinical judgement   ADL Overall ADL's : Needs assistance/impaired Eating/Feeding: Set up;Sitting Eating/Feeding Details (indicate cue type and reason): Pt eating and drinking while sitting at EOB. Requiring assistance to open containers due to visual deficits Grooming: Set up;Sitting   Upper Body Bathing: Supervision/ safety;Set up;Sitting   Lower  Body Bathing: Minimal assistance;Sit to/from stand   Upper Body Dressing : Supervision/safety;Set up;Sitting   Lower Body Dressing: Maximal assistance;Sit to/from stand Lower Body Dressing Details  (indicate cue type and reason): Max A to adjust socks Toilet Transfer: Minimal assistance;Rolling walker (2 wheels) (sit<>stand at EOB and small side steps towards [facility])           Functional mobility during ADLs: Minimal assistance;Rolling walker (2 wheels) General ADL Comments: Pt performing self feeding while sitting at EOB and then sit<>stand and side steps. Despite pain, very motivated.     Vision Baseline Vision/History: 1 Wears glasses;2 Legally blind. Able to see shapes and some colors. Worse with distance vision.       Perception     Praxis      Pertinent Vitals/Pain Pain Assessment Pain Assessment: 0-10 Pain Score: 10-Worst pain ever Pain Location: R glut Pain Descriptors / Indicators: Sore Pain Intervention(s): Monitored during session, Limited activity within patient's tolerance, Repositioned     Hand Dominance Right   Extremity/Trunk Assessment Upper Extremity Assessment Upper Extremity Assessment: Generalized weakness   Lower Extremity Assessment Lower Extremity Assessment: RLE deficits/detail RLE Deficits / Details: hip flex 2-/5, knee ext 2+/5, ankle WFL though swelling noted RLE Sensation: WNL RLE Coordination: decreased gross motor   Cervical / Trunk Assessment Cervical / Trunk Assessment: Normal   Communication Communication Communication: No difficulties   Cognition Arousal/Alertness: Awake/alert Behavior During Therapy: WFL for tasks assessed/performed Overall Cognitive Status: Within Functional Limits for tasks assessed                                       General Comments  HR dropping to 58 at EOB; returning to high 70s    Exercises     Shoulder Instructions      Home Living Family/patient expects to be discharged to:: Private residence Living Arrangements: Alone Available Help at Discharge: Family;Available PRN/intermittently Type of Home: Apartment Home Access: Level entry     Home Layout: One level      Bathroom Shower/Tub: Occupational psychologist: Handicapped height     Home Equipment: Rollator (4 wheels);Cane - single point;Shower seat (adjustable bed)   Additional Comments: [facility] independent [facility], has life alert button as well as pull cords in rooms. Participates in daily exercise classes there      Prior Functioning/Environment Prior Level of Function : Needs assist             Mobility Comments: ambulates independently with rollator, has been working with PT ADLs Comments: independent before this injury. Staff assisting with IADLs and son managing medications        OT Problem List: Decreased strength;Decreased range of motion;Decreased activity tolerance;Impaired balance (sitting and/or standing);Decreased safety awareness;Decreased knowledge of use of DME or AE;Decreased knowledge of precautions;Pain      OT Treatment/Interventions: Self-care/ADL training;Therapeutic exercise;Energy conservation;DME and/or AE instruction;Therapeutic activities;Patient/family education    OT Goals(Current goals can be found in the care plan section) Acute Rehab OT Goals Patient Stated Goal: Reduce pain and get stronger OT Goal Formulation: With patient Time For Goal Achievement: 03/27/22 Potential to Achieve Goals: Good  OT Frequency: Min 2X/week    Co-evaluation              AM-PAC OT "6 Clicks" Daily Activity     Outcome Measure Help from another person eating meals?: A Little Help from another person taking  care of personal grooming?: A Little Help from another person toileting, which includes using toliet, bedpan, or urinal?: A Lot Help from another person bathing (including washing, rinsing, drying)?: A Lot Help from another person to put on and taking off regular upper body clothing?: A Little Help from another person to put on and taking off regular lower body clothing?: A Lot 6 Click Score: 15   End of Session Equipment Utilized During Treatment:  Rolling walker (2 wheels);Gait belt Nurse Communication: Mobility status  Activity Tolerance: Patient tolerated treatment well Patient left: in bed;with call bell/phone within reach;with nursing/sitter in room  OT Visit Diagnosis: Unsteadiness on feet (R26.81);Other abnormalities of gait and mobility (R26.89);Muscle weakness (generalized) (M62.81);Pain Pain - Right/Left: Right Pain - part of body: Hip                Time: 7737-3668 OT Time Calculation (min): 20 min Charges:  OT General Charges $OT Visit: 1 Visit OT Evaluation $OT Eval Moderate Complexity: 1 Mod  Keziah Drotar MSOT, OTR/L Acute Rehab Office: [place] 03/13/2022, 12:56 PM

## 2022-03-13 NOTE — Consult Note (Signed)
ELECTROPHYSIOLOGY CONSULT NOTE    Patient ID: Justin Potter MRN: [id number], DOB/AGE: 1925-10-09 88 y.o.  Admit date: 03/12/2022 Date of Consult: 03/13/2022  Primary Physician: [name], MD Primary Cardiologist: [name], MD  Electrophysiologist: New  Referring Provider: Dr. Harl Bowie  Patient Profile: Justin Potter is a 88 y.o. male with a history of persistent AF, slow VR, HTN -> hypotension, and legal blindness who is being seen today for the evaluation of bradycardia at the request of Dr. Harl Bowie.  HPI:  Justin Potter is a 88 y.o. male with medical history as above.   Pt had recent mechanical fall, in which he remembers slipping and falling on the carpet, without any prodrome, lightheadednessd, dizziness, syncope, or presyncope. Was seen as outpatient 03/09/22 at [facility] and had CT hip which showed swelling and no fracture.    He presented to the ED 8/20 with severe pain in his hop that woke him up ~0245. He was unable to get up or weight bear so came to [facility] via EMS.  He was given pain meds and robaxin and shortly after had an episode of bradycardia and hypotension. He felt poorly during this episode, denies prior similar symptoms.   Monitor has showed AF with rates 40-60s, occasional 3-4 sec nocturnal pause.   This am, he is in NAD lying in bed, pending lab draw.  He repeats history above. He denies past or recent symptoms consistent with bradycardia, and would prefer to avoid pacemaker if possible. No edema or chest pain.   Past Medical History:  Diagnosis Date   Anxiety    Arthritis    "in the fingers" per pt   Atrial fibrillation ([place])    Bladder neck contracture    Dysrhythmia    A-FIB / HX BRADYCARDIA - FOLLOWED BY DR. Emmet DUE TO [name]   Gross hematuria    History of gout    History of prostate cancer    S/P RADIOACTIVE SEED IMPLANTS   History of squamous cell carcinoma of skin    EXCISION LOWER LIP AND RIGHT  EAR   History of TIA (transient ischemic attack)    probable tia no residual  09-09-2012   Hyperlipidemia    Hypertension    Macular degeneration    both eyes   Urethral stricture    Urinary retention      Surgical History:  Past Surgical History:  Procedure Laterality Date   APPENDECTOMY     CATARACT EXTRACTION W/ INTRAOCULAR LENS  IMPLANT, BILATERAL     CHOLECYSTECTOMY  1977   CYSTOSCOPY WITH URETHRAL DILATATION N/A 02/12/2013   Procedure: CYSTOSCOPY WITH BALLOON DILATATION OF URETHRAL STRICTURE AND BLADDER FULGERATION;  Surgeon: Bernestine Amass, MD;  Location: [facility];  Service: Urology;  Laterality: N/A;   ESOPHAGOGASTRODUODENOSCOPY (EGD) WITH PROPOFOL N/A 11/02/2020   Procedure: ESOPHAGOGASTRODUODENOSCOPY (EGD) WITH PROPOFOL;  Surgeon: Clarene Essex, MD;  Location: [facility];  Service: Endoscopy;  Laterality: N/A;   LAPAROSCOPIC APPENDECTOMY N/A 08/22/2013   Procedure: APPENDECTOMY LAPAROSCOPIC;  Surgeon: Pedro Earls, MD;  Location: [facility];  Service: General;  Laterality: N/A;   [place] N/A 11/02/2020   Procedure: SAVORY DILATION;  Surgeon: Clarene Essex, MD;  Location: [facility];  Service: Endoscopy;  Laterality: N/A;   TONSILLECTOMY  as child   TOTAL KNEE ARTHROPLASTY Right 11-23-2004     Medications Prior to Admission  Medication Sig Dispense Refill  Last Dose   acetaminophen (TYLENOL) 500 MG tablet Take 1,000 mg by mouth in the morning and at bedtime.   03/11/2022   ELIQUIS 5 MG TABS tablet Take 1 tablet (5 mg total) by mouth 2 (two) times daily. 180 tablet 2 03/11/2022   Multiple Vitamins-Minerals (OCUVITE PRESERVISION PO) Take 1 tablet by mouth every morning.    03/11/2022   Pediatric Multiple Vit-C-FA (ANIMAL CHEWABLE MULTIVITAMIN PO) Take 1 tablet by mouth daily.   03/11/2022    Inpatient Medications:   polyethylene glycol  17 g Oral Daily   sodium chloride flush  3 mL Intravenous Q12H   tranexamic acid  1,300 mg  Oral BID    Allergies: No Known Allergies  Social History   Socioeconomic History   Marital status: Married    Spouse name: Not on file   Number of children: Not on file   Years of education: Not on file   Highest education level: Not on file  Occupational History   Not on file  Tobacco Use   Smoking status: Former    Packs/day: 1.50    Years: 35.00    Total pack years: 52.50    Types: Cigarettes    Quit date: 01/30/1979    Years since quitting: 43.1   Smokeless tobacco: Never  Vaping Use   Vaping Use: Never used  Substance and Sexual Activity   Alcohol use: Yes    Comment: "5 days per week I drink 1 glass of wine"   Drug use: No   Sexual activity: Not Currently  Other Topics Concern   Not on file  Social History Narrative   Not on file   Social Determinants of Health   Financial Resource Strain: Not on file  Food Insecurity: No Food Insecurity (01/17/2021)   Hunger Vital Sign    Worried About Running Out of Food in the Last Year: Never true    Ran Out of Food in the Last Year: Never true  Transportation Needs: No Transportation Needs (01/17/2021)   PRAPARE - Hydrologist (Medical): No    Lack of Transportation (Non-Medical): No  Physical Activity: Not on file  Stress: Not on file  Social Connections: Not on file  Intimate Partner Violence: Not on file     Family History  Problem Relation Age of Onset   Cancer Mother    Cancer Father      Review of Systems: All other systems reviewed and are otherwise negative except as noted above.  Physical Exam: Vitals:   03/12/22 1924 03/13/22 0011 03/13/22 0504 03/13/22 0718  BP: 128/66 126/74 (!) 145/81 125/60  Pulse: 60 61 71 (!) 55  Resp: '20 20 20 17  '$ Temp: 97.8 F (36.6 C) 97.8 F (36.6 C) 98.4 F (36.9 C) 97.8 F (36.6 C)  TempSrc: Oral Oral Oral Oral  SpO2: 100% 100% 99% 96%  Weight:  82 kg    Height:        GEN- The patient is well appearing, alert and oriented x 3  today.   HEENT: normocephalic, atraumatic; sclera clear, conjunctiva pink; hearing intact; oropharynx clear; neck supple Lungs- Clear to ausculation bilaterally, normal work of breathing.  No wheezes, rales, rhonchi Heart- Regular rate and rhythm, no murmurs, rubs or gallops GI- soft, non-tender, non-distended, bowel sounds present Extremities- no clubbing, cyanosis, or edema; DP/PT/radial pulses 2+ bilaterally MS- no significant deformity or atrophy Skin- warm and dry, no rash or lesion Psych- euthymic mood, full affect  Neuro- strength and sensation are intact  Labs:   Lab Results  Component Value Date   WBC 11.6 (H) 03/12/2022   HGB 11.1 (L) 03/12/2022   HCT 34.4 (L) 03/12/2022   MCV 94.5 03/12/2022   PLT 195 03/12/2022    Recent Labs  Lab 03/12/22 0843  NA 138  K 3.9  CL 107  CO2 23  BUN 24*  CREATININE 0.92  CALCIUM 8.9  PROT 5.6*  BILITOT 1.4*  ALKPHOS 40  ALT 14  AST 21  GLUCOSE 131*      Radiology/Studies: CT HIP RIGHT W CONTRAST  Result Date: 03/12/2022 CLINICAL DATA:  Hip trauma, pain EXAM: CT OF THE LOWER RIGHT EXTREMITY WITH CONTRAST TECHNIQUE: Multidetector CT imaging of the lower right extremity was performed according to the standard protocol following intravenous contrast administration. RADIATION DOSE REDUCTION: This exam was performed according to the departmental dose-optimization program which includes automated exposure control, adjustment of the mA and/or kV according to patient size and/or use of iterative reconstruction technique. CONTRAST:  168m OMNIPAQUE IOHEXOL 300 MG/ML  SOLN COMPARISON:  12/07/2021 FINDINGS: Bones/Joint/Cartilage Generalized osteopenia. No acute fracture or dislocation. Normal alignment. Moderate osteoarthritis of the right hip. Mild osteoarthritis of the right SI joint. Severe bilateral facet arthropathy at L5-S1. No joint effusion. Ligaments Ligaments are suboptimally evaluated by CT. Muscles and Tendons No muscle atrophy.  Large intramuscular hematoma in the right gluteus maximus muscle measuring at least 11 x 5.6 x 11.4 cm. Soft tissue edema in the subcutaneous fat overlying the right greater trochanter likely reflecting a soft tissue contusion. Soft tissue No fluid collection or hematoma. No soft tissue mass. Peripheral vascular atherosclerotic disease. Prostatic radiation seeds noted. IMPRESSION: 1. Large intramuscular hematoma in the right gluteus maximus muscle measuring at least 11 x 5.6 x 11.4 cm. Soft tissue contusion overlying the right greater trochanter. 2.  No acute osseous injury of the right hip. 3. Moderate osteoarthritis of the right hip. Electronically Signed   By: HKathreen DevoidM.D.   On: 03/12/2022 11:41   CT HIP RIGHT WO CONTRAST  Result Date: 03/09/2022 CLINICAL DATA:  Right hip pain after fall 3 days ago EXAM: CT OF THE RIGHT HIP WITHOUT CONTRAST TECHNIQUE: Multidetector CT imaging of the right hip was performed according to the standard protocol. Multiplanar CT image reconstructions were also generated. RADIATION DOSE REDUCTION: This exam was performed according to the departmental dose-optimization program which includes automated exposure control, adjustment of the mA and/or kV according to patient size and/or use of iterative reconstruction technique. COMPARISON:  CT 11/25/2018, 08/20/2013 FINDINGS: Bones/Joint/Cartilage No acute fracture. No dislocation. No evidence of femoral head avascular necrosis. Mild-moderate osteoarthritis of the right hip with joint space narrowing and marginal osteophyte formation. No appreciable hip joint effusion. Included portion of the right hemipelvis appears intact without evidence of fracture or diastasis. 13 mm sclerotic lesion in the supra-acetabular right ilium is stable from 2015, benign. No suspicious lytic or sclerotic bone lesion. Ligaments Suboptimally assessed by CT. Muscles and Tendons No acute musculotendinous abnormality by CT. Soft tissues Soft tissue edema  and some ill-defined hyperdense fluid within the subcutaneous tissues overlying the posterolateral aspect of the right hip, likely posttraumatic. No well-defined hematoma. Brachytherapy seeds within the prostate bed. Colonic diverticulosis. Atherosclerotic vascular calcifications. No right inguinal lymphadenopathy. IMPRESSION: 1. No acute fracture or dislocation of the right hip. 2. Mild-moderate osteoarthritis of the right hip. 3. Soft tissue edema and some ill-defined fluid within the subcutaneous tissues overlying the posterolateral aspect of  the right hip, likely posttraumatic. No well-defined hematoma. Electronically Signed   By: Davina Poke D.O.   On: 03/09/2022 15:56   Color Fundus Photography Optos - OU - Both Eyes  Result Date: 02/16/2022 Right Eye Progression has no prior data. Disc findings include normal observations. Macula : geographic atrophy. Vessels : normal observations. Left Eye Progression has no prior data. Disc findings include normal observations. Macula : geographic atrophy, retinal pigment epithelium abnormalities. Vessels : normal observations. Notes Large macular hemorrhage subretinal along the superotemporal arcade superior to the geographic atrophy in the FAZ OD.  This particular sign of wet AMD poses enlargement of macular scotoma and vision loss.  Goal today is to protect 20/200's vision in the right eye OS old disciform scar encompasses the entire clinicians macula.  No active edges OS observe  Intravitreal Injection, Pharmacologic Agent - OD - Right Eye  Result Date: 02/16/2022 Time Out 02/16/2022. 2:51 PM. Confirmed correct patient, procedure, site, and patient consented. Anesthesia Topical anesthesia was used. Procedure Preparation included 5% betadine to ocular surface, 10% betadine to eyelids, Tobramycin 0.3%. A 30 gauge needle was used. Injection: 1.25 mg Bevacizumab 1.'25mg'$ /0.23m   Route: Intravitreal, Site: Right Eye   NDC: [id number]-op Post injection exam  found visual acuity of at least counting fingers. The patient tolerated the procedure well. There were no complications. The patient received written and verbal post procedure care education. Post injection medications included gentamicin.    EKG:on arrival shows AF with slow VR at 35 bpm (personally reviewed)  TELEMETRY: AF with slow VR 40-60s (personally reviewed)  Assessment/Plan: 1.  AF with slow VR 2. Bradycardia Rates as low as 40s back into 2019. Since admission he has had no further significant bradycardia.  Other than episode on arrival in setting of pain and pain medication, no s/s of bradycardia.  Suspect vagal component and acute exacerbation of chronic issue, that is generally well tolerated.  At this time, no urgent indication for pacing, and pt would prefer to avoid procedures if possible.   Dr. LQuentin Oreto see. Leave off eliquis and clear liquids for the am, but suspect no plans for pacing at this time.   For questions or updates, please contact [facility] consult [email or web address] for contact info under Cardiology/STEMI.  SJacalyn Lefevre PA-C  03/13/2022 8:43 AM

## 2022-03-13 NOTE — Progress Notes (Signed)
Inpatient Rehab Admissions Coordinator:  ° °Per therapy recommendation,  patient was screened for CIR candidacy by Jen Benedict, MS, CCC-SLP. At this time, Pt. Appears to be a a potential candidate for CIR. I will place   order for rehab consult per protocol for full assessment. Please contact me any with questions. ° °Marilouise Densmore, MS, CCC-SLP °Rehab Admissions Coordinator  °[phone] (celll) °[phone] (office) ° °

## 2022-03-13 NOTE — Progress Notes (Signed)
ANTICOAGULATION CONSULT NOTE - Initial Consult  Pharmacy Consult for Eliquis Indication: atrial fibrillation  No Known Allergies  Patient Measurements: Height: '6\' 2"'$  (188 cm) Weight: 82 kg (180 lb 12.4 oz) IBW/kg (Calculated) : 82.2  Vital Signs: Temp: 97.8 F (36.6 C) (08/21 0718) Temp Source: Oral (08/21 0718) BP: 125/60 (08/21 0718) Pulse Rate: 55 (08/21 0718)  Labs: Recent Labs    03/12/22 0843 03/13/22 0759  HGB 11.1* 9.5*  HCT 34.4* 29.7*  PLT 195 199  CREATININE 0.92 0.95  CKTOTAL 75  --     Estimated Creatinine Clearance: 56.3 mL/min (by C-G formula based on SCr of 0.95 mg/dL).   Medical History: Past Medical History:  Diagnosis Date   Anxiety    Arthritis    "in the fingers" per pt   Atrial fibrillation ([place])    Bladder neck contracture    Dysrhythmia    A-FIB / HX BRADYCARDIA - FOLLOWED BY DR. Buxton DUE TO [name]   Gross hematuria    History of gout    History of prostate cancer    S/P RADIOACTIVE SEED IMPLANTS   History of squamous cell carcinoma of skin    EXCISION LOWER LIP AND RIGHT EAR   History of TIA (transient ischemic attack)    probable tia no residual  09-09-2012   Hyperlipidemia    Hypertension    Macular degeneration    both eyes   Urethral stricture    Urinary retention    Assessment: 88 yr old male on Eliquis 5 mg BID PTA for atrial fibrillation.  Eliquis given 8/20 pm, then held.  Recent fall at home and has right hip hematoma/bruising. No fracture per outpatient CT 03/09/22. Ortho added TXA 1300 mg PO BID for 1 week to try to minimize bleeding. To resume Eliquis as risk of cardiac complications are more concerning that risk for slightly increased bleeding at the hip.  EP has evaluated bradycardia, no further significant episodes and not planning pacer.    88 years old, but > 60 kg and creatinine < 1.5, so no need to reduce Eliquis.  Goal of Therapy:  Appropriate Eliquis dose for indication Monitor  platelets by anticoagulation protocol: Yes   Plan:  Resume Eliquis 5 mg PO BID. Intermittent CBC. Monitor for signs/symptoms of bleeding.  Arty Baumgartner, RPh 03/13/2022,12:38 PM

## 2022-03-13 NOTE — Evaluation (Signed)
Physical Therapy Evaluation Patient Details Name: Justin Potter MRN: [id number] DOB: June 17, 1926 Today's Date: 03/13/2022  History of Present Illness  Pt is a 88 yo male who presented on 03/12/22 with R hip pain from a recent fall at home with CT showing large hematoma of the gluteus maximus, no fx. In ED pt also with bradycardia and hypotension. PMH: HTN and hypotension, Afib, blindness, gout, prostate cancer, TIA, HLD  Clinical Impression  Pt admitted with above diagnosis. Pt received in bed with his son present. He lives in independent living at [facility]. Has been receiving PT there until pain level increased. He currently needs max A to come to EOB and stand from elevated surface to RW. All mvmt limited by R glut pain. Pt unable to ambualte in standing sue to pain but maintain standing with pregait activities before feeling dizzy and light headed. BP dropped from 126/62 to 99/57. Returned to 139/81 in supine. Pt highly motivated to return to independent living. WOuld progress well in AIR setting if possible.  Pt currently with functional limitations due to the deficits listed below (see PT Problem List). Pt will benefit from skilled PT to increase their independence and safety with mobility to allow discharge to the venue listed below.          Recommendations for follow up therapy are one component of a multi-disciplinary discharge planning process, led by the attending physician.  Recommendations may be updated based on patient status, additional functional criteria and insurance authorization.  Follow Up Recommendations Acute inpatient rehab (3hours/day)      Assistance Recommended at Discharge Frequent or constant Supervision/Assistance  Patient can return home with the following  Two people to help with walking and/or transfers;A lot of help with bathing/dressing/bathroom;Assistance with cooking/housework;Assist for transportation;Help with stairs or ramp for entrance    Equipment  Recommendations None recommended by PT  Recommendations for Other Services  OT consult;Rehab consult    Functional Status Assessment Patient has had a recent decline in their functional status and demonstrates the ability to make significant improvements in function in a reasonable and predictable amount of time.     Precautions / Restrictions Precautions Precautions: Fall Restrictions Weight Bearing Restrictions: Yes RLE Weight Bearing: Weight bearing as tolerated Other Position/Activity Restrictions: blindness      Mobility  Bed Mobility Overal bed mobility: Needs Assistance Bed Mobility: Supine to Sit, Sit to Supine     Supine to sit: Max assist Sit to supine: Max assist   General bed mobility comments: pt attempted to roll onto R side but could not tolerate pain with this, tolerated pivoting to L, needed max A at BLE's, able to manage trunk on his own. Max A to BLE's for return to supine    Transfers Overall transfer level: Needs assistance Equipment used: Rolling walker (2 wheels) Transfers: Sit to/from Stand Sit to Stand: From elevated surface, Mod assist           General transfer comment: mod A for power up from elevated bed    Ambulation/Gait             Pre-gait activities: wt shifting R and L General Gait Details: pt unable to take steps today due to pain, especially had increased pain with wt shifting R to step L foot  Stairs            Wheelchair Mobility    Modified Rankin (Stroke Patients Only)       Balance Overall balance assessment: Needs assistance, History  of Falls Sitting-balance support: Feet supported Sitting balance-Leahy Scale: Fair     Standing balance support: Bilateral upper extremity supported, Reliant on assistive device for balance Standing balance-Leahy Scale: Poor Standing balance comment: posterior lean noted, needed min A to maintain static standing, became dizzy with +orthostasis at 3 mins standing                              Pertinent Vitals/Pain Pain Assessment Pain Assessment: 0-10 Pain Score: 10-Worst pain ever Pain Location: R glut Pain Descriptors / Indicators: Sore Pain Intervention(s): Limited activity within patient's tolerance, Monitored during session    Home Living Family/patient expects to be discharged to:: Private residence Living Arrangements: Alone Available Help at Discharge: Family;Available PRN/intermittently Type of Home: Apartment Home Access: Level entry       Home Layout: One level Home Equipment: Rollator (4 wheels);Cane - single point;Shower seat (adjustable bed) Additional Comments: [facility] independent living, has life alert button as well as pull cords in rooms. Participates in daily exercise classes there    Prior Function Prior Level of Function : Needs assist             Mobility Comments: ambulates independently with rollator, has been working with PT ADLs Comments: independent before this injury     Hand Dominance        Extremity/Trunk Assessment   Upper Extremity Assessment Upper Extremity Assessment: Generalized weakness    Lower Extremity Assessment Lower Extremity Assessment: RLE deficits/detail;Generalized weakness RLE Deficits / Details: hip flex 2-/5, knee ext 2+/5, ankle WFL though swelling noted RLE Sensation: WNL RLE Coordination: decreased gross motor    Cervical / Trunk Assessment Cervical / Trunk Assessment: Normal  Communication   Communication: No difficulties  Cognition Arousal/Alertness: Awake/alert Behavior During Therapy: WFL for tasks assessed/performed Overall Cognitive Status: Within Functional Limits for tasks assessed                                          General Comments General comments (skin integrity, edema, etc.): BP 126/62 supine before mobility, 99/57 at 3 mins standing, 139/81 in supine, HR 60 bpm    Exercises General Exercises - Lower Extremity Ankle  Circles/Pumps: AROM, Both, 20 reps, Supine Long Arc Quad: AROM, Both, 5 reps, Seated   Assessment/Plan    PT Assessment Patient needs continued PT services  PT Problem List Decreased strength;Decreased range of motion;Decreased activity tolerance;Decreased balance;Decreased mobility;Decreased coordination;Pain;Cardiopulmonary status limiting activity       PT Treatment Interventions DME instruction;Gait training;Functional mobility training;Therapeutic activities;Therapeutic exercise;Balance training;Neuromuscular re-education;Patient/family education    PT Goals (Current goals can be found in the Care Plan section)  Acute Rehab PT Goals Patient Stated Goal: return to independent living PT Goal Formulation: With patient Time For Goal Achievement: 03/27/22 Potential to Achieve Goals: Good    Frequency Min 3X/week     Co-evaluation               AM-PAC PT "6 Clicks" Mobility  Outcome Measure Help needed turning from your back to your side while in a flat bed without using bedrails?: A Lot Help needed moving from lying on your back to sitting on the side of a flat bed without using bedrails?: A Lot Help needed moving to and from a bed to a chair (including a wheelchair)?: A Lot Help needed standing up  from a chair using your arms (e.g., wheelchair or bedside chair)?: A Lot Help needed to walk in hospital room?: Total Help needed climbing 3-5 steps with a railing? : Total 6 Click Score: 10    End of Session Equipment Utilized During Treatment: Gait belt Activity Tolerance: Patient limited by pain;Treatment limited secondary to medical complications (Comment) ([place]) Patient left: in bed;with bed alarm set;with family/visitor present Nurse Communication: Mobility status PT Visit Diagnosis: Unsteadiness on feet (R26.81);Muscle weakness (generalized) (M62.81);Pain;Difficulty in walking, not elsewhere classified (R26.2) Pain - Right/Left: Right Pain - part of body: Hip     Time: 0034-9179 PT Time Calculation (min) (ACUTE ONLY): 49 min   Charges:   PT Evaluation $PT Eval Moderate Complexity: 1 Mod PT Treatments $Gait Training: 8-22 mins $Therapeutic Activity: 8-22 mins        Leighton Roach, PT  Acute Rehab Services Secure chat preferred Office [place] 03/13/2022, 11:12 AM

## 2022-03-13 NOTE — Progress Notes (Signed)
  Progress Note   Patient: Justin Potter XLK:[id number] DOB: 06-22-1926 DOA: 03/12/2022     0 DOS: the patient was seen and examined on 03/13/2022 at 1:23PM      Brief hospital course: Justin Potter is a 88 y.o. M with prosCA, pers AF on ELiquis, hx bradycardia, hx TIA, HTN, HLD who presented with with increasing pain after a fall 6 days ago.  In the ER, incidentally noted to be hypotensive and bradycardic in the 30s.  Cardiology were consulted who recommended EP evaluation.     Assessment and Plan: * Hematoma of right hip On ELiquis chronically, fell 1 week ago, now with hematoma and pain.  CT of right hip shows large intragluteal hematoma 11cm in largest dimension.  Unable to bear weight - Orthopedics have started Tranexamic acid and recommended resuming Eliquis - Defer tranexamic acid to orthopedics, unfamiliar with this indication and would assume it increases cardioembolic risk - PT eval   Junctional bradycardia [name] with outpatient monitoring in 2021 that showed "Continuous atrial fibrillation. While awake heart rates generally above 50 bpm. Slow ventricular response with nocturnal pauses greater than 3 seconds but less than 4.5 seconds on 60 occasions."  Here, he has had similar overall picture.  He did have some hypotension in the ER, in the setting of pain/vagal output and/or opiates, but none since, so EP hope to avoid pacing - EP consult - Continue telemetry  Persistent atrial fibrillation ([place]) Now 7 days out from fall/bleed.  Eliquis resumed today by EP. - Continue Eliquis   Essential hypertension Not on meds at home any more  Hyperlipidemia Not on antilipid medication  Dysphagia S/p esophageal dilation           Subjective: Feeling tired, severe pain with any movement or standing.  No confusion, no fever, no focal weakness or numbness.     Physical Exam: Vitals:   03/12/22 1924 03/13/22 0011 03/13/22 0504 03/13/22 0718  BP: 128/66 126/74 (!)  145/81 125/60  Pulse: 60 61 71 (!) 55  Resp: '20 20 20 17  '$ Temp: 97.8 F (36.6 C) 97.8 F (36.6 C) 98.4 F (36.9 C) 97.8 F (36.6 C)  TempSrc: Oral Oral Oral Oral  SpO2: 100% 100% 99% 96%  Weight:  82 kg    Height:       Elderly adult male, lying in bed, no acute distress, appears tired Slow, regular, no murmurs, no peripheral edema Respiratory rate normal, lungs clear without rales or wheezes The right hip is bruising and swelling around the right buttock, this is tender to palpation, no warmth or concern for infection. Attention normal, affect normal, judgment and insight appear normal, face symmetric, speech fluent, severe generalized weakness     Data Reviewed: Orthopedics and electrophysiology notes reviewed Hemoglobin 11 and stable Complete metabolic panel normal CK and magnesium normal CT of the hip shows a right gluteal hematoma     Family Communication: son by phone    Disposition: Status is: Inpatient Patient with left gluteal hematoma, severe pain, limited mobility due to pain.  Likely he would improve quickly with rehabilitation, and so we will proceed with this, hopefully in the next 1-2 days.           Author: Edwin Dada, MD 03/13/2022 3:22 PM  For on call review [email or web address].

## 2022-03-14 ENCOUNTER — Inpatient Hospital Stay (HOSPITAL_COMMUNITY)
Admission: RE | Admit: 2022-03-14 | Discharge: 2022-03-27 | DRG: 945 | Disposition: A | Discharge status: Home Health | Payer: Medicare Other | Source: Intra-hospital | Attending: Physical Medicine & Rehabilitation | Admitting: Physical Medicine & Rehabilitation

## 2022-03-14 ENCOUNTER — Other Ambulatory Visit: Payer: Self-pay

## 2022-03-14 ENCOUNTER — Encounter (HOSPITAL_COMMUNITY): Payer: Self-pay | Admitting: Physical Medicine & Rehabilitation

## 2022-03-14 DIAGNOSIS — H3530 Unspecified macular degeneration: Secondary | ICD-10-CM | POA: Diagnosis present

## 2022-03-14 DIAGNOSIS — H548 Legal blindness, as defined in USA: Secondary | ICD-10-CM | POA: Diagnosis present

## 2022-03-14 DIAGNOSIS — R1314 Dysphagia, pharyngoesophageal phase: Secondary | ICD-10-CM | POA: Diagnosis not present

## 2022-03-14 DIAGNOSIS — S300XXD Contusion of lower back and pelvis, subsequent encounter: Secondary | ICD-10-CM | POA: Diagnosis not present

## 2022-03-14 DIAGNOSIS — T148XXA Other injury of unspecified body region, initial encounter: Secondary | ICD-10-CM | POA: Diagnosis present

## 2022-03-14 DIAGNOSIS — F05 Delirium due to known physiological condition: Secondary | ICD-10-CM | POA: Diagnosis not present

## 2022-03-14 DIAGNOSIS — R5381 Other malaise: Secondary | ICD-10-CM | POA: Diagnosis present

## 2022-03-14 DIAGNOSIS — I4819 Other persistent atrial fibrillation: Secondary | ICD-10-CM

## 2022-03-14 DIAGNOSIS — R1310 Dysphagia, unspecified: Secondary | ICD-10-CM | POA: Diagnosis present

## 2022-03-14 DIAGNOSIS — S8011XD Contusion of right lower leg, subsequent encounter: Secondary | ICD-10-CM | POA: Diagnosis not present

## 2022-03-14 DIAGNOSIS — M109 Gout, unspecified: Secondary | ICD-10-CM | POA: Diagnosis present

## 2022-03-14 DIAGNOSIS — Z66 Do not resuscitate: Secondary | ICD-10-CM | POA: Diagnosis present

## 2022-03-14 DIAGNOSIS — D62 Acute posthemorrhagic anemia: Secondary | ICD-10-CM | POA: Diagnosis present

## 2022-03-14 DIAGNOSIS — K224 Dyskinesia of esophagus: Secondary | ICD-10-CM | POA: Diagnosis not present

## 2022-03-14 DIAGNOSIS — Z8546 Personal history of malignant neoplasm of prostate: Secondary | ICD-10-CM | POA: Diagnosis not present

## 2022-03-14 DIAGNOSIS — I4891 Unspecified atrial fibrillation: Secondary | ICD-10-CM | POA: Diagnosis present

## 2022-03-14 DIAGNOSIS — K5900 Constipation, unspecified: Secondary | ICD-10-CM | POA: Diagnosis present

## 2022-03-14 DIAGNOSIS — Z8673 Personal history of transient ischemic attack (TIA), and cerebral infarction without residual deficits: Secondary | ICD-10-CM

## 2022-03-14 DIAGNOSIS — M25551 Pain in right hip: Secondary | ICD-10-CM | POA: Diagnosis not present

## 2022-03-14 DIAGNOSIS — Z79899 Other long term (current) drug therapy: Secondary | ICD-10-CM

## 2022-03-14 DIAGNOSIS — F419 Anxiety disorder, unspecified: Secondary | ICD-10-CM | POA: Diagnosis present

## 2022-03-14 DIAGNOSIS — W19XXXD Unspecified fall, subsequent encounter: Secondary | ICD-10-CM | POA: Diagnosis present

## 2022-03-14 DIAGNOSIS — S7001XA Contusion of right hip, initial encounter: Secondary | ICD-10-CM | POA: Diagnosis not present

## 2022-03-14 DIAGNOSIS — R933 Abnormal findings on diagnostic imaging of other parts of digestive tract: Secondary | ICD-10-CM | POA: Diagnosis not present

## 2022-03-14 DIAGNOSIS — R1319 Other dysphagia: Secondary | ICD-10-CM | POA: Diagnosis not present

## 2022-03-14 DIAGNOSIS — Z7901 Long term (current) use of anticoagulants: Secondary | ICD-10-CM

## 2022-03-14 LAB — BASIC METABOLIC PANEL
Anion gap: 10 (ref 5–15)
BUN: 22 mg/dL (ref 8–23)
CO2: 25 mmol/L (ref 22–32)
Calcium: 8.4 mg/dL — ABNORMAL LOW (ref 8.9–10.3)
Chloride: 103 mmol/L (ref 98–111)
Creatinine, Ser: 0.91 mg/dL (ref 0.61–1.24)
GFR, Estimated: >60 mL/min (ref >60)
Glucose, Bld: 114 mg/dL — ABNORMAL HIGH (ref 70–99)
Potassium: 4.1 mmol/L (ref 3.5–5.1)
Sodium: 138 mmol/L (ref 135–145)

## 2022-03-14 LAB — CBC
HCT: 27.2 % — ABNORMAL LOW (ref 39.0–52.0)
Hemoglobin: 8.8 g/dL — ABNORMAL LOW (ref 13.0–17.0)
MCH: 30.8 pg (ref 26.0–34.0)
MCHC: 32.4 g/dL (ref 30.0–36.0)
MCV: 95.1 fL (ref 80.0–100.0)
Platelets: 183 10*3/uL (ref 150–400)
RBC: 2.86 MIL/uL — ABNORMAL LOW (ref 4.22–5.81)
RDW: 13.7 % (ref 11.5–15.5)
WBC: 12.2 10*3/uL — ABNORMAL HIGH (ref 4.0–10.5)
nRBC: 0 % (ref 0.0–0.2)

## 2022-03-14 MED ORDER — ACETAMINOPHEN 325 MG PO TABS
650.0000 mg | ORAL_TABLET | Freq: Four times a day (QID) | ORAL | Status: DC | PRN
  Administered 2022-03-16 – 2022-03-27 (×14): 650 mg via ORAL
  Filled 2022-03-14 (×14): qty 2

## 2022-03-14 MED ORDER — POLYETHYLENE GLYCOL 3350 17 G PO PACK: 17.0000 g | PACK | Freq: Every day | ORAL | 0 refills | Status: DC

## 2022-03-14 MED ORDER — POLYETHYLENE GLYCOL 3350 17 G PO PACK
17.0000 g | PACK | Freq: Every day | ORAL | Status: DC
  Administered 2022-03-15 – 2022-03-27 (×13): 17 g via ORAL
  Filled 2022-03-14 (×13): qty 1

## 2022-03-14 MED ORDER — OXYCODONE HCL 5 MG PO TABS
5.0000 mg | ORAL_TABLET | ORAL | Status: DC | PRN
  Administered 2022-03-15 – 2022-03-18 (×7): 5 mg via ORAL
  Filled 2022-03-14 (×7): qty 1

## 2022-03-14 MED ORDER — OCUVITE-LUTEIN PO CAPS
1.0000 | ORAL_CAPSULE | Freq: Every day | ORAL | Status: DC
  Filled 2022-03-14: qty 1

## 2022-03-14 MED ORDER — APIXABAN 5 MG PO TABS
5.0000 mg | ORAL_TABLET | Freq: Two times a day (BID) | ORAL | Status: DC
  Administered 2022-03-14 – 2022-03-27 (×26): 5 mg via ORAL
  Filled 2022-03-14 (×26): qty 1

## 2022-03-14 MED ORDER — ACETAMINOPHEN 650 MG RE SUPP: 650.0000 mg | Freq: Four times a day (QID) | RECTAL | Status: DC | PRN

## 2022-03-14 MED ORDER — TRANEXAMIC ACID 650 MG PO TABS
1300.0000 mg | ORAL_TABLET | Freq: Two times a day (BID) | ORAL | Status: DC
  Filled 2022-03-14: qty 2

## 2022-03-14 MED ORDER — PROSIGHT PO TABS
1.0000 | ORAL_TABLET | Freq: Every day | ORAL | Status: DC
  Administered 2022-03-14 – 2022-03-27 (×14): 1 via ORAL
  Filled 2022-03-14 (×14): qty 1

## 2022-03-14 MED ORDER — OXYCODONE HCL 5 MG PO TABS: 2.5000 mg | ORAL_TABLET | ORAL | 0 refills | Status: DC | PRN

## 2022-03-14 NOTE — Progress Notes (Signed)
Patient arrieved from 3 E at approximately 1515. Patient is A&O x 4, with occasional confusion. Patient denies pain or discomfort at this time. Large hematoma to right hip and lower back. Patient assisted with dinner. Call light within reach.

## 2022-03-14 NOTE — Progress Notes (Signed)
PMR Admission Coordinator Pre-Admission Assessment   Patient: Justin Potter is an 88 y.o., male MRN: [id number] DOB: [birth year] Height: '6\' 2"'  (188 cm) Weight: 81.6 kg   Insurance Information HMO:     PPO:      PCP:      IPA:      80/20:      OTHER:  PRIMARY: Medicare A & B      Policy#: [id number]      Subscriber: Patient CM Name:       Phone#:      Fax#:  Pre-Cert#:       Employer:  Benefits:  Phone #:      Name:  Eff. Date:  03/24/93    Deduct:  $1600     Out of Pocket Max:  $0     Life Max: $0 CIR:  100%     SNF: 20 full days Outpatient: 80%     Co-Pay: 20% Home Health:  100%     Co-Pay:  DME:  80%    Co-Pay: 20% Providers:  SECONDARY: [place] emp ppo        Policy#: [id number]  Group # [id number]   Phone#: [phone]   Financial Counselor:       Phone#:    The "Data Collection Information Summary" for patients in Inpatient Rehabilitation Facilities with attached "Privacy Act [place] Records" was provided and verbally reviewed with: Patient and Family   Emergency Contact Information Contact Information       Name Relation Home Work Mobile    [place] Son [phone]               Current Medical History  Patient Admitting Diagnosis: Bradycardia, fall with R gluteal hematoma   History of Present Illness: Justin Potter is a 88 year old right-handed male with history of prostate cancer with radioactive seed implant, atrial fibrillation on Eliquis, persistent bradycardia, TIA, macular degeneration, hypertension, hyperlipidemia, BPH/urinary retention.  Per chart review patient resides [facility] independent living facility.  1 level apartment.  Ambulates independently with a rollator.  Presented 03/12/2022 to [facility] with increasing right hip pain after a fall 6 days ago as well as hypotensive and bradycardic.  On 03/09/2022 he went to [facility] had a CT of hip done which showed some swelling and no fracture but noted some soft tissue edema and some  ill-defined fluid within the subcutaneous tissue overlying the posterior lateral aspect of the right hip.  He was given no pain medications at that time and returned home.  Patient noted increasing pain to her right hip.  CT of right hip follow-up showed large intramuscular hematoma in the right gluteus maximus muscle measuring at least 11 x 5.6 11.4 cm.  Soft tissue contusion.  No acute osseous injury of the right hip.  Admission chemistries unremarkable except BUN 24, hemoglobin 11.1, WBC 11,600.  He did receive consultation follow-up by Dr. Alvan Dame with conservative care.  His Eliquis was initially held due to hematoma and has since been resumed.  He did receive followed by cardiology service for history of junctional bradycardia with heart rate approximately 50 bpm and patient continued to be followed by EP currently no plan for pacer.  Hospital course anemia 8.8 monitored.  Therapy evaluations completed due to patient decreased functional mobility was admitted for a comprehensive rehab program.   [facility]'s medical record from [facility] has been reviewed by the rehabilitation admission coordinator and physician.   Past Medical History  Past Medical History:  Diagnosis Date   Anxiety     Arthritis      "in the fingers" per pt   Atrial fibrillation ([place])     Bladder neck contracture     Dysrhythmia      A-FIB / HX BRADYCARDIA - FOLLOWED BY DR. Rodey DUE TO [name]   Gross hematuria     History of gout     History of prostate cancer      S/P RADIOACTIVE SEED IMPLANTS   History of squamous cell carcinoma of skin      EXCISION LOWER LIP AND RIGHT EAR   History of TIA (transient ischemic attack)      probable tia no residual  09-09-2012   Hyperlipidemia     Hypertension     Macular degeneration      both eyes   Urethral stricture     Urinary retention        Has the patient had major surgery during 100 days prior to admission? No   Family History   family  history includes Cancer in his father and mother.   Current Medications   Current Facility-Administered Medications:    0.9 %  sodium chloride infusion, 250 mL, Intravenous, PRN, Orma Flaming, MD   acetaminophen (TYLENOL) tablet 650 mg, 650 mg, Oral, Q6H PRN **OR** acetaminophen (TYLENOL) suppository 650 mg, 650 mg, Rectal, Q6H PRN, Orma Flaming, MD   apixaban Arne Cleveland) tablet 5 mg, 5 mg, Oral, BID, Skeet Simmer, RPH, 5 mg at 03/14/22 2409   oxyCODONE (Oxy IR/ROXICODONE) immediate release tablet 5 mg, 5 mg, Oral, Q4H PRN, Orma Flaming, MD, 5 mg at 03/14/22 0031   polyethylene glycol (MIRALAX / GLYCOLAX) packet 17 g, 17 g, Oral, Daily, Orma Flaming, MD, 17 g at 03/14/22 0929   sodium chloride flush (NS) 0.9 % injection 3 mL, 3 mL, Intravenous, Q12H, Orma Flaming, MD, 3 mL at 03/13/22 0817   sodium chloride flush (NS) 0.9 % injection 3 mL, 3 mL, Intravenous, PRN, Orma Flaming, MD   tranexamic acid (LYSTEDA) tablet 1,300 mg, 1,300 mg, Oral, BID, Paralee Cancel, MD, 1,300 mg at 03/14/22 7353   Patients Current Diet:  Diet Order                  Diet Heart Room service appropriate? Yes; Fluid consistency: Thin  Diet effective now                         Precautions / Restrictions Precautions Precautions: Fall Restrictions Weight Bearing Restrictions: Yes RLE Weight Bearing: Weight bearing as tolerated Other Position/Activity Restrictions: legally blind    Has the patient had 2 or more falls or a fall with injury in the past year? No   Prior Activity Level Household: used 4 wheeled walker prior to admission Limited Community (1-2x/wk): 4 wheeled walker   Prior Functional Level Self Care: Did the patient need help bathing, dressing, using the toilet or eating? Independent   Indoor Mobility: Did the patient need assistance with walking from room to room (with or without device)? Independent   Stairs: Did the patient need assistance with internal or external stairs  (with or without device)? Independent   Functional Cognition: Did the patient need help planning regular tasks such as shopping or remembering to take medications? Independent   Patient Information Are you of Hispanic, Latino/a,or Spanish origin?: A. No, not of Hispanic, Latino/a, or Spanish origin What  is your race?: A. White Do you need or want an interpreter to communicate with a doctor or health care staff?: 0. No   Patient's Response To:  Health Literacy and Transportation Is the patient able to respond to health literacy and transportation needs?: Yes Health Literacy - How often do you need to have someone help you when you read instructions, pamphlets, or other written material from your doctor or pharmacy?: Often In the past 12 months, has lack of transportation kept you from medical appointments or from getting medications?: No In the past 12 months, has lack of transportation kept you from meetings, work, or from getting things needed for daily living?: No   Home Assistive Devices / [place] Devices/Equipment: Environmental consultant (specify type) (4 wheeled) Home Equipment: Rollator (4 wheels)   Prior Device Use: Indicate devices/aids used by the patient prior to current illness, exacerbation or injury? Walker   Current Functional Level Cognition   Overall Cognitive Status: Within Functional Limits for tasks assessed Orientation Level: Oriented X4 General Comments: on eval yesterday, pt appeared cognitively in tact as he was able to relay recent info accurately. However, apparent today that he has significant STM deficits as he remembers very little from yesterday. Question if this is his baseline vs hospital delerium?    Extremity Assessment (includes Sensation/Coordination)   Upper Extremity Assessment: Generalized weakness  Lower Extremity Assessment: RLE deficits/detail RLE Deficits / Details: hip flex 2-/5, knee ext 2+/5, ankle WFL though swelling noted RLE Sensation:  WNL RLE Coordination: decreased gross motor     ADLs   Overall ADL's : Needs assistance/impaired Eating/Feeding: Set up, Sitting Eating/Feeding Details (indicate cue type and reason): Pt eating and drinking while sitting at EOB. Requiring assistance to open containers due to visual deficits Grooming: Set up, Sitting Upper Body Bathing: Supervision/ safety, Set up, Sitting Lower Body Bathing: Minimal assistance, Sit to/from stand Upper Body Dressing : Supervision/safety, Set up, Sitting Lower Body Dressing: Maximal assistance, Sit to/from stand Lower Body Dressing Details (indicate cue type and reason): Max A to adjust socks Toilet Transfer: Minimal assistance, Rolling walker (2 wheels) (sit<>stand at EOB and small side steps towards [facility]) Functional mobility during ADLs: Minimal assistance, Rolling walker (2 wheels) General ADL Comments: Pt performing self feeding while sitting at EOB and then sit<>stand and side steps. Despite pain, very motivated.     Mobility   Overal bed mobility: Needs Assistance Bed Mobility: Supine to Sit Supine to sit: Mod assist, HOB elevated Sit to supine: Mod assist General bed mobility comments: pt toleratde pivot to EOB much better today and was able to assist with mvmt of RLE     Transfers   Overall transfer level: Needs assistance Equipment used: Rolling walker (2 wheels) Transfers: Sit to/from Stand, Bed to chair/wheelchair/BSC Sit to Stand: Min assist, From elevated surface Bed to/from chair/wheelchair/BSC transfer type:: Step pivot Step pivot transfers: Min assist General transfer comment: places hands on RW and then wt posterior and cannot stand. Pt cued to place hands on bed and then wt shifted fwd and was able to stand with min A     Ambulation / Gait / Stairs / Wheelchair Mobility   Ambulation/Gait Ambulation/Gait assistance: Herbalist (Feet): 3 Feet Assistive device: Rolling walker (2 wheels) Gait Pattern/deviations: Step-to  pattern General Gait Details: pt able to step feet today, 3' fwd and 3' bkwd with vc's for each step and move of RW. Gait velocity: decreased Gait velocity interpretation: <1.31 ft/sec, indicative of household  ambulator Pre-gait activities: wt shifting R and L     Posture / Balance Balance Overall balance assessment: Needs assistance, History of Falls Sitting-balance support: Feet supported Sitting balance-Leahy Scale: Good Standing balance support: Reliant on assistive device for balance, Bilateral upper extremity supported, During functional activity Standing balance-Leahy Scale: Poor Standing balance comment: Reliant on UE support     Special needs/care consideration Skin intact    Previous Home Environment (from acute therapy documentation) Living Arrangements: Alone  Lives With: Alone Available Help at Discharge: Family, Other (Comment) (lives at [facility], son involved in his care) Type of Home: [place] Name: [place]: One level Home Access: Level entry Bathroom Shower/Tub: Multimedia programmer: Handicapped height Bathroom Accessibility: Yes How Accessible: Accessible via walker, Accessible via wheelchair Additional Comments: [facility], has life alert button as well as pull cords in rooms. Participates in daily exercise classes there   Discharge Living Setting Plans for Discharge Living Setting: Patient's home, Alone Type of Home at Discharge: Apartment Discharge Home Layout: One level Discharge Home Access: Level entry Discharge Bathroom Shower/Tub: Walk-in shower Discharge Bathroom Toilet: Handicapped height Discharge Bathroom Accessibility: Yes How Accessible: Accessible via wheelchair, Accessible via walker Does the patient have any problems obtaining your medications?: No   Social/Family/Support Systems Anticipated Caregiver: son, Kimm Anticipated Ambulance person Information:  [phone] Caregiver Availability: Intermittent Discharge Plan Discussed with Primary Caregiver: Yes Is Caregiver In Agreement with Plan?: Yes Does Caregiver/Family have Issues with Lodging/Transportation while Pt is in Rehab?: No   Goals Patient/Family Goal for Rehab: mod I, OT, PT Expected length of stay: 10-14 days Pt/Family Agrees to Admission and willing to participate: Yes   Decrease burden of Care through IP rehab admission: OtherN/A   Possible need for SNF placement upon discharge: not anticipated   Patient Condition: I have reviewed medical records from [facility], spoken with  TOC , and patient and son. I met with patient at the bedside and discussed via phone for inpatient rehabilitation assessment.  Patient will benefit from ongoing PT and OT, can actively participate in 3 hours of therapy a day 5 days of the week, and can make measurable gains during the admission.  Patient will also benefit from the coordinated team approach during an Inpatient Acute Rehabilitation admission.  The patient will receive intensive therapy as well as Rehabilitation physician, nursing, social worker, and care management interventions.  Due to safety, skin/wound care, disease management, medication administration, pain management, and patient education the patient requires 24 hour a day rehabilitation nursing.  The patient is currently Mod A with mobility and basic ADLs.  Discharge setting and therapy post discharge at home with home health is anticipated.  Patient has agreed to participate in the Acute Inpatient Rehabilitation Program and will admit today.   Preadmission Screen Completed By:  Nelly Laurence, 03/14/2022 12:44 PM ______________________________________________________________________   Discussed status with Dr. Tressa Busman on 03/14/22 at 12:00pm and received approval for admission today.   Admission Coordinator:  Nelly Laurence, time 12:58 pm/Date 03/14/22     Assessment/Plan: Diagnosis: Does the need for close, 24 hr/day Medical supervision in concert with the patient's rehab needs make it unreasonable for this patient to be served in a less intensive setting? Yes Co-Morbidities requiring supervision/potential complications: Acute blood loss anemia s/p hematoma, Hx stroke with hold of Eliquis, pain control, urinary incontinence, a fib, hypertension, and blindness.  Due to bladder management, safety, skin/wound care, medication administration, pain management, and patient education, does  the patient require 24 hr/day rehab nursing? Yes Does the patient require coordinated care of a physician, rehab nurse, PT, and OT to address physical and functional deficits in the context of the above medical diagnosis(es)? Yes Addressing deficits in the following areas: balance, endurance, locomotion, strength, transferring, bowel/bladder control, bathing, dressing, and toileting Can the patient actively participate in an intensive therapy program of at least 3 hrs of therapy 5 days a week? Yes The potential for patient to make measurable gains while on inpatient rehab is excellent Anticipated functional outcomes upon discharge from inpatient rehab: modified independent PT, modified independent OT Estimated rehab length of stay to reach the above functional goals is: 7-14 days Anticipated discharge destination: Home 10. Overall Rehab/Functional Prognosis: excellent     MD Signature:   Gertie Gowda, DO 03/14/2022

## 2022-03-14 NOTE — Plan of Care (Signed)
Rehab

## 2022-03-14 NOTE — TOC Progression Note (Signed)
Transition of Care [facility]) - Progression Note    Patient Details  Name: Justin Potter MRN: [id number] Date of Birth: [birth year]  [facility] CM/SW Contact  Zenon Mayo, RN Phone Number: 03/14/2022, 11:03 AM  Clinical Narrative:    from [facility], s/p fall at ALF, has r hip massive hematoma, physical therapy rec CIR. TOC following.        Expected Discharge Plan and Services                                                 Social Determinants of Health (SDOH) Interventions    Readmission Risk Interventions     No data to display

## 2022-03-14 NOTE — Progress Notes (Addendum)
Inpatient Rehab Coordinator Note:  Addendum: Patient meets medical necessity for CIR. Will move forward with potential CIR admission today.      I met with patient at bedside to discuss CIR recommendations and goals/expectations of CIR stay.  We reviewed 3 hrs/day of therapy, physician follow up, and average length of stay 2 weeks (dependent upon progress) with goals of mod I. Patient lives at [facility] in [place]. Son is involved in his care and visits patient regularly. He is interested in CIR. However need to ensure that patient meets medical necessity. Will continue to follow.      Rehab Admissons Coordinator [place], [place], [place] [phone]

## 2022-03-14 NOTE — Progress Notes (Addendum)
Inpatient Rehabilitation Admission Medication Review by a Pharmacist  A complete drug regimen review was completed for this patient to identify any potential clinically significant medication issues.  High Risk Drug Classes Is patient taking? Indication by Medication  Antipsychotic No   Anticoagulant Yes Apixaban - AF  Antibiotic No   Opioid Yes Oxycodone - pain  Antiplatelet No   Hypoglycemics/insulin No   Vasoactive Medication No   Chemotherapy No   Other Yes Tranexamic acid - bleeding     Type of Medication Issue Identified Description of Issue Recommendation(s)  Drug Interaction(s) (clinically significant)     Duplicate Therapy     Allergy     No Medication Administration End Date     Incorrect Dose     Additional Drug Therapy Needed     Significant med changes from prior encounter (inform family/care partners about these prior to discharge).    Other  Tranexamic acid was discontinued at DC but resume on Rehab Reach out to Dr Tressa Busman to dc it - ok to stop    Clinically significant medication issues were identified that warrant physician communication and completion of prescribed/recommended actions by midnight of the next day:  [facility]  Name of provider notified for urgent issues identified: Dr Tressa Busman  Provider Method of Notification: secure chat    Pharmacist comments:   Time spent performing this drug regimen review (minutes):  [place], PharmD, [place], AAHIVP, CPP Infectious Disease Pharmacist 03/14/2022 3:50 PM

## 2022-03-14 NOTE — Progress Notes (Signed)
Physical Therapy Treatment Patient Details Name: Justin Potter MRN: [id number] DOB: [birth year] Today's Date: 03/14/2022   History of Present Illness 88 yo male who presented on 03/12/22 with R hip pain from a recent fall at home with CT showing large hematoma of the gluteus maximus, no fx. In ED pt also with bradycardia and hypotension. PMH: HTN and hypotension, Afib, blindness, gout, prostate cancer, TIA, HLD    PT Comments    Pt's R hip feeling a little better today and pt tolerating more mobility. Pt able to assist with mvmt of RLE to EOB. Mod A needed to come to sitting. Pt attempted standing on his own and unable to achieve this, partially due to hands on RW and posterior lean. With cues for hands on bed pt was able to stand with min A. Began gait training fwd and bkwd along bedside with RW. Pt in recliner end of session. Pt with noted STM deficits today, unsure whether this is his baseline. PT will continue to follow.    Recommendations for follow up therapy are one component of a multi-disciplinary discharge planning process, led by the attending physician.  Recommendations may be updated based on patient status, additional functional criteria and insurance authorization.  Follow Up Recommendations  Acute inpatient rehab (3hours/day)     Assistance Recommended at Discharge Frequent or constant Supervision/Assistance  Patient can return home with the following Two people to help with walking and/or transfers;A lot of help with bathing/dressing/bathroom;Assistance with cooking/housework;Assist for transportation;Help with stairs or ramp for entrance   Equipment Recommendations  None recommended by PT    Recommendations for Other Services Rehab consult     Precautions / Restrictions Precautions Precautions: Fall Restrictions Weight Bearing Restrictions: Yes RLE Weight Bearing: Weight bearing as tolerated Other Position/Activity Restrictions: blindness     Mobility  Bed  Mobility Overal bed mobility: Needs Assistance Bed Mobility: Supine to Sit     Supine to sit: Mod assist, HOB elevated     General bed mobility comments: pt toleratde pivot to EOB much better today and was able to assist with mvmt of RLE    Transfers Overall transfer level: Needs assistance Equipment used: Rolling walker (2 wheels) Transfers: Sit to/from Stand, Bed to chair/wheelchair/BSC Sit to Stand: Min assist, From elevated surface   Step pivot transfers: Min assist       General transfer comment: places hands on RW and then wt posterior and cannot stand. Pt cued to place hands on bed and then wt shifted fwd and was able to stand with min A    Ambulation/Gait Ambulation/Gait assistance: Min assist Gait Distance (Feet): 3 Feet Assistive device: Rolling walker (2 wheels) Gait Pattern/deviations: Step-to pattern Gait velocity: decreased Gait velocity interpretation: <1.31 ft/sec, indicative of household ambulator   General Gait Details: pt able to step feet today, 3' fwd and 3' bkwd with vc's for each step and move of RW.   Stairs             Wheelchair Mobility    Modified Rankin (Stroke Patients Only)       Balance Overall balance assessment: Needs assistance, History of Falls Sitting-balance support: Feet supported Sitting balance-Leahy Scale: Good     Standing balance support: Reliant on assistive device for balance, Bilateral upper extremity supported, During functional activity Standing balance-Leahy Scale: Poor Standing balance comment: Reliant on UE support  Cognition Arousal/Alertness: Awake/alert Behavior During Therapy: WFL for tasks assessed/performed Overall Cognitive Status: No family/caregiver present to determine baseline cognitive functioning                                 General Comments: on eval yesterday, pt appeared cognitively in tact as he was able to relay recent info  accurately. However, apparent today that he has significant STM deficits as he remembers very little from yesterday. Question if this is his baseline vs hospital delerium?        Exercises General Exercises - Lower Extremity Ankle Circles/Pumps: AROM, Both, 20 reps, Supine Heel Slides: AAROM, Right, 10 reps, Supine    General Comments General comments (skin integrity, edema, etc.): BP stable during mobility today, HR low 60's      Pertinent Vitals/Pain Pain Assessment Pain Assessment: Faces Faces Pain Scale: Hurts even more Pain Location: R glut Pain Descriptors / Indicators: Sore Pain Intervention(s): Limited activity within patient's tolerance, Monitored during session, Ice applied    Home Living                          Prior Function            PT Goals (current goals can now be found in the care plan section) Acute Rehab PT Goals Patient Stated Goal: return to independent living PT Goal Formulation: With patient Time For Goal Achievement: 03/27/22 Potential to Achieve Goals: Good Progress towards PT goals: Progressing toward goals    Frequency    Min 3X/week      PT Plan Current plan remains appropriate    Co-evaluation              AM-PAC PT "6 Clicks" Mobility   Outcome Measure  Help needed turning from your back to your side while in a flat bed without using bedrails?: A Lot Help needed moving from lying on your back to sitting on the side of a flat bed without using bedrails?: A Lot Help needed moving to and from a bed to a chair (including a wheelchair)?: A Lot Help needed standing up from a chair using your arms (e.g., wheelchair or bedside chair)?: A Lot Help needed to walk in hospital room?: A Lot Help needed climbing 3-5 steps with a railing? : Total 6 Click Score: 11    End of Session Equipment Utilized During Treatment: Gait belt Activity Tolerance: Patient tolerated treatment well Patient left: in chair;with chair alarm  set;with call bell/phone within reach Nurse Communication: Mobility status PT Visit Diagnosis: Unsteadiness on feet (R26.81);Muscle weakness (generalized) (M62.81);Pain;Difficulty in walking, not elsewhere classified (R26.2) Pain - Right/Left: Right Pain - part of body: Hip     Time: 0925-0957 PT Time Calculation (min) (ACUTE ONLY): 32 min  Charges:  $Gait Training: 8-22 mins $Therapeutic Activity: 8-22 mins                     [place] chat preferred Office [place] 03/14/2022, 11:02 AM

## 2022-03-14 NOTE — H&P (Signed)
Physical Medicine and Rehabilitation Admission H&P    Chief Complaint  Patient presents with   Fall  : HPI: Justin Potter is a 88 year old right-handed male with history of prostate cancer with radioactive seed implant, atrial fibrillation on Eliquis, persistent bradycardia, TIA, macular degeneration, hypertension, hyperlipidemia, BPH/urinary retention.  Per chart review patient resides [facility].  1 level apartment.  Ambulates independently with a rollator.  Presented 03/12/2022 with increasing right hip pain after a fall 6 days ago as well as hypotensive and bradycardic.  On 03/09/2022 he went to [facility] had a CT of hip done which showed some swelling and no fracture but noted some soft tissue edema and some ill-defined fluid within the subcutaneous tissue overlying the posterior lateral aspect of the right hip.  He was given no pain medications at that time and returned home.  Patient noted increasing pain to her right hip.  CT of right hip follow-up showed large intramuscular hematoma in the right gluteus maximus muscle measuring at least 11 x 5.6 11.4 cm.  Soft tissue contusion.  No acute osseous injury of the right hip.  Admission chemistries unremarkable except BUN 24, hemoglobin 11.1, WBC 11,600.  He did receive consultation follow-up by Dr. Alvan Dame with conservative care.  His Eliquis was initially held due to hematoma and has since been resumed.  He did receive followed by cardiology service for history of junctional bradycardia with heart rate approximately 50 bpm and patient continued to be followed by EP currently no plan for pacer.  Hospital course anemia 8.8 monitored.  Therapy evaluations completed due to patient decreased functional mobility was admitted for a comprehensive rehab program.  Review of Systems  Constitutional:  Negative for chills and fever.  HENT:  Negative for hearing loss.   Eyes:  Negative for blurred vision and double vision.       Poor  vision due to macular degeneration  Respiratory:  Negative for cough and shortness of breath.   Cardiovascular:  Positive for palpitations. Negative for chest pain and leg swelling.  Gastrointestinal:  Positive for constipation. Negative for heartburn.  Genitourinary:  Positive for hematuria and urgency. Negative for dysuria and flank pain.  Musculoskeletal:  Positive for joint pain and myalgias.       Recent fall 6 days ago  Skin:  Negative for rash.  Psychiatric/Behavioral:         Anxiety  All other systems reviewed and are negative.  Past Medical History:  Diagnosis Date   Anxiety    Arthritis    "in the fingers" per pt   Atrial fibrillation ([place])    Bladder neck contracture    Dysrhythmia    A-FIB / HX BRADYCARDIA - FOLLOWED BY DR. Carlin DUE TO [name]   Gross hematuria    History of gout    History of prostate cancer    S/P RADIOACTIVE SEED IMPLANTS   History of squamous cell carcinoma of skin    EXCISION LOWER LIP AND RIGHT EAR   History of TIA (transient ischemic attack)    probable tia no residual  09-09-2012   Hyperlipidemia    Hypertension    Macular degeneration    both eyes   Urethral stricture    Urinary retention    Past Surgical History:  Procedure Laterality Date   APPENDECTOMY     CATARACT EXTRACTION W/ INTRAOCULAR LENS  IMPLANT, BILATERAL     CHOLECYSTECTOMY  1977   CYSTOSCOPY WITH URETHRAL  DILATATION N/A 02/12/2013   Procedure: CYSTOSCOPY WITH BALLOON DILATATION OF URETHRAL STRICTURE AND BLADDER FULGERATION;  Surgeon: Bernestine Amass, MD;  Location: [facility];  Service: Urology;  Laterality: N/A;   ESOPHAGOGASTRODUODENOSCOPY (EGD) WITH PROPOFOL N/A 11/02/2020   Procedure: ESOPHAGOGASTRODUODENOSCOPY (EGD) WITH PROPOFOL;  Surgeon: Clarene Essex, MD;  Location: [facility];  Service: Endoscopy;  Laterality: N/A;   LAPAROSCOPIC APPENDECTOMY N/A 08/22/2013   Procedure: APPENDECTOMY LAPAROSCOPIC;  Surgeon: Pedro Earls, MD;  Location:  [facility];  Service: General;  Laterality: N/A;   [place] N/A 11/02/2020   Procedure: SAVORY DILATION;  Surgeon: Clarene Essex, MD;  Location: [facility];  Service: Endoscopy;  Laterality: N/A;   TONSILLECTOMY  as child   TOTAL KNEE ARTHROPLASTY Right 11-23-2004   Family History  Problem Relation Age of Onset   Cancer Mother    Cancer Father    Social History:  reports that he quit smoking about 43 years ago. His smoking use included cigarettes. He has a 52.50 pack-year smoking history. He has never used smokeless tobacco. He reports current alcohol use. He reports that he does not use drugs.  Allergies: No Known Allergies Medications Prior to Admission  Medication Sig Dispense Refill   acetaminophen (TYLENOL) 500 MG tablet Take 1,000 mg by mouth in the morning and at bedtime.     ELIQUIS 5 MG TABS tablet Take 1 tablet (5 mg total) by mouth 2 (two) times daily. 180 tablet 2   Multiple Vitamins-Minerals (OCUVITE PRESERVISION PO) Take 1 tablet by mouth every morning.      Pediatric Multiple Vit-C-FA (ANIMAL CHEWABLE MULTIVITAMIN PO) Take 1 tablet by mouth daily.        Home: Home Living Family/patient expects to be discharged to:: Private residence Living Arrangements: Alone Available Help at Discharge: Family, Available PRN/intermittently Type of Home: Apartment Home Access: Level entry [place]: One level Bathroom Shower/Tub: Multimedia programmer: Handicapped height [place]: [place] (4 wheels), [place] - single point, Shower seat (adjustable bed) Additional Comments: [facility] independent [facility], has life alert button as well as pull cords in rooms. Participates in daily exercise classes there   Functional History: Prior Function Prior Level of Function : Needs assist Mobility Comments: ambulates independently with rollator, has been working with PT ADLs Comments: independent before this injury. Staff  assisting with IADLs and son managing medications  Functional Status:  Mobility: Bed Mobility Overal bed mobility: Needs Assistance Bed Mobility: Supine to Sit, Sit to Supine Supine to sit: Max assist Sit to supine: Mod assist General bed mobility comments: Max A to manage RLE and elevate trunk with patient holding therapist's hand. Pt then requiring Mod A for managing RLE in return to supine; pt managing trunk. Transfers Overall transfer level: Needs assistance Equipment used: Rolling walker (2 wheels) Transfers: Sit to/from Stand Sit to Stand: Min assist, From elevated surface General transfer comment: Min A for gaining balance. Cues throughout for weight shift forward and hand placement. Min A for safe descent. Educating pt on stepping R foot forward before descent to reduce pain Ambulation/Gait General Gait Details: pt unable to take steps today due to pain, especially had increased pain with wt shifting R to step L foot Pre-gait activities: wt shifting R and L    ADL: ADL Overall ADL's : Needs assistance/impaired Eating/Feeding: Set up, Sitting Eating/Feeding Details (indicate cue type and reason): Pt eating and drinking while sitting at EOB. Requiring assistance to open containers due to  visual deficits Grooming: Set up, Sitting Upper Body Bathing: Supervision/ safety, Set up, Sitting Lower Body Bathing: Minimal assistance, Sit to/from stand Upper Body Dressing : Supervision/safety, Set up, Sitting Lower Body Dressing: Maximal assistance, Sit to/from stand Lower Body Dressing Details (indicate cue type and reason): Max A to adjust socks Toilet Transfer: Minimal assistance, Rolling walker (2 wheels) (sit<>stand at EOB and small side steps towards [facility]) Functional mobility during ADLs: Minimal assistance, Rolling walker (2 wheels) General ADL Comments: Pt performing self feeding while sitting at EOB and then sit<>stand and side steps. Despite pain, very  motivated.  Cognition: Cognition Overall Cognitive Status: Within Functional Limits for tasks assessed Orientation Level: Oriented to person, Oriented to place, Oriented to situation Cognition Arousal/Alertness: Awake/alert Behavior During Therapy: WFL for tasks assessed/performed Overall Cognitive Status: Within Functional Limits for tasks assessed  Physical Exam: Blood pressure (!) 146/67, pulse (!) 57, temperature 98.7 F (37.1 C), temperature source Oral, resp. rate 16, height '6\' 2"'$  (1.88 m), weight 81.6 kg, SpO2 95 %.  Constitutional: No apparent distress. Appropriate appearance for age.  HENT: No JVD. Neck Supple. Trachea midline. Atraumatic, normocephalic. Eyes: PERRLA. EOMI. + partial blindness, can count fingers at approx 12 inches Cardiovascular: Irregular rhythm, regular rate, no murmurs/rub/gallops. No Edema. Peripheral pulses 2+  Respiratory: CTAB. No rales, rhonchi, or wheezing. On RA.  Abdomen: + bowel sounds, normoactive. No distention or tenderness.  GU: Not examined. + Suction catheter, draining clear urine.  Skin: C/D/I. + R gluteal hematoma MSK:      No apparent deformity.      Strength:                RUE: 5-/5 SA, 4/5 EF, 4/5 EE, 4/5 WE, 5-/5 FF, 5-/5 FA                 LUE: 5-/5 SA, 4/5 EF, 4/5 EE, 4/5 WE, 5-/5 FF, 5-/5 FA                 RLE: 3/5 HF, 4/5 KE, 5/5 DF, 5/5 EHL, 5/5 PF  - limited by pain with mobility                LLE:  5/5 HF, 5/5 KE, 5/5 DF, 5/5 EHL, 5/5 PF   Neurologic exam:  Cognition: AAO to person, place, time and event.  Language: Fluent, No substitutions or neoglisms. No dysarthria. Names 3/3 objects correctly.  Memory: Recalls 2/3 objects at 5 minutes. No apparent deficits  Insight: Good insight into current condition.  Mood: Pleasant affect, appropriate mood.  Reflexes: Negative Hoffman's and babinski signs bilaterally.  CN: 2-12 grossly intact.  Coordination: BL UE intention tremors; FTN difficult d/t vision deficits. No ataxia  on L HTS; R not performed d/t pain.  Spasticity: MAS 0 in all extremities.      Results for orders placed or performed during the hospital encounter of 03/12/22 (from the past 48 hour(s))  Basic metabolic panel     Status: Abnormal   Collection Time: 03/13/22  7:59 AM  Result Value Ref Range   Sodium 139 135 - 145 mmol/L   Potassium 4.0 3.5 - 5.1 mmol/L   Chloride 104 98 - 111 mmol/L   CO2 27 22 - 32 mmol/L   Glucose, Bld 132 (H) 70 - 99 mg/dL    Comment: Glucose reference range applies only to samples taken after fasting for at least 8 hours.   BUN 21 8 - 23 mg/dL   Creatinine, Ser 0.95  0.61 - 1.24 mg/dL   Calcium 8.8 (L) 8.9 - 10.3 mg/dL   GFR, Estimated >60 >60 mL/min    Comment: (NOTE) Calculated using the CKD-EPI Creatinine Equation (2021)    Anion gap 8 5 - 15    Comment: Performed at [facility] [address] [place], [place] [address]  CBC     Status: Abnormal   Collection Time: 03/13/22  7:59 AM  Result Value Ref Range   WBC 10.5 4.0 - 10.5 K/uL   RBC 3.12 (L) 4.22 - 5.81 MIL/uL   Hemoglobin 9.5 (L) 13.0 - 17.0 g/dL   HCT 29.7 (L) 39.0 - 52.0 %   MCV 95.2 80.0 - 100.0 fL   MCH 30.4 26.0 - 34.0 pg   MCHC 32.0 30.0 - 36.0 g/dL   RDW 13.8 11.5 - 15.5 %   Platelets 199 150 - 400 K/uL   nRBC 0.0 0.0 - 0.2 %    Comment: Performed at [facility], [place] [address], [place], [place] [address]  Basic metabolic panel     Status: Abnormal   Collection Time: 03/14/22  4:17 AM  Result Value Ref Range   Sodium 138 135 - 145 mmol/L   Potassium 4.1 3.5 - 5.1 mmol/L   Chloride 103 98 - 111 mmol/L   CO2 25 22 - 32 mmol/L   Glucose, Bld 114 (H) 70 - 99 mg/dL    Comment: Glucose reference range applies only to samples taken after fasting for at least 8 hours.   BUN 22 8 - 23 mg/dL   Creatinine, Ser 0.91 0.61 - 1.24 mg/dL   Calcium 8.4 (L) 8.9 - 10.3 mg/dL   GFR, Estimated >60 >60 mL/min    Comment: (NOTE) Calculated using the CKD-EPI Creatinine Equation (2021)     Anion gap 10 5 - 15    Comment: Performed at [facility] [address] [place], [place] [address]  CBC     Status: Abnormal   Collection Time: 03/14/22  6:39 AM  Result Value Ref Range   WBC 12.2 (H) 4.0 - 10.5 K/uL   RBC 2.86 (L) 4.22 - 5.81 MIL/uL   Hemoglobin 8.8 (L) 13.0 - 17.0 g/dL   HCT 27.2 (L) 39.0 - 52.0 %   MCV 95.1 80.0 - 100.0 fL   MCH 30.8 26.0 - 34.0 pg   MCHC 32.4 30.0 - 36.0 g/dL   RDW 13.7 11.5 - 15.5 %   Platelets 183 150 - 400 K/uL   nRBC 0.0 0.0 - 0.2 %    Comment: Performed at [facility], [place] [address] [place], [place] [address]   CT HIP RIGHT W CONTRAST  Result Date: 03/12/2022 CLINICAL DATA:  Hip trauma, pain EXAM: CT OF THE LOWER RIGHT EXTREMITY WITH CONTRAST TECHNIQUE: Multidetector CT imaging of the lower right extremity was performed according to the standard protocol following intravenous contrast administration. RADIATION DOSE REDUCTION: This exam was performed according to the departmental dose-optimization program which includes automated exposure control, adjustment of the mA and/or kV according to patient size and/or use of iterative reconstruction technique. CONTRAST:  166m OMNIPAQUE IOHEXOL 300 MG/ML  SOLN COMPARISON:  12/07/2021 FINDINGS: Bones/Joint/Cartilage Generalized osteopenia. No acute fracture or dislocation. Normal alignment. Moderate osteoarthritis of the right hip. Mild osteoarthritis of the right SI joint. Severe bilateral facet arthropathy at L5-S1. No joint effusion. Ligaments Ligaments are suboptimally evaluated by CT. Muscles and Tendons No muscle atrophy. Large intramuscular hematoma in the  right gluteus maximus muscle measuring at least 11 x 5.6 x 11.4 cm. Soft tissue edema in the subcutaneous fat overlying the right greater trochanter likely reflecting a soft tissue contusion. Soft tissue No fluid collection or hematoma. No soft tissue mass. Peripheral vascular atherosclerotic disease. Prostatic radiation seeds noted.  IMPRESSION: 1. Large intramuscular hematoma in the right gluteus maximus muscle measuring at least 11 x 5.6 x 11.4 cm. Soft tissue contusion overlying the right greater trochanter. 2.  No acute osseous injury of the right hip. 3. Moderate osteoarthritis of the right hip. Electronically Signed   By: Kathreen Devoid M.D.   On: 03/12/2022 11:41      Blood pressure (!) 146/67, pulse (!) 57, temperature 98.7 F (37.1 C), temperature source Oral, resp. rate 16, height '6\' 2"'$  (1.88 m), weight 81.6 kg, SpO2 95 %.  Medical Problem List and Plan: 1. Functional deficits secondary to large intramuscular hematoma right gluteus maximus muscle measuring at least 11 x 5.6 x 11.4 cm after fall  -patient may  shower  -ELOS/Goals: 7-14 days, with dispo home to independent living              - Excellent dispo with independent living facility, has nursing aide and PT/OT services available as needed. Son (retired Scientist, clinical (histocompatibility and immunogenetics)) lives 20-30 min away and available for assistance.               - Ambulates with 4 wheeled walked Mod I at baseline; Ind ADLs 2.  Antithrombotics: -DVT/anticoagulation:  Pharmaceutical: Eliquis - resumed Monday 8/21; HgB downtrending gradually since   -antiplatelet therapy: N/A 3. Pain Management: Low-dose oxycodone as needed              - Takes Tylenol 1000 mg BID at baseline for chronic back pain 4. Mood/Behavior/Sleep: Provide emotional support  -antipsychotic agents: N/A 5. Neuropsych/cognition: This patient is capable of making decisions on his own behalf. 6. Skin/Wound Care: Routine skin checks              - Currently foam dressings for wound prevention only; recommend floating heels in bed once admitted to [facility] for foot care as OP 7. Fluids/Electrolytes/Nutrition: Routine in and out with follow-up chemistry               - Diet advancing from full liquid 8/22 8.  Acute blood loss anemia related hematoma.                  - Follow-up CBC                 - Monitor hematoma closely for s/s enlargement 9.  Atrial fibrillation as well as bradycardia.  Eliquis resumed.  Follow-up with EP cardiology team.  No current plans for pacer                - Irregular HR but stable rate, monitor 10.  History of prostate cancer with radioactive seed implant.  Follow-up outpatient                - Baseline urinary incontinent with pads at nighttime and briefs during daytime 11.  Macular degeneration.  Resume Ocuvite.                - Pt legally blind, sees "shadows" only beyond 12 in, but can   navigate hallways and performs ADLs independently at baseline  12. Hx cardioembolic stroke  d/t Afib, no residual deficits - Of note, pt has Hx stroke off of Eliquis for colonoscopy  13. Constipation - LBM 8/20 per patient  Gertie Gowda, DO 03/14/2022

## 2022-03-14 NOTE — Progress Notes (Signed)
Mobility Specialist Progress Note:   03/14/22 1347  Mobility  Activity Transferred to/from [facility]  Level of Assistance Moderate assist, patient does 50-74%  Assistive Device  (HHA)  Distance Ambulated (ft) 2 ft  Activity Response Tolerated well  $Mobility charge 1 Mobility   Pt received in bed asking to use restroom. No complaints of pain. ModA to come to EOB. Left on BSC and will hit call button when finished.   [facility] Rashaan Wyles Mobility Specialist

## 2022-03-14 NOTE — Discharge Summary (Signed)
Physician Discharge Summary   Patient: Justin Potter MRN: [id number] DOB: 1926/01/15  Admit date:     03/12/2022  Discharge date: 03/14/22  Discharge Physician: Edwin Dada   PCP: [name], MD     Recommendations at discharge:  Follow up with Orthopedics Dr. Alvan Dame in 3 weeks Follow up with Cardiology Dr. Tamala Julian 2-4 weeks after discharge from rehab Repeat CBC in 2 days       Discharge Diagnoses: Principal Problem:   Hematoma of right hip Active Problems:   Acute blood loss anemia   Junctional bradycardia   Persistent atrial fibrillation [facility])   Essential hypertension   Hyperlipidemia   Dysphagia      Hospital Course: Justin Potter is a 88 y.o. M with prosCA, pers AF on ELiquis, hx bradycardia, hx TIA, HTN, HLD who presented with with increasing pain after a fall 6 days ago.  In the ER, CT imaging showed a hematoma of the right hip, and he had some anemia.  He was incidentally noted to be hypotensive and bradycardic in the 30s and so he was admitted for EP evaluation.         * Hematoma of right hip Patient had mechanical fall in setting of chronic Eliquis.  Developed pain and swelling of right hip, pain with weight bearing or movement.  Evaluated by Orthopedics who recommended resuming Eliquis.    Tranexamic acid was started briefly, but in setting of increased thromboembolic risk of this medication, I would not favor continuing it on discharge.      Junctional bradycardia Had outpatient cardiac monitoring one year ago that showed "Continuous atrial fibrillation. While awake heart rates generally above 50 bpm. Slow ventricular response with nocturnal pauses greater than 3 seconds but less than 4.5 seconds on 60 occasions."  Heart rates on monitoring here were similar.  His brief episode of hypotension in the ER were in the context of pain, opiates, and did not recur.     Persistent atrial fibrillation (HCC) Acute blood loss anemia >7 days out from  fall/bleed at the time of discharge.  Eliquis resumed yesterday.  Hgb slightly down today. - Check Hgb in 1 week   Essential hypertension Not on meds at home any more  Hyperlipidemia Not on antilipid medication  Dysphagia S/p esophageal dilation             The [facility] Controlled Substances Registry was reviewed for this patient prior to discharge.  Consultants: Electrophysiology, Orthopedics   Disposition: Rehabilitation facility Diet recommendation:  Discharge Diet Orders (From admission, onward)     Start     Ordered   03/14/22 0000  Diet - low sodium heart healthy        03/14/22 1445             DISCHARGE MEDICATION: Allergies as of 03/14/2022   No Known Allergies      Medication List     TAKE these medications    acetaminophen 500 MG tablet Commonly known as: TYLENOL Take 1,000 mg by mouth in the morning and at bedtime.   ANIMAL CHEWABLE MULTIVITAMIN PO Take 1 tablet by mouth daily.   Eliquis 5 MG Tabs tablet Generic drug: apixaban Take 1 tablet (5 mg total) by mouth 2 (two) times daily.   OCUVITE PRESERVISION PO Take 1 tablet by mouth every morning.   oxyCODONE 5 MG immediate release tablet Commonly known as: Oxy IR/ROXICODONE Take 0.5 tablets (2.5 mg total) by mouth every 4 (four) hours as  needed for moderate pain.   polyethylene glycol 17 g packet Commonly known as: MIRALAX / GLYCOLAX Take 17 g by mouth daily. Start taking on: March 15, 2022        Follow-up Information     Paralee Cancel, MD. Schedule an appointment as soon as possible for a visit in 3 week(s).   Specialty: Orthopedic Surgery Contact information: [address]  [place] [address] [phone]         Belva Crome, MD Follow up.   Specialty: Cardiology Contact information: [address] [address] [phone]                 Discharge Instructions     Diet - low sodium heart  healthy   Complete by: As directed    Increase activity slowly   Complete by: As directed        Discharge Exam: Filed Weights   03/12/22 1709 03/13/22 0011 03/14/22 2353  Weight: 81.3 kg 82 kg 81.6 kg    General: Pt is alert, awake, not in acute distress, sitting up, visually impaired, pleasant Cardiovascular: Slow, occasional premature contractions, nl S1-S2, no murmurs appreciated.   No LE edema.   Respiratory: Normal respiratory rate and rhythm.  CTAB without rales or wheezes. Abdominal: Abdomen soft and non-tender.  No distension or HSM.   Neuro/Psych: Strength symmetric in upper  extremities, overall generally weak, right leg testing not done due to pain.  Judgment and insight appear at baseline.   Condition at discharge: stable  The results of significant diagnostics from this hospitalization (including imaging, microbiology, ancillary and laboratory) are listed below for reference.   Imaging Studies: CT HIP RIGHT W CONTRAST  Result Date: 03/12/2022 CLINICAL DATA:  Hip trauma, pain EXAM: CT OF THE LOWER RIGHT EXTREMITY WITH CONTRAST TECHNIQUE: Multidetector CT imaging of the lower right extremity was performed according to the standard protocol following intravenous contrast administration. RADIATION DOSE REDUCTION: This exam was performed according to the departmental dose-optimization program which includes automated exposure control, adjustment of the mA and/or kV according to patient size and/or use of iterative reconstruction technique. CONTRAST:  137m OMNIPAQUE IOHEXOL 300 MG/ML  SOLN COMPARISON:  12/07/2021 FINDINGS: Bones/Joint/Cartilage Generalized osteopenia. No acute fracture or dislocation. Normal alignment. Moderate osteoarthritis of the right hip. Mild osteoarthritis of the right SI joint. Severe bilateral facet arthropathy at L5-S1. No joint effusion. Ligaments Ligaments are suboptimally evaluated by CT. Muscles and Tendons No muscle atrophy. Large intramuscular  hematoma in the right gluteus maximus muscle measuring at least 11 x 5.6 x 11.4 cm. Soft tissue edema in the subcutaneous fat overlying the right greater trochanter likely reflecting a soft tissue contusion. Soft tissue No fluid collection or hematoma. No soft tissue mass. Peripheral vascular atherosclerotic disease. Prostatic radiation seeds noted. IMPRESSION: 1. Large intramuscular hematoma in the right gluteus maximus muscle measuring at least 11 x 5.6 x 11.4 cm. Soft tissue contusion overlying the right greater trochanter. 2.  No acute osseous injury of the right hip. 3. Moderate osteoarthritis of the right hip. Electronically Signed   By: HKathreen DevoidM.D.   On: 03/12/2022 11:41   CT HIP RIGHT WO CONTRAST  Result Date: 03/09/2022 CLINICAL DATA:  Right hip pain after fall 3 days ago EXAM: CT OF THE RIGHT HIP WITHOUT CONTRAST TECHNIQUE: Multidetector CT imaging of the right hip was performed according to the standard protocol. Multiplanar CT image reconstructions were also generated. RADIATION DOSE REDUCTION: This exam was  performed according to the departmental dose-optimization program which includes automated exposure control, adjustment of the mA and/or kV according to patient size and/or use of iterative reconstruction technique. COMPARISON:  CT 11/25/2018, 08/20/2013 FINDINGS: Bones/Joint/Cartilage No acute fracture. No dislocation. No evidence of femoral head avascular necrosis. Mild-moderate osteoarthritis of the right hip with joint space narrowing and marginal osteophyte formation. No appreciable hip joint effusion. Included portion of the right hemipelvis appears intact without evidence of fracture or diastasis. 13 mm sclerotic lesion in the supra-acetabular right ilium is stable from 2015, benign. No suspicious lytic or sclerotic bone lesion. Ligaments Suboptimally assessed by CT. Muscles and Tendons No acute musculotendinous abnormality by CT. Soft tissues Soft tissue edema and some ill-defined  hyperdense fluid within the subcutaneous tissues overlying the posterolateral aspect of the right hip, likely posttraumatic. No well-defined hematoma. Brachytherapy seeds within the prostate bed. Colonic diverticulosis. Atherosclerotic vascular calcifications. No right inguinal lymphadenopathy. IMPRESSION: 1. No acute fracture or dislocation of the right hip. 2. Mild-moderate osteoarthritis of the right hip. 3. Soft tissue edema and some ill-defined fluid within the subcutaneous tissues overlying the posterolateral aspect of the right hip, likely posttraumatic. No well-defined hematoma. Electronically Signed   By: Davina Poke D.O.   On: 03/09/2022 15:56   Color Fundus Photography Optos - OU - Both Eyes  Result Date: 02/16/2022 Right Eye Progression has no prior data. Disc findings include normal observations. Macula : geographic atrophy. Vessels : normal observations. Left Eye Progression has no prior data. Disc findings include normal observations. Macula : geographic atrophy, retinal pigment epithelium abnormalities. Vessels : normal observations. Notes Large macular hemorrhage subretinal along the superotemporal arcade superior to the geographic atrophy in the FAZ OD.  This particular sign of wet AMD poses enlargement of macular scotoma and vision loss.  Goal today is to protect 20/200's vision in the right eye OS old disciform scar encompasses the entire clinicians macula.  No active edges OS observe  Intravitreal Injection, Pharmacologic Agent - OD - Right Eye  Result Date: 02/16/2022 Time Out 02/16/2022. 2:51 PM. Confirmed correct patient, procedure, site, and patient consented. Anesthesia Topical anesthesia was used. Procedure Preparation included 5% betadine to ocular surface, 10% betadine to eyelids, Tobramycin 0.3%. A 30 gauge needle was used. Injection: 1.25 mg Bevacizumab 1.'25mg'$ /0.70m   Route: Intravitreal, Site: Right Eye   NDC: [id number]-op Post injection exam found visual acuity  of at least counting fingers. The patient tolerated the procedure well. There were no complications. The patient received written and verbal post procedure care education. Post injection medications included gentamicin.    Microbiology: Results for orders placed or performed during the hospital encounter of 11/02/20  Urine culture     Status: None   Collection Time: 11/02/20  9:21 PM   Specimen: Urine, Random  Result Value Ref Range Status   Specimen Description   Final    URINE, RANDOM [facility]formed at [facility] [place] [address]   Special Requests   Final    NONE Performed at [facility] [address] [place] [address]   Culture   Final    NO GROWTH [facility]formed at [facility] [address], [place] [address]   Report Status 11/04/2020 FINAL  Final    Labs: CBC: Recent Labs  Lab 03/12/22 0843 03/13/22 0759 03/14/22 0639  WBC 11.6* 10.5 12.2*  NEUTROABS 8.8*  --   --   HGB 11.1*  9.5* 8.8*  HCT 34.4* 29.7* 27.2*  MCV 94.5 95.2 95.1  PLT 195 199 211   Basic Metabolic Panel: Recent Labs  Lab 03/12/22 0843 03/13/22 0759 03/14/22 0417  NA 138 139 138  K 3.9 4.0 4.1  CL 107 104 103  CO2 '23 27 25  '$ GLUCOSE 131* 132* 114*  BUN 24* 21 22  CREATININE 0.92 0.95 0.91  CALCIUM 8.9 8.8* 8.4*  MG 1.8  --   --    Liver Function Tests: Recent Labs  Lab 03/12/22 0843  AST 21  ALT 14  ALKPHOS 40  BILITOT 1.4*  PROT 5.6*  ALBUMIN 3.4*   CBG: No results for input(s): "GLUCAP" in the last 168 hours.  Discharge time spent: approximately 35 minutes spent on discharge counseling, evaluation of patient on day of discharge, and coordination of discharge planning with nursing, social work, pharmacy and case management  Signed: Edwin Dada, MD [facility] 03/14/2022

## 2022-03-14 NOTE — PMR Pre-admission (Signed)
PMR Admission Coordinator Pre-Admission Assessment  Patient: Justin Potter is an 88 y.o., male MRN: [id number] DOB: 1925-12-16 Height: '6\' 2"'  (188 cm) Weight: 81.6 kg  Insurance Information HMO:     PPO:      PCP:      IPA:      80/20:      OTHER:  PRIMARY: Medicare A & B      Policy#: [id number]      Subscriber: Patient CM Name:       Phone#:      Fax#:  Pre-Cert#:       Employer:  Benefits:  Phone #:      Name:  Eff. Date:  03/24/93    Deduct:  $1600     Out of Pocket Max:  $0     Life Max: $0 CIR:  100%     SNF: 20 full days Outpatient: 80%     Co-Pay: 20% Home Health:  100%     Co-Pay:  DME:  80%    Co-Pay: 20% Providers:  SECONDARY: [place] emp ppo Policy#: [id number]  Group # [id number]   Phone#: [phone]  Financial Counselor:       Phone#:   The "Data Collection Information Summary" for patients in Inpatient Rehabilitation Facilities with attached "Privacy Act [place] Records" was provided and verbally reviewed with: Patient and Family  Emergency Contact Information Contact Information     Name Relation Home Work Mobile   [place] Son [phone]         Current Medical History  Patient Admitting Diagnosis: Bradycardia, fall with R gluteal hematoma  History of Present Illness: Justin Potter is a 88 year old right-handed male with history of prostate cancer with radioactive seed implant, atrial fibrillation on Eliquis, persistent bradycardia, TIA, macular degeneration, hypertension, hyperlipidemia, BPH/urinary retention.  Per chart review patient resides [facility].  1 level apartment.  Ambulates independently with a rollator.  Presented 03/12/2022 to [facility] with increasing right hip pain after a fall 6 days ago as well as hypotensive and bradycardic.  On 03/09/2022 he went to [facility] had a CT of hip done which showed some swelling and no fracture but noted some soft tissue edema and some ill-defined fluid within the  subcutaneous tissue overlying the posterior lateral aspect of the right hip.  He was given no pain medications at that time and returned home.  Patient noted increasing pain to her right hip.  CT of right hip follow-up showed large intramuscular hematoma in the right gluteus maximus muscle measuring at least 11 x 5.6 11.4 cm.  Soft tissue contusion.  No acute osseous injury of the right hip.  Admission chemistries unremarkable except BUN 24, hemoglobin 11.1, WBC 11,600.  He did receive consultation follow-up by Dr. Alvan Dame with conservative care.  His Eliquis was initially held due to hematoma and has since been resumed.  He did receive followed by cardiology service for history of junctional bradycardia with heart rate approximately 50 bpm and patient continued to be followed by EP currently no plan for pacer.  Hospital course anemia 8.8 monitored.  Therapy evaluations completed due to patient decreased functional mobility was admitted for a comprehensive rehab program.    [facility]'s medical record from [facility] has been reviewed by the rehabilitation admission coordinator and physician.  Past Medical History  Past Medical History:  Diagnosis Date   Anxiety    Arthritis    "in the fingers" per pt   Atrial fibrillation (  [place])    Bladder neck contracture    Dysrhythmia    A-FIB / HX BRADYCARDIA - FOLLOWED BY DR. Marquez DUE TO [name]   Gross hematuria    History of gout    History of prostate cancer    S/P RADIOACTIVE SEED IMPLANTS   History of squamous cell carcinoma of skin    EXCISION LOWER LIP AND RIGHT EAR   History of TIA (transient ischemic attack)    probable tia no residual  09-09-2012   Hyperlipidemia    Hypertension    Macular degeneration    both eyes   Urethral stricture    Urinary retention     Has the patient had major surgery during 100 days prior to admission? No  Family History   family history includes Cancer in his father and  mother.  Current Medications  Current Facility-Administered Medications:    0.9 %  sodium chloride infusion, 250 mL, Intravenous, PRN, Orma Flaming, MD   acetaminophen (TYLENOL) tablet 650 mg, 650 mg, Oral, Q6H PRN **OR** acetaminophen (TYLENOL) suppository 650 mg, 650 mg, Rectal, Q6H PRN, Orma Flaming, MD   apixaban Arne Cleveland) tablet 5 mg, 5 mg, Oral, BID, Skeet Simmer, RPH, 5 mg at 03/14/22 1540   oxyCODONE (Oxy IR/ROXICODONE) immediate release tablet 5 mg, 5 mg, Oral, Q4H PRN, Orma Flaming, MD, 5 mg at 03/14/22 0031   polyethylene glycol (MIRALAX / GLYCOLAX) packet 17 g, 17 g, Oral, Daily, Orma Flaming, MD, 17 g at 03/14/22 0929   sodium chloride flush (NS) 0.9 % injection 3 mL, 3 mL, Intravenous, Q12H, Orma Flaming, MD, 3 mL at 03/13/22 0817   sodium chloride flush (NS) 0.9 % injection 3 mL, 3 mL, Intravenous, PRN, Orma Flaming, MD   tranexamic acid (LYSTEDA) tablet 1,300 mg, 1,300 mg, Oral, BID, Paralee Cancel, MD, 1,300 mg at 03/14/22 0867  Patients Current Diet:  Diet Order             Diet Heart Room service appropriate? Yes; Fluid consistency: Thin  Diet effective now                   Precautions / Restrictions Precautions Precautions: Fall Restrictions Weight Bearing Restrictions: Yes RLE Weight Bearing: Weight bearing as tolerated Other Position/Activity Restrictions: legally blind   Has the patient had 2 or more falls or a fall with injury in the past year? No  Prior Activity Level Household: used 4 wheeled walker prior to admission Limited Community (1-2x/wk): 4 wheeled walker  Prior Functional Level Self Care: Did the patient need help bathing, dressing, using the toilet or eating? Independent  Indoor Mobility: Did the patient need assistance with walking from room to room (with or without device)? Independent  Stairs: Did the patient need assistance with internal or external stairs (with or without device)? Independent  Functional  Cognition: Did the patient need help planning regular tasks such as shopping or remembering to take medications? Independent  Patient Information Are you of Hispanic, Latino/a,or Spanish origin?: A. No, not of Hispanic, Latino/a, or Spanish origin What is your race?: A. White Do you need or want an interpreter to communicate with a doctor or health care staff?: 0. No  Patient's Response To:  Health Literacy and Transportation Is the patient able to respond to health literacy and transportation needs?: Yes Health Literacy - How often do you need to have someone help you when you read instructions, pamphlets, or other written material from your doctor  or pharmacy?: Often In the past 12 months, has lack of transportation kept you from medical appointments or from getting medications?: No In the past 12 months, has lack of transportation kept you from meetings, work, or from getting things needed for daily living?: No  [place] / [place] Devices/Equipment: Environmental consultant (specify type) (4 wheeled) Home Equipment: Rollator (4 wheels)  Prior Device Use: Indicate devices/aids used by the patient prior to current illness, exacerbation or injury? Walker  Current Functional Level Cognition  Overall Cognitive Status: Within Functional Limits for tasks assessed Orientation Level: Oriented X4 General Comments: on eval yesterday, pt appeared cognitively in tact as he was able to relay recent info accurately. However, apparent today that he has significant STM deficits as he remembers very little from yesterday. Question if this is his baseline vs hospital delerium?    Extremity Assessment (includes Sensation/Coordination)  Upper Extremity Assessment: Generalized weakness  Lower Extremity Assessment: RLE deficits/detail RLE Deficits / Details: hip flex 2-/5, knee ext 2+/5, ankle WFL though swelling noted RLE Sensation: WNL RLE Coordination: decreased gross motor    ADLs   Overall ADL's : Needs assistance/impaired Eating/Feeding: Set up, Sitting Eating/Feeding Details (indicate cue type and reason): Pt eating and drinking while sitting at EOB. Requiring assistance to open containers due to visual deficits Grooming: Set up, Sitting Upper Body Bathing: Supervision/ safety, Set up, Sitting Lower Body Bathing: Minimal assistance, Sit to/from stand Upper Body Dressing : Supervision/safety, Set up, Sitting Lower Body Dressing: Maximal assistance, Sit to/from stand Lower Body Dressing Details (indicate cue type and reason): Max A to adjust socks Toilet Transfer: Minimal assistance, Rolling walker (2 wheels) (sit<>stand at EOB and small side steps towards [facility]) Functional mobility during ADLs: Minimal assistance, Rolling walker (2 wheels) General ADL Comments: Pt performing self feeding while sitting at EOB and then sit<>stand and side steps. Despite pain, very motivated.    Mobility  Overal bed mobility: Needs Assistance Bed Mobility: Supine to Sit Supine to sit: Mod assist, HOB elevated Sit to supine: Mod assist General bed mobility comments: pt toleratde pivot to EOB much better today and was able to assist with mvmt of RLE    Transfers  Overall transfer level: Needs assistance Equipment used: Rolling walker (2 wheels) Transfers: Sit to/from Stand, Bed to chair/wheelchair/BSC Sit to Stand: Min assist, From elevated surface Bed to/from chair/wheelchair/BSC transfer type:: Step pivot Step pivot transfers: Min assist General transfer comment: places hands on RW and then wt posterior and cannot stand. Pt cued to place hands on bed and then wt shifted fwd and was able to stand with min A    Ambulation / Gait / Stairs / Wheelchair Mobility  Ambulation/Gait Ambulation/Gait assistance: Herbalist (Feet): 3 Feet Assistive device: Rolling walker (2 wheels) Gait Pattern/deviations: Step-to pattern General Gait Details: pt able to step feet today, 3' fwd  and 3' bkwd with vc's for each step and move of RW. Gait velocity: decreased Gait velocity interpretation: <1.31 ft/sec, indicative of household ambulator Pre-gait activities: wt shifting R and L    Posture / Balance Balance Overall balance assessment: Needs assistance, History of Falls Sitting-balance support: Feet supported Sitting balance-Leahy Scale: Good Standing balance support: Reliant on assistive device for balance, Bilateral upper extremity supported, During functional activity Standing balance-Leahy Scale: Poor Standing balance comment: Reliant on UE support    Special needs/care consideration Skin intact   Previous Home Environment (from acute therapy documentation) Living Arrangements: Alone  Lives With: Alone Available Help  at Discharge: Family, Other (Comment) (lives at [facility], son involved in his care) Type of Home: [place] Name: [place]: One level Home Access: Level entry Bathroom Shower/Tub: Multimedia programmer: Handicapped height Bathroom Accessibility: Yes How Accessible: Accessible via walker, Accessible via wheelchair Additional Comments: [facility] independent [facility], has life alert button as well as pull cords in rooms. Participates in daily exercise classes there  Discharge Living Setting Plans for Discharge Living Setting: Patient's home, Alone Type of Home at Discharge: Apartment Discharge Home Layout: One level Discharge Home Access: Level entry Discharge Bathroom Shower/Tub: Walk-in shower Discharge Bathroom Toilet: Handicapped height Discharge Bathroom Accessibility: Yes How Accessible: Accessible via wheelchair, Accessible via walker Does the patient have any problems obtaining your medications?: No  Social/Family/Support Systems Anticipated Caregiver: son, Elizabeth Anticipated Ambulance person Information: [phone] Caregiver Availability: Intermittent Discharge Plan Discussed with  Primary Caregiver: Yes Is Caregiver In Agreement with Plan?: Yes Does Caregiver/Family have Issues with Lodging/Transportation while Pt is in Rehab?: No  Goals Patient/Family Goal for Rehab: mod I, OT, PT Expected length of stay: 10-14 days Pt/Family Agrees to Admission and willing to participate: Yes  Decrease burden of Care through IP rehab admission: OtherN/A  Possible need for SNF placement upon discharge: not anticipated  Patient Condition: I have reviewed medical records from [facility], spoken with  TOC , and patient and son. I met with patient at the bedside and discussed via phone for inpatient rehabilitation assessment.  Patient will benefit from ongoing PT and OT, can actively participate in 3 hours of therapy a day 5 days of the week, and can make measurable gains during the admission.  Patient will also benefit from the coordinated team approach during an Inpatient Acute Rehabilitation admission.  The patient will receive intensive therapy as well as Rehabilitation physician, nursing, social worker, and care management interventions.  Due to safety, skin/wound care, disease management, medication administration, pain management, and patient education the patient requires 24 hour a day rehabilitation nursing.  The patient is currently Mod A with mobility and basic ADLs.  Discharge setting and therapy post discharge at home with home health is anticipated.  Patient has agreed to participate in the Acute Inpatient Rehabilitation Program and will admit today.  Preadmission Screen Completed By:  Nelly Laurence, 03/14/2022 12:44 PM ______________________________________________________________________   Discussed status with Dr. Tressa Busman on 03/14/22 at 12:00pm and received approval for admission today.  Admission Coordinator:  Nelly Laurence, time 12:58 pm/Date 03/14/22   Assessment/Plan: Diagnosis: Does the need for close, 24 hr/day Medical supervision in concert with the patient's  rehab needs make it unreasonable for this patient to be served in a less intensive setting? Yes Co-Morbidities requiring supervision/potential complications: Acute blood loss anemia s/p hematoma, Hx stroke with hold of Eliquis, pain control, urinary incontinence, a fib, hypertension, and blindness.  Due to bladder management, safety, skin/wound care, medication administration, pain management, and patient education, does the patient require 24 hr/day rehab nursing? Yes Does the patient require coordinated care of a physician, rehab nurse, PT, and OT to address physical and functional deficits in the context of the above medical diagnosis(es)? Yes Addressing deficits in the following areas: balance, endurance, locomotion, strength, transferring, bowel/bladder control, bathing, dressing, and toileting Can the patient actively participate in an intensive therapy program of at least 3 hrs of therapy 5 days a week? Yes The potential for patient to make measurable gains while on inpatient rehab is excellent Anticipated functional outcomes upon discharge from  inpatient rehab: modified independent PT, modified independent OT Estimated rehab length of stay to reach the above functional goals is: 7-14 days Anticipated discharge destination: Home 10. Overall Rehab/Functional Prognosis: excellent   MD Signature:  Gertie Gowda, DO 03/14/2022

## 2022-03-14 NOTE — TOC Transition Note (Signed)
Transition of Care [facility]) - CM/SW Discharge Note   Patient Details  Name: SCHON ZEIDERS MRN: [id number] Date of Birth: July 15, 1926  [facility]) CM/SW Contact:  Zenon Mayo, RN Phone Number: 03/14/2022, 2:48 PM   Clinical Narrative:    Patient is for dc to CIR today, they have a bed ready for patient.    Final next level of care: IP Rehab Facility Barriers to Discharge: No Barriers Identified   Patient Goals and CMS Choice Patient states their goals for this hospitalization and ongoing recovery are:: CIR      Discharge Placement                       Discharge Plan and Services     Post Acute Care Choice: NA            DME Agency: NA       HH Arranged: NA          Social Determinants of Health (SDOH) Interventions     Readmission Risk Interventions     No data to display

## 2022-03-15 DIAGNOSIS — T148XXA Other injury of unspecified body region, initial encounter: Secondary | ICD-10-CM

## 2022-03-15 LAB — COMPREHENSIVE METABOLIC PANEL
ALT: 12 U/L (ref 0–44)
AST: 19 U/L (ref 15–41)
Albumin: 2.8 g/dL — ABNORMAL LOW (ref 3.5–5.0)
Alkaline Phosphatase: 42 U/L (ref 38–126)
Anion gap: 8 (ref 5–15)
BUN: 19 mg/dL (ref 8–23)
CO2: 26 mmol/L (ref 22–32)
Calcium: 8.5 mg/dL — ABNORMAL LOW (ref 8.9–10.3)
Chloride: 104 mmol/L (ref 98–111)
Creatinine, Ser: 0.97 mg/dL (ref 0.61–1.24)
GFR, Estimated: >60 mL/min (ref >60)
Glucose, Bld: 163 mg/dL — ABNORMAL HIGH (ref 70–99)
Potassium: 3.9 mmol/L (ref 3.5–5.1)
Sodium: 138 mmol/L (ref 135–145)
Total Bilirubin: 1.2 mg/dL (ref 0.3–1.2)
Total Protein: 5.4 g/dL — ABNORMAL LOW (ref 6.5–8.1)

## 2022-03-15 NOTE — Progress Notes (Signed)
PROGRESS NOTE   Subjective/Complaints:  No issues overnite, pain Right hip/buttocks , otherwise no c/os, thinks he may have BM this am , working on dressing with OT  ROS- neg CP, SOB,  N/V/D  Objective:   No results found. Recent Labs    03/13/22 0759 03/14/22 0639  WBC 10.5 12.2*  HGB 9.5* 8.8*  HCT 29.7* 27.2*  PLT 199 183   Recent Labs    03/13/22 0759 03/14/22 0417  NA 139 138  K 4.0 4.1  CL 104 103  CO2 27 25  GLUCOSE 132* 114*  BUN 21 22  CREATININE 0.95 0.91  CALCIUM 8.8* 8.4*    Intake/Output Summary (Last 24 hours) at 03/15/2022 0849 Last data filed at 03/15/2022 1062 Gross per 24 hour  Intake 370 ml  Output 150 ml  Net 220 ml        Physical Exam: Vital Signs Blood pressure (!) 128/91, pulse 68, temperature 98.1 F (36.7 C), temperature source Oral, resp. rate 18, height '6\' 2"'$  (1.88 m), weight 81.6 kg.  General: No acute distress Mood and affect are appropriate Heart: Regular rate and rhythm no rubs murmurs or extra sounds Lungs: Clear to auscultation, breathing unlabored, no rales or wheezes Abdomen: Positive bowel sounds, soft nontender to palpation, nondistended Extremities: No clubbing, cyanosis, or edema Skin: No evidence of breakdown, no evidence of rash Neurologic: Cranial nerves II through XII intact, motor strength is 5/5 in bilateral deltoid, bicep, tricep, grip, hip flexor, knee extensors, ankle dorsiflexor and plantar flexor Sensory exam normal sensation to light touch ain bilateral upper and lower extremities  Musculoskeletal: reduced R hip abd due to pain , large ecchymosis Right buttocks    Assessment/Plan: 1. Functional deficits which require 3+ hours per day of interdisciplinary therapy in a comprehensive inpatient rehab setting. Physiatrist is providing close team supervision and 24 hour management of active medical problems listed below. Physiatrist and rehab team  continue to assess barriers to discharge/monitor patient progress toward functional and medical goals  Care Tool:  Bathing              Bathing assist       Upper Body Dressing/Undressing Upper body dressing        Upper body assist      Lower Body Dressing/Undressing Lower body dressing            Lower body assist       Toileting Toileting    Toileting assist       Transfers Chair/bed transfer  Transfers assist           Locomotion Ambulation   Ambulation assist              Walk 10 feet activity   Assist           Walk 50 feet activity   Assist           Walk 150 feet activity   Assist           Walk 10 feet on uneven surface  activity   Assist           Wheelchair     Assist  Wheelchair 50 feet with 2 turns activity    Assist            Wheelchair 150 feet activity     Assist          Blood pressure (!) 128/91, pulse 68, temperature 98.1 F (36.7 C), temperature source Oral, resp. rate 18, height '6\' 2"'$  (1.88 m), weight 81.6 kg.  Medical Problem List and Plan: 1. Functional deficits secondary to large intramuscular hematoma right gluteus maximus muscle measuring at least 11 x 5.6 x 11.4 cm after fall             -patient may  shower             -ELOS/Goals: 7-14 days, with dispo home to independent living-               - Excellent dispo with independent living facility, has nursing aide and PT/OT services available as needed. Son (retired Scientist, clinical (histocompatibility and immunogenetics)) lives 20-30 min away and available for assistance.               - Ambulates with 4 wheeled walked Mod I at baseline; Ind ADLs 2.  Antithrombotics: -DVT/anticoagulation:  Pharmaceutical: Eliquis - resumed Monday 8/21; HgB downtrending gradually since              -antiplatelet therapy: N/A 3. Pain Management: Low-dose oxycodone as needed              - Takes Tylenol 1000 mg BID at baseline for chronic  back pain 4. Mood/Behavior/Sleep: Provide emotional support             -antipsychotic agents: N/A 5. Neuropsych/cognition: This patient is capable of making decisions on his own behalf. 6. Skin/Wound Care: Routine skin checks              - Currently foam dressings for wound prevention only; recommend floating heels in bed once admitted to [facility] for foot care as OP 7. Fluids/Electrolytes/Nutrition: Routine in and out with follow-up chemistry               - Diet advancing from full liquid 8/22 8.  Acute blood loss anemia related hematoma.                  - Follow-up CBC                - Monitor hematoma closely for s/s enlargement 9.  Atrial fibrillation as well as bradycardia.  Eliquis resumed.  Follow-up with EP cardiology team.  No current plans for pacer                - Irregular HR but stable rate, monitor 10.  History of prostate cancer with radioactive seed implant.  Follow-up outpatient                - Baseline urinary incontinent with pads at nighttime and briefs during daytime 11.  Macular degeneration.  Resume Ocuvite.                - Pt legally blind, sees "shadows" only beyond 12 in, but can   navigate hallways and performs ADLs independently at baseline   12. Hx cardioembolic stroke d/t Afib, no residual deficits    LOS: 1 days A FACE TO FACE EVALUATION WAS PERFORMED  Charlett Blake 03/15/2022, 8:49 AM

## 2022-03-15 NOTE — Progress Notes (Signed)
Patient up working with therapy. Patient requested PRN pain medications for right hip pian. Rates pain 9/10. PRN OXY given as ordered. Upon recheck patient rates pain 2 /3-10. Ice pack applied for comfort.

## 2022-03-15 NOTE — Progress Notes (Signed)
Inpatient Rehabilitation Care Coordinator Assessment and Plan Patient Details  Name: Justin Potter MRN: [id number] Date of Birth: April 28, 1926  Today's Date: 03/15/2022  Hospital Problems: Principal Problem:   Intramuscular hematoma  Past Medical History:  Past Medical History:  Diagnosis Date   Anxiety    Arthritis    "in the fingers" per pt   Atrial fibrillation ([place])    Bladder neck contracture    Dysrhythmia    A-FIB / HX BRADYCARDIA - FOLLOWED BY DR. Corydon DUE TO [name]   Gross hematuria    History of gout    History of prostate cancer    S/P RADIOACTIVE SEED IMPLANTS   History of squamous cell carcinoma of skin    EXCISION LOWER LIP AND RIGHT EAR   History of TIA (transient ischemic attack)    probable tia no residual  09-09-2012   Hyperlipidemia    Hypertension    Macular degeneration    both eyes   Urethral stricture    Urinary retention    Past Surgical History:  Past Surgical History:  Procedure Laterality Date   APPENDECTOMY     CATARACT EXTRACTION W/ INTRAOCULAR LENS  IMPLANT, BILATERAL     CHOLECYSTECTOMY  1977   CYSTOSCOPY WITH URETHRAL DILATATION N/A 02/12/2013   Procedure: CYSTOSCOPY WITH BALLOON DILATATION OF URETHRAL STRICTURE AND BLADDER FULGERATION;  Surgeon: Bernestine Amass, MD;  Location: [facility];  Service: Urology;  Laterality: N/A;   ESOPHAGOGASTRODUODENOSCOPY (EGD) WITH PROPOFOL N/A 11/02/2020   Procedure: ESOPHAGOGASTRODUODENOSCOPY (EGD) WITH PROPOFOL;  Surgeon: Clarene Essex, MD;  Location: [facility];  Service: Endoscopy;  Laterality: N/A;   LAPAROSCOPIC APPENDECTOMY N/A 08/22/2013   Procedure: APPENDECTOMY LAPAROSCOPIC;  Surgeon: Pedro Earls, MD;  Location: [facility];  Service: General;  Laterality: N/A;   [place] N/A 11/02/2020   Procedure: SAVORY DILATION;  Surgeon: Clarene Essex, MD;  Location: [facility];  Service: Endoscopy;  Laterality: N/A;   TONSILLECTOMY  as  child   TOTAL KNEE ARTHROPLASTY Right 11-23-2004   Social History:  reports that he quit smoking about 43 years ago. His smoking use included cigarettes. He has a 52.50 pack-year smoking history. He has never used smokeless tobacco. He reports current alcohol use. He reports that he does not use drugs.  Family / Support Systems Marital Status: Widow/Widower Patient Roles: Parent Children: Dr Arloa Koh  [phone] Other Supports: neighbors and church members Anticipated Caregiver: [place] Ability/Limitations of Caregiver: Son checks on him daily but does not live with him. Pt resides in [place] at [place] Availability: Intermittent Family Dynamics: Close with son who lives nearby and checks on Dad daily. Pt feels he does well at [facility] and has lived there for 12 years.  Social History Preferred language: English Religion: Catholic Cultural Background: No issues Education: Medical sales representative - How often do you need to have someone help you when you read instructions, pamphlets, or other written material from your doctor or pharmacy?: Often Writes: Yes Employment Status: Retired Public relations account executive Issues: No issues Guardian/Conservator: None-according to MD pt is capable of making his own decisions, but will keep son informed of any decisions while here   Abuse/Neglect Abuse/Neglect Assessment Can Be Completed: Yes Physical Abuse: Denies Verbal Abuse: Denies Sexual Abuse: Denies Exploitation of patient/patient's resources: Denies Self-Neglect: Denies  Patient response to: Social Isolation - How often do you feel lonely or isolated from those around you?: Never  Emotional Status Pt's affect, behavior and adjustment status: Pt is motivated to work on his balance and get back to his mod/i level. He uses a rollator and feels this helps with his balance and keeps him steady. He wears a life alert button from [place]. Recent Psychosocial  Issues: other health issues Psychiatric History: No history Substance Abuse History: No issues  Patient / Family Perceptions, Expectations & Goals Pt/Family understanding of illness & functional limitations: Pt can explain his fall and feels his balance has been effected. He does talk with the MD and feels his questions and concerns are being addressed. His son is also involved and talks with MD Premorbid pt/family roles/activities: Father, grandfather, retiree, neighbor, church member Anticipated changes in roles/activities/participation: resume Pt/family expectations/goals: Pt states: " I hope to get back to walkng better since my fall and back home."  [employer]: Other (Comment) (residen to [facility]dependent [facility]) Premorbid Home Care/DME Agencies: Other (Comment) (rollator) Transportation available at discharge: Son and facility Is the patient able to respond to transportation needs?: Yes In the past 12 months, has lack of transportation kept you from medical appointments or from getting medications?: No In the past 12 months, has lack of transportation kept you from meetings, work, or from getting things needed for daily living?: No  Discharge Planning Living Arrangements: Other (Comment) (ILF) Support Systems: Children, Friends/neighbors, Church/faith community Type of Residence: [place] Name: Other (enter name of facility below) [place] Name: [place] Resources: [employer], Multimedia programmer (specify) Nurse, mental health) Financial Resources: Radio broadcast assistant Screen Referred: No Living Expenses: Rent Money Management: Patient, Family Does the patient have any problems obtaining your medications?: No Home Management: Facility Patient/Family Preliminary Plans: Return back to [facility] independent living apartment. His son lives close by and checks on him daily. Will await therapy evaluations and work on  discharge needs. Care Coordinator Barriers to Discharge: Decreased caregiver support Care Coordinator Anticipated Follow Up Needs: HH/OP  Clinical Impression Pleasant gentleman who has a good sense of humor. He is motivated to do well here and return back to his independent living apartment at [facility]. Will work on discharge needs.  Elease Hashimoto 03/15/2022, 10:25 AM

## 2022-03-15 NOTE — Discharge Instructions (Addendum)
Inpatient Rehab Discharge Instructions  REMI LOPATA Discharge date and time: 03/27/2022  Activities/Precautions/ Functional Status: Activity: activity as tolerated and no lifting, driving, or strenuous exercise until cleared by MD Diet: regular diet Wound Care: Routine skin checks Functional status:  ___ No restrictions     ___ Walk up steps independently ___ 24/7 supervision/assistance   ___ Walk up steps with assistance __ x_ Intermittent supervision/assistance  ___ Bathe/dress independently ___ Walk with walker     _x__ Bathe/dress with assistance ___ Walk Independently    ___ Shower independently ___ Walk with assistance    ___ Shower with assistance _x__ No alcohol     ___ Return to work/school ________  Special Instructions: No driving smoking or alcohol   My questions have been answered and I understand these instructions. I will adhere to these goals and the provided educational materials after my discharge from the hospital.  Patient/Caregiver Signature _______________________________ Date __________  Clinician Signature _______________________________________ Date __________  Please bring this form and your medication list with you to all your follow-up doctor's appointments.

## 2022-03-15 NOTE — Progress Notes (Signed)
Physical Therapy Session Note  Patient Details  Name: Justin Potter MRN: [id number] Date of Birth: September 20, 1925  Today's Date: 03/15/2022 PT Individual Time: 8280-0349 PT Individual Time Calculation (min): 70 min   Short Term Goals: Week 1:  PT Short Term Goal 1 (Week 1): Pt will perform bed mobility with min assist PT Short Term Goal 2 (Week 1): Pt will ambulate 2f with min assist PT Short Term Goal 3 (Week 1): Pt will improve TUG to < 247m  Skilled Therapeutic Interventions/Progress Updates:   Pt received supine in bed and agreeable to PT. Supine>sit transfer with mod assist and cues for log roll to the L. Sitting balance EOB for RN to administer pain meds. Stand pivot [facility] R side with min assist from elevated bed height. WC mobility through hall x [id number] supervision assist from PT with cues for safety in doorway management and direction. .   PT instructed pt in TUG: 3.69m97maverage of 3 trials; >13.5 sec indicates increased fall risk)  Pt returned to room and performed stand pivot transfer to bed with RW and mod assist from PT for safety. Sit>supine completed with min-mod assist and increased time due to pain in the R hip.   Supine therex: SLR, hip adduction, hip abduction, ankle pumps, performed AROM on the LLE and AAROM on the RLE; x 10 each with therapeutic rest breaks due to pain in the R hip.    Pt  left supine in bed with call bell in reach and all needs met.       Therapy Documentation Precautions:  Precautions Precautions: Fall Restrictions Weight Bearing Restrictions: Yes RLE Weight Bearing: Weight bearing as tolerated Other Position/Activity Restrictions: legally blind  Pain:   8/10 R hip. See eMAR for pain medicine provided.   Therapy/Group: Individual Therapy  AustLorie Phenix3/2023, 4:06 PM

## 2022-03-15 NOTE — Evaluation (Signed)
Occupational Therapy Assessment and Plan  Patient Details  Name: Justin Potter MRN: [id number] Date of Birth: 1925/09/29  OT Diagnosis: abnormal posture, acute pain, blindness and low vision, pain in joint, and swelling of limb Rehab Potential: Rehab Potential (ACUTE ONLY): Good ELOS: 2 weeks   Today's Date: 03/15/2022 OT Individual Time: 5784-6962 OT Individual Time Calculation (min): 56 min     Hospital Problem: Principal Problem:   Intramuscular hematoma   Past Medical History:  Past Medical History:  Diagnosis Date   Anxiety    Arthritis    "in the fingers" per pt   Atrial fibrillation ([place])    Bladder neck contracture    Dysrhythmia    A-FIB / HX BRADYCARDIA - FOLLOWED BY DR. Ravenden DUE TO [name]   Gross hematuria    History of gout    History of prostate cancer    S/P RADIOACTIVE SEED IMPLANTS   History of squamous cell carcinoma of skin    EXCISION LOWER LIP AND RIGHT EAR   History of TIA (transient ischemic attack)    probable tia no residual  09-09-2012   Hyperlipidemia    Hypertension    Macular degeneration    both eyes   Urethral stricture    Urinary retention    Past Surgical History:  Past Surgical History:  Procedure Laterality Date   APPENDECTOMY     CATARACT EXTRACTION W/ INTRAOCULAR LENS  IMPLANT, BILATERAL     CHOLECYSTECTOMY  1977   CYSTOSCOPY WITH URETHRAL DILATATION N/A 02/12/2013   Procedure: CYSTOSCOPY WITH BALLOON DILATATION OF URETHRAL STRICTURE AND BLADDER FULGERATION;  Surgeon: Bernestine Amass, MD;  Location: [facility];  Service: Urology;  Laterality: N/A;   ESOPHAGOGASTRODUODENOSCOPY (EGD) WITH PROPOFOL N/A 11/02/2020   Procedure: ESOPHAGOGASTRODUODENOSCOPY (EGD) WITH PROPOFOL;  Surgeon: Clarene Essex, MD;  Location: [facility];  Service: Endoscopy;  Laterality: N/A;   LAPAROSCOPIC APPENDECTOMY N/A 08/22/2013   Procedure: APPENDECTOMY LAPAROSCOPIC;  Surgeon: Pedro Earls, MD;  Location: [facility];  Service: General;   Laterality: N/A;   [place] N/A 11/02/2020   Procedure: SAVORY DILATION;  Surgeon: Clarene Essex, MD;  Location: [facility];  Service: Endoscopy;  Laterality: N/A;   TONSILLECTOMY  as child   TOTAL KNEE ARTHROPLASTY Right 11-23-2004    Assessment & Plan Clinical Impression:   Justin Potter is a 88 year old right-handed male with history of prostate cancer with radioactive seed implant, atrial fibrillation on Eliquis, persistent bradycardia, TIA, macular degeneration, hypertension, hyperlipidemia, BPH/urinary retention.  Per chart review patient resides [facility] facility.  1 level apartment.  Ambulates independently with a rollator.  Presented 03/12/2022 with increasing right hip pain after a fall 6 days ago as well as hypotensive and bradycardic.  On 03/09/2022 he went to [facility] had a CT of hip done which showed some swelling and no fracture but noted some soft tissue edema and some ill-defined fluid within the subcutaneous tissue overlying the posterior lateral aspect of the right hip.  He was given no pain medications at that time and returned home.  Patient noted increasing pain to her right hip.  CT of right hip follow-up showed large intramuscular hematoma in the right gluteus maximus muscle measuring at least 11 x 5.6 11.4 cm.  Soft tissue contusion.  No acute osseous injury of the right hip.  Admission chemistries unremarkable except BUN 24, hemoglobin 11.1, WBC 11,600.  He did receive consultation follow-up  by Dr. Alvan Dame with conservative care.  His Eliquis was initially held due to hematoma and has since been resumed.  He did receive followed by cardiology service for history of junctional bradycardia with heart rate approximately 50 bpm and patient continued to be followed by EP currently no plan for pacer.  Hospital course anemia 8.8 monitored.  Therapy evaluations completed due to patient decreased functional mobility was  admitted for a comprehensive rehab program. Patient transferred to CIR on 03/14/2022 .    Patient currently requires  mod-max A  with basic self-care skills secondary to muscle weakness, decreased cardiorespiratoy endurance, decreased coordination, visual blindness, and decreased standing balance and decreased balance strategies.  Prior to hospitalization, patient could complete all self-care independently.  Patient will benefit from skilled intervention to decrease level of assist with basic self-care skills and increase independence with basic self-care skills prior to discharge  to ILF .  Anticipate patient will require 24 hour supervision and follow up home health.  OT - End of Session Activity Tolerance: Tolerates < 10 min activity, no significant change in vital signs Endurance Deficit: Yes Endurance Deficit Description: unable to maintain stance longer than 1 min during ADLs OT Assessment Rehab Potential (ACUTE ONLY): Good OT Barriers to Discharge: Incontinence;Other (comments) OT Barriers to Discharge Comments: blindness OT Patient demonstrates impairments in the following area(s): Balance;Endurance;Pain;Safety;Vision;Motor OT Basic ADL's Functional Problem(s): Bathing;Dressing;Toileting OT Transfers Functional Problem(s): Toilet;Tub/Shower OT Additional Impairment(s): None OT Plan OT Intensity: Minimum of 1-2 x/day, 45 to 90 minutes OT Frequency: 5 out of 7 days OT Duration/Estimated Length of Stay: 2 weeks OT Treatment/Interventions: Balance/vestibular training;Discharge planning;Pain management;Self Care/advanced ADL retraining;Therapeutic Activities;UE/LE Coordination activities;Cognitive remediation/compensation;Disease mangement/prevention;Functional mobility training;Patient/family education;Therapeutic Exercise;Visual/perceptual remediation/compensation;UE/LE Strength taining/ROM;DME/adaptive equipment instruction OT Self Feeding Anticipated Outcome(s): Set up A OT Basic  Self-Care Anticipated Outcome(s): Supervision OT Toileting Anticipated Outcome(s): Mod I OT Bathroom Transfers Anticipated Outcome(s): Mod I OT Recommendation Recommendations for Other Services: Therapeutic Recreation consult Therapeutic Recreation Interventions: Pet therapy Patient destination: Assisted Living Follow Up Recommendations: Home health OT Equipment Recommended: To be determined   OT Evaluation Precautions/Restrictions  Precautions Precautions: Fall Restrictions Weight Bearing Restrictions: No RLE Weight Bearing: Weight bearing as tolerated Other Position/Activity Restrictions: legally blind Home Living/Prior Functioning Home Living Family/patient expects to be discharged to:: Other (Comment) Living Arrangements: Alone Available Help at Discharge: Family, Other (Comment) (lives at [facility], son involved in his care) Type of Home: Apartment Home Access: Level entry Home Layout: One level Bathroom Shower/Tub: Gaffer, Curtain Bathroom Toilet: Handicapped height Bathroom Accessibility: Yes Additional Comments: [facility] independent [facility], has life alert button as well as pull cords in rooms. Participates in daily exercise classes there  Lives With: Alone IADL History Homemaking Responsibilities: No Current License: No Prior Function Level of Independence: Requires assistive device for independence, Independent with basic ADLs  Able to Take Stairs?: No Driving: No Vision Baseline Vision/History: 1 Wears glasses;2 Legally blind Ability to See in Adequate Light: 3 Highly impaired Patient Visual Report: No change from baseline Vision Assessment?: No apparent visual deficits Additional Comments: Has progressive macular degeneration; can see outlines however mostly splotty/blurred vision with inability to see details Perception  Perception: Impaired Praxis Praxis: Intact Cognition Cognition Overall Cognitive Status: Within Functional Limits for  tasks assessed Arousal/Alertness: Awake/alert Orientation Level: Person;Place;Situation Person: Oriented Place: Oriented Situation: Oriented Memory: Appears intact Awareness: Appears intact Problem Solving: Impaired Safety/Judgment: Appears intact Brief Interview for Mental Status (BIMS) Repetition of Three Words (First Attempt): 3 Temporal Orientation: Year: Correct Temporal Orientation:  Month: Accurate within 5 days Temporal Orientation: Day: Correct Recall: "Sock": Yes, no cue required Recall: "Blue": Yes, no cue required Recall: "Bed": No, could not recall BIMS Summary Score: 13 Sensation Sensation Light Touch: Appears Intact Hot/Cold: Appears Intact Proprioception: Appears Intact Stereognosis: Not tested Coordination Gross Motor Movements are Fluid and Coordinated: No Fine Motor Movements are Fluid and Coordinated: No Coordination and Movement Description: posterior bias, generalized weakness, pain Finger Nose Finger Test: difficult to assess 2/2 vision impairment Motor  Motor Motor: Other (comment) Motor - Skilled Clinical Observations: generalized deconditaitoning.  Trunk/Postural Assessment  Cervical Assessment Cervical Assessment: Exceptions to [facility] (forward head) Thoracic Assessment Thoracic Assessment: Exceptions to [facility] (rounded shoulders) Lumbar Assessment Lumbar Assessment: Exceptions to [facility] (posterior pelvic tilt in sitting) Postural Control Postural Control: Deficits on evaluation  Balance Balance Balance Assessed: Yes Dynamic Sitting Balance Dynamic Sitting - Balance Support: Feet supported;Left upper extremity supported Dynamic Sitting - Level of Assistance: 5: Stand by assistance Dynamic Sitting - Balance Activities: Reaching for objects Static Standing Balance Static Standing - Balance Support: During functional activity;Bilateral upper extremity supported Static Standing - Level of Assistance: 4: Min assist Dynamic Standing Balance Dynamic  Standing - Balance Support: During functional activity;Bilateral upper extremity supported Dynamic Standing - Level of Assistance: 3: Mod assist Extremity/Trunk Assessment RUE Assessment RUE Assessment: Exceptions to [facility] Active Range of Motion (AROM) Comments: WFL, hand AROM limited 2/2 arthritic changes General Strength Comments: 4-/5, 3-/5 hand grip LUE Assessment LUE Assessment: Exceptions to [facility] Active Range of Motion (AROM) Comments: WFL, hand AROM limited 2/2 arthritic changes General Strength Comments: 4-/5 grossly, 3-/5 hand grasp  Care Tool Care Tool Self Care Eating   Eating Assist Level: Set up assist    Oral Care    Oral Care Assist Level: Set up assist    Bathing   Body parts bathed by patient: Right arm;Left arm;Abdomen;Chest;Right upper leg;Left upper leg;Face Body parts bathed by helper: Right lower leg;Left lower leg;Front perineal area   Assist Level: Moderate Assistance - Patient 50 - 74%    Upper Body Dressing(including orthotics)   What is the patient wearing?: Pull over shirt   Assist Level: Minimal Assistance - Patient > 75%    Lower Body Dressing (excluding footwear)   What is the patient wearing?: Pants Assist for lower body dressing: Maximal Assistance - Patient 25 - 49%    Putting on/Taking off footwear   What is the patient wearing?: Non-skid slipper socks Assist for footwear: Dependent - Patient 0%       Care Tool Toileting Toileting activity   Assist for toileting: Maximal Assistance - Patient 25 - 49%     Care Tool Bed Mobility Roll left and right activity        Sit to lying activity        Lying to sitting on side of bed activity   Lying to sitting on side of bed assist level: the ability to move from lying on the back to sitting on the side of the bed with no back support.: Moderate Assistance - Patient 50 - 74%     Care Tool Transfers Sit to stand transfer   Sit to stand assist level: Minimal Assistance - Patient > 75%     Chair/bed transfer         Toilet transfer   Assist Level: Moderate Assistance - Patient 50 - 74%     Care Tool Cognition  Expression of Ideas and Wants Expression of Ideas and Wants: 4. Without  difficulty (complex and basic) - expresses complex messages without difficulty and with speech that is clear and easy to understand  Understanding Verbal and Non-Verbal Content Understanding Verbal and Non-Verbal Content: 3. Usually understands - understands most conversations, but misses some part/intent of message. Requires cues at times to understand   Memory/Recall Ability Memory/Recall Ability : Current season;That he or she is in a hospital/hospital unit   Refer to Care Plan for [place] 1 OT Short Term Goal 1 (Week 1): Pt will complete 1/3 toileting steps with no more than min A for balance OT Short Term Goal 2 (Week 1): Pt will using AE PRN to dress LB with min A OT Short Term Goal 3 (Week 1): Pt will complete toilet transfer with CGA using LRAD  Recommendations for other services: Therapeutic Recreation  Pet therapy and Stress management   Skilled Therapeutic Intervention Skilled OT intervention completed with discussion on POC, rehab goals and explanation of OT purpose. Pt received supine in bed, agreeable to session. Pt is AxOx4 and indicated unrated pain with nurse made aware and in room to administer meds. Pt transitioned from upright in bed to EOB with Mod A for RLE and trunk elevation with HHA. Able to maintain static and dynamic sitting balance with supervision. Completed bathing/dressing at EOB. See caretool for further details on assist level with self-care tasks performed. Completed min A sit > stand and mod A stand pivot using RW to recliner. Posterior bias noted in stance with cues needed for RW positioning and for RLE. Pt remained upright in recliner, with chair pad alarm on and all needs in reach at end of session.   ADL ADL Eating: Set  up Where Assessed-Eating: Bed level Grooming: Setup Where Assessed-Grooming: Wheelchair Upper Body Bathing: Supervision/safety Where Assessed-Upper Body Bathing: Edge of bed Lower Body Bathing: Moderate assistance Where Assessed-Lower Body Bathing: Edge of bed Upper Body Dressing: Minimal assistance Where Assessed-Upper Body Dressing: Edge of bed Lower Body Dressing: Maximal assistance Where Assessed-Lower Body Dressing: Edge of bed Toileting: Maximal assistance Where Assessed-Toileting: Other (Comment) (simulated at EOB) Toilet Transfer: Moderate assistance Toilet Transfer Method: Stand pivot Science writer: Other (comment) (simulated to recliner) Tub/Shower Transfer: Unable to assess Tub/Shower Transfer Method: Unable to assess Social research officer, government: Unable to assess Social research officer, government Method: Unable to assess Mobility  Bed Mobility Bed Mobility: Supine to Sit Supine to Sit: Moderate Assistance - Patient 50-74% Transfers Sit to Stand: Minimal Assistance - Patient > 75%   Discharge Criteria: Patient will be discharged from OT if patient refuses treatment 3 consecutive times without medical reason, if treatment goals not met, if there is a change in medical status, if patient makes no progress towards goals or if patient is discharged from hospital.  The above assessment, treatment plan, treatment alternatives and goals were discussed and mutually agreed upon: by patient  Blase Mess, MS, OTR/L  03/15/2022, 12:46 PM

## 2022-03-15 NOTE — Patient Care Conference (Signed)
Inpatient RehabilitationTeam Conference and Plan of Care Update Date: 03/15/2022   Time: 10:12 AM    Patient Name: Justin Potter      Medical Record Number: [id number]  Date of Birth: September 10, 1925 Sex: Male         Room/Bed: 4M09C/4M09C-01 Payor Info: Payor: MEDICARE / Plan: MEDICARE PART A AND B / Product Type: *No Product type* /    Admit Date/Time:  03/14/2022  3:25 PM  Primary Diagnosis:  Intramuscular hematoma  Hospital Problems: Principal Problem:   Intramuscular hematoma    Expected Discharge Date: Expected Discharge Date:  (evals pending)  Team Members Present: Physician leading conference: Dr. Alysia Penna Social Worker Present: Loralee Pacas, [place] Nurse Present: Dorien Chihuahua, RN PT Present: Barrie Folk, PT OT Present: Willeen Cass, OT PPS Coordinator present : Gunnar Fusi, SLP     Current Status/Progress Goal Weekly Team Focus  Bowel/Bladder     Patient is incontinent of bladder; hx of retention, constipation addressed   Continent of bowel and bladder   Toileting, laxatives  Swallow/Nutrition/ Hydration             ADL's   evals pending         Mobility   Eval pending  Eval pending      Communication             Safety/Cognition/ Behavioral Observations    Evals pending; No SLP        Pain     Right hip pain   Pain managed < 4 with prns   Assess need for and effectiveness of pain meds  Skin     Bruising to hip   Bruise resolving   [place] skin daily    Discharge Planning:  Plans to return to [facility] where he has been for the past 12 years. he was using a rollator prior to admission and has a life alert he wears   Team Discussion: Patient  with anemia; MD monitoring Hgb. Mac Degen; legally blind.  Right hip pain.  Patient on target to meet rehab goals: Evals pending; currently min assist for UB care and mod - max for lower body care. Requires mod assist for bed mobility and min assist for transfers with a posterior bias.    *See Care Plan and progress notes for long and short-term goals.   Revisions to Treatment Plan:  N/a  Teaching Needs: Safety, medications, transfers, toileting, etc  Current Barriers to Discharge: Decreased caregiver support; ILF with life alert   Possible Resolutions to Barriers: Family education     Medical Summary Current Status: Patient with large right buttocks hematoma following fall while on anticoagulation.  Anticoagulation resumed, patient with acute blood loss anemia currently being monitored.  Barriers to Discharge: Medical stability   Possible Resolutions to Barriers/Weekly Focus: Monitor hemoglobin, monitor heart rate and blood pressure during exercise   Continued Need for Acute Rehabilitation Level of Care: The patient requires daily medical management by a physician with specialized training in physical medicine and rehabilitation for the following reasons: Direction of a multidisciplinary physical rehabilitation program to maximize functional independence : Yes Medical management of patient stability for increased activity during participation in an intensive rehabilitation regime.: Yes Analysis of laboratory values and/or radiology reports with any subsequent need for medication adjustment and/or medical intervention. : Yes   I attest that I was present, lead the team conference, and concur with the assessment and plan of the team.   Dorien Chihuahua B 03/15/2022, 4:27  PM

## 2022-03-15 NOTE — Progress Notes (Signed)
[facility] Individual Statement of Services  Patient Name:  Justin Potter  Date:  03/15/2022  Welcome to the [facility].  Our goal is to provide you with an individualized program based on your diagnosis and situation, designed to meet your specific needs.  With this comprehensive rehabilitation program, you will be expected to participate in at least 3 hours of rehabilitation therapies Monday-Friday, with modified therapy programming on the weekends.  Your rehabilitation program will include the following services:  Physical Therapy (PT), Occupational Therapy (OT), 24 hour per day rehabilitation nursing, Therapeutic Recreaction (TR), Care Coordinator, Rehabilitation Medicine, Nutrition Services, and Pharmacy Services  Weekly team conferences will be held on Wednesday to discuss your progress.  Your Inpatient Rehabilitation Care Coordinator will talk with you frequently to get your input and to update you on team discussions.  Team conferences with you and your family in attendance may also be held.  Expected length of stay: 14-16 days  Overall anticipated outcome: supervision-mod/I level  Depending on your progress and recovery, your program may change. Your Inpatient Rehabilitation Care Coordinator will coordinate services and will keep you informed of any changes. Your Inpatient Rehabilitation Care Coordinator's name and contact numbers are listed  below.  The following services may also be recommended but are not provided by the [facility]:   [place] will be made to provide these services after discharge if needed.  Arrangements include referral to agencies that provide these services.  Your insurance has been verified to be:  [place] Your primary doctor is:  [name]  Pertinent information will be shared with your doctor and your  insurance company.  Inpatient Rehabilitation Care Coordinator:  Erlene Quan, [place] or [phone]  Information discussed with and copy given to patient by: Elease Hashimoto, 03/15/2022, 10:26 AM

## 2022-03-15 NOTE — Evaluation (Signed)
Physical Therapy Assessment and Plan  Patient Details  Name: Justin Potter MRN: [id number] Date of Birth: 1925-11-17  PT Diagnosis: {diagnoses:3041673} Rehab Potential:   ELOS:     Today's Date: 03/15/2022 PT Individual Time: 9163-8466 PT Individual Time Calculation (min): 58 min    Hospital Problem: Principal Problem:   Intramuscular hematoma   Past Medical History:  Past Medical History:  Diagnosis Date   Anxiety    Arthritis    "in the fingers" per pt   Atrial fibrillation ([place])    Bladder neck contracture    Dysrhythmia    A-FIB / HX BRADYCARDIA - FOLLOWED BY DR. [place] DUE TO [name]   Gross hematuria    History of gout    History of prostate cancer    S/P RADIOACTIVE SEED IMPLANTS   History of squamous cell carcinoma of skin    EXCISION LOWER LIP AND RIGHT EAR   History of TIA (transient ischemic attack)    probable tia no residual  09-09-2012   Hyperlipidemia    Hypertension    Macular degeneration    both eyes   Urethral stricture    Urinary retention    Past Surgical History:  Past Surgical History:  Procedure Laterality Date   APPENDECTOMY     CATARACT EXTRACTION W/ INTRAOCULAR LENS  IMPLANT, BILATERAL     CHOLECYSTECTOMY  1977   CYSTOSCOPY WITH URETHRAL DILATATION N/A 02/12/2013   Procedure: CYSTOSCOPY WITH BALLOON DILATATION OF URETHRAL STRICTURE AND BLADDER FULGERATION;  Surgeon: Bernestine Amass, MD;  Location: [facility];  Service: Urology;  Laterality: N/A;   ESOPHAGOGASTRODUODENOSCOPY (EGD) WITH PROPOFOL N/A 11/02/2020   Procedure: ESOPHAGOGASTRODUODENOSCOPY (EGD) WITH PROPOFOL;  Surgeon: Clarene Essex, MD;  Location: [facility];  Service: Endoscopy;  Laterality: N/A;   LAPAROSCOPIC APPENDECTOMY N/A 08/22/2013   Procedure: APPENDECTOMY LAPAROSCOPIC;  Surgeon: Pedro Earls, MD;  Location: [facility];  Service: General;  Laterality: N/A;   [place] N/A 11/02/2020   Procedure: SAVORY  DILATION;  Surgeon: Clarene Essex, MD;  Location: [facility];  Service: Endoscopy;  Laterality: N/A;   TONSILLECTOMY  as child   TOTAL KNEE ARTHROPLASTY Right 11-23-2004    Assessment & Plan Clinical Impression: Patient is a 88 year old right-handed male with history of prostate cancer with radioactive seed implant, atrial fibrillation on Eliquis, persistent bradycardia, TIA, macular degeneration, hypertension, hyperlipidemia, BPH/urinary retention.  Per chart review patient resides [facility] assisted living facility.  1 level apartment.  Ambulates independently with a rollator.  Presented 03/12/2022 with increasing right hip pain after a fall 6 days ago as well as hypotensive and bradycardic.  On 03/09/2022 he went to [facility] had a CT of hip done which showed some swelling and no fracture but noted some soft tissue edema and some ill-defined fluid within the subcutaneous tissue overlying the posterior lateral aspect of the right hip.  He was given no pain medications at that time and returned home.  Patient noted increasing pain to her right hip.  CT of right hip follow-up showed large intramuscular hematoma in the right gluteus maximus muscle measuring at least 11 x 5.6 11.4 cm.  Soft tissue contusion.  No acute osseous injury of the right hip.  Admission chemistries unremarkable except BUN 24, hemoglobin 11.1, WBC 11,600.  He did receive consultation follow-up by Dr. Alvan Dame with conservative care.  His Eliquis was initially held due to hematoma and has since been resumed.  He  did receive followed by cardiology service for history of junctional bradycardia with heart rate approximately 50 bpm and patient continued to be followed by EP currently no plan for pacer.  Hospital course anemia 8.8 monitored.  Therapy evaluations completed due to patient decreased functional mobility was admitted for a comprehensive rehab program.  Patient transferred to CIR on 03/14/2022 .   Patient currently requires  {NIO:2703500} with mobility secondary to {impairments:3041632}.  Prior to hospitalization, patient was {XFG:1829937} with mobility and lived with Alone in a Independent living facility home.  Home access is  Level entry.  Patient will benefit from skilled PT intervention to {benefits:22816} for planned discharge {planned discharge:3041670}.  Anticipate patient will {follow JI:9678938} at discharge.  PT - End of Session Endurance Deficit: Yes Endurance Deficit Description: unable to maintain stance longer than 1 min during ADLs   PT Evaluation Precautions/Restrictions   General   Vital Signs Pain Pain Assessment Pain Score: 2  Pain Interference   Home Living/Prior Functioning Home Living Living Arrangements: Other (Comment) (ILF) Available Help at Discharge: Family;Other (Comment) (lives at [facility], son involved in his care) Type of Home: Independent living facility Home Access: Level entry Home Layout: One level Bathroom Shower/Tub: Walk-in Sales promotion account executive: Handicapped height Bathroom Accessibility: Yes Additional Comments: [facility] independent living, has life alert button as well as pull cords in rooms. has 4 and 3 wheeled rollators  Participates in daily exercise classes there  Lives With: Alone Prior Function Level of Independence: Requires assistive device for independence;Independent with basic ADLs  Able to Take Stairs?: No Driving: No Vision/Perception  Vision - History Ability to See in Adequate Light: 3 Highly impaired Vision - Assessment Additional Comments: macular degeneration at baseline. abve to see shapes and outlines without issue Perception Perception: Within Functional Limits Praxis Praxis: Intact  Cognition   Sensation Sensation Light Touch: Impaired Detail Peripheral sensation comments: mild decreaesd appreciation to light touch on the distal RLE and at site of hematoma Hot/Cold: Appears Intact Proprioception: Appears  Intact Coordination Gross Motor Movements are Fluid and Coordinated: No Fine Motor Movements are Fluid and Coordinated: No Coordination and Movement Description: limited by pain on the RLE Heel Shin Test: limited ROM on teh RLE due to pain Motor  Motor Motor: Other (comment) Motor - Skilled Clinical Observations: generalized deconditaitoning.   Trunk/Postural Assessment  Cervical Assessment Cervical Assessment: Exceptions to [facility] (forward head) Thoracic Assessment Thoracic Assessment: Exceptions to [facility] (rounded shoulders) Lumbar Assessment Lumbar Assessment: Exceptions to [facility] (posterior pelvic tilt in sitting) Postural Control Postural Control: Deficits on evaluation  Balance Balance Balance Assessed: Yes Dynamic Sitting Balance Dynamic Sitting - Balance Support: Feet supported;Left upper extremity supported Dynamic Sitting - Level of Assistance: 5: Stand by assistance Dynamic Sitting - Balance Activities: Reaching for objects Static Standing Balance Static Standing - Balance Support: During functional activity;Bilateral upper extremity supported Static Standing - Level of Assistance: 4: Min assist Dynamic Standing Balance Dynamic Standing - Balance Support: During functional activity;Bilateral upper extremity supported Dynamic Standing - Level of Assistance: 3: Mod assist Extremity Assessment           Care Tool Care Tool Bed Mobility Roll left and right activity        Sit to lying activity        Lying to sitting on side of bed activity   Lying to sitting on side of bed assist level: the ability to move from lying on the back to sitting on the side of the bed with no  back support.: Moderate Assistance - Patient 50 - 74%     Care Tool Transfers Sit to stand transfer   Sit to stand assist level: Minimal Assistance - Patient > 75%    Chair/bed transfer         Toilet transfer   Assist Level: Moderate Assistance - Patient 50 - 74%    Car transfer           Care Tool Locomotion Ambulation          Walk 10 feet activity         Walk 50 feet with 2 turns activity        Walk 150 feet activity        Walk 10 feet on uneven surfaces activity        Stairs          Walk up/down 1 step activity        Walk up/down 4 steps activity        Walk up/down 12 steps activity        Pick up small objects from floor        Wheelchair            Wheel 50 feet with 2 turns activity      Wheel 150 feet activity        Refer to Care Plan for Long Term Goals  SHORT TERM GOAL WEEK 1    Recommendations for other services: {RECOMMENDATIONS FOR OTHER SERVICES:3049016}  Skilled Therapeutic Intervention Mobility Bed Mobility Bed Mobility: Supine to Sit Supine to Sit: Moderate Assistance - Patient 50-74% Transfers Transfers: Sit to Stand;Stand Pivot Transfers Sit to Stand: Minimal Assistance - Patient > 75% Stand Pivot Transfers: Moderate Assistance - Patient 50 - 74% Stand Pivot Transfer Details: Verbal cues for technique;Verbal cues for safe use of DME/AE Transfer (Assistive device): Rolling walker Locomotion      Discharge Criteria: Patient will be discharged from PT if patient refuses treatment 3 consecutive times without medical reason, if treatment goals not met, if there is a change in medical status, if patient makes no progress towards goals or if patient is discharged from hospital.  The above assessment, treatment plan, treatment alternatives and goals were discussed and mutually agreed upon: {Assessment/Treatment Plan Discussed/Agreed:3049017}  Lorie Phenix 03/15/2022, 12:59 PM

## 2022-03-15 NOTE — Progress Notes (Signed)
Inpatient Rehabilitation  Patient information reviewed and entered into eRehab system by Josiah Nieto M. Thera Basden, M.A., CCC/SLP, PPS Coordinator.  Information including medical coding, functional ability and quality indicators will be reviewed and updated through discharge.    

## 2022-03-16 DIAGNOSIS — T148XXA Other injury of unspecified body region, initial encounter: Secondary | ICD-10-CM | POA: Diagnosis not present

## 2022-03-16 LAB — CBC
HCT: 25.2 % — ABNORMAL LOW (ref 39.0–52.0)
Hemoglobin: 8.3 g/dL — ABNORMAL LOW (ref 13.0–17.0)
MCH: 30.9 pg (ref 26.0–34.0)
MCHC: 32.9 g/dL (ref 30.0–36.0)
MCV: 93.7 fL (ref 80.0–100.0)
Platelets: 240 10*3/uL (ref 150–400)
RBC: 2.69 MIL/uL — ABNORMAL LOW (ref 4.22–5.81)
RDW: 13.8 % (ref 11.5–15.5)
WBC: 11.1 10*3/uL — ABNORMAL HIGH (ref 4.0–10.5)
nRBC: 0 % (ref 0.0–0.2)

## 2022-03-16 NOTE — Progress Notes (Signed)
Physical Therapy Session Note  Patient Details  Name: Justin Potter MRN: [id number] Date of Birth: 07-08-1926  Today's Date: 03/16/2022 PT Individual Time: 1035-1100 7342-8768 PT Individual Time Calculation (min): 25 min and 43 min   Short Term Goals: Week 1:  PT Short Term Goal 1 (Week 1): Pt will perform bed mobility with min assist PT Short Term Goal 2 (Week 1): Pt will ambulate 51f with min assist PT Short Term Goal 3 (Week 1): Pt will improve TUG to < 283m Week 2:    Week 3:     Skilled Therapeutic Interventions/Progress Updates:  Session 1.   Pt received supine in bed and agreeable to PT. PT assisted pt to don Teds while supine in bed with total A. Supine>sit transfer with mod assist for RLE management assist. Sitting balance EOB x 2 minutes. Stand pivot [facility] from elevated bed height. Pt reports need for urination. Sit<>[facility] for safety to perform standing urination into urinal controlled by PT; min assist for balance with reports of increasing pain on the RLE with increased time in standing; pt does not rate. [facility] and all needs met   Session 2.   Pt received sitting in WC and agreeable to PT. PT obtained bariatric RW. Stand pivot transfer to bed with min assist for safety into standing and cues for AD management in turn. Sit>pivot into bed with min assist and increased time due to pain in the RLE. Supine therex: SLR 2 x 10 AAROM on the RLE. Hip abduction 2 x 8 AAROM on the RLE. Hip/knee flexion within available range x 10 BLE. Ankle pumps 2 x 15 BLE. Pt left supine in bed with call bell in reach. Pain reported by pt with movement on RLE throughout session but does not rate.      Therapy Documentation Precautions:  Precautions Precautions: Fall Restrictions Weight Bearing Restrictions: Yes RLE Weight Bearing: Weight bearing as tolerated Other Position/Activity Restrictions: legally  blind  Vital Signs: Therapy Vitals Temp: 97.6 F (36.4 C) Temp Source: Oral Pulse Rate: 62 Resp: (!) 22 BP: 135/66 Patient Position (if appropriate): Sitting Oxygen Therapy SpO2: 100 % O2 Device: Room Air Pain: Pain Assessment Pain Scale: 0-10 Pain Score: 1  Faces Pain Scale: No hurt Pain Type: Acute pain Pain Location: Buttocks Pain Orientation: Right Pain Descriptors / Indicators: Aching Pain Frequency: Intermittent Pain Onset: On-going Pain Intervention(s): Emotional support;Distraction Multiple Pain Sites: No PAINAD (Pain Assessment in Advanced Dementia) Breathing: normal Negative Vocalization: none Body Language: relaxed Consolability: no need to console   Therapy/Group: Individual Therapy  AuLorie Phenix/24/2023, 5:00 PM

## 2022-03-16 NOTE — Progress Notes (Signed)
Occupational Therapy Session Note  Patient Details  Name: Justin Potter MRN: [id number] Date of Birth: [birth year]  Today's Date: 03/16/2022 OT Individual Time: 9983-3825 OT Individual Time Calculation (min): 64 min    Short Term Goals: Week 1:  OT Short Term Goal 1 (Week 1): Pt will complete 1/3 toileting steps with no more than min A for balance OT Short Term Goal 2 (Week 1): Pt will using AE PRN to dress LB with min A OT Short Term Goal 3 (Week 1): Pt will complete toilet transfer with CGA using LRAD  Skilled Therapeutic Interventions/Progress Updates:  Pt awake sitting in the w/c upon OT arrival to the room. Pt reports, "I have the sharpest pain whenever I stand." Pt in agreement for OT session.  Therapy Documentation Precautions:  Precautions Precautions: Fall Restrictions Weight Bearing Restrictions: Yes RLE Weight Bearing: Weight bearing as tolerated Other Position/Activity Restrictions: legally blind Vital Signs: Please see "Flowsheet" for most recent vitals charted by nursing staff.  Pain: Pain Assessment Pain Scale: 0-10 Pain Score: 2  Pain Type: Acute pain Pain Location: Buttocks Pain Orientation: Right Pain Descriptors / Indicators: Aching Pain Frequency: Intermittent Pain Onset: On-going Pain Intervention(s): Emotional support;Distraction Multiple Pain Sites: No  ADL: Pt declines need to perform ADL's at this time.   Functional Endurance: Pt participates in BUE endurance and strength activity in order to improve functional endurance skills needed to perform mobility & ADL's with improved independence. Pt able to participate in forward cycling on SciFit on level 1 for the following trials while seated in the w/c: -Trial 1: 3 mins, 19 secs -Trial 2: 3 mins, 45 secs - Trial 3: 3 mins, 47 secs  Pt encouraged to perform trials as long as pt is able and to stop when experiencing fatigue. Pt requires stopping on trials due to increased discomfort in back on B  sides. Pt requires a seated rest breaks in the w/c in between trials.   Cognitive Comp Techniques: Education provided to pt on techniques to utilize cell phone reminders to assist with safety with medication management, remembering appointments, etc. Pt reports that pt's son puts in reminders in pt's phone to assist with reminders and pt reports no concerns with medication management. Pt has a watch that will tell pt the time with auditory input.   Gathering Information: Pt participates in social conversation to gather information for pt's baseline. Pt reports frequently going to the dining hall, joining in on exercise lead by therapist, going on outings, etc. Pt reports sitting on the porch to talk to neighbors throughout the day. Pt reports having assistance with meal prep, cleaning, and laundry tasks.   Therapy/Group: Individual Therapy  Barbee Shropshire 03/16/2022, 4:23 PM

## 2022-03-16 NOTE — IPOC Note (Signed)
Overall Plan of Care [facility]) Patient Details Name: PIPER ALBRO MRN: [id number] DOB: 10-Feb-1926  Admitting Diagnosis: Intramuscular hematoma  Hospital Problems: Principal Problem:   Intramuscular hematoma     Functional Problem List: Nursing Pain, Endurance, Bowel, Medication Management, Safety  PT Balance, Edema, Endurance, Pain, Sensory  OT Balance, Endurance, Pain, Safety, Vision, Motor  SLP    TR         Basic ADL's: OT Bathing, Dressing, Toileting     Advanced  ADL's: OT       Transfers: PT Bed Mobility, Bed to Chair, Car, Manufacturing systems engineer, Metallurgist: PT Ambulation, Emergency planning/management officer     Additional Impairments: OT None  SLP        TR      Anticipated Outcomes Item Anticipated Outcome  Self Feeding Set up A  Swallowing      Basic self-care  Supervision  Toileting  Mod I   Bathroom Transfers Mod I  Bowel/Bladder  manage bowel w mod I  Transfers  supervision assist with LRAD  Locomotion  Supevrision assist with LRAD ambulatory for short household distances.  Communication     Cognition     Pain  pain < 4 w prns  Safety/Judgment  maintain w cues   Therapy Plan: PT Intensity: Minimum of 1-2 x/day ,45 to 90 minutes PT Frequency: 5 out of 7 days PT Duration Estimated Length of Stay: 15-18 days OT Intensity: Minimum of 1-2 x/day, 45 to 90 minutes OT Frequency: 5 out of 7 days OT Duration/Estimated Length of Stay: 2 weeks     Team Interventions: Nursing Interventions Patient/Family Education, Bowel Management, Pain Management, Medication Management, Disease Management/Prevention, Discharge Planning  PT interventions Functional mobility training, Discharge planning, Ambulation/gait training, Psychosocial support, Visual/perceptual remediation/compensation, Therapeutic Activities, Balance/vestibular training, Disease management/prevention, Neuromuscular re-education, Therapeutic Exercise, Skin care/wound management,  Wheelchair propulsion/positioning, Cognitive remediation/compensation, DME/adaptive equipment instruction, Pain management, Splinting/orthotics, UE/LE Strength taining/ROM, Academic librarian, IT trainer, UE/LE Coordination activities, Patient/family education  OT Interventions Training and development officer, Discharge planning, Pain management, Self Care/advanced ADL retraining, Therapeutic Activities, UE/LE Coordination activities, Cognitive remediation/compensation, Disease mangement/prevention, Functional mobility training, Patient/family education, Therapeutic Exercise, Visual/perceptual remediation/compensation, UE/LE Strength taining/ROM, DME/adaptive equipment instruction  SLP Interventions    TR Interventions    SW/CM Interventions Discharge Planning, Psychosocial Support, Patient/Family Education   Barriers to Discharge MD   Requires freq labwork to monitor dropping Hgb  Nursing Decreased caregiver support ILF apartment; one level solo; son to assist prn  PT Decreased caregiver support, Incontinence    OT Incontinence, Other (comments) blindness  SLP      SW Decreased caregiver support     Team Discharge Planning: Destination: PT-Home ,OT- Assisted Living , SLP-  Projected Follow-up: PT-Home health PT, OT-  Home health OT, SLP-  Projected Equipment Needs: PT-To be determined, OT- To be determined, SLP-  Equipment Details: PT- , OT-  Patient/family involved in discharge planning: PT- Patient, Family member/caregiver,  OT-Patient, SLP-   MD ELOS: 5-7d Medical Rehab Prognosis:  Excellent Assessment: The patient has been admitted for CIR therapies with the diagnosis of Fall with large gluteal hematoma and anemia. The team will be addressing functional mobility, strength, stamina, balance, safety, adaptive techniques and equipment, self-care, bowel and bladder mgt, patient and caregiver education, monitor Hgb. Goals have been set at mod I /Sup. [place]ticipated discharge destination  [place] additional hired support.        See Team Conference Notes for weekly updates  to the plan of care

## 2022-03-16 NOTE — Progress Notes (Signed)
Physical Therapy Session Note  Patient Details  Name: Justin Potter MRN: [id number] Date of Birth: 07-Jul-1926  Today's Date: 03/16/2022 PT Individual Time: 1326-1407 PT Individual Time Calculation (min): 41 min   Short Term Goals: Week 1:  PT Short Term Goal 1 (Week 1): Pt will perform bed mobility with min assist PT Short Term Goal 2 (Week 1): Pt will ambulate 7f with min assist PT Short Term Goal 3 (Week 1): Pt will improve TUG to < 227m  Skilled Therapeutic Interventions/Progress Updates: Pt presented in w/c agreeable to therapy. Pt states some soreness in RLE at rest which increased significantly with activity but did not rate throughout session. PTA providing education throughout session regarding pain management with rest breaks and distraction provided as needed. Pt noted to not have taken any pain meds since 2am this morning with PTA educating ok to take pain meds prior to therapy sessions with pt verbalizing understanding. Pt transported to day room and participated in Cybex Kinetron 70cm/sec x 1 min then x 30 sec with rest break between bouts. Pt indicated some increased pain RLE>LLE with this activity. Pt then performed stand pivot transfer to high/low mat with modA to power up to stand and minA for transfer. Pt performed Sit to stand from elevated mat with minA and pt was able to perform toe taps to target x 5 bilaterally. Pt significantly limited by pain in RLE with this activity. Pt ambulated ~[id number] w/c with minA with pt demonstrating limited clearance of RLE. Pt transported back to room at end of session and remained in w/c. Pt left with call bell within reach and current needs met.      Therapy Documentation Precautions:  Precautions Precautions: Fall Restrictions Weight Bearing Restrictions: Yes RLE Weight Bearing: Weight bearing as tolerated Other Position/Activity Restrictions: legally blind General:   Vital Signs: Therapy Vitals Temp: 97.6 F (36.4 C) Temp  Source: Oral Pulse Rate: 62 Resp: (!) 22 BP: 135/66 Patient Position (if appropriate): Sitting Oxygen Therapy SpO2: 100 % O2 Device: Room Air Pain: Pain Assessment Pain Scale: 0-10 Pain Score: 1  Faces Pain Scale: No hurt Pain Type: Acute pain Pain Location: Buttocks Pain Orientation: Right Pain Descriptors / Indicators: Aching Pain Frequency: Intermittent Pain Onset: On-going Pain Intervention(s): Emotional support;Distraction Multiple Pain Sites: No PAINAD (Pain Assessment in Advanced Dementia) Breathing: normal Negative Vocalization: none Body Language: relaxed Consolability: no need to console Mobility:   Locomotion :    Trunk/Postural Assessment :    Balance:   Exercises:   Other Treatments:      Therapy/Group: Individual Therapy  Coda Filler 03/16/2022, 3:54 PM

## 2022-03-16 NOTE — Plan of Care (Signed)
  Problem: RH Balance Goal: LTG Patient will maintain dynamic sitting balance (PT) Description: LTG:  Patient will maintain dynamic sitting balance with assistance during mobility activities (PT) Flowsheets (Taken 03/16/2022 1053) LTG: Pt will maintain dynamic sitting balance during mobility activities with:: Independent with assistive device  Goal: LTG Patient will maintain dynamic standing balance (PT) Description: LTG:  Patient will maintain dynamic standing balance with assistance during mobility activities (PT) Flowsheets (Taken 03/16/2022 1053) LTG: Pt will maintain dynamic standing balance during mobility activities with:: Supervision/Verbal cueing   Problem: Sit to Stand Goal: LTG:  Patient will perform sit to stand with assistance level (PT) Description: LTG:  Patient will perform sit to stand with assistance level (PT) Flowsheets (Taken 03/16/2022 1053) LTG: PT will perform sit to stand in preparation for functional mobility with assistance level: Supervision/Verbal cueing   Problem: RH Bed Mobility Goal: LTG Patient will perform bed mobility with assist (PT) Description: LTG: Patient will perform bed mobility with assistance, with/without cues (PT). Flowsheets (Taken 03/16/2022 1053) LTG: Pt will perform bed mobility with assistance level of: Supervision/Verbal cueing   Problem: RH Bed to Chair Transfers Goal: LTG Patient will perform bed/chair transfers w/assist (PT) Description: LTG: Patient will perform bed to chair transfers with assistance (PT). Flowsheets (Taken 03/16/2022 1053) LTG: Pt will perform Bed to Chair Transfers with assistance level: Supervision/Verbal cueing   Problem: RH Car Transfers Goal: LTG Patient will perform car transfers with assist (PT) Description: LTG: Patient will perform car transfers with assistance (PT). Flowsheets (Taken 03/16/2022 1053) LTG: Pt will perform car transfers with assist:: Minimal Assistance - Patient > 75%   Problem: RH  Ambulation Goal: LTG Patient will ambulate in controlled environment (PT) Description: LTG: Patient will ambulate in a controlled environment, # of feet with assistance (PT). Flowsheets (Taken 03/16/2022 1053) LTG: Pt will ambulate in controlled environ  assist needed:: Supervision/Verbal cueing LTG: Ambulation distance in controlled environment: 85f with LRAD Goal: LTG Patient will ambulate in home environment (PT) Description: LTG: Patient will ambulate in home environment, # of feet with assistance (PT). Flowsheets (Taken 03/16/2022 1053) LTG: Pt will ambulate in home environ  assist needed:: Supervision/Verbal cueing LTG: Ambulation distance in home environment: 2108fwith LRAD   Problem: RH Wheelchair Mobility Goal: LTG Patient will propel w/c in controlled environment (PT) Description: LTG: Patient will propel wheelchair in controlled environment, # of feet with assist (PT) Flowsheets (Taken 03/16/2022 1053) LTG: Pt will propel w/c in controlled environ  assist needed:: Supervision/Verbal cueing LTG: Propel w/c distance in controlled environment: 15056foal: LTG Patient will propel w/c in home environment (PT) Description: LTG: Patient will propel wheelchair in home environment, # of feet with assistance (PT). Flowsheets (Taken 03/16/2022 1053) LTG: Propel w/c distance in home environment: 50

## 2022-03-16 NOTE — Progress Notes (Signed)
PROGRESS NOTE   Subjective/Complaints:  No issues overnight   ROS- neg CP, SOB,  N/V/D  Objective:   No results found. Recent Labs    03/14/22 0639 03/16/22 0517  WBC 12.2* 11.1*  HGB 8.8* 8.3*  HCT 27.2* 25.2*  PLT 183 240    Recent Labs    03/14/22 0417 03/15/22 0941  NA 138 138  K 4.1 3.9  CL 103 104  CO2 25 26  GLUCOSE 114* 163*  BUN 22 19  CREATININE 0.91 0.97  CALCIUM 8.4* 8.5*     Intake/Output Summary (Last 24 hours) at 03/16/2022 0748 Last data filed at 03/16/2022 0500 Gross per 24 hour  Intake 315 ml  Output 500 ml  Net -185 ml         Physical Exam: Vital Signs Blood pressure 128/73, pulse 65, temperature 98.1 F (36.7 C), temperature source Oral, resp. rate 20, height '6\' 2"'$  (1.88 m), weight 81.6 kg, SpO2 97 %.  General: No acute distress Mood and affect are appropriate Heart: Regular rate and rhythm no rubs murmurs or extra sounds Lungs: Clear to auscultation, breathing unlabored, no rales or wheezes Abdomen: Positive bowel sounds, soft nontender to palpation, nondistended Extremities: No clubbing, cyanosis, or edema Skin: No evidence of breakdown, no evidence of rash Neurologic: Cranial nerves II through XII intact, motor strength is 5/5 in bilateral deltoid, bicep, tricep, grip, hip flexor, knee extensors, ankle dorsiflexor and plantar flexor Sensory exam normal sensation to light touch ain bilateral upper and lower extremities  Musculoskeletal: reduced R hip abd due to pain , large ecchymosis Right buttocks    Assessment/Plan: 1. Functional deficits which require 3+ hours per day of interdisciplinary therapy in a comprehensive inpatient rehab setting. Physiatrist is providing close team supervision and 24 hour management of active medical problems listed below. Physiatrist and rehab team continue to assess barriers to discharge/monitor patient progress toward functional and  medical goals  Care Tool:  Bathing    Body parts bathed by patient: Right arm, Left arm, Abdomen, Chest, Right upper leg, Left upper leg, Face   Body parts bathed by helper: Right lower leg, Left lower leg, Front perineal area     Bathing assist Assist Level: Moderate Assistance - Patient 50 - 74%     Upper Body Dressing/Undressing Upper body dressing   What is the patient wearing?: Pull over shirt    Upper body assist Assist Level: Minimal Assistance - Patient > 75%    Lower Body Dressing/Undressing Lower body dressing      What is the patient wearing?: Pants     Lower body assist Assist for lower body dressing: Maximal Assistance - Patient 25 - 49%     Toileting Toileting    Toileting assist Assist for toileting: Maximal Assistance - Patient 25 - 49%     Transfers Chair/bed transfer  Transfers assist  Chair/bed transfer activity did not occur: Safety/medical concerns  Chair/bed transfer assist level: Moderate Assistance - Patient 50 - 74%     Locomotion Ambulation   Ambulation assist      Assist level: Minimal Assistance - Patient > 75% Assistive device: Walker-rolling Max distance: 15   Walk 10  feet activity   Assist     Assist level: Minimal Assistance - Patient > 75% Assistive device: Walker-rolling   Walk 50 feet activity   Assist Walk 50 feet with 2 turns activity did not occur: Safety/medical concerns         Walk 150 feet activity   Assist Walk 150 feet activity did not occur: Safety/medical concerns         Walk 10 feet on uneven surface  activity   Assist Walk 10 feet on uneven surfaces activity did not occur: Safety/medical concerns         Wheelchair     Assist Is the patient using a wheelchair?: Yes      Wheelchair assist level: Supervision/Verbal cueing Max wheelchair distance: 150    Wheelchair 50 feet with 2 turns activity    Assist        Assist Level: Supervision/Verbal cueing    Wheelchair 150 feet activity     Assist      Assist Level: Supervision/Verbal cueing   Blood pressure 128/73, pulse 65, temperature 98.1 F (36.7 C), temperature source Oral, resp. rate 20, height '6\' 2"'$  (1.88 m), weight 81.6 kg, SpO2 97 %.  Medical Problem List and Plan: 1. Functional deficits secondary to large intramuscular hematoma right gluteus maximus muscle measuring at least 11 x 5.6 x 11.4 cm after fall             -patient may  shower             -ELOS/Goals: 7-14 days, with dispo home to independent living-               - Excellent dispo with independent living facility, has nursing aide and PT/OT services available as needed. Son (retired Scientist, clinical (histocompatibility and immunogenetics)) lives 20-30 min away and available for assistance.               - Ambulates with 4 wheeled walked Mod I at baseline; Ind ADLs 2.  Antithrombotics: -DVT/anticoagulation:  Pharmaceutical: Eliquis - resumed Monday 8/21; HgB downtrending gradually since     Latest Ref Rng & Units 03/16/2022    5:17 AM 03/14/2022    6:39 AM 03/13/2022    7:59 AM  CBC  WBC 4.0 - 10.5 K/uL 11.1  12.2  10.5   Hemoglobin 13.0 - 17.0 g/dL 8.3  8.8  9.5   Hematocrit 39.0 - 52.0 % 25.2  27.2  29.7   Platelets 150 - 400 K/uL 240  183  199    Cont to monitor, recheck in am will need outpt CBC ~1wk post d/c             -antiplatelet therapy: N/A 3. Pain Management: Low-dose oxycodone as needed- has no rest pain but pain with movement such as rolling               - Takes Tylenol 1000 mg BID at baseline for chronic back pain 4. Mood/Behavior/Sleep: Provide emotional support             -antipsychotic agents: N/A 5. Neuropsych/cognition: This patient is capable of making decisions on his own behalf. 6. Skin/Wound Care: Routine skin checks                            - Sees podiatry for foot care as OP 7. Fluids/Electrolytes/Nutrition: Routine in and out with follow-up chemistry               -  Diet advancing from full liquid 8/22 8.   Acute blood loss anemia related hematoma.                  - Follow-up CBC                - Monitor hematoma closely for s/s enlargement 9.  Atrial fibrillation as well as bradycardia.  Eliquis resumed.  Follow-up with EP cardiology team.  No current plans for pacer                - Irregular HR but stable rate, monitor 10.  History of prostate cancer with radioactive seed implant.  Follow-up outpatient                - Baseline urinary incontinent with pads at nighttime and briefs during daytime 11.  Macular degeneration.  Resume Ocuvite.                - Pt legally blind, sees "shadows" only beyond 12 in, but can   navigate hallways and performs ADLs independently at baseline   12. Hx cardioembolic stroke d/t Afib, no residual deficits    LOS: 2 days A FACE TO FACE EVALUATION WAS PERFORMED  Charlett Blake 03/16/2022, 7:48 AM

## 2022-03-17 DIAGNOSIS — M25551 Pain in right hip: Secondary | ICD-10-CM

## 2022-03-17 DIAGNOSIS — I4891 Unspecified atrial fibrillation: Secondary | ICD-10-CM | POA: Diagnosis not present

## 2022-03-17 DIAGNOSIS — T148XXA Other injury of unspecified body region, initial encounter: Secondary | ICD-10-CM | POA: Diagnosis not present

## 2022-03-17 DIAGNOSIS — D62 Acute posthemorrhagic anemia: Secondary | ICD-10-CM | POA: Diagnosis not present

## 2022-03-17 LAB — CBC
HCT: 25.2 % — ABNORMAL LOW (ref 39.0–52.0)
Hemoglobin: 8.1 g/dL — ABNORMAL LOW (ref 13.0–17.0)
MCH: 30.6 pg (ref 26.0–34.0)
MCHC: 32.1 g/dL (ref 30.0–36.0)
MCV: 95.1 fL (ref 80.0–100.0)
Platelets: 256 10*3/uL (ref 150–400)
RBC: 2.65 MIL/uL — ABNORMAL LOW (ref 4.22–5.81)
RDW: 13.9 % (ref 11.5–15.5)
WBC: 8.8 10*3/uL (ref 4.0–10.5)
nRBC: 0 % (ref 0.0–0.2)

## 2022-03-17 NOTE — Progress Notes (Signed)
Occupational Therapy Session Note  Patient Details  Name: Justin Potter MRN: [id number] Date of Birth: 03/31/1926  Today's Date: 03/17/2022 OT Individual Time: 9702-6378 & 1400 - 1458  OT Individual Time Calculation (min): 33 min & 58 min    Short Term Goals: Week 1:  OT Short Term Goal 1 (Week 1): Pt will complete 1/3 toileting steps with no more than min A for balance OT Short Term Goal 2 (Week 1): Pt will using AE PRN to dress LB with min A OT Short Term Goal 3 (Week 1): Pt will complete toilet transfer with CGA using LRAD  First Session (1143 - 1216) - Skilled Therapeutic Interventions/Progress Updates:  Pt awake up in w/c upon OT arrival to the room. Pt reports, "I'm not going too good. I'm just not understanding what's going on around me." Pt in agreement for OT session.  Therapy Documentation Precautions:  Precautions Precautions: Fall Restrictions Weight Bearing Restrictions: Yes RLE Weight Bearing: Weight bearing as tolerated Other Position/Activity Restrictions: legally blind Vital Signs: Please see "Flowsheet" for most recent vitals charted by nursing staff.  Pain: Pain Assessment Pain Scale: 0-10 Pain Score: 4  Pain Type: Acute pain Pain Location: Buttocks Pain Orientation: Right;Left Pain Descriptors / Indicators: Aching Pain Onset: On-going Pain Intervention(s): Emotional support;Distraction Multiple Pain Sites: No  ADL: Grooming: Setup (Pt able to complete oral care and facial hygiene with set-up assistance while seated inthe w/c at the sink. Pt does require minimal assistance for thoroughness with shaving face due to vision deficits.) Where Assessed-Grooming: Wheelchair, Sitting at sink ADL Comments: Pt requires increased time to complete grooming tasks secondary to vision deficits. Pt unable to recall performing grooming tasks in previous OT sessions as pt reports, "I haven't brushed my teeth in weeks." Pt in agreement to complete a shower during PM  session.  Cognitive/Orientation Task: Pt reports difficulty with understanding the routine/schedule for rehab as well has having difficulty with recalling tasks from day-to-day. Pt asked if pt appears to have more difficult than at baseline which pt reports "I don't know." Pt reports using Alexa to play music throughout the day instead of watching TV and setting reminders using Alexa. OT assisted pt with playing music on the computer instead of the sound of the TV to assist with orientation as pt frequently plays music at home. Pt reports enjoying Raylene Miyamoto, classical music, and various other types of music. OT plans to consult with PT to see if a referral for ST is needed or if OT can consult with ST to provide cognitive techniques to tools to improve orientation & recall due to limited vision.   Pt requested to stay in the w/c at end of session. Pt left sitting comfortably in the w/c with personal belongings and call light within reach, belt alarm placed and activated, and comfort needs attended to.   -------------------------------------------------------------------------------------------------------------------------------------------------------------------------------------------------------------------------  Second Session (1400 - 1458) - Skilled Therapeutic Interventions/Progress Updates:  Pt awake in chair upon OT arrival to the room. Pt reports, "I guess I could wait to shower whenever I get home." However, pt in agreement for a shower after OT provides education on importance of performing with therapy to assist with safe DC planning. Pt in agreement for OT session.  Therapy Documentation Pain:  03/17/22 1400  Pain Assessment  Pain Scale 0-10  Pain Score 0     ADL's:   03/17/22 1623  ADL  Upper Body Bathing Supervision/safety (Pt able to bathe all UB parts with supervision while seated  in the tub-bench in the shower. Pt requires assistance to retrieve washcloths and sequence  due to decreased vision.)  Where Assessed-Upper Body Bathing Shower  Lower Body Bathing Moderate assistance (Pt able to bathe 3/6 LB parts while sitting on the tub-bench in the shower. Pt able to bathe B thighs and front peri-area. However, pt requires assistance to bathe B feet and buttocks.)  Where Assessed-Lower Body Bathing Shower  Upper Body Dressing Minimal cueing (Pt able to doff and don a pull-over style shirt with minimal assistance for proper clothing orientation and to fully pull down around trunk while seated in the w/c.)  Where Assessed-Upper Body Dressing Wheelchair  Lower Body Dressing Maximal assistance (Pt requires maximal assistance to doff and don pants, briefs,and socks while seated in the w/c prior to and after a shower. Pt requires moderate assist for sit <> stand from w/c and able to maintain standing balance with use of grab bars with minimal assistance while assist is provided to pull clothing up/down.)  Where Assessed-Lower Body Dressing Wheelchair  Toileting Maximal assistance (Pt reports need to void bladder at end of shower with pt requiring maximal assistance to properly place urinal to assist with hygiene.)  Where Assessed-Toileting  (sitting on tub bench in the shower)  Walk-In Shower Transfer Moderate assistance (Pt able to perform a SPT from w/c <> tub-bench in the shower with moderate assistance for sit <> stand portion and minimal assist to ensure standing balance while completing pivots with strong use of grab bars.)  Social research officer, government Method Stand pivot  Metallurgist bars;Transfer tub bench  ADL Comments Pt in agreement to complete a shower on this date. Pt requires increased assistance for LB bathing, however, only requires supervision for UB bathing.Pt requires moderate assistance for sit <> stand from tub-bench in the shower with strong use of grab bars and a towel placed on B feet for improved safety. Pt able to maintain standing  balance with minimal assist and strong use of grab bars while therapist provides assistance to bathe buttocks. Pt reports minimal fatigue after ADL routine. Pt requires increased time throughout session due to vision deficits and to allow pt to perform tactile exploration of the environment and locate items with assist from therapist due to the bathroom being an unfamiliar environment for pt.    Pt requested to stay in the w/c at end of session. Pt left sitting comfortably in the w/c with personal belongings and call light within reach, belt alarm placed and activated, and comfort needs attended to.   Therapy/Group: Individual Therapy  Barbee Shropshire 03/17/2022, 4:33 PM

## 2022-03-17 NOTE — Progress Notes (Signed)
Patient ID: Justin Potter, male   DOB: 1926-02-27, 88 y.o.   MRN: [id number] Met with the patient to review current situation, rehab process, team conference and plan of care. ep Discussed medications, reported son in and out TIW and sets up medications for him. Incontinent PTA; managed w briefs, toileting. Reviewed dietary modifications and food choices to increase calcium, magnesium along with HH diet. Continue to follow along to discharge to address educational needs to facilitate preparation for return to [facility]. Margarito Liner

## 2022-03-17 NOTE — Progress Notes (Signed)
Physical Therapy Session Note  Patient Details  Name: Justin Potter MRN: [id number] Date of Birth: 1925-11-07  Today's Date: 03/17/2022 PT Individual Time: 0850-0945 PT Individual Time Calculation (min): 55 min   Short Term Goals: Week 1:  PT Short Term Goal 1 (Week 1): Pt will perform bed mobility with min assist PT Short Term Goal 2 (Week 1): Pt will ambulate 77f with min assist PT Short Term Goal 3 (Week 1): Pt will improve TUG to < 221m Week 2:    Week 3:     Skilled Therapeutic Interventions/Progress Updates:      Therapy Documentation Precautions:  Precautions Precautions: Fall Restrictions Weight Bearing Restrictions: Yes RLE Weight Bearing: Weight bearing as tolerated Other Position/Activity Restrictions: legally blind General:   Vital Signs:  Pain: Pain Assessment Pain Scale: 0-10 Pain Score: 6  Pain Type: Acute pain Pain Location: Hip Pain Orientation: Right Pain Descriptors / Indicators: Aching Pain Intervention(s): Medication (See eMAR) Mobility:   Locomotion :    Trunk/Postural Assessment :    Balance:   Exercises:   Other Treatments:      Therapy/Group: Individual Therapy  AuLorie Phenix/25/2023, 10:30 AM

## 2022-03-17 NOTE — Progress Notes (Signed)
PROGRESS NOTE   Subjective/Complaints:  No new concerns or complaints. Reports his pain is controlled with oxycodone.   ROS- neg CP, SOB,  N/V/D, abdominal pain  Objective:   No results found. Recent Labs    03/16/22 0517 03/17/22 0627  WBC 11.1* 8.8  HGB 8.3* 8.1*  HCT 25.2* 25.2*  PLT 240 256    Recent Labs    03/15/22 0941  NA 138  K 3.9  CL 104  CO2 26  GLUCOSE 163*  BUN 19  CREATININE 0.97  CALCIUM 8.5*     Intake/Output Summary (Last 24 hours) at 03/17/2022 1254 Last data filed at 03/17/2022 1234 Gross per 24 hour  Intake 476 ml  Output 550 ml  Net -74 ml         Physical Exam: Vital Signs Blood pressure (!) 126/58, pulse (!) 56, temperature 98.3 F (36.8 C), temperature source Oral, resp. rate 20, height '6\' 2"'$  (1.88 m), weight 81.6 kg, SpO2 100 %.  General: No acute distress, in bed Mood and affect are appropriate, pleasant Heart: Regular rate and rhythm no rubs murmurs or extra sounds Lungs: Clear to auscultation, breathing unlabored, no rales or wheezes Abdomen: Positive bowel sounds, soft nontender to palpation, nondistended Extremities: No clubbing, cyanosis, or edema Skin: No evidence of breakdown, no evidence of rash Neurologic: Cranial nerves II through XII intact, motor strength is 5/5 in bilateral deltoid, bicep, tricep, grip, hip flexor, knee extensors, ankle dorsiflexor and plantar flexor Sensory exam normal sensation to light touch ain bilateral upper and lower extremities  Musculoskeletal: reduced R hip abd due to pain , large ecchymosis Right buttocks    Assessment/Plan: 1. Functional deficits which require 3+ hours per day of interdisciplinary therapy in a comprehensive inpatient rehab setting. Physiatrist is providing close team supervision and 24 hour management of active medical problems listed below. Physiatrist and rehab team continue to assess barriers to  discharge/monitor patient progress toward functional and medical goals  Care Tool:  Bathing    Body parts bathed by patient: Right arm, Left arm, Abdomen, Chest, Right upper leg, Left upper leg, Face   Body parts bathed by helper: Right lower leg, Left lower leg, Front perineal area     Bathing assist Assist Level: Moderate Assistance - Patient 50 - 74%     Upper Body Dressing/Undressing Upper body dressing   What is the patient wearing?: Pull over shirt    Upper body assist Assist Level: Minimal Assistance - Patient > 75%    Lower Body Dressing/Undressing Lower body dressing      What is the patient wearing?: Pants     Lower body assist Assist for lower body dressing: Maximal Assistance - Patient 25 - 49%     Toileting Toileting    Toileting assist Assist for toileting: Maximal Assistance - Patient 25 - 49%     Transfers Chair/bed transfer  Transfers assist  Chair/bed transfer activity did not occur: Safety/medical concerns  Chair/bed transfer assist level: Moderate Assistance - Patient 50 - 74%     Locomotion Ambulation   Ambulation assist      Assist level: Minimal Assistance - Patient > 75% Assistive device: Walker-rolling Max  distance: 15   Walk 10 feet activity   Assist     Assist level: Minimal Assistance - Patient > 75% Assistive device: Walker-rolling   Walk 50 feet activity   Assist Walk 50 feet with 2 turns activity did not occur: Safety/medical concerns         Walk 150 feet activity   Assist Walk 150 feet activity did not occur: Safety/medical concerns         Walk 10 feet on uneven surface  activity   Assist Walk 10 feet on uneven surfaces activity did not occur: Safety/medical concerns         Wheelchair     Assist Is the patient using a wheelchair?: Yes Type of Wheelchair: Manual    Wheelchair assist level: Supervision/Verbal cueing Max wheelchair distance: 150    Wheelchair 50 feet with 2 turns  activity    Assist        Assist Level: Supervision/Verbal cueing   Wheelchair 150 feet activity     Assist      Assist Level: Supervision/Verbal cueing   Blood pressure (!) 126/58, pulse (!) 56, temperature 98.3 F (36.8 C), temperature source Oral, resp. rate 20, height '6\' 2"'$  (1.88 m), weight 81.6 kg, SpO2 100 %.  Medical Problem List and Plan: 1. Functional deficits secondary to large intramuscular hematoma right gluteus maximus muscle measuring at least 11 x 5.6 x 11.4 cm after fall             -patient may  shower             -ELOS/Goals: 7-14 days, with dispo home to independent living-               - Excellent dispo with independent living facility, has nursing aide and PT/OT services available as needed. Son (retired Scientist, clinical (histocompatibility and immunogenetics)) lives 20-30 min away and available for assistance.               - Ambulates with 4 wheeled walked Mod I at baseline; Ind ADLs 2.  Antithrombotics: -DVT/anticoagulation:  Pharmaceutical: Eliquis - resumed Monday 8/21; HgB downtrending gradually since      Latest Ref Rng & Units 03/17/2022    6:27 AM 03/16/2022    5:17 AM 03/14/2022    6:39 AM  CBC  WBC 4.0 - 10.5 K/uL 8.8  11.1  12.2   Hemoglobin 13.0 - 17.0 g/dL 8.1  8.3  8.8   Hematocrit 39.0 - 52.0 % 25.2  25.2  27.2   Platelets 150 - 400 K/uL 256  240  183    Cont to monitor, recheck in am will need outpt CBC ~1wk post d/c -HBG slightly decreased slightly to 8.1, will order FOBT             -antiplatelet therapy: N/A 3. Pain Management: Low-dose oxycodone as needed- has no rest pain but pain with movement such as rolling               - Takes Tylenol 1000 mg BID at baseline for chronic back pain  -Right glute pain controled with current medications 4. Mood/Behavior/Sleep: Provide emotional support             -antipsychotic agents: N/A 5. Neuropsych/cognition: This patient is capable of making decisions on his own behalf. 6. Skin/Wound Care: Routine skin checks                             -  Sees podiatry for foot care as OP 7. Fluids/Electrolytes/Nutrition: Routine in and out with follow-up chemistry               - Diet advancing from full liquid 8/22- appears to be tolerating diet 8.  Acute blood loss anemia related hematoma.                  - Follow-up CBC                - Monitor hematoma closely for s/s enlargement 9.  Atrial fibrillation as well as bradycardia.  Eliquis resumed.  Follow-up with EP cardiology team.  No current plans for pacer                - Irregular HR but stable rate, monitor    - Mild Loletha Grayer today 8/25, asymptomatic, continue to monitor 10.  History of prostate cancer with radioactive seed implant.  Follow-up outpatient                - Baseline urinary incontinent with pads at nighttime and briefs during daytime 11.  Macular degeneration.  Resume Ocuvite.                - Pt legally blind, sees "shadows" only beyond 12 in, but can   navigate hallways and performs ADLs independently at baseline   12. Hx cardioembolic stroke d/t Afib, no residual deficits    LOS: 3 days A FACE TO FACE EVALUATION WAS PERFORMED  Jennye Boroughs 03/17/2022, 12:54 PM

## 2022-03-18 DIAGNOSIS — M25551 Pain in right hip: Secondary | ICD-10-CM | POA: Diagnosis not present

## 2022-03-18 DIAGNOSIS — T148XXA Other injury of unspecified body region, initial encounter: Secondary | ICD-10-CM | POA: Diagnosis not present

## 2022-03-18 DIAGNOSIS — F05 Delirium due to known physiological condition: Secondary | ICD-10-CM

## 2022-03-18 DIAGNOSIS — D62 Acute posthemorrhagic anemia: Secondary | ICD-10-CM | POA: Diagnosis not present

## 2022-03-18 LAB — HEMOGLOBIN AND HEMATOCRIT, BLOOD
HCT: 25 % — ABNORMAL LOW (ref 39.0–52.0)
Hemoglobin: 8.1 g/dL — ABNORMAL LOW (ref 13.0–17.0)

## 2022-03-18 NOTE — Progress Notes (Addendum)
Occupational Therapy Session Note  Patient Details  Name: Justin Potter MRN: [id number] Date of Birth: September 03, 1925  Today's Date: 03/18/2022 OT Individual Time: 3428-7681 OT Individual Time Calculation (min): 45 min    Short Term Goals: Week 1:  OT Short Term Goal 1 (Week 1): Pt will complete 1/3 toileting steps with no more than min A for balance OT Short Term Goal 2 (Week 1): Pt will using AE PRN to dress LB with min A OT Short Term Goal 3 (Week 1): Pt will complete toilet transfer with CGA using LRAD  Skilled Therapeutic Interventions/Progress Updates:  The  patient was seen this afternoon for skilled OT services.  Upon arrival, the  pt was positioned supine in bed expecting my arrival, he agreed to participating in  OT treatment. The patient  was able to come from supine to EOB with MinA for managing BLE.  The pt was able to sit EOB unsupported during skilled OT treatment for greater than 30 minutes in duration.  The pt went on to complete a simulated task in UB/LB dressing using medium grade theraband, the pt required demonstration and initial  vc for task performance. The pt complete UB exercises using 1lb dowel for shld flexion, horizontal abduction and shld rotation , 2 sets of 10 with rest breaks as needed, the pt required 1 rest break between sets. The pt returned to bed LOF with MinA for managing his legs with his call light and bedside table within reach.  The bed alarm was activated with all additional needs address prior to exiting the room.  Therapy Documentation Precautions:  Precautions Precautions: Fall Restrictions Weight Bearing Restrictions: Yes RLE Weight Bearing: Weight bearing as tolerated Other Position/Activity Restrictions: legally blind  Therapy/Group: Individual Therapy  Yvonne Kendall 03/18/2022, 4:57 PM

## 2022-03-18 NOTE — Progress Notes (Signed)
Physical Therapy Session Note  Patient Details  Name: Justin Potter MRN: [id number] Date of Birth: 09/26/1925  Today's Date: 03/18/2022 PT Individual Time: 0850-0949 PT Individual Time Calculation (min): 59 min   Short Term Goals: Week 1:  PT Short Term Goal 1 (Week 1): Pt will perform bed mobility with min assist PT Short Term Goal 2 (Week 1): Pt will ambulate 35f with min assist PT Short Term Goal 3 (Week 1): Pt will improve TUG to < 26m   Skilled Therapeutic Interventions/Progress Updates:   Pt received supine in bed and agreeable to PT. Supine>sit transfer with min assist and from PT for log roll technique. Improved control of RLE and use of BUE to push to sitting on this day. Sit<>stand to don pants with CGA from elevated bed height. Stand pivot [facility] and [facility].   WC mobility through hall x [id number] supervision assist from PT for safety.   Standing tolerance/balance in parallel bars to perform forward and cross body reaches to grab and place clothes on basket ball goal. Performed x 5 BUE. No complaints of pain on this day in standing.    Gait training with RW x [id number] CGA. No knee instability noted on the RLE on this day. Pt also noted to have full step through gait pattern and improved cadence.   Seated BLE therex:  LAQ x 10 BLE Hip abduction x 10 BLE red tband Isometric hip adduction x 10 BLE.  HS curl x 8 BLE.  Cues for full ROM and hold at end range for each exercise.   Throughout session pt performed sit<>stand [facility] only and improved position of the RLE compared to prior sessions.    Patient returned to room and left [facility] in reach and all needs met.        Therapy Documentation Precautions:  Precautions Precautions: Fall Restrictions Weight Bearing Restrictions: Yes RLE Weight Bearing: Weight bearing as tolerated Other Position/Activity Restrictions: legally blind   Pain: Pain  Assessment Pain Scale: 0-10 Pain Score: 0-No pain Pain Type: Acute pain    Therapy/Group: Individual Therapy  AustLorie Phenix6/2023, 9:52 AM

## 2022-03-18 NOTE — Progress Notes (Signed)
Pt stated he is DNR. Notified Dr. Curlene Dolphin to change code status. Educated patient and pt well understood.

## 2022-03-18 NOTE — Progress Notes (Signed)
PROGRESS NOTE   Subjective/Complaints:  Pt noted to have mild confusion by nursing this AM.Pt eating lunch. No additional concerns.  Confusion appears to have resolved in afternoon  ROS- neg CP, SOB,  N/V/D, abdominal pain, HA  Objective:   No results found. Recent Labs    03/16/22 0517 03/17/22 0627 03/18/22 0631  WBC 11.1* 8.8  --   HGB 8.3* 8.1* 8.1*  HCT 25.2* 25.2* 25.0*  PLT 240 256  --     No results for input(s): "NA", "K", "CL", "CO2", "GLUCOSE", "BUN", "CREATININE", "CALCIUM" in the last 72 hours.   Intake/Output Summary (Last 24 hours) at 03/18/2022 1249 Last data filed at 03/18/2022 0930 Gross per 24 hour  Intake 240 ml  Output 625 ml  Net -385 ml         Physical Exam: Vital Signs Blood pressure 116/64, pulse (!) 56, temperature 98 F (36.7 C), temperature source Oral, resp. rate 20, height '6\' 2"'$  (1.88 m), weight 81.6 kg, SpO2 99 %.  General: No acute distress, in chair eating lunch Mood and affect are appropriate, pleasant Heart: Regular rate and rhythm no rubs murmurs or extra sounds Lungs: Clear to auscultation, breathing unlabored, no rales or wheezes Abdomen: Positive bowel sounds, soft nontender to palpation, nondistended Extremities: No clubbing, cyanosis, or edema Skin: No evidence of breakdown, no evidence of rash Neurologic: Alert and oriented x4, Follows commands, Cranial nerves II through XII intact, motor strength is 5/5 in bilateral deltoid, bicep, tricep, grip, hip flexor, knee extensors, ankle dorsiflexor and plantar flexor Sensory exam normal sensation to light touch ain bilateral upper and lower extremities  Musculoskeletal: reduced R hip abd due to pain , large ecchymosis Right buttocks    Assessment/Plan: 1. Functional deficits which require 3+ hours per day of interdisciplinary therapy in a comprehensive inpatient rehab setting. Physiatrist is providing close team  supervision and 24 hour management of active medical problems listed below. Physiatrist and rehab team continue to assess barriers to discharge/monitor patient progress toward functional and medical goals  Care Tool:  Bathing    Body parts bathed by patient: Right arm, Left arm, Abdomen, Chest, Right upper leg, Left upper leg, Face   Body parts bathed by helper: Right lower leg, Left lower leg, Front perineal area     Bathing assist Assist Level: Moderate Assistance - Patient 50 - 74%     Upper Body Dressing/Undressing Upper body dressing   What is the patient wearing?: Pull over shirt    Upper body assist Assist Level: Minimal Assistance - Patient > 75%    Lower Body Dressing/Undressing Lower body dressing      What is the patient wearing?: Pants     Lower body assist Assist for lower body dressing: Maximal Assistance - Patient 25 - 49%     Toileting Toileting    Toileting assist Assist for toileting: Maximal Assistance - Patient 25 - 49%     Transfers Chair/bed transfer  Transfers assist  Chair/bed transfer activity did not occur: Safety/medical concerns  Chair/bed transfer assist level: Moderate Assistance - Patient 50 - 74%     Locomotion Ambulation   Ambulation assist  Assist level: Minimal Assistance - Patient > 75% Assistive device: Walker-rolling Max distance: 15   Walk 10 feet activity   Assist     Assist level: Minimal Assistance - Patient > 75% Assistive device: Walker-rolling   Walk 50 feet activity   Assist Walk 50 feet with 2 turns activity did not occur: Safety/medical concerns         Walk 150 feet activity   Assist Walk 150 feet activity did not occur: Safety/medical concerns         Walk 10 feet on uneven surface  activity   Assist Walk 10 feet on uneven surfaces activity did not occur: Safety/medical concerns         Wheelchair     Assist Is the patient using a wheelchair?: Yes Type of  Wheelchair: Manual    Wheelchair assist level: Supervision/Verbal cueing Max wheelchair distance: 150    Wheelchair 50 feet with 2 turns activity    Assist        Assist Level: Supervision/Verbal cueing   Wheelchair 150 feet activity     Assist      Assist Level: Supervision/Verbal cueing   Blood pressure 116/64, pulse (!) 56, temperature 98 F (36.7 C), temperature source Oral, resp. rate 20, height '6\' 2"'$  (1.88 m), weight 81.6 kg, SpO2 99 %.  Medical Problem List and Plan: 1. Functional deficits secondary to large intramuscular hematoma right gluteus maximus muscle measuring at least 11 x 5.6 x 11.4 cm after fall             -patient may  shower             -ELOS/Goals: 7-14 days, with dispo home to independent living-               - Excellent dispo with independent living facility, has nursing aide and PT/OT services available as needed. Son (retired Scientist, clinical (histocompatibility and immunogenetics)) lives 20-30 min away and available for assistance.               - Ambulates with 4 wheeled walked Mod I at baseline; Ind ADLs 2.  Antithrombotics: -DVT/anticoagulation:  Pharmaceutical: Eliquis - resumed Monday 8/21; HgB downtrending gradually since      Latest Ref Rng & Units 03/18/2022    6:31 AM 03/17/2022    6:27 AM 03/16/2022    5:17 AM  CBC  WBC 4.0 - 10.5 K/uL  8.8  11.1   Hemoglobin 13.0 - 17.0 g/dL 8.1  8.1  8.3   Hematocrit 39.0 - 52.0 % 25.0  25.2  25.2   Platelets 150 - 400 K/uL  256  240                -antiplatelet therapy: N/A 3. Pain Management: Low-dose oxycodone as needed- has no rest pain but pain with movement such as rolling               - Takes Tylenol 1000 mg BID at baseline for chronic back pain  -Right glute pain controled with current medications  -Will DC oxycodone, continue tylenol for now 4. Mood/Behavior/Sleep: Provide emotional support             -antipsychotic agents: N/A 5. Neuropsych/cognition: This patient is capable of making decisions on his own  behalf. 6. Skin/Wound Care: Routine skin checks                            - Sees podiatry for foot  care as OP 7. Fluids/Electrolytes/Nutrition: Routine in and out with follow-up chemistry               - Diet advancing from full liquid 8/22- appears to be tolerating diet 8.  Acute blood loss anemia related hematoma.  - Cont to monitor, recheck in am will need outpt CBC ~1wk post d/c  -HBG stable at 8.1 on 03/18/22                - Monitor hematoma closely for s/s enlargement 9.  Atrial fibrillation as well as bradycardia.  Eliquis resumed.  Follow-up with EP cardiology team.  No current plans for pacer                - Irregular HR but stable rate, monitor    - Mild Loletha Grayer today 8/25, asymptomatic, continue to monitor 10.  History of prostate cancer with radioactive seed implant.  Follow-up outpatient                - Baseline urinary incontinent with pads at nighttime and briefs during daytime 11.  Macular degeneration.  Resume Ocuvite.                - Pt legally blind, sees "shadows" only beyond 12 in, but can   navigate hallways and performs ADLs independently at baseline   12. Hx cardioembolic stroke d/t Afib, no residual deficits 13. Mild confusion-imporved, Pt alert and oriented x4, suspect related to oxycodone dose-will discontinue for now and monitor    LOS: 4 days A FACE TO FACE EVALUATION WAS PERFORMED  Jennye Boroughs 03/18/2022, 12:49 PM

## 2022-03-19 DIAGNOSIS — I4891 Unspecified atrial fibrillation: Secondary | ICD-10-CM | POA: Diagnosis not present

## 2022-03-19 DIAGNOSIS — F05 Delirium due to known physiological condition: Secondary | ICD-10-CM | POA: Diagnosis not present

## 2022-03-19 DIAGNOSIS — T148XXA Other injury of unspecified body region, initial encounter: Secondary | ICD-10-CM | POA: Diagnosis not present

## 2022-03-19 DIAGNOSIS — M25551 Pain in right hip: Secondary | ICD-10-CM | POA: Diagnosis not present

## 2022-03-19 NOTE — Progress Notes (Signed)
Occupational Therapy Session Note  Patient Details  Name: Justin Potter MRN: [id number] Date of Birth: [birth year]  Today's Date: 03/19/2022 OT Individual Time: 1350-1506 OT Individual Time Calculation (min): 76 min    Short Term Goals: Week 1:  OT Short Term Goal 1 (Week 1): Pt will complete 1/3 toileting steps with no more than min A for balance OT Short Term Goal 2 (Week 1): Pt will using AE PRN to dress LB with min A OT Short Term Goal 3 (Week 1): Pt will complete toilet transfer with CGA using LRAD  Skilled Therapeutic Interventions/Progress Updates: Patient reports doing well within his home environment for self care. Patient able to improve sit to stand and functional transfer to moderate assist level. He reports doing basic meal preparations at home and was able to perform simulated kitchen tasks standing at a counter. Patient educated on the importance of safe set up and proper use of AE to reduce the risk of a fall. Patient continued treatment with seated endurance activities. Utilized 2# dowel for proximal strength and endurance exercises. Returned to room at end of session and requested to lie down. Patient able to transfer back to bed using a walker from the wheel chair with moderate assist. From EOB to supine patient needed moderate assistance to guide legs back to bed. Continue with skilled OT POC to continue progressing towards LTG's     Therapy Documentation Precautions:  Precautions Precautions: Fall Restrictions Weight Bearing Restrictions: Yes RLE Weight Bearing: Weight bearing as tolerated Other Position/Activity Restrictions: legally blind General:   Vital Signs: Therapy Vitals Temp: 97.6 F (36.4 C) Pulse Rate: 68 Resp: 15 BP: (!) 106/56 Patient Position (if appropriate): Sitting Oxygen Therapy SpO2: 99 % O2 Device: Room Air Pain:   ADL: ADL Grooming: Setup while seated inthe w/c at the sinkToileting: Maximal assistance (Pt reports need to void  bladder at end of shower with pt requiring maximal assistance to properly place urinal to assist with hygiene.) Where Assessed-Toileting:  BSC Toilet Transfer: Moderate assistance Toilet Transfer Method: Stand pivot Vision: legally blind due to macular degeneration, but able to reach for items with high contrast.     Balance: dynamic standing actiities performed at counter height surface with only CGA after Moderate assist during transition from w/c to standing.      Therapy/Group: Individual Therapy  Hermina Barters 03/19/2022, 3:09 PM

## 2022-03-19 NOTE — Progress Notes (Signed)
Physical Therapy Session Note  Patient Details  Name: Justin Potter MRN: [id number] Date of Birth: [birth year]  Today's Date: 03/19/2022 PT Individual Time: 0909-1005 and 1130-1234 PT Individual Time Calculation (min): 56 min and 64 min  Short Term Goals: Week 1:  PT Short Term Goal 1 (Week 1): Pt will perform bed mobility with min assist PT Short Term Goal 2 (Week 1): Pt will ambulate 66f with min assist PT Short Term Goal 3 (Week 1): Pt will improve TUG to < 270m   Skilled Therapeutic Interventions/Progress Updates:  Session 1: Patient supine in bed on entrance to room. Patient alert and agreeable to PT session.   Patient with no pain complaint at start of session. Mild discomfort at R hip. Pt does however c/o not receiving assist during breakfast despite request for assistance d/t his vision. Related to RN.   Therapeutic Activity: Bed Mobility: Pt performed supine --> sit with supervision and extra time with use of bedrail. At end of session, pt requires MinA to bring BLE to bed surface with return to supine. VC/ tc required for technique throughout.  Transfers: Pt performed sit<>stand transfers with MinA initially and improving to CGA/ supervision by end of session to/ from bed, w/c and toilet. Stand pivot transfers throughout session with CGA. Provided verbal cues for technique, BLE positioning and forward weight shift over BOS in rise to stand.  Gait Training:  Pt ambulated 45' x1 to enter bathroom form hallway, and then 1865x1 to return to seated position on EOB in ambulatory transfer from toilet to bed. Pt states feeling good with min/ mod but tolerable pain in R hip and shifting weight laterally very well. Uses RW throughout. Demonstrated flexed posture with hesitant steps initially and able to increased RLE step height with vc.   Patient supine in bed at end of session with brakes locked, bed alarm set, and all needs within reach.  Session 2: Patient supine in bed on  entrance to room. Patient alert and agreeable to PT session.   Patient with minimal pain complaint at start of session at R hip.   Therapeutic Activity: Bed Mobility: Pt performed supine --> sit with supervision and use of bedrail. VC provided for technique.  Transfers: Pt performed sit<>stand and stand pivot transfers throughout session with CGA/ supervision. Provided verbal cues for technique in weight shifting and body positioning.   Neuromuscular Re-ed: NMR facilitated during session with focus on standing balance and tolerance. Pt guided in standing activity involving toss of beanbags to target that pt relates he can see with contrast in flooring. Pt stands throughout and with LUE on RW, he is able to maintain good balance throughout armswing and release of beanbags. No bags in target with learning occurring and pt assisted with provision of KP and KR after each toss for increased learning d/t low vision. 10 bags on board. During 2nd bout, pt is able to hit 2 targets with 8  bags on the board demonstrating learning. Changes armswing from front toss to armswing at side with release. No LOB throughout and pt happy with fun, competitive activity. NMR performed for improvements in motor control and coordination, balance, sequencing, judgement, and self confidence/ efficacy in performing all aspects of mobility at highest level of independence.   Patient seated upright at end of session with brakes locked, no alarm set as pt being set up with lunch tray and assist provided by RN. All needs within reach.   Therapy Documentation Precautions:  Precautions Precautions:  Fall Restrictions Weight Bearing Restrictions: Yes RLE Weight Bearing: Weight bearing as tolerated Other Position/Activity Restrictions: legally blind General:   Vital Signs: Therapy Vitals Temp: 97.6 F (36.4 C) Pulse Rate: 68 Resp: 15 BP: (!) 106/56 Patient Position (if appropriate): Sitting Oxygen Therapy SpO2: 99 % O2  Device: Room Air Pain:  Pt relates tolerable pain throughout both sessions with Wong-Baker score of 3-4/ 10.   Therapy/Group: Individual Therapy  Alger Simons PT, DPT, CSRS 03/19/2022, 3:06 PM

## 2022-03-19 NOTE — Progress Notes (Signed)
PROGRESS NOTE   Subjective/Complaints:  Confusion appears to be improved today. He is eating lunch. No new concerns.   ROS- neg CP, SOB,  N/V/D, abdominal pain, HA  Objective:   No results found. Recent Labs    03/17/22 0627 03/18/22 0631  WBC 8.8  --   HGB 8.1* 8.1*  HCT 25.2* 25.0*  PLT 256  --     No results for input(s): "NA", "K", "CL", "CO2", "GLUCOSE", "BUN", "CREATININE", "CALCIUM" in the last 72 hours.   Intake/Output Summary (Last 24 hours) at 03/19/2022 1445 Last data filed at 03/19/2022 1315 Gross per 24 hour  Intake 960 ml  Output 602 ml  Net 358 ml         Physical Exam: Vital Signs Blood pressure (!) 106/56, pulse 68, temperature 97.6 F (36.4 C), resp. rate 15, height '6\' 2"'$  (1.88 m), weight 81.6 kg, SpO2 99 %.  General: No acute distress, in chair eating lunch Mood and affect are appropriate, pleasant Heart: Regular rate and rhythm no rubs murmurs or extra sounds Lungs: Clear to auscultation, breathing unlabored, no rales or wheezes Abdomen: Positive bowel sounds, soft nontender to palpation, nondistended Extremities: No clubbing, cyanosis, or edema Skin: No evidence of breakdown, no evidence of rash Neurologic: Alert and oriented x4, answers president correctly, answers age correctly, Follows commands, Cranial nerves II through XII intact, motor strength is 5/5 in bilateral deltoid, bicep, tricep, grip, hip flexor, knee extensors, ankle dorsiflexor and plantar flexor Sensory exam normal sensation to light touch ain bilateral upper and lower extremities  Musculoskeletal: reduced R hip abd due to pain , large ecchymosis Right buttocks    Assessment/Plan: 1. Functional deficits which require 3+ hours per day of interdisciplinary therapy in a comprehensive inpatient rehab setting. Physiatrist is providing close team supervision and 24 hour management of active medical problems listed  below. Physiatrist and rehab team continue to assess barriers to discharge/monitor patient progress toward functional and medical goals  Care Tool:  Bathing    Body parts bathed by patient: Right arm, Left arm, Abdomen, Chest, Right upper leg, Left upper leg, Face   Body parts bathed by helper: Right lower leg, Left lower leg, Front perineal area     Bathing assist Assist Level: Moderate Assistance - Patient 50 - 74%     Upper Body Dressing/Undressing Upper body dressing   What is the patient wearing?: Pull over shirt    Upper body assist Assist Level: Minimal Assistance - Patient > 75%    Lower Body Dressing/Undressing Lower body dressing      What is the patient wearing?: Pants     Lower body assist Assist for lower body dressing: Maximal Assistance - Patient 25 - 49%     Toileting Toileting    Toileting assist Assist for toileting: Maximal Assistance - Patient 25 - 49%     Transfers Chair/bed transfer  Transfers assist  Chair/bed transfer activity did not occur: Safety/medical concerns  Chair/bed transfer assist level: Moderate Assistance - Patient 50 - 74%     Locomotion Ambulation   Ambulation assist      Assist level: Minimal Assistance - Patient > 75% Assistive device: Walker-rolling Max  distance: 15   Walk 10 feet activity   Assist     Assist level: Minimal Assistance - Patient > 75% Assistive device: Walker-rolling   Walk 50 feet activity   Assist Walk 50 feet with 2 turns activity did not occur: Safety/medical concerns         Walk 150 feet activity   Assist Walk 150 feet activity did not occur: Safety/medical concerns         Walk 10 feet on uneven surface  activity   Assist Walk 10 feet on uneven surfaces activity did not occur: Safety/medical concerns         Wheelchair     Assist Is the patient using a wheelchair?: Yes Type of Wheelchair: Manual    Wheelchair assist level: Supervision/Verbal  cueing Max wheelchair distance: 150    Wheelchair 50 feet with 2 turns activity    Assist        Assist Level: Supervision/Verbal cueing   Wheelchair 150 feet activity     Assist      Assist Level: Supervision/Verbal cueing   Blood pressure (!) 106/56, pulse 68, temperature 97.6 F (36.4 C), resp. rate 15, height '6\' 2"'$  (1.88 m), weight 81.6 kg, SpO2 99 %.  Medical Problem List and Plan: 1. Functional deficits secondary to large intramuscular hematoma right gluteus maximus muscle measuring at least 11 x 5.6 x 11.4 cm after fall             -patient may  shower             -ELOS/Goals: 7-14 days, with dispo home to independent living-               - Excellent dispo with independent living facility, has nursing aide and PT/OT services available as needed. Son (retired Scientist, clinical (histocompatibility and immunogenetics)) lives 20-30 min away and available for assistance.               - Ambulates with 4 wheeled walked Mod I at baseline; Ind ADLs 2.  Antithrombotics: -DVT/anticoagulation:  Pharmaceutical: Eliquis - resumed Monday 8/21; HgB downtrending gradually since      Latest Ref Rng & Units 03/18/2022    6:31 AM 03/17/2022    6:27 AM 03/16/2022    5:17 AM  CBC  WBC 4.0 - 10.5 K/uL  8.8  11.1   Hemoglobin 13.0 - 17.0 g/dL 8.1  8.1  8.3   Hematocrit 39.0 - 52.0 % 25.0  25.2  25.2   Platelets 150 - 400 K/uL  256  240                -antiplatelet therapy: N/A 3. Pain Management: Low-dose oxycodone as needed- has no rest pain but pain with movement such as rolling               - Takes Tylenol 1000 mg BID at baseline for chronic back pain  -Right glute pain controled with current medications  -Will DC oxycodone, continue tylenol for now  -Pain controlled today 4. Mood/Behavior/Sleep: Provide emotional support             -antipsychotic agents: N/A 5. Neuropsych/cognition: This patient is capable of making decisions on his own behalf. 6. Skin/Wound Care: Routine skin checks                             - Sees podiatry for foot care as OP 7. Fluids/Electrolytes/Nutrition: Routine in and out with follow-up  chemistry               - Diet advancing from full liquid 8/22- appears to be tolerating diet 8.  Acute blood loss anemia related hematoma.  - Cont to monitor, recheck in am will need outpt CBC ~1wk post d/c  -HBG stable at 8.1 on 03/18/22                - Monitor hematoma closely for s/s enlargement 9.  Atrial fibrillation as well as bradycardia.  Eliquis resumed.  Follow-up with EP cardiology team.  No current plans for pacer                - Irregular HR but stable rate, monitor    - Mild Loletha Grayer today 8/25, asymptomatic, continue to monitor  -Currently pulse 68, occasional brady, asymptomatic 10.  History of prostate cancer with radioactive seed implant.  Follow-up outpatient                - Baseline urinary incontinent with pads at nighttime and briefs during daytime 11.  Macular degeneration.  Resume Ocuvite.                - Pt legally blind, sees "shadows" only beyond 12 in, but can   navigate hallways and performs ADLs independently at baseline   12. Hx cardioembolic stroke d/t Afib, no residual deficits 13. Mild confusion-imporved, Pt alert and oriented x4, suspect related to oxycodone dose-will discontinue for now and monitor  -resolved     LOS: 5 days A FACE TO FACE EVALUATION WAS PERFORMED  Jennye Boroughs 03/19/2022, 2:45 PM

## 2022-03-20 ENCOUNTER — Inpatient Hospital Stay (HOSPITAL_COMMUNITY): Payer: Medicare Other

## 2022-03-20 DIAGNOSIS — T148XXA Other injury of unspecified body region, initial encounter: Secondary | ICD-10-CM | POA: Diagnosis not present

## 2022-03-20 DIAGNOSIS — R1314 Dysphagia, pharyngoesophageal phase: Secondary | ICD-10-CM | POA: Diagnosis not present

## 2022-03-20 LAB — BASIC METABOLIC PANEL
Anion gap: 6 (ref 5–15)
BUN: 25 mg/dL — ABNORMAL HIGH (ref 8–23)
CO2: 27 mmol/L (ref 22–32)
Calcium: 8.6 mg/dL — ABNORMAL LOW (ref 8.9–10.3)
Chloride: 107 mmol/L (ref 98–111)
Creatinine, Ser: 0.79 mg/dL (ref 0.61–1.24)
GFR, Estimated: >60 mL/min (ref >60)
Glucose, Bld: 110 mg/dL — ABNORMAL HIGH (ref 70–99)
Potassium: 4 mmol/L (ref 3.5–5.1)
Sodium: 140 mmol/L (ref 135–145)

## 2022-03-20 LAB — CBC
HCT: 28 % — ABNORMAL LOW (ref 39.0–52.0)
Hemoglobin: 9 g/dL — ABNORMAL LOW (ref 13.0–17.0)
MCH: 30.8 pg (ref 26.0–34.0)
MCHC: 32.1 g/dL (ref 30.0–36.0)
MCV: 95.9 fL (ref 80.0–100.0)
Platelets: 367 10*3/uL (ref 150–400)
RBC: 2.92 MIL/uL — ABNORMAL LOW (ref 4.22–5.81)
RDW: 14.4 % (ref 11.5–15.5)
WBC: 8.9 10*3/uL (ref 4.0–10.5)
nRBC: 0 % (ref 0.0–0.2)

## 2022-03-20 NOTE — Progress Notes (Signed)
Physical Therapy Session Note  Patient Details  Name: Justin Potter MRN: [id number] Date of Birth: 1925-08-16  Today's Date: 03/20/2022 PT Individual Time: 1302-1400 PT Individual Time Calculation (min): 58 min   Short Term Goals: Week 1:  PT Short Term Goal 1 (Week 1): Pt will perform bed mobility with min assist PT Short Term Goal 2 (Week 1): Pt will ambulate 86f with min assist PT Short Term Goal 3 (Week 1): Pt will improve TUG to < 240m   Skilled Therapeutic Interventions/Progress Updates:  Patient supine in bed on entrance to room. Patient alert and agreeable to PT session. Pt happy to see someone as he has been uncomfortable in this position. Easy touch call button to pt's L out of reach on side table.   Pt is scheduled for transport to swallow study at start of session so entire session held in room and in hallway outside of pt room.   Patient with moderate pain complaint at start of session to R hip/ buttock d/t positioning in bed. Addressed with repositioning.  Therapeutic Activity: Bed Mobility: Pt performed supine --> sit with extra time and elevating HOB. VC/ tc required for sequencing initially for BLE off bed to pt's L side, then pt performing on own and able to push UB up with use of bedrail and supervision.  Transfers: Pt performed sit<>stand transfers initially with MinA and improves to supervision throughout session. Stand pivot transfers throughout session with CGA and vc/ tc to complete pivot stepping to seat prior to descent to sit.   Gait Training:  Pt ambulated short distances within room as well as 175' x1 using RW with CGA/ supervision. Demonstrated flexed forward posture. Improvement noted in hip extension of RLE during stance phase. Provided vc/ tc for increasing R knee and hip flexion for improved foot clearance.   Neuromuscular Re-ed: NMR facilitated during session with focus on standing balance. Pt guided in rise to stand to no AD. Performed with  difficulty as pt is unable to bring Bil  feet far enough back under seat for adequate weight shift. Continues to require adjustment of foot placement on BUE push up from armrests in order to attain balance. Performed x4. NMR performed for improvements in motor control and coordination, balance, sequencing, judgement, and self confidence/ efficacy in performing all aspects of mobility at highest level of independence.   Patient seated upright in w/c at end of session with brakes locked, belt alarm set, and all needs within reach. No transport arrival prior to end of session.   Therapy Documentation Precautions:  Precautions Precautions: Fall Restrictions Weight Bearing Restrictions: Yes RLE Weight Bearing: Weight bearing as tolerated Other Position/Activity Restrictions: legally blind General:   Vital Signs: Therapy Vitals Temp: 97.8 F (36.6 C) Temp Source: Oral Pulse Rate: (!) 57 Resp: 18 BP: (!) 149/77 Patient Position (if appropriate): Lying Oxygen Therapy SpO2: 100 % O2 Device: Room Air Pain: Pain Assessment Pain Score: 3  Pain Type: Acute pain Pain Location: Hip Pain Orientation: Right Pain Descriptors / Indicators: Aching   Therapy/Group: Individual Therapy  JuAlger SimonsT, DPT, CSRS 03/20/2022, 3:31 PM

## 2022-03-20 NOTE — Progress Notes (Signed)
Physical Therapy Session Note  Patient Details  Name: FARRIS GEIMAN MRN: [id number] Date of Birth: Feb 12, 1926  Today's Date: 03/20/2022 PT Individual Time: 1502-1520 PT Individual Time Calculation (min): 18 min  and Today's Date: 03/20/2022 PT Missed Time: 57 Minutes Missed Time Reason: Xray;Patient ill (Comment) (Pt initially out of room for swallow study, then feeling ill after completion of swallow study.)  Short Term Goals: Week 1:  PT Short Term Goal 1 (Week 1): Pt will perform bed mobility with min assist PT Short Term Goal 2 (Week 1): Pt will ambulate 45f with min assist PT Short Term Goal 3 (Week 1): Pt will improve TUG to < 265m  Skilled Therapeutic Interventions/Progress Updates:  Patient supine flat on bed with slight bend to knees on entrance to room. Patient alert but relating feeling ill and not wanting to exit bed for PT session.   Patient with stomach pain complaint at start of session most likely d/t barium from swallow study.   No easy touch call button near pt on entrance to room. Regular call button and remote to pt's L side.   Therapeutic Activity: Pt relates drinking the solution for the study and then being put through scanner which was was very painful for pt as R buttock/ hip with extreme pain. Relates that following this and during transport back to room, pt suddenly felt better with decreased pain in R buttock/ hip. Is thirsty and with chart review and double check with RN, pt allowed to have requested ice water. Provided to pt and related need for upright sitting position in order to consume at this time d/t no swallow instructions posted yet. Pt unwilling to sit up as he states he is very comfortable in current position in bed. Provided with warmed blanket. Reminded again as to required positioning for all food and drink consumption. Pt willing to wait d/t good comfort level.  Pt relates need to rest and will participate with therapy tomorrow.   Patient  supine in bed at end of session with brakes locked, bed alarm set, and all needs within reach. Easy touch call button on pt's chest next to L hand and water to pt's L side on tray table.    Therapy Documentation Precautions:  Precautions Precautions: Fall Restrictions Weight Bearing Restrictions: Yes RLE Weight Bearing: Weight bearing as tolerated Other Position/Activity Restrictions: legally blind General: PT Amount of Missed Time (min): 5737inutes PT Missed Treatment Reason: Xray;Patient ill (Comment) (Pt initially out of room for swallow study, then feeling ill after completion of swallow study.) Vital Signs: Therapy Vitals Temp: 97.8 F (36.6 C) Temp Source: Oral Pulse Rate: (!) 57 Resp: 18 BP: (!) 149/77 Patient Position (if appropriate): Lying Oxygen Therapy SpO2: 100 % O2 Device: Room Air Pain:  Pt currently comfortable with no pain complaint.   Therapy/Group: Individual Therapy  JuAlger SimonsT, DPT, CSRS 03/20/2022, 4:29 PM

## 2022-03-20 NOTE — Progress Notes (Signed)
Occupational Therapy Session Note  Patient Details  Name: Justin Potter MRN: [id number] Date of Birth: Mar 27, 1926  Today's Date: 03/20/2022 OT Individual Time: 4098-1191 OT Individual Time Calculation (min): 75 min    Short Term Goals: Week 1:  OT Short Term Goal 1 (Week 1): Pt will complete 1/3 toileting steps with no more than min A for balance OT Short Term Goal 2 (Week 1): Pt will using AE PRN to dress LB with min A OT Short Term Goal 3 (Week 1): Pt will complete toilet transfer with CGA using LRAD  Skilled Therapeutic Interventions/Progress Updates:    Pt received in bed. We discussed his PLOF in ILF and pt stated he feels he will probably need ALF which is in the same residence area that he lives now.  Pt feels with his macular degeneration, AL would be safer to assist him with laundry, showers, etc.  Discussed that he will need to work towards independence with mobility in his apt, toileting, dressing, etc.   Pt agreeable to a shower. Able to sit to EOB with Supervision and extra time to move R leg into place. Able to stand from bed, elevated toilet, tub bench (with use of bars) and w/c to RW all with CGA.  Pt is tolerating movement better.   He did need cues for appropriate hygiene post toileting.  See ADL documentation below.  With RW, he tends to push walker too far in front and needs cues for body positioning. Excellent participation today.  Followed directions well.       Therapy Documentation Precautions:  Precautions Precautions: Fall Restrictions Weight Bearing Restrictions: Yes RLE Weight Bearing: Weight bearing as tolerated Other Position/Activity Restrictions: legally blind  Pain: Pain Assessment Pain Scale: Faces Pain Score: 3  Faces Pain Scale: No hurt Pain Type: Acute pain Pain Location: Hip Pain Orientation: Right Pain Descriptors / Indicators: Aching Pt stated he did not need medication at this time, used shower heat to help with hip.    ADL: ADL Eating: Set up  Grooming: Setup (Pt able to complete oral care and facial hygiene with set-up assistance while seated inthe w/c at the sink. Pt does require minimal assistance for thoroughness with shaving face due to vision deficits.) Where Assessed-Grooming: Wheelchair, Sitting at sink Upper Body Bathing: Supervision/safety (Pt able to bathe all UB parts with supervision while seated in the tub-bench in the shower. Pt requires assistance to retrieve washcloths and sequence due to decreased vision.) Where Assessed-Upper Body Bathing: Shower Lower Body Bathing: Minimal assistance (for A with bottom.  Used LH sponge for feet.) Where Assessed-Lower Body Bathing: Shower Upper Body Dressing: Setup Where Assessed-Upper Body Dressing: Wheelchair Lower Body Dressing: Maximal assistance Where Assessed-Lower Body Dressing: Wheelchair Toileting: Moderate assistance Where Assessed-Toileting: Glass blower/designer: Therapist, music Method: Counselling psychologist: Grab bars, Raised toilet seat Tub/Shower Transfer: Unable to assess Tub/Shower Transfer Method: Unable to assess Social research officer, government: Curator Method: Heritage manager: Grab bars, Transfer tub bench ADL Comments: Pt required increased A with LB dressing due to visual impairments and difficulty fully flexing at hip.  Reacher difficulty for him to use due to low vision.      Therapy/Group: Individual Therapy  Lynd 03/20/2022, 12:16 PM

## 2022-03-20 NOTE — Progress Notes (Signed)
PROGRESS NOTE   Subjective/Complaints:  No issues overnite but having problem with feeding and swallowing.  Pt states that sometimes food does not "go down well"  Does better in seated position than in bed .  Also has problems visualizing food on plate (has macular degeneration) has full supervision with meals   ROS- neg CP, SOB,  N/V/D, abdominal pain, HA  Objective:   No results found. Recent Labs    03/18/22 0631  HGB 8.1*  HCT 25.0*    No results for input(s): "NA", "K", "CL", "CO2", "GLUCOSE", "BUN", "CREATININE", "CALCIUM" in the last 72 hours.   Intake/Output Summary (Last 24 hours) at 03/20/2022 0815 Last data filed at 03/20/2022 0443 Gross per 24 hour  Intake 720 ml  Output 50 ml  Net 670 ml         Physical Exam: Vital Signs Blood pressure (!) 155/64, pulse (!) 56, temperature 98 F (36.7 C), resp. rate 16, height '6\' 2"'$  (1.88 m), weight 81.6 kg, SpO2 100 %.  General: No acute distress, in chair eating lunch Mood and affect are appropriate, pleasant Heart: Regular rate and rhythm no rubs murmurs or extra sounds Lungs: Clear to auscultation, breathing unlabored, no rales or wheezes Abdomen: Positive bowel sounds, soft nontender to palpation, nondistended Extremities: No clubbing, cyanosis, or edema Skin: No evidence of breakdown, no evidence of rash Neurologic: Alert and oriented x4, answers president correctly, answers age correctly, Follows commands, Cranial nerves II through XII intact, motor strength is 5/5 in bilateral deltoid, bicep, tricep, grip, Left hip flexor, knee extensors, ankle dorsiflexor and plantar flexor RLE 3- proximal muscle due to hip/buttocks pain Sensory exam normal sensation to light touch ain bilateral upper and lower extremities  Musculoskeletal: reduced R hip abd due to pain , large ecchymosis Right buttocks    Assessment/Plan: 1. Functional deficits which require 3+ hours  per day of interdisciplinary therapy in a comprehensive inpatient rehab setting. Physiatrist is providing close team supervision and 24 hour management of active medical problems listed below. Physiatrist and rehab team continue to assess barriers to discharge/monitor patient progress toward functional and medical goals  Care Tool:  Bathing    Body parts bathed by patient: Right arm, Left arm, Abdomen, Chest, Right upper leg, Left upper leg, Face   Body parts bathed by helper: Right lower leg, Left lower leg, Front perineal area     Bathing assist Assist Level: Moderate Assistance - Patient 50 - 74%     Upper Body Dressing/Undressing Upper body dressing   What is the patient wearing?: Pull over shirt    Upper body assist Assist Level: Minimal Assistance - Patient > 75%    Lower Body Dressing/Undressing Lower body dressing      What is the patient wearing?: Pants     Lower body assist Assist for lower body dressing: Maximal Assistance - Patient 25 - 49%     Toileting Toileting    Toileting assist Assist for toileting: Moderate Assistance - Patient 50 - 74%     Transfers Chair/bed transfer  Transfers assist  Chair/bed transfer activity did not occur: Safety/medical concerns  Chair/bed transfer assist level: Moderate Assistance - Patient 50 -  74%     Locomotion Ambulation   Ambulation assist      Assist level: Minimal Assistance - Patient > 75% Assistive device: Walker-rolling Max distance: 15   Walk 10 feet activity   Assist     Assist level: Minimal Assistance - Patient > 75% Assistive device: Walker-rolling   Walk 50 feet activity   Assist Walk 50 feet with 2 turns activity did not occur: Safety/medical concerns         Walk 150 feet activity   Assist Walk 150 feet activity did not occur: Safety/medical concerns         Walk 10 feet on uneven surface  activity   Assist Walk 10 feet on uneven surfaces activity did not occur:  Safety/medical concerns         Wheelchair     Assist Is the patient using a wheelchair?: Yes Type of Wheelchair: Manual    Wheelchair assist level: Supervision/Verbal cueing Max wheelchair distance: 150    Wheelchair 50 feet with 2 turns activity    Assist        Assist Level: Supervision/Verbal cueing   Wheelchair 150 feet activity     Assist      Assist Level: Supervision/Verbal cueing   Blood pressure (!) 155/64, pulse (!) 56, temperature 98 F (36.7 C), resp. rate 16, height '6\' 2"'$  (1.88 m), weight 81.6 kg, SpO2 100 %.  Medical Problem List and Plan: 1. Functional deficits secondary to large intramuscular hematoma right gluteus maximus muscle measuring at least 11 x 5.6 x 11.4 cm after fall             -patient may  shower             -ELOS/Goals: 7-14 days, with dispo home to independent living-               - Excellent dispo with independent living facility, has nursing aide and PT/OT services available as needed. Son (retired Scientist, clinical (histocompatibility and immunogenetics)) lives 20-30 min away and available for assistance.               - Ambulates with 4 wheeled walked Mod I at baseline; Ind ADLs 2.  Antithrombotics: -DVT/anticoagulation:  Pharmaceutical: Eliquis - resumed Monday 8/21; HgB stable at ~8     Latest Ref Rng & Units 03/18/2022    6:31 AM 03/17/2022    6:27 AM 03/16/2022    5:17 AM  CBC  WBC 4.0 - 10.5 K/uL  8.8  11.1   Hemoglobin 13.0 - 17.0 g/dL 8.1  8.1  8.3   Hematocrit 39.0 - 52.0 % 25.0  25.2  25.2   Platelets 150 - 400 K/uL  256  240                -antiplatelet therapy: N/A 3. Pain Management: Low-dose oxycodone as needed- has no rest pain but pain with movement such as rolling               - Takes Tylenol 1000 mg BID at baseline for chronic back pain  -Right glute pain controled with current medications  -Will DC oxycodone, continue tylenol for now  -Pain controlled today 4. Mood/Behavior/Sleep: Provide emotional support              -antipsychotic agents: N/A 5. Neuropsych/cognition: This patient is capable of making decisions on his own behalf. 6. Skin/Wound Care: Routine skin checks                            -  Sees podiatry for foot care as OP 7. Fluids/Electrolytes/Nutrition: Routine in and out with follow-up chemistry               - Diet advancing from full liquid 8/22- appears to be tolerating diet 8.  Acute blood loss anemia related hematoma.  - Cont to monitor, recheck in am will need outpt CBC ~1wk post d/c  -HBG stable at 8.1 on 03/18/22                - Monitor hematoma closely for s/s enlargement 9.  Atrial fibrillation as well as bradycardia.  Eliquis resumed.  Follow-up with EP cardiology team.  No current plans for pacer                - Irregular HR but stable rate, monitor    - Mild Loletha Grayer today 8/25, asymptomatic, continue to monitor  -Currently pulse 68, occasional brady, asymptomatic 10.  History of prostate cancer with radioactive seed implant.  Follow-up outpatient                - Baseline urinary incontinent with pads at nighttime and briefs during daytime 11.  Macular degeneration.  Resume Ocuvite.                - Pt legally blind, sees "shadows" only beyond 12 in, but can   navigate hallways and performs ADLs independently at baseline   12. Hx cardioembolic stroke d/t Afib, no residual deficits 13. Mild confusion-imporved, Pt alert and oriented x4, suspect related to oxycodone dose-will discontinue for now and monitor  -resolved    14.  Dysphagia, sounds like esophageal phase check Ba++ esophagram, SLP eval , up in chair for meals  LOS: 6 days A FACE TO [place] E Carle Dargan 03/20/2022, 8:15 AM

## 2022-03-21 ENCOUNTER — Ambulatory Visit: Payer: Medicare Other | Admitting: Nurse Practitioner

## 2022-03-21 DIAGNOSIS — T148XXA Other injury of unspecified body region, initial encounter: Secondary | ICD-10-CM | POA: Diagnosis not present

## 2022-03-21 DIAGNOSIS — R1314 Dysphagia, pharyngoesophageal phase: Secondary | ICD-10-CM | POA: Diagnosis not present

## 2022-03-21 NOTE — Consult Note (Signed)
[facility] Consult  Referring Provider: Philomena Doheny Primary Care Physician:  [name], MD Primary Gastroenterologist: Dr.Magod  Reason for Consultation: Dysphagia, abnormal barium swallow  HPI: Justin Potter is a 88 y.o. male was admitted with increasing right hip pain and associated hematoma noted on CAT scan.  GI consulted for history of dysphagia, which has been nonprogressive for several years. Patient underwent a barium swallow on 03/20/2022 which showed minimal barium passed into the stomach with distal esophageal narrowing consistent with stricture, over the course of approximately 10 minutes the barium remained in esophagus and tertiary contractions seen throughout the study with barium refluxing back-and-forth within the esophagus.  As per patient and his son who I spoke with over the phone, Dr. Arloa Koh, [phone], difficulty swallowing appears to be nonprogressive.  In fact he has been eating foods such as dumplings, chicken without symptoms of esophageal impaction, coughing or pain. In the past when patient has had endoscopies, it was deemed that patient did not have an obvious ring or stricture but rather tertiary contractions causing symptoms of dysphagia for which she has had empiric dilations without much benefit in clinical symptoms. Last EGD on 10/2020 did not show any obvious stricture and no CBD dilation was performed empirically.   Past Medical History:  Diagnosis Date   Anxiety    Arthritis    "in the fingers" per pt   Atrial fibrillation ([place])    Bladder neck contracture    Dysrhythmia    A-FIB / HX BRADYCARDIA - FOLLOWED BY DR. [place] DUE TO [name]   Gross hematuria    History of gout    History of prostate cancer    S/P RADIOACTIVE SEED IMPLANTS   History of squamous cell carcinoma of skin    EXCISION LOWER LIP AND RIGHT EAR   History of TIA (transient ischemic attack)    probable tia no residual   09-09-2012   Hyperlipidemia    Hypertension    Macular degeneration    both eyes   Urethral stricture    Urinary retention     Past Surgical History:  Procedure Laterality Date   APPENDECTOMY     CATARACT EXTRACTION W/ INTRAOCULAR LENS  IMPLANT, BILATERAL     CHOLECYSTECTOMY  1977   CYSTOSCOPY WITH URETHRAL DILATATION N/A 02/12/2013   Procedure: CYSTOSCOPY WITH BALLOON DILATATION OF URETHRAL STRICTURE AND BLADDER FULGERATION;  Surgeon: Bernestine Amass, MD;  Location: [facility];  Service: Urology;  Laterality: N/A;   ESOPHAGOGASTRODUODENOSCOPY (EGD) WITH PROPOFOL N/A 11/02/2020   Procedure: ESOPHAGOGASTRODUODENOSCOPY (EGD) WITH PROPOFOL;  Surgeon: Clarene Essex, MD;  Location: [facility];  Service: Endoscopy;  Laterality: N/A;   LAPAROSCOPIC APPENDECTOMY N/A 08/22/2013   Procedure: APPENDECTOMY LAPAROSCOPIC;  Surgeon: Pedro Earls, MD;  Location: [facility];  Service: General;  Laterality: N/A;   Naomi N/A 11/02/2020   Procedure: SAVORY DILATION;  Surgeon: Clarene Essex, MD;  Location: [facility];  Service: Endoscopy;  Laterality: N/A;   TONSILLECTOMY  as child   TOTAL KNEE ARTHROPLASTY Right 11-23-2004    Prior to Admission medications   Medication Sig Start Date End Date Taking? Authorizing Provider  acetaminophen (TYLENOL) 500 MG tablet Take 1,000 mg by mouth in the morning and at bedtime.    [provider]  ELIQUIS 5 MG TABS tablet Take 1 tablet (5 mg total) by mouth 2 (two) times daily. 11/21/21   Belva Crome, MD  Multiple Vitamins-Minerals (OCUVITE PRESERVISION PO) Take 1 tablet by mouth every morning.     [provider]  oxyCODONE (OXY IR/ROXICODONE) 5 MG immediate release tablet Take 0.5 tablets (2.5 mg total) by mouth every 4 (four) hours as needed for moderate pain. 03/14/22   Danford, Suann Larry, MD  Pediatric Multiple Vit-C-FA (ANIMAL CHEWABLE MULTIVITAMIN PO) Take 1 tablet by mouth daily.    [provider]  polyethylene glycol (MIRALAX / GLYCOLAX) 17 g packet Take 17 g by mouth daily. 03/15/22   Danford, Suann Larry, MD    Current Facility-Administered Medications  Medication Dose Route Frequency Provider Last Rate Last Admin   acetaminophen (TYLENOL) tablet 650 mg  650 mg Oral Q6H PRN Cathlyn Parsons, PA-C   650 mg at 03/20/22 2250   Or   acetaminophen (TYLENOL) suppository 650 mg  650 mg Rectal Q6H PRN Angiulli, Lavon Paganini, PA-C       apixaban (ELIQUIS) tablet 5 mg  5 mg Oral BID Cathlyn Parsons, PA-C   5 mg at 03/21/22 1015   multivitamin (PROSIGHT) tablet 1 tablet  1 tablet Oral Daily Pham, Minh Q, RPH-CPP   1 tablet at 03/21/22 1015   polyethylene glycol (MIRALAX / GLYCOLAX) packet 17 g  17 g Oral Daily Cathlyn Parsons, PA-C   17 g at 03/21/22 1016    Allergies as of 03/14/2022   (No Known Allergies)    Family History  Problem Relation Age of Onset   Cancer Mother    Cancer Father     Social History   Socioeconomic History   Marital status: Married    Spouse name: Not on file   Number of children: Not on file   Years of education: Not on file   Highest education level: Not on file  Occupational History   Not on file  Tobacco Use   Smoking status: Former    Packs/day: 1.50    Years: 35.00    Total pack years: 52.50    Types: Cigarettes    Quit date: 01/30/1979    Years since quitting: 43.1   Smokeless tobacco: Never  Vaping Use   Vaping Use: Never used  Substance and Sexual Activity   Alcohol use: Yes    Comment: "5 days per week I drink 1 glass of wine"   Drug use: No   Sexual activity: Not Currently  Other Topics Concern   Not on file  Social History Narrative   Not on file   Social Determinants of Health   Financial Resource Strain: Not on file  Food Insecurity: No Food Insecurity (01/17/2021)   Hunger Vital Sign    Worried About Running Out of Food in the Last Year: Never true    Ran Out of Food in the Last Year: Never true   Transportation Needs: No Transportation Needs (01/17/2021)   PRAPARE - Hydrologist (Medical): No    Lack of Transportation (Non-Medical): No  Physical Activity: Not on file  Stress: Not on file  Social Connections: Not on file  Intimate Partner Violence: Not on file    Review of Systems: As per HPI  Physical Exam: Vital signs in last 24 hours: Temp:  [97.8 F (36.6 C)-98.6 F (37 C)] 98.6 F (37 C) (08/29 0553) Pulse Rate:  [48-61] 48 (08/29 0553) Resp:  [16-18] 16 (08/29 0553) BP: (115-152)/(56-77) 152/59 (08/29 0553) SpO2:  [99 %-100 %] 99 % (08/29 0553) Last BM Date : 03/19/22  General:   Elderly, not in distress, well-developed  Head:  Normocephalic and atraumatic. Eyes:  Sclera clear, no icterus.  Mild pallor  Ears:  Normal auditory acuity. Nose:  No deformity, discharge,  or lesions. Mouth:  No deformity or lesions.  Oropharynx pink & moist. Neck:  Supple; no masses or thyromegaly. Lungs:  Clear throughout to auscultation.   No wheezes, crackles, or rhonchi. No acute distress. Heart:  Regular rate and rhythm; no murmurs, clicks, rubs,  or gallops. Extremities:  Without clubbing or edema. Neurologic:  Alert and  oriented x4;  grossly normal neurologically. Skin:  Intact without significant lesions or rashes. Psych:  Alert and cooperative. Normal mood and affect. Abdomen:  Soft, nontender and nondistended. No masses, hepatosplenomegaly or hernias noted. Normal bowel sounds, without guarding, and without rebound.         Lab Results: Recent Labs    03/20/22 0723  WBC 8.9  HGB 9.0*  HCT 28.0*  PLT 367   BMET Recent Labs    03/20/22 0723  NA 140  K 4.0  CL 107  CO2 27  GLUCOSE 110*  BUN 25*  CREATININE 0.79  CALCIUM 8.6*   LFT No results for input(s): "PROT", "ALBUMIN", "AST", "ALT", "ALKPHOS", "BILITOT", "BILIDIR", "IBILI" in the last 72 hours. PT/INR No results for input(s): "LABPROT", "INR" in the last 72  hours.  Studies/Results: DG ESOPHAGUS W SINGLE CM (SOL OR THIN BA)  Result Date: 03/20/2022 CLINICAL DATA:  Patient with a history of dysphagia. Prior history of esophageal stricture with dilation. EXAM: ESOPHAGUS/BARIUM SWALLOW/TABLET STUDY TECHNIQUE: Single contrast examination was performed using thin liquid barium. This exam was performed by Soyla Dryer, NP, and was supervised and interpreted by Dr. Nyoka Cowden. FLUOROSCOPY: Radiation Exposure Index (as provided by the fluoroscopic device): 13.7 mGy Kerma COMPARISON:  DG esophagus 07/15/2018 FINDINGS: Swallowing: Appears normal. No vestibular penetration or aspiration seen. Pharynx: Unremarkable. Esophagus: Distal esophageal narrowing, possible stricture. Esophageal motility: Poor motility with tertiary contractions Hiatal Hernia: Not observed/not tested Gastroesophageal reflux: Not observed/not tested Ingested 22m barium tablet: Not given Other: None. IMPRESSION: Limited exam due to patient immobility and pain in his hip. Patient swallowed barium easily; however, minimal barium passed into the stomach. Distal esophageal narrowing observed consistent with stricture. Over the course of approximately 10 minutes the barium remained in the esophagus. Tertiary contractions can be seen throughout the study with barium refluxing back and forth within the esophagus. read by: JSoyla Dryer NP Electronically Signed   By: JMarijo ConceptionM.D.   On: 03/20/2022 15:22    Impression: Dysphagia, concern for distal esophageal stricture 11 x 5.6 x 11.4 cm right large intramuscular hematoma over the gluteus maximus  History of A-fib on Eliquis History of stroke Multiple comorbidities-history of prostate cancer with radioactive seed implant, bradycardia, macular degeneration, hypertension, dyslipidemia, BPH/urinary retention  Plan: I had a detailed discussion with patient's son, Dr. RArloa Kohover the phone regarding patient's barium swallow findings. Since  patient clinically is able to tolerate solids as well as liquids and in the past his prior endoscopies have not shown obvious ring or stricture but rather tertiary contractions, it is thought that the findings of barium swallow is not reflective of a true stricture but rather due to tertiary contractions and age-related findings. Also, he is on Eliquis which will need to be on hold if an endoscopy with dilation is considered.  On holding Eliquis, patient remains at risk of stroke which can be life-threatening considering his age. As such,  we have decided to not pursue endoscopy/balloon dilation unless there is an absolute indication such as symptoms of esophageal food bolus impaction. If there is need for urgent/emergent EGD, Eliquis will need to be on hold for at least 24 hours and IV heparin can be used up until 6 hours prior to procedure for bridging and to decrease risk of stroke.   LOS: 7 days   Ronnette Juniper, MD  03/21/2022, 10:27 AM

## 2022-03-21 NOTE — Evaluation (Addendum)
Speech Language Pathology Assessment and Plan  Patient Details  Name: Justin Potter MRN: [id number] Date of Birth: [birth year]  Today's Date: 03/21/2022 SLP Individual Time: 0935-1015 SLP Individual Time Calculation (min): 40 min  Hospital Problem: Principal Problem:   Intramuscular hematoma  Past Medical History:  Past Medical History:  Diagnosis Date   Anxiety    Arthritis    "in the fingers" per pt   Atrial fibrillation ([place])    Bladder neck contracture    Dysrhythmia    A-FIB / HX BRADYCARDIA - FOLLOWED BY DR. [place] DUE TO [name]   Gross hematuria    History of gout    History of prostate cancer    S/P RADIOACTIVE SEED IMPLANTS   History of squamous cell carcinoma of skin    EXCISION LOWER LIP AND RIGHT EAR   History of TIA (transient ischemic attack)    probable tia no residual  09-09-2012   Hyperlipidemia    Hypertension    Macular degeneration    both eyes   Urethral stricture    Urinary retention    Past Surgical History:  Past Surgical History:  Procedure Laterality Date   APPENDECTOMY     CATARACT EXTRACTION W/ INTRAOCULAR LENS  IMPLANT, BILATERAL     CHOLECYSTECTOMY  1977   CYSTOSCOPY WITH URETHRAL DILATATION N/A 02/12/2013   Procedure: CYSTOSCOPY WITH BALLOON DILATATION OF URETHRAL STRICTURE AND BLADDER FULGERATION;  Surgeon: Bernestine Amass, MD;  Location: [facility];  Service: Urology;  Laterality: N/A;   ESOPHAGOGASTRODUODENOSCOPY (EGD) WITH PROPOFOL N/A 11/02/2020   Procedure: ESOPHAGOGASTRODUODENOSCOPY (EGD) WITH PROPOFOL;  Surgeon: Clarene Essex, MD;  Location: [facility];  Service: Endoscopy;  Laterality: N/A;   LAPAROSCOPIC APPENDECTOMY N/A 08/22/2013   Procedure: APPENDECTOMY LAPAROSCOPIC;  Surgeon: Pedro Earls, MD;  Location: [facility];  Service: General;  Laterality: N/A;   [place] N/A 11/02/2020   Procedure: SAVORY DILATION;  Surgeon: Clarene Essex, MD;  Location: [facility];  Service: Endoscopy;  Laterality: N/A;   TONSILLECTOMY  as child   TOTAL KNEE ARTHROPLASTY Right 11-23-2004    Assessment / Plan / Recommendation Clinical Impression Justin Potter is a 88 year old right-handed male with history of prostate cancer with radioactive seed implant, atrial fibrillation on Eliquis, persistent bradycardia, TIA, macular degeneration, hypertension, hyperlipidemia, BPH/urinary retention.  Per chart review patient resides [facility].  1 level apartment.  Ambulates independently with a rollator.  Presented 03/12/2022 with increasing right hip pain after a fall 6 days ago as well as hypotensive and bradycardic.  On 03/09/2022 he went to [facility] had a CT of hip done which showed some swelling and no fracture but noted some soft tissue edema and some ill-defined fluid within the subcutaneous tissue overlying the posterior lateral aspect of the right hip.  He was given no pain medications at that time and returned home.  Patient noted increasing pain to her right hip.  CT of right hip follow-up showed large intramuscular hematoma in the right gluteus maximus muscle therapy evaluations completed due to patient decreased functional mobility was admitted for a comprehensive rehab program.  Pt seen for BSE secondary to report that sometimes food does not "go down well" per Dr. Letta Pate. Per chart review, pt presents with history of esophageal dysphagia. Last EGD on 10/2020. Pt underwent a barium swallow on 03/20/2022 which showed "minimal barium passed into the stomach with distal esophageal narrowing consistent with  stricture, over the course of approximately 10 minutes the barium remained in esophagus and tertiary contractions seen throughout the study with barium refluxing back-and-forth within the esophagus." Son does NOT wish to pursue another endoscopy with dilation unless there is an absolute indication per Dr. Therisa Doyne.    Based on today's  findings, pt's oropharyngeal swallow function appears WFL and symptoms of belching and referred sensation appear primarily esophageal in nature. Pt was able to tolerate both regular solids and thin liquids with functional oral phase, no s/sx concerning for pharyngeal dysphagia nor airway invasion. A liquid rinse was effective in improving globus sensation noted in sternum/mid chest region. Pt reports sensation of slower pharyngeal/esophageal clearance at times however denies any coughing or choking incidences and prefers to maintain a regular texture diet with thin liquids at this time. Continue whole meds with liquid. Full supervision still needed for feeding assistance. Would consider consumption of softer foods as needed from a GI standpoint. SLP educated on general swallowing and reflux/GERD precautions including small bites/sips, alternating between bites/sips, upright positioning with intake, and remaining upright 30-60 minutes following meals to aid in esophageal clearing. Follow up services from an oropharyngeal standpoint do not appear clinically indicated at this time. Please re-consult as needed. SLP educated pt who verbalized understanding and agreement.    Skilled Therapeutic Interventions          BSE completed. Please see above.  SLP Assessment  Patient does not need any further Speech Lanaguage Pathology Services    Recommendations  Recommended Consults: Consider esophageal assessment SLP Diet Recommendations: Age appropriate regular solids;Thin Liquid Administration via: Cup;Straw Medication Administration: Whole meds with liquid Supervision: Staff to assist with self feeding;Full supervision/cueing for compensatory strategies;Trained caregiver to feed patient Compensations: Slow rate;Small sips/bites;Follow solids with liquid Postural Changes and/or Swallow Maneuvers: Upright 30-60 min after meal;Seated upright 90 degrees Oral Care Recommendations: Oral care BID Patient destination:  Assisted Living (ILF) Follow up Recommendations: None Equipment Recommended: None recommended by SLP           Pain Pain Assessment Pain Score: 2  Pain Type: Acute pain Pain Location: Leg Pain Orientation: Right Pain Descriptors / Indicators: Aching Pain Onset: On-going Pain Intervention(s): Rest  Prior Functioning Cognitive/Linguistic Baseline: Information not available Type of Home: Independent living facility  Lives With: Alone Available Help at Discharge: Family;Other (Comment) (son involved in care)  SLP Evaluation Oral Motor Oral Motor/Sensory Function Overall Oral Motor/Sensory Function: Within functional limits Motor Speech Overall Motor Speech: Appears within functional limits for tasks assessed Respiration: Within functional limits Phonation: Low vocal intensity Resonance: Within functional limits Articulation: Within functional limitis Intelligibility: Intelligible Motor Planning: Witnin functional limits Motor Speech Errors: Not applicable  Care Tool Care Tool Cognition Ability to hear (with hearing aid or hearing appliances if normally used Ability to hear (with hearing aid or hearing appliances if normally used): 1. Minimal difficulty - difficulty in some environments (e.g. when person speaks softly or setting is noisy)   Expression of Ideas and Wants Expression of Ideas and Wants: 4. Without difficulty (complex and basic) - expresses complex messages without difficulty and with speech that is clear and easy to understand   Understanding Verbal and Non-Verbal Content Understanding Verbal and Non-Verbal Content: 4. Understands (complex and basic) - clear comprehension without cues or repetitions  Memory/Recall Ability Memory/Recall Ability : Current season;That he or she is in a hospital/hospital unit   PMSV Assessment  PMSV Trial Intelligibility: Intelligible  Bedside Swallowing Assessment General Date of Onset: 03/20/22 Previous  Swallow Assessment:  NA Diet Prior to this Study: Regular;Thin liquids Temperature Spikes Noted: No Respiratory Status: Room air History of Recent Intubation: No Behavior/Cognition: Alert;Cooperative;Pleasant mood Oral Cavity - Dentition: Missing dentition Self-Feeding Abilities: Needs assist (assist needed d/t vision) Baseline Vocal Quality: Low vocal intensity Volitional Cough: Strong Volitional Swallow: Able to elicit  Oral Care Assessment Oral Assessment  (WDL): Exceptions to WDL Lips: Symmetrical Teeth: Missing (Comment) Tongue: Pink;Moist (small growth on right side. Pt reports chronic and primary physician is aware) Mucous Membrane(s): Moist;Pink Saliva: Moist, saliva free flowing Level of Consciousness: Alert Is patient on any of following O2 devices?: None of the above Nutritional status: Dependent feeder Oral Assessment Risk : High Risk Ice Chips Ice chips: Not tested Thin Liquid Thin Liquid: Within functional limits Presentation: Cup;Straw Nectar Thick Nectar Thick Liquid: Not tested Honey Thick Honey Thick Liquid: Not tested Puree Puree: Within functional limits Presentation: Spoon Solid Solid: Within functional limits BSE Assessment Suspected Esophageal Findings Suspected Esophageal Findings: Belching;Globus sensation Risk for Aspiration Impact on safety and function: Mild aspiration risk Other Related Risk Factors: History of esophageal-related issues  Recommendations for other services: None   Discharge Criteria: Patient will be discharged from SLP if patient refuses treatment 3 consecutive times without medical reason, if treatment goals not met, if there is a change in medical status, if patient makes no progress towards goals or if patient is discharged from hospital.  The above assessment, treatment plan, treatment alternatives and goals were discussed and mutually agreed upon: by patient  Patty Sermons 03/21/2022, 1:23 PM

## 2022-03-21 NOTE — Progress Notes (Signed)
PROGRESS NOTE   Subjective/Complaints:  No issues overnite but having problem with feeding and swallowing.  reviewed esophagram , pt does not recall having his esophagus stretched in the past   ROS- neg CP, SOB,  N/V/D, abdominal pain, HA  Objective:   DG ESOPHAGUS W SINGLE CM (SOL OR THIN BA)  Result Date: 03/20/2022 CLINICAL DATA:  Patient with a history of dysphagia. Prior history of esophageal stricture with dilation. EXAM: ESOPHAGUS/BARIUM SWALLOW/TABLET STUDY TECHNIQUE: Single contrast examination was performed using thin liquid barium. This exam was performed by Soyla Dryer, NP, and was supervised and interpreted by Dr. Nyoka Cowden. FLUOROSCOPY: Radiation Exposure Index (as provided by the fluoroscopic device): 13.7 mGy Kerma COMPARISON:  DG esophagus 07/15/2018 FINDINGS: Swallowing: Appears normal. No vestibular penetration or aspiration seen. Pharynx: Unremarkable. Esophagus: Distal esophageal narrowing, possible stricture. Esophageal motility: Poor motility with tertiary contractions Hiatal Hernia: Not observed/not tested Gastroesophageal reflux: Not observed/not tested Ingested 29m barium tablet: Not given Other: None. IMPRESSION: Limited exam due to patient immobility and pain in his hip. Patient swallowed barium easily; however, minimal barium passed into the stomach. Distal esophageal narrowing observed consistent with stricture. Over the course of approximately 10 minutes the barium remained in the esophagus. Tertiary contractions can be seen throughout the study with barium refluxing back and forth within the esophagus. read by: JSoyla Dryer NP Electronically Signed   By: JMarijo ConceptionM.D.   On: 03/20/2022 15:22   Recent Labs    03/20/22 0723  WBC 8.9  HGB 9.0*  HCT 28.0*  PLT 367    Recent Labs    03/20/22 0723  NA 140  K 4.0  CL 107  CO2 27  GLUCOSE 110*  BUN 25*  CREATININE 0.79  CALCIUM 8.6*      Intake/Output Summary (Last 24 hours) at 03/21/2022 0819 Last data filed at 03/21/2022 0235 Gross per 24 hour  Intake --  Output 125 ml  Net -125 ml         Physical Exam: Vital Signs Blood pressure (!) 152/59, pulse (!) 48, temperature 98.6 F (37 C), resp. rate 16, height '6\' 2"'$  (1.88 m), weight 81.6 kg, SpO2 99 %.  General: No acute distress, in chair eating lunch Mood and affect are appropriate, pleasant Heart: Regular rate and rhythm no rubs murmurs or extra sounds Lungs: Clear to auscultation, breathing unlabored, no rales or wheezes Abdomen: Positive bowel sounds, soft nontender to palpation, nondistended Extremities: No clubbing, cyanosis, or edema Skin: No evidence of breakdown, no evidence of rash Neurologic: Alert and oriented x4, answers president correctly, answers age correctly, Follows commands, Cranial nerves II through XII intact, motor strength is 5/5 in bilateral deltoid, bicep, tricep, grip, Left hip flexor, knee extensors, ankle dorsiflexor and plantar flexor RLE 3- proximal muscle due to hip/buttocks pain Sensory exam normal sensation to light touch ain bilateral upper and lower extremities  Musculoskeletal: reduced R hip abd due to pain , large ecchymosis Right buttocks    Assessment/Plan: 1. Functional deficits which require 3+ hours per day of interdisciplinary therapy in a comprehensive inpatient rehab setting. Physiatrist is providing close team supervision and 24 hour management of active medical problems listed below.  Physiatrist and rehab team continue to assess barriers to discharge/monitor patient progress toward functional and medical goals  Care Tool:  Bathing    Body parts bathed by patient: Right arm, Left arm, Abdomen, Chest, Right upper leg, Left upper leg, Face   Body parts bathed by helper: Right lower leg, Left lower leg, Front perineal area     Bathing assist Assist Level: Moderate Assistance - Patient 50 - 74%     Upper  Body Dressing/Undressing Upper body dressing   What is the patient wearing?: Pull over shirt    Upper body assist Assist Level: Minimal Assistance - Patient > 75%    Lower Body Dressing/Undressing Lower body dressing      What is the patient wearing?: Pants     Lower body assist Assist for lower body dressing: Maximal Assistance - Patient 25 - 49%     Toileting Toileting    Toileting assist Assist for toileting: Moderate Assistance - Patient 50 - 74%     Transfers Chair/bed transfer  Transfers assist  Chair/bed transfer activity did not occur: Safety/medical concerns  Chair/bed transfer assist level: Moderate Assistance - Patient 50 - 74%     Locomotion Ambulation   Ambulation assist      Assist level: Minimal Assistance - Patient > 75% Assistive device: Walker-rolling Max distance: 15   Walk 10 feet activity   Assist     Assist level: Minimal Assistance - Patient > 75% Assistive device: Walker-rolling   Walk 50 feet activity   Assist Walk 50 feet with 2 turns activity did not occur: Safety/medical concerns         Walk 150 feet activity   Assist Walk 150 feet activity did not occur: Safety/medical concerns         Walk 10 feet on uneven surface  activity   Assist Walk 10 feet on uneven surfaces activity did not occur: Safety/medical concerns         Wheelchair     Assist Is the patient using a wheelchair?: Yes Type of Wheelchair: Manual    Wheelchair assist level: Supervision/Verbal cueing Max wheelchair distance: 150    Wheelchair 50 feet with 2 turns activity    Assist        Assist Level: Supervision/Verbal cueing   Wheelchair 150 feet activity     Assist      Assist Level: Supervision/Verbal cueing   Blood pressure (!) 152/59, pulse (!) 48, temperature 98.6 F (37 C), resp. rate 16, height '6\' 2"'$  (1.88 m), weight 81.6 kg, SpO2 99 %.  Medical Problem List and Plan: 1. Functional deficits  secondary to large intramuscular hematoma right gluteus maximus muscle measuring at least 11 x 5.6 x 11.4 cm after fall             -patient may  shower             -ELOS/Goals: 7-14 days, with dispo home to independent living-               - Excellent dispo with independent living facility, has nursing aide and PT/OT services available as needed. Son (retired Scientist, clinical (histocompatibility and immunogenetics)) lives 20-30 min away and available for assistance.               - Ambulates with 4 wheeled walked Mod I at baseline; Ind ADLs 2.  Antithrombotics: -DVT/anticoagulation:  Pharmaceutical: Eliquis - resumed Monday 8/21; HgB stable at ~8     Latest Ref Rng & Units 03/20/2022  7:23 AM 03/18/2022    6:31 AM 03/17/2022    6:27 AM  CBC  WBC 4.0 - 10.5 K/uL 8.9   8.8   Hemoglobin 13.0 - 17.0 g/dL 9.0  8.1  8.1   Hematocrit 39.0 - 52.0 % 28.0  25.0  25.2   Platelets 150 - 400 K/uL 367   256                -antiplatelet therapy: N/A 3. Pain Management: Low-dose oxycodone as needed- has no rest pain but pain with movement such as rolling               - Takes Tylenol 1000 mg BID at baseline for chronic back pain  -Right glute pain controled with current medications  -Will DC oxycodone, continue tylenol for now  -Pain controlled today 4. Mood/Behavior/Sleep: Provide emotional support             -antipsychotic agents: N/A 5. Neuropsych/cognition: This patient is capable of making decisions on his own behalf. 6. Skin/Wound Care: Routine skin checks                            - Sees podiatry for foot care as OP 7. Fluids/Electrolytes/Nutrition: Routine in and out with follow-up chemistry               - Diet advancing from full liquid 8/22- appears to be tolerating diet 8.  Acute blood loss anemia related hematoma.  - Cont to monitor, recheck in am will need outpt CBC ~1wk post d/c  -HBG stable at 8.1 on 03/18/22                - Monitor hematoma closely for s/s enlargement 9.  Atrial fibrillation as well as  bradycardia.  Eliquis resumed.  Follow-up with EP cardiology team.  No current plans for pacer                - Irregular HR but stable rate, monitor    - Mild Loletha Grayer today 8/25, asymptomatic, continue to monitor  -Currently pulse 68, occasional brady, asymptomatic 10.  History of prostate cancer with radioactive seed implant.  Follow-up outpatient                - Baseline urinary incontinent with pads at nighttime and briefs during daytime 11.  Macular degeneration.  Resume Ocuvite.                - Pt legally blind, sees "shadows" only beyond 12 in, but can   navigate hallways and performs ADLs independently at baseline   12. Hx cardioembolic stroke d/t Afib, no residual deficits 13. Mild confusion-imporved, Pt alert and oriented x4, suspect related to oxycodone dose-will discontinue for now and monitor  -resolved    14.  Dysphagia, sounds like esophageal phase check Ba++ esophagram, SLP eval , up in chair for meals  Ask GI to eval . Discussed with pt's sone who is MD and reviewed prior records of EGD, pt had EGD April 2022, showing presbyesophagus no true sstricture but prior EGD in 2019 did demonstrate stricture, had small parietal CVA after EGD in 2022, need to carefully consider risk prior to potential repeat procedure if Eliquis needs to be stopped  LOS: 7 days A FACE TO FACE EVALUATION WAS PERFORMED  Charlett Blake 03/21/2022, 8:19 AM

## 2022-03-21 NOTE — Progress Notes (Signed)
Occupational Therapy Session Note  Patient Details  Name: Justin Potter MRN: [id number] Date of Birth: [birth year]  Today's Date: 03/21/2022 OT Individual Time: 1045-1200 OT Individual Time Calculation (min): 75 min    Short Term Goals: Week 1:  OT Short Term Goal 1 (Week 1): Pt will complete 1/3 toileting steps with no more than min A for balance OT Short Term Goal 2 (Week 1): Pt will using AE PRN to dress LB with min A OT Short Term Goal 3 (Week 1): Pt will complete toilet transfer with CGA using LRAD Week 2:     Skilled Therapeutic Interventions/Progress Updates:    Pt received in recliner.  Pt seen this session to problem solve techniques for donning LB clothing.  Initially pt worked on forward hip flexion in seated to work towards increased hip ROM tolerance. Pt is able to reach to about mid shin level prior to hip pain.  Pt worked on various strategies to [employer], experimenting with using a reacher then a shoe horn to pull waist band of pants and trialing R vs L leg first. The reacher is challenging for him to manage with his visual impairments. He also seemed to have slower processing and problem solving skills today. For example, he needed several cues on how to find the front of his shorts.  Pt was an excellent participant as he was will trying donning and doffing shorts over feet numerous times.  Our final conclusion was it was easiest for him to bend forward and put his L leg in first, he then "dropped" the pants to floor, then used the hook of the shoe horn to pull the shorts out to the side to then lift R foot slightly to put into the shorts.    At home, he will most likely need A with his compression hose but will trial a sock aid tomorrow.   For his shoes, the tongue of the shoe kept getting pushed down as he put his feet in.  If the tongue is looped under the first velcro strap then pt can put his foot in using the shoe horn and then he was able to pull tongue through.     Pt then stood up from low recliner with min A then ambulated to sink to brush teeth. Pt initially hunched forward and needed mod cues to stand upright to increase his balance safety.  Pt completed oral care and then stepped backwards with RW towards recliner. Min A to sit into low seat to control his decent.    Pt resting in recliner with seat alarm on and all needs met.   Call light in reach.   Therapy Documentation Precautions:  Precautions Precautions: Fall Restrictions Weight Bearing Restrictions: Yes RLE Weight Bearing: Weight bearing as tolerated Other Position/Activity Restrictions: legally blind  Pain: Pain Assessment Pain Score: 2  Pain Type: Acute pain Pain Location: Leg Pain Orientation: Right Pain Descriptors / Indicators: Aching Pain Onset: On-going Pain Intervention(s): Rest ADL: ADL Eating: Set up Where Assessed-Eating: Bed level Grooming: Setup (Pt able to complete oral care and facial hygiene with set-up assistance while seated inthe w/c at the sink. Pt does require minimal assistance for thoroughness with shaving face due to vision deficits.) Where Assessed-Grooming: Wheelchair, Sitting at sink Upper Body Bathing: Supervision/safety (Pt able to bathe all UB parts with supervision while seated in the tub-bench in the shower. Pt requires assistance to retrieve washcloths and sequence due to decreased vision.) Where Assessed-Upper Body Bathing: Shower Lower  Body Bathing: Minimal assistance (for A with bottom.  Used LH sponge for feet.) Where Assessed-Lower Body Bathing: Shower Upper Body Dressing: Setup Where Assessed-Upper Body Dressing: Wheelchair Lower Body Dressing: Maximal assistance Where Assessed-Lower Body Dressing: Wheelchair Toileting: Moderate assistance Where Assessed-Toileting: Glass blower/designer: Therapist, music Method: Counselling psychologist: Grab bars, Raised toilet seat Tub/Shower Transfer: Unable to  assess Tub/Shower Transfer Method: Unable to assess Social research officer, government: Curator Method: Heritage manager: Grab bars, Transfer tub bench ADL Comments: Pt required increased A with LB dressing due to visual impairments and difficulty fully flexing at hip.  Reacher difficulty for him to use due to low vision.   Therapy/Group: Individual Therapy  [place] 03/21/2022, 12:23 PM

## 2022-03-21 NOTE — Progress Notes (Signed)
Physical Therapy Session Note  Patient Details  Name: Justin Potter MRN: [id number] Date of Birth: 1926/04/30  Today's Date: 03/21/2022 PT Individual Time: 1445-1559 PT Individual Time Calculation (min): 74 min   Short Term Goals: Week 1:  PT Short Term Goal 1 (Week 1): Pt will perform bed mobility with min assist PT Short Term Goal 2 (Week 1): Pt will ambulate 30f with min assist PT Short Term Goal 3 (Week 1): Pt will improve TUG to < 284m   Skilled Therapeutic Interventions/Progress Updates:  Patient seated upright in recliner on entrance to room. Patient alert and agreeable to PT session.   Patient with no pain complaint at start of session but is very confused relating he just woke up and is sitting in recliner but doesn't know where he's supposed to be. Pt knows he is in the hospital and in his room, Oriented pt to time of afternoon and having one more session of therapy to complete this day. Pt appreciative of orienting and willing to participate. Confusion improves throughout session.   Therapeutic Activity: Transfers: Pt performed sit<>stand and stand pivot transfers throughout session with light MinA initially and improving to CGA/ supervision by end of session with improved foot placement and forward weight shift over feet.  Provided verbal cues for improved positioning following initial transfer with good results from pt.  Gait Training:  Pt ambulated 100' x1/ 853x1 using RW with CGA. Demonstrated flexed postural positioning and slow pace with decreased step height/ length. Provided vc/ tc for upright posture, improved proximity to RW, level gaze, and heel strike. Improved quality of posture until increasing fatigue and pt desire to sit.   Neuromuscular Re-ed: NMR facilitated during session with focus on standing balance. Pt guided in static reaching activities without AD. Pt guided in AP UE movements from chest holding 1# soft weighted ball with both hands. Progressed to  "pot-stirrers" x10 in CW/ CCW.   Progressed to pushing ball overhead and bringing ball down to "airplane" position with ball in one hand, then overhead pass of ball to other hand. Completes x8.   Pt then progressed to bil handhold to 1# ball and touch to cones placed outside of BOS diagonally out infront of pt to each side. Requires CGA to maintain balance, but pt cognizant of balance and performs with no LOB. Seated rest breaks provided between bouts described above.   NMR performed for improvements in motor control and coordination, balance, sequencing, judgement, and self confidence/ efficacy in performing all aspects of mobility at highest level of independence.   Patient seated in w/c at end of session with brakes locked, belt alarm set, and all needs within reach. Pt relates that he believes he will be able to sit upright in w/c throughout dinner and then return to bed after eating.    Therapy Documentation Precautions:  Precautions Precautions: Fall Restrictions Weight Bearing Restrictions: Yes RLE Weight Bearing: Weight bearing as tolerated Other Position/Activity Restrictions: legally blind General:   Vital Signs:   Pain:  No pain related this session, but pt does have continued fatigue and some confusion following swallow study yesterday.   Therapy/Group: Individual Therapy  JuAlger SimonsT, DPT, CSRS 03/21/2022, 6:45 PM

## 2022-03-22 NOTE — Plan of Care (Signed)
  Problem: RH Balance Goal: LTG Patient will maintain dynamic standing with ADLs (OT) Description: LTG:  Patient will maintain dynamic standing balance with assist during activities of daily living (OT)  Flowsheets (Taken 03/22/2022 1026) LTG: Pt will maintain dynamic standing balance during ADLs with: (LTG downgraded due to balance deficits and vision impairments.) Supervision/Verbal cueing Note: LTG downgraded due to balance deficits and vision impairments.   Problem: Sit to Stand Goal: LTG:  Patient will perform sit to stand in prep for activites of daily living with assistance level (OT) Description: LTG:  Patient will perform sit to stand in prep for activites of daily living with assistance level (OT) Flowsheets (Taken 03/22/2022 1026) LTG: PT will perform sit to stand in prep for activites of daily living with assistance level: (LTG downgraded due to balance deficits and vision impairments.) Supervision/Verbal cueing Note: LTG downgraded due to balance deficits and vision impairments.   Problem: RH Dressing Goal: LTG Patient will perform lower body dressing w/assist (OT) Description: LTG: Patient will perform lower body dressing with assist, with/without cues in positioning using equipment (OT) Flowsheets (Taken 03/22/2022 0954) LTG: Pt will perform lower body dressing with assistance level of: (LTG downgraded as pt will need A with compression socks and shoes) Minimal Assistance - Patient > 75% Note: LTG downgraded as pt will need A with compression socks and shoes   Problem: RH Toileting Goal: LTG Patient will perform toileting task (3/3 steps) with assistance level (OT) Description: LTG: Patient will perform toileting task (3/3 steps) with assistance level (OT)  Flowsheets (Taken 03/22/2022 1026) LTG: Pt will perform toileting task (3/3 steps) with assistance level: (LTG downgraded due to balance deficits and vision impairments.) Supervision/Verbal cueing Note: LTG downgraded due to  balance deficits and vision impairments.   Problem: RH Toilet Transfers Goal: LTG Patient will perform toilet transfers w/assist (OT) Description: LTG: Patient will perform toilet transfers with assist, with/without cues using equipment (OT) Flowsheets (Taken 03/22/2022 1026) LTG: Pt will perform toilet transfers with assistance level of: (LTG downgraded due to balance deficits and vision impairments.) Supervision/Verbal cueing Note: LTG downgraded due to balance deficits and vision impairments.   Problem: RH Tub/Shower Transfers Goal: LTG Patient will perform tub/shower transfers w/assist (OT) Description: LTG: Patient will perform tub/shower transfers with assist, with/without cues using equipment (OT) Flowsheets (Taken 03/22/2022 1026) LTG: Pt will perform tub/shower stall transfers with assistance level of: (LTG downgraded due to balance deficits and vision impairments.) -- Note: LTG downgraded due to balance deficits and vision impairments.

## 2022-03-22 NOTE — Plan of Care (Signed)
  Problem: RH Dressing Goal: LTG Patient will perform lower body dressing w/assist (OT) Description: LTG: Patient will perform lower body dressing with assist, with/without cues in positioning using equipment (OT) Flowsheets (Taken 03/22/2022 0954) LTG: Pt will perform lower body dressing with assistance level of: (LTG downgraded as pt will need A with compression socks and shoes) Minimal Assistance - Patient > 75% Note: LTG downgraded as pt will need A with compression socks and shoes

## 2022-03-22 NOTE — Discharge Summary (Signed)
Physician Discharge Summary  Patient ID: Justin Potter MRN: [id number] DOB/AGE: 86-Nov-1927 86 y.o.  Admit date: 03/14/2022 Discharge date: 03/27/2022  Discharge Diagnoses:  Principal Problem:   Intramuscular hematoma DVT prophylaxis Pain may regimen Acute blood loss anemia Atrial fibrillation History of prostate cancer Macular degeneration Cardioembolic CVA Dysphagia   Discharged Condition: Stable  Significant Diagnostic Studies: DG ESOPHAGUS W SINGLE CM (SOL OR THIN BA)  Result Date: 03/20/2022 CLINICAL DATA:  Patient with a history of dysphagia. Prior history of esophageal stricture with dilation. EXAM: ESOPHAGUS/BARIUM SWALLOW/TABLET STUDY TECHNIQUE: Single contrast examination was performed using thin liquid barium. This exam was performed by Soyla Dryer, NP, and was supervised and interpreted by Dr. Nyoka Cowden. FLUOROSCOPY: Radiation Exposure Index (as provided by the fluoroscopic device): 13.7 mGy Kerma COMPARISON:  DG esophagus 07/15/2018 FINDINGS: Swallowing: Appears normal. No vestibular penetration or aspiration seen. Pharynx: Unremarkable. Esophagus: Distal esophageal narrowing, possible stricture. Esophageal motility: Poor motility with tertiary contractions Hiatal Hernia: Not observed/not tested Gastroesophageal reflux: Not observed/not tested Ingested 64m barium tablet: Not given Other: None. IMPRESSION: Limited exam due to patient immobility and pain in his hip. Patient swallowed barium easily; however, minimal barium passed into the stomach. Distal esophageal narrowing observed consistent with stricture. Over the course of approximately 10 minutes the barium remained in the esophagus. Tertiary contractions can be seen throughout the study with barium refluxing back and forth within the esophagus. read by: JSoyla Dryer NP Electronically Signed   By: JMarijo ConceptionM.D.   On: 03/20/2022 15:22   CT HIP RIGHT W CONTRAST  Result Date: 03/12/2022 CLINICAL DATA:  Hip trauma,  pain EXAM: CT OF THE LOWER RIGHT EXTREMITY WITH CONTRAST TECHNIQUE: Multidetector CT imaging of the lower right extremity was performed according to the standard protocol following intravenous contrast administration. RADIATION DOSE REDUCTION: This exam was performed according to the departmental dose-optimization program which includes automated exposure control, adjustment of the mA and/or kV according to patient size and/or use of iterative reconstruction technique. CONTRAST:  1047mOMNIPAQUE IOHEXOL 300 MG/ML  SOLN COMPARISON:  12/07/2021 FINDINGS: Bones/Joint/Cartilage Generalized osteopenia. No acute fracture or dislocation. Normal alignment. Moderate osteoarthritis of the right hip. Mild osteoarthritis of the right SI joint. Severe bilateral facet arthropathy at L5-S1. No joint effusion. Ligaments Ligaments are suboptimally evaluated by CT. Muscles and Tendons No muscle atrophy. Large intramuscular hematoma in the right gluteus maximus muscle measuring at least 11 x 5.6 x 11.4 cm. Soft tissue edema in the subcutaneous fat overlying the right greater trochanter likely reflecting a soft tissue contusion. Soft tissue No fluid collection or hematoma. No soft tissue mass. Peripheral vascular atherosclerotic disease. Prostatic radiation seeds noted. IMPRESSION: 1. Large intramuscular hematoma in the right gluteus maximus muscle measuring at least 11 x 5.6 x 11.4 cm. Soft tissue contusion overlying the right greater trochanter. 2.  No acute osseous injury of the right hip. 3. Moderate osteoarthritis of the right hip. Electronically Signed   By: HeKathreen Devoid.D.   On: 03/12/2022 11:41   CT HIP RIGHT WO CONTRAST  Result Date: 03/09/2022 CLINICAL DATA:  Right hip pain after fall 3 days ago EXAM: CT OF THE RIGHT HIP WITHOUT CONTRAST TECHNIQUE: Multidetector CT imaging of the right hip was performed according to the standard protocol. Multiplanar CT image reconstructions were also generated. RADIATION DOSE  REDUCTION: This exam was performed according to the departmental dose-optimization program which includes automated exposure control, adjustment of the mA and/or kV according to patient size and/or use of iterative reconstruction  technique. COMPARISON:  CT 11/25/2018, 08/20/2013 FINDINGS: Bones/Joint/Cartilage No acute fracture. No dislocation. No evidence of femoral head avascular necrosis. Mild-moderate osteoarthritis of the right hip with joint space narrowing and marginal osteophyte formation. No appreciable hip joint effusion. Included portion of the right hemipelvis appears intact without evidence of fracture or diastasis. 13 mm sclerotic lesion in the supra-acetabular right ilium is stable from 2015, benign. No suspicious lytic or sclerotic bone lesion. Ligaments Suboptimally assessed by CT. Muscles and Tendons No acute musculotendinous abnormality by CT. Soft tissues Soft tissue edema and some ill-defined hyperdense fluid within the subcutaneous tissues overlying the posterolateral aspect of the right hip, likely posttraumatic. No well-defined hematoma. Brachytherapy seeds within the prostate bed. Colonic diverticulosis. Atherosclerotic vascular calcifications. No right inguinal lymphadenopathy. IMPRESSION: 1. No acute fracture or dislocation of the right hip. 2. Mild-moderate osteoarthritis of the right hip. 3. Soft tissue edema and some ill-defined fluid within the subcutaneous tissues overlying the posterolateral aspect of the right hip, likely posttraumatic. No well-defined hematoma. Electronically Signed   By: Davina Poke D.O.   On: 03/09/2022 15:56    Labs:  Basic Metabolic Panel: Recent Labs  Lab 03/26/22 1207  NA 139  K 4.4  CL 108  CO2 25  GLUCOSE 110*  BUN 21  CREATININE 0.90  CALCIUM 9.1    CBC: Recent Labs  Lab 03/26/22 1207  WBC 9.5  NEUTROABS 6.5  HGB 10.8*  HCT 33.8*  MCV 97.7  PLT 390    CBG: No results for input(s): "GLUCAP" in the last 168 hours.  Family  history.  Mother with cancer/unknown.  Denies any esophageal or rectal cancer  Brief HPI:   Justin Potter is a 88 y.o. right-handed male with history of prostate cancer radioactive seed implant atrial fibrillation on Eliquis, macular degeneration hypertension hyperlipidemia BPH.  Per chart review resides [facility].  Ambulates independent with a walker.  Presented 03/12/2022 with increasing right hip pain after a fall 6 days ago as well as hypotension and bradycardia.  On 03/09/2022 he went to [facility] had a CT of the hip done which showed some swelling and no fracture noted soft tissue edema and some ill-defined fluid within the subcutaneous tissue overlying the posterior lateral aspect of the right hip.  CT of the hip showed large intramuscular hematoma in the right gluteus maximus measuring 11 x 5.6 x 11.4 cm.  Soft tissue contusion.  No acute osseous injury.  Admission chemistries unremarkable except BUN 24 creatinine 11.1.  He did receive consultation follow-up by orthopedic services with noted conservative care.  His Eliquis was initially held due to hematoma since resumed.  He did receive followed by cardiology services for history of junctional bradycardia with heart rate approximately 50 bpm patient continued to be followed by EP currently no plan for pacer.  Hospital course anemia 8.8.  Therapy evaluations completed due to patient decreased functional mobility was admitted for a comprehensive rehab program.   Hospital Course: Justin Potter was admitted to rehab 03/14/2022 for inpatient therapies to consist of PT, ST and OT at least three hours five days a week. Past admission physiatrist, therapy team and rehab RN have worked together to provide customized collaborative inpatient rehab.  Pertaining to patient's large intramuscular hematoma right gluteus maximus muscle after recent fall remained stable conservative care per orthopedic services.  His chronic Eliquis for atrial  fibrillation had been resumed no bleeding episodes hemoglobin hematocrit remained stable.  Pain control with Tylenol oxycodone had been  discontinued monitoring of mental status.  History of prostate cancer radioactive seed implant follow-up outpatient.  Macular degeneration patient legally blind.  Noted dysphagia follow-up per gastroenterology services for possible stricture no current plan for intervention follow-up outpatient.   Blood pressures were monitored on TID basis and soft and monitored  Rehab course: During patient's stay in rehab weekly team conferences were held to monitor patient's progress, set goals and discuss barriers to discharge. At admission, patient required min assist sit to stand moderate assist supine to sit  Physical exam.  Blood pressure 154/80 pulse 59 temperature 97.6 respirations 20 oxygen saturations 100% room air Constitutional.  No acute distress HEENT Head.  Normocephalic and atraumatic Eyes.  Pupils round and reactive to light no discharge without nystagmus Neck.  Supple nontender no JVD without thyromegaly Cardiac regular rate and rhythm without any extra sounds or murmur heard Abdomen.  Soft nontender positive bowel sounds without rebound Respiratory effort normal no respiratory distress without wheeze Skin.  Right gluteal hematoma Musculoskeletal. Right upper extremity 5 -/5 SA 4/5 ADF/5 EE/5 WD 5 -/5 FF 5 -/5 FA Left upper extremity 5 -/5 SA 4/5 ADF/5 EE/5W EE 5 -/5 FF 5 -/5 FA Right lower extremity 3/5 HF/5 KE 5/5 DF 5/5 EHL 5/5 PF Left lower extremity 5/5 HF 5/5 KE 5/5 DF 5/5 EHL 5/5 PF Neurologic.  Alert no dysarthria follows commands.  He has had improvement in activity tolerance, balance, postural control as well as ability to compensate for deficits. He has had improvement in functional use RUE/LUE  and RLE/LLE as well as improvement in awareness.  Wheelchair mobility supervision ambulates 175 feet rolling walker contact-guard supervision.  Working  with energy conservation techniques.  Needing some assist for lower body ADLs.  Supervision upper body bathing and dressing.  Full family teaching completed plan discharged home       Disposition: Discharge to home  Diet: Soft  Special Instructions: No driving smoking or alcohol  Medications at discharge 1.  Tylenol as needed 2.  Eliquis 5 mg p.o. twice daily 3.  Prosight tablet 1 daily 4.  MiraLAX daily   30-35 minutes were spent completing discharge summary and discharge planning Discharge Instructions     Discharge patient   Complete by: As directed    Discharge disposition: 01-Home or Self Care   Discharge patient date: 03/27/2022        Follow-up Information     Kirsteins, Luanna Salk, MD Follow up.   Specialty: Physical Medicine and Rehabilitation Why: No formal follow-up needed Contact information: [place] [address] [phone]         Paralee Cancel, MD Follow up.   Specialty: Orthopedic Surgery Why: As needed Contact information: [address] [place] [address] [place] [address] [phone]         Belva Crome, MD Follow up.   Specialty: Cardiology Why: Call for appointment Contact information: [address] [place] [address] [phone]         Clarene Essex, MD Follow up.   Specialty: Gastroenterology Why: call for appointmenrt as needed Contact information: [address] [place] [address] [phone]                 Signed: Barbie Banner 03/27/2022, 10:23 AM

## 2022-03-22 NOTE — Progress Notes (Signed)
Patient up in [facility] this afternoon. Requested PRN pain medications prior to PT. Pain medications effective. Call light within reach

## 2022-03-22 NOTE — Progress Notes (Signed)
Physical Therapy Session Note  Patient Details  Name: Justin Potter MRN: [id number] Date of Birth: 1926-04-02  Today's Date: 03/22/2022 PT Individual Time: 0810-0905 PT Individual Time Calculation (min): 55 min   Short Term Goals: Week 1:  PT Short Term Goal 1 (Week 1): Pt will perform bed mobility with min assist PT Short Term Goal 2 (Week 1): Pt will ambulate 37f with min assist PT Short Term Goal 3 (Week 1): Pt will improve TUG to < 278m Week 2:    Week 3:     Skilled Therapeutic Interventions/Progress Updates:   Pt received sitting EOB and agreeable to PT. NT present in room. Pt performed facial hygiene sitting EOB with set up from PT. Donning shoes and pants EOb With max assist for time management from PT. [facility]. WC mobility through hall with supervision assist [id number]  Gait training with RW x [id number] CGA-supervision assist with cues for posture and improved step height on the RLE.   Dynamci balance training to performed cross body reach and bean bag toss to target while standing on red wedge x8 BUE. CGA from PT for with balance. Min assist for AD management to step onto wedge.   Patient returned to room and [facility] Inch call bell in reach and all needs met.         Therapy Documentation Precautions:  Precautions Precautions: Fall Restrictions Weight Bearing Restrictions: Yes RLE Weight Bearing: Weight bearing as tolerated Other Position/Activity Restrictions: legally blind   Pain: 4/10. R hip. Med given by RN.     Therapy/Group: Individual Therapy  AustLorie Phenix0/2023, 9:28 AM

## 2022-03-22 NOTE — Patient Care Conference (Signed)
Inpatient RehabilitationTeam Conference and Plan of Care Update Date: 03/22/2022   Time: 10:11 AM    Patient Name: Justin Potter      Medical Record Number: [id number]  Date of Birth: 08-07-1925 Sex: Male         Room/Bed: 4M09C/4M09C-01 Payor Info: Payor: MEDICARE / Plan: MEDICARE PART A AND B / Product Type: *No Product type* /    Admit Date/Time:  03/14/2022  3:25 PM  Primary Diagnosis:  Intramuscular hematoma  Hospital Problems: Principal Problem:   Intramuscular hematoma    Expected Discharge Date: Expected Discharge Date: 03/24/22  Team Members Present: Physician leading conference: Dr. Alysia Penna Social Worker Present: Erlene Quan, BSW Nurse Present: Dorien Chihuahua, RN PT Present: Barrie Folk, PT OT Present: Meriel Pica, OT SLP Present: Sherren Kerns, SLP PPS Coordinator present : Gunnar Fusi, SLP     Current Status/Progress Goal Weekly Team Focus  Bowel/Bladder   pt has incontient and continent episodes. LBM 8/29  gain full continence of b/b  Assist with time toileting and prn qshift   Swallow/Nutrition/ Hydration   bedside completed - ST not following. Symptoms GI in nature         ADL's   supervision UB self care, min LB bathing, min - mod A LB dressing and toileting, CGA to stand from regular height surfaces, min sit ><stanad from LOW recliner, CGA ambulation to bathroom  mod I with toileting, toilet transfer, UB dressing, LB dressing (may need to downgrade LB dressing); supervision with shower and shower transfers  ADL training, functional mobility, pt/family education, AE training, general strengthening   Mobility   Vision impairment, continues to have R hip/ buttock pain but improving since eval; bed mobility = supervision with extra time and use of bedrail, sit<>stand = light MinA to light CGA for rise to stand and then light MinA to prevent hard sit when lowering to sit, stand pivot = CGA, ambulation up to ~100 ft using RW with flexed  posture and decreased step height  Overall supervision  strengthening, NMR for balance, sit<>stand transfers,  improving activity tolerance, improving confidence   Communication             Safety/Cognition/ Behavioral Observations            Pain   No c/o pain  remain pain free  Assess pain qshift   Skin   Skin intact, large abrasions on the right side  Keep skin intact and free from skin breakdown  Assess skin qshift     Discharge Planning:  Anticipates retuning to [facility]. Using rollator prior to admission. Sonn lives near facility and is able to check on patient.   Team Discussion: Patient with previous esophageal strictures/dilatations/EGDs, etc. Now on soft diet. Continue Eliquis per MD. Progress limited by pain in hip, limited vision.  Patient on target to meet rehab goals: yes, currently needs CGA for sit- stand and gait, shower and dressing. Needs help with adaptive equipment for compression hose and shoes. Goals for lower body dressing set for min assist. Currently needs mod assist for transfers and able to ambulate up to 175' with mod assist. Goals for discharge set for supervision overall.  *See Care Plan and progress notes for long and short-term goals.   Revisions to Treatment Plan:  SLP signed off   Teaching Needs: Safety, medication management, dietary modification recommendations, transfers, toileting, etc  Current Barriers to Discharge: Decreased caregiver support  Possible Resolutions to Barriers: Family education Family plans for additional  help at ILF     Medical Summary Current Status: buttocks hematoma stable, Hgb low but stabilizing, esophageal dysphagia  Barriers to Discharge: Medical stability   Possible Resolutions to Barriers/Weekly Focus: GI on consult for esophageal dysphagia, monitor CBC on Eliquis Modify diet   Continued Need for Acute Rehabilitation Level of Care: The patient requires daily medical management by a physician with  specialized training in physical medicine and rehabilitation for the following reasons: Direction of a multidisciplinary physical rehabilitation program to maximize functional independence : Yes Medical management of patient stability for increased activity during participation in an intensive rehabilitation regime.: Yes Analysis of laboratory values and/or radiology reports with any subsequent need for medication adjustment and/or medical intervention. : Yes   I attest that I was present, lead the team conference, and concur with the assessment and plan of the team.   Dorien Chihuahua B 03/22/2022, 2:06 PM

## 2022-03-22 NOTE — Progress Notes (Signed)
Occupational Therapy Weekly Progress Note  Patient Details  Name: Justin Potter MRN: [id number] Date of Birth: 1926/05/27  Beginning of progress report period: March 15, 2022 End of progress report period: March 22, 2022  Today's Date: 03/22/2022 OT Individual Time: 0915-1000  OT Individual Time Calculation (min): 45 min   Second session: Missed 30 min second session due to fatigue and declined therapy.     Patient has met 2 of 3 short term goals.  Pt continues to need some A with LB dressing at a mod A level to don pants over feet, don TED hose and shoes.  He will likely need some A with his TED hose/compression socks and shoes until his hip pain allows him to do full hip flexion to reach towards his feet.   Patient continues to demonstrate muscle weakness and muscle joint tightness, decreased cardiorespiratoy endurance, and decreased standing balance and decreased balance strategiesrate the following deficits:  and therefore will continue to benefit from skilled OT intervention to enhance overall performance with BADL.  Patient not progressing toward long term goals. Of Mod I.   Plan of care revisions: LB dressing downgraded to min A., toileting, standing balance downgraded to supervision.   OT Short Term Goals Week 1:  OT Short Term Goal 1 (Week 1): Pt will complete 1/3 toileting steps with no more than min A for balance OT Short Term Goal 1 - Progress (Week 1): Met OT Short Term Goal 2 (Week 1): Pt will using AE PRN to dress LB with min A OT Short Term Goal 2 - Progress (Week 1): Progressing toward goal OT Short Term Goal 3 (Week 1): Pt will complete toilet transfer with CGA using LRAD OT Short Term Goal 3 - Progress (Week 1): Met Week 2:  OT Short Term Goal 1 (Week 2): STGs = downgraded LTGs (supervision with self care except LB dressing downgraded to min A)  Skilled Therapeutic Interventions/Progress Updates:    Visit 1: Pain: no pain at rest in sitting  Pt received in w/c.   He had just finished PT with walking so focused on UE ROM and strengthening using a 4# dowel bar. 15 bicep curls, 10 bicep extension with palms up 3 sets 12 chest press, 5-7 overhead press 3 sets.  Trialed using a sock aide with his compression hose but they are too tight and pt will need A.   Pt discussed his desire to return to ILF soon and he recognizes he will need more A at home.    Pt resting in wc with belt alarm on and all needs met.   Visit 2: Pain: no c/o pain Pt received in bed sleeping. He did awaken but stated " oh I am so sorry but I just cant get up right now, I really need to sleep! I am so tired!"  Allowed pt to rest so he is prepared for his afternoon PT session.   Therapy Documentation Precautions:  Precautions Precautions: Fall Restrictions Weight Bearing Restrictions: Yes RLE Weight Bearing: Weight bearing as tolerated Other Position/Activity Restrictions: legally blind  ADL: ADL Eating: Set up Where Assessed-Eating: Bed level Grooming: Setup (Pt able to complete oral care and facial hygiene with set-up assistance while seated inthe w/c at the sink. Pt does require minimal assistance for thoroughness with shaving face due to vision deficits.) Where Assessed-Grooming: Wheelchair, Sitting at sink Upper Body Bathing: Supervision/safety (Pt able to bathe all UB parts with supervision while seated in the tub-bench in the shower. Pt  requires assistance to retrieve washcloths and sequence due to decreased vision.) Where Assessed-Upper Body Bathing: Shower Lower Body Bathing: Minimal assistance (for A with bottom.  Used LH sponge for feet.) Where Assessed-Lower Body Bathing: Shower Upper Body Dressing: Setup Where Assessed-Upper Body Dressing: Wheelchair Lower Body Dressing: Maximal assistance Where Assessed-Lower Body Dressing: Wheelchair Toileting: Moderate assistance Where Assessed-Toileting: Glass blower/designer: Therapist, music Method:  Counselling psychologist: Grab bars, Raised toilet seat Tub/Shower Transfer: Unable to assess Tub/Shower Transfer Method: Unable to assess Social research officer, government: Curator Method: Heritage manager: Grab bars, Transfer tub bench ADL Comments: Pt required increased A with LB dressing due to visual impairments and difficulty fully flexing at hip.  Reacher difficulty for him to use due to low vision.       Therapy/Group: Individual Therapy  Short 03/22/2022, 9:42 AM

## 2022-03-22 NOTE — Progress Notes (Signed)
Occupational Therapy Session Note  Patient Details  Name: Justin Potter MRN: [id number] Date of Birth: [birth year]  Today's Date: 03/22/2022 OT Individual Time: 1325-1348 OT Individual Time Calculation (min): 23 min    Short Term Goals: Week 2:  OT Short Term Goal 1 (Week 2): STGs = LTGs (LB dressing downgraded to min A)  Skilled Therapeutic Interventions/Progress Updates:  Pt awake in bed upon OT arrival to the room. Pt reports, "I'm just exhausted from the exercises this morning." Pt requires minimal encouragement to participate in OT session, however, pt agreeable to oral care tasks prior to PT session this afternoon.   Therapy Documentation Precautions:  Precautions Precautions: Fall Restrictions Weight Bearing Restrictions: Yes RLE Weight Bearing: Weight bearing as tolerated Other Position/Activity Restrictions: legally blind Vital Signs: Please see "Flowsheet" for most recent vitals charted by nursing staff.  Pain: Pain Assessment Pain Scale: 0-10 Pain Score: 2  Pain Type: Acute pain Pain Location: Hip Pain Orientation: Right Pain Descriptors / Indicators: Aching Pain Onset: On-going Pain Intervention(s): Emotional support;Distraction Multiple Pain Sites: No  ADL: Grooming: (P) Setup (Pt able to complete oral care with set-up assist while seated in the w/c.) Where Assessed-Grooming: (P) Wheelchair, Sitting at sink ADL Comments: (P) Pt able to complete supiune <> sit transfer with minimal assistance to manage RLE secondary to pain levels. Pt able to perform sit <> stand from EOB and w/c with minimal assist for force production and perform ambulatory transfer from EOB <> w/c with CGA with use of FWW. Pt requires increased time to complete ADLs secondary to fatigue and visual deficits.    Pt returned to bed at end of session. Pt left resting comfortably in bed with personal belongings and call light within reach, bed alarm on and activated, bed in low position, 3 bed  rails up, and comfort needs attended to.   Therapy/Group: Individual Therapy  Barbee Shropshire 03/22/2022, 4:03 PM

## 2022-03-22 NOTE — Progress Notes (Signed)
PROGRESS NOTE   Subjective/Complaints:  Appreciate GI consult , RIght buttocks pain slowly getting better  ROS- neg CP, SOB,  N/V/D, abdominal pain, HA  Objective:   DG ESOPHAGUS W SINGLE CM (SOL OR THIN BA)  Result Date: 03/20/2022 CLINICAL DATA:  Patient with a history of dysphagia. Prior history of esophageal stricture with dilation. EXAM: ESOPHAGUS/BARIUM SWALLOW/TABLET STUDY TECHNIQUE: Single contrast examination was performed using thin liquid barium. This exam was performed by Soyla Dryer, NP, and was supervised and interpreted by Dr. Nyoka Cowden. FLUOROSCOPY: Radiation Exposure Index (as provided by the fluoroscopic device): 13.7 mGy Kerma COMPARISON:  DG esophagus 07/15/2018 FINDINGS: Swallowing: Appears normal. No vestibular penetration or aspiration seen. Pharynx: Unremarkable. Esophagus: Distal esophageal narrowing, possible stricture. Esophageal motility: Poor motility with tertiary contractions Hiatal Hernia: Not observed/not tested Gastroesophageal reflux: Not observed/not tested Ingested 57m barium tablet: Not given Other: None. IMPRESSION: Limited exam due to patient immobility and pain in his hip. Patient swallowed barium easily; however, minimal barium passed into the stomach. Distal esophageal narrowing observed consistent with stricture. Over the course of approximately 10 minutes the barium remained in the esophagus. Tertiary contractions can be seen throughout the study with barium refluxing back and forth within the esophagus. read by: JSoyla Dryer NP Electronically Signed   By: JMarijo ConceptionM.D.   On: 03/20/2022 15:22   Recent Labs    03/20/22 0723  WBC 8.9  HGB 9.0*  HCT 28.0*  PLT 367    Recent Labs    03/20/22 0723  NA 140  K 4.0  CL 107  CO2 27  GLUCOSE 110*  BUN 25*  CREATININE 0.79  CALCIUM 8.6*     Intake/Output Summary (Last 24 hours) at 03/22/2022 0919 Last data filed at 03/22/2022  0100 Gross per 24 hour  Intake 354 ml  Output 100 ml  Net 254 ml         Physical Exam: Vital Signs Blood pressure (!) 143/56, pulse (!) 56, temperature 98.3 F (36.8 C), resp. rate 18, height _0  (1.88 m), weight 81.6 kg, SpO2 100 %.  General: No acute distress, in chair eating lunch Mood and affect are appropriate, pleasant Heart: Regular rate and rhythm no rubs murmurs or extra sounds Lungs: Clear to auscultation, breathing unlabored, no rales or wheezes Abdomen: Positive bowel sounds, soft nontender to palpation, nondistended Extremities: No clubbing, cyanosis, or edema Skin: No evidence of breakdown, no evidence of rash Neurologic: Alert and oriented x4, answers president correctly, answers age correctly, Follows commands, Cranial nerves II through XII intact, motor strength is 5/5 in bilateral deltoid, bicep, tricep, grip, Left hip flexor, knee extensors, ankle dorsiflexor and plantar flexor RLE 3- proximal muscle due to hip/buttocks pain Sensory exam normal sensation to light touch ain bilateral upper and lower extremities  Musculoskeletal: reduced R hip abd due to pain , large ecchymosis Right buttocks    Assessment/Plan: 1. Functional deficits which require 3+ hours per day of interdisciplinary therapy in a comprehensive inpatient rehab setting. Physiatrist is providing close team supervision and 24 hour management of active medical problems listed below. Physiatrist and rehab team continue to assess barriers to discharge/monitor patient progress toward functional and  medical goals  Care Tool:  Bathing    Body parts bathed by patient: Right arm, Left arm, Abdomen, Chest, Right upper leg, Left upper leg, Face   Body parts bathed by helper: Right lower leg, Left lower leg, Front perineal area     Bathing assist Assist Level: Moderate Assistance - Patient 50 - 74%     Upper Body Dressing/Undressing Upper body dressing   What is the patient wearing?: Pull over  shirt    Upper body assist Assist Level: Minimal Assistance - Patient > 75%    Lower Body Dressing/Undressing Lower body dressing      What is the patient wearing?: Pants     Lower body assist Assist for lower body dressing: Maximal Assistance - Patient 25 - 49%     Toileting Toileting    Toileting assist Assist for toileting: Moderate Assistance - Patient 50 - 74%     Transfers Chair/bed transfer  Transfers assist  Chair/bed transfer activity did not occur: Safety/medical concerns  Chair/bed transfer assist level: Contact Guard/Touching assist     Locomotion Ambulation   Ambulation assist      Assist level: Contact Guard/Touching assist Assistive device: Walker-rolling Max distance: 175 ft   Walk 10 feet activity   Assist     Assist level: Contact Guard/Touching assist Assistive device: Walker-rolling   Walk 50 feet activity   Assist Walk 50 feet with 2 turns activity did not occur: Safety/medical concerns  Assist level: Contact Guard/Touching assist Assistive device: Walker-rolling    Walk 150 feet activity   Assist Walk 150 feet activity did not occur: Safety/medical concerns  Assist level: Contact Guard/Touching assist Assistive device: Walker-rolling    Walk 10 feet on uneven surface  activity   Assist Walk 10 feet on uneven surfaces activity did not occur: Safety/medical concerns         Wheelchair     Assist Is the patient using a wheelchair?: Yes Type of Wheelchair: Manual    Wheelchair assist level: Supervision/Verbal cueing Max wheelchair distance: 150    Wheelchair 50 feet with 2 turns activity    Assist        Assist Level: Supervision/Verbal cueing   Wheelchair 150 feet activity     Assist      Assist Level: Supervision/Verbal cueing   Blood pressure (!) 143/56, pulse (!) 56, temperature 98.3 F (36.8 C), resp. rate 18, height _0  (1.88 m), weight 81.6 kg, SpO2 100 %.  Medical Problem  List and Plan: 1. Functional deficits secondary to large intramuscular hematoma right gluteus maximus muscle measuring at least 11 x 5.6 x 11.4 cm after fall             -patient may  shower             Team conference today please see physician documentation under team conference tab, met with team  to discuss problems,progress, and goals. Formulized individual treatment plan based on medical history, underlying problem and comorbidities.               - Excellent dispo with independent living facility, has nursing aide and PT/OT services available as needed. Son (retired Scientist, clinical (histocompatibility and immunogenetics)) lives 2-3  min away and available for assistance.               - Ambulates with 4 wheeled walked Mod I at baseline; Ind ADLs 2.  Antithrombotics: -DVT/anticoagulation:  Pharmaceutical: Eliquis - resumed Monday 8/21; HgB stable at ~8  Latest Ref Rng & Units 03/20/2022    7:23 AM 03/18/2022    6:31 AM 03/17/2022    6:27 AM  CBC  WBC 4.0 - 10.5 K/uL 8.9   8.8   Hemoglobin 13.0 - 17.0 g/dL 9.0  8.1  8.1   Hematocrit 39.0 - 52.0 % 28.0  25.0  25.2   Platelets 150 - 400 K/uL 367   256                -antiplatelet therapy: N/A 3. Pain Management: Low-dose oxycodone as needed- has no rest pain but pain with movement such as rolling               - Takes Tylenol 1000 mg BID at baseline for chronic back pain  -Right glute pain controled with current medications  -Will DC oxycodone, continue tylenol for now  -Pain controlled today 4. Mood/Behavior/Sleep: Provide emotional support             -antipsychotic agents: N/A 5. Neuropsych/cognition: This patient is capable of making decisions on his own behalf. 6. Skin/Wound Care: Routine skin checks                            - Sees podiatry for foot care as OP 7. Fluids/Electrolytes/Nutrition: Routine in and out with follow-up chemistry               - Diet advancing from full liquid 8/22- appears to be tolerating diet 8.  Acute blood loss anemia related  hematoma.  - Cont to monitor, recheck in am will need outpt CBC ~1wk post d/c  -HBG stable at 8.1 on 03/18/22                - Monitor hematoma closely for s/s enlargement 9.  Atrial fibrillation as well as bradycardia.  Eliquis resumed.  Follow-up with EP cardiology team.  No current plans for pacer                - Irregular HR but stable rate, monitor    - Mild Loletha Grayer today 8/25, asymptomatic, continue to monitor  -Currently pulse 68, occasional brady, asymptomatic 10.  History of prostate cancer with radioactive seed implant.  Follow-up outpatient                - Baseline urinary incontinent with pads at nighttime and briefs during daytime 11.  Macular degeneration.  Resume Ocuvite.                - Pt legally blind, sees "shadows" only beyond 12 in, but can   navigate hallways and performs ADLs independently at baseline   12. Hx cardioembolic stroke d/t Afib, no residual deficits 13. Mild confusion-imporved, Pt alert and oriented x4, suspect related to oxycodone dose-will discontinue for now and monitor  -resolved    14.  Dysphagia, sounds like esophageal phase check Ba++ esophagram, SLP eval , up in chair for meals  Ask GI to eval . Discussed with pt's sone who is MD and reviewed prior records of EGD, pt had EGD April 2022, showing presbyesophagus no true sstricture but prior EGD in 2019 did demonstrate stricture, had small parietal CVA after EGD in 2022, need to carefully consider risk prior to potential repeat procedure if Eliquis needs to be stopped  LOS: 8 days A FACE TO FACE EVALUATION WAS PERFORMED  Charlett Blake 03/22/2022, 9:19 AM

## 2022-03-22 NOTE — Progress Notes (Signed)
Physical Therapy Session Note  Patient Details  Name: Justin Potter MRN: [id number] Date of Birth: Jul 27, 1925  Today's Date: 03/22/2022 PT Individual Time: 1404-1500 PT Individual Time Calculation (min): 56 min   Short Term Goals: Week 1:  PT Short Term Goal 1 (Week 1): Pt will perform bed mobility with min assist PT Short Term Goal 2 (Week 1): Pt will ambulate 52f with min assist PT Short Term Goal 3 (Week 1): Pt will improve TUG to < 279m Week 2:    Week 3:     Skilled Therapeutic Interventions/Progress Updates:   Pt received supine in bed and agreeable to PT. Supine>sit transfer with increased time and supervision assist from PT for safety with cues for ROm on the RLE. Pt noted to have significantly improved sequencing compared to prior session with this PT with reduced pain in the R hip.   Stand pivot [facility]  [facility] and [facility] for safety. [facility] [place] [facility]  Pt performed 5 time sit<>stand (5xSTS): 37 sec (>15 sec indicates increased fall risk)   Gait [facility] [id number] CGA and cues for step height on the RLE due to poor foot clearance.   Nustep BLE endurance training and AAROM for the RLE. Performed x 7 minutes withcues for increased ROM as tolerated. Stand pivot transfers to and from [facility] for safety and RW.  Patient returned to room and left sitting in [facility] reach and all needs met.         Therapy Documentation Precautions:  Precautions Precautions: Fall Restrictions Weight Bearing Restrictions: Yes RLE Weight Bearing: Weight bearing as tolerated Other Position/Activity Restrictions: legally blind    Vital Signs: Therapy Vitals Temp: 97.9 F (36.6 C) Pulse Rate: (!) 54 Resp: 17 BP: 117/64 Patient Position (if appropriate): Lying Oxygen Therapy SpO2: 100 % O2 Device: Room Air Pain: Pain Assessment Pain Scale: 0-10 Pain Score: 2  Pain Type: Acute pain Pain  Location: Hip Pain Orientation: Right Pain Descriptors / Indicators: Aching Pain Onset: On-going Pain Intervention(s): Emotional support;Distraction Multiple Pain Sites: No     Therapy/Group: Individual Therapy  AusLorie Phenix30/2023, 3:34 PM

## 2022-03-22 NOTE — Progress Notes (Signed)
Patient ID: Justin Potter, male   DOB: [birth year], 88 y.o.   MRN: [id number]  Team Conference Report to Patient/Family  Team Conference discussion was reviewed with the patient and caregiver, including goals, any changes in plan of care and target discharge date.  Patient and caregiver express understanding and are not in agreement (see progress notes).  The patient has a target discharge date of 03/24/22.  Sw spoke with patient son, [name] and provided team conference updates. Patient son requesting extension due to requiring time to arrange supervision with patient ILF. Patient son and his spouse will be going out of town this weekend. Sw informed physician and therapy team, waiting on determination.  [name] 03/22/2022, 1:28 PM

## 2022-03-22 NOTE — Progress Notes (Signed)
Physical Therapy Session Note  Patient Details  Name: Justin Potter MRN: [id number] Date of Birth: [birth year]  Today's Date: 03/22/2022 PT Individual Time: 1025-1120 PT Individual Time Calculation (min): 55 min   Short Term Goals: Week 1:  PT Short Term Goal 1 (Week 1): Pt will perform bed mobility with min assist PT Short Term Goal 2 (Week 1): Pt will ambulate 29f with min assist PT Short Term Goal 3 (Week 1): Pt will improve TUG to < 273m  Skilled Therapeutic Interventions/Progress Updates:    Pt seated in w/c on arrival and agreeable to therapy. Pt reports 5/10 pain during session, premedicated. Rest and positioning provided as needed. Pt transported to therapy gym for time management and energy conservation. Pt navigated 2 x 2 laps of ramp with CGA and RW. Cueing for safety when  navigating edge of ramp and turning with RW. Gait 2 x 12 ft to/from nustep with CGA and RW. Noted kypohtic posture and low foot clearance, cues for RW proximity while turning. Pt then utilized nustep x 18 min at level 4 for global strength, ROM, and endurance. Pt was transported back to room and then requested to return to bed. ambulatory transfer to bed as above. Sit>supine with min A for RLE management. Pt remained in bed and was left with all needs in reach and alarm active.   Therapy Documentation Precautions:  Precautions Precautions: Fall Restrictions Weight Bearing Restrictions: Yes RLE Weight Bearing: Weight bearing as tolerated Other Position/Activity Restrictions: legally blind General:       Therapy/Group: Individual Therapy  OlMickel Fuchs/30/2023, 10:56 AM

## 2022-03-23 MED ORDER — ACETAMINOPHEN 325 MG PO TABS: 650.0000 mg | ORAL_TABLET | Freq: Four times a day (QID) | ORAL | Status: DC | PRN

## 2022-03-23 MED ORDER — ELIQUIS 5 MG PO TABS: 5.0000 mg | ORAL_TABLET | Freq: Two times a day (BID) | ORAL | 0 refills | Status: DC

## 2022-03-23 NOTE — Progress Notes (Signed)
Inpatient Rehabilitation Discharge Medication Review by a Pharmacist  A complete drug regimen review was completed for this patient to identify any potential clinically significant medication issues.  High Risk Drug Classes Is patient taking? Indication by Medication  Antipsychotic No   Anticoagulant Yes Eliquis - Afib  Antibiotic No   Opioid No   Antiplatelet No   Hypoglycemics/insulin No   Vasoactive Medication No   Chemotherapy No   Other No      Type of Medication Issue Identified Description of Issue Recommendation(s)  Drug Interaction(s) (clinically significant)     Duplicate Therapy     Allergy     No Medication Administration End Date     Incorrect Dose     Additional Drug Therapy Needed     Significant med changes from prior encounter (inform family/care partners about these prior to discharge).    Other       Clinically significant medication issues were identified that warrant physician communication and completion of prescribed/recommended actions by midnight of the next day:  No  Pharmacist comments: None  Time spent performing this drug regimen review (minutes):  30 minutes  Thank you Anette Guarneri, PharmD 03/23/2022 8:52 AM

## 2022-03-23 NOTE — Progress Notes (Signed)
Patient ID: Justin Potter, male   DOB: 02/10/1926, 88 y.o.   MRN: [id number]  NO DME or follow up reccs from therapy.

## 2022-03-23 NOTE — Evaluation (Signed)
Recreational Therapy Assessment and Plan  Patient Details  Name: Justin Potter MRN: [id number] Date of Birth: January 02, 1926 Today's Date: 03/23/2022  Rehab Potential:  Good ELOS:   d/c 9/4  Assessment   Hospital Problem: Principal Problem:   Intramuscular hematoma     Past Medical History:      Past Medical History:  Diagnosis Date   Anxiety     Arthritis      "in the fingers" per pt   Atrial fibrillation ([place])     Bladder neck contracture     Dysrhythmia      A-FIB / HX BRADYCARDIA - FOLLOWED BY DR. Chittenden DUE TO [name]   Gross hematuria     History of gout     History of prostate cancer      S/P RADIOACTIVE SEED IMPLANTS   History of squamous cell carcinoma of skin      EXCISION LOWER LIP AND RIGHT EAR   History of TIA (transient ischemic attack)      probable tia no residual  09-09-2012   Hyperlipidemia     Hypertension     Macular degeneration      both eyes   Urethral stricture     Urinary retention      Past Surgical History:       Past Surgical History:  Procedure Laterality Date   APPENDECTOMY       CATARACT EXTRACTION W/ INTRAOCULAR LENS  IMPLANT, BILATERAL       CHOLECYSTECTOMY   1977   CYSTOSCOPY WITH URETHRAL DILATATION N/A 02/12/2013    Procedure: CYSTOSCOPY WITH BALLOON DILATATION OF URETHRAL STRICTURE AND BLADDER FULGERATION;  Surgeon: Bernestine Amass, MD;  Location: [facility];  Service: Urology;  Laterality: N/A;   ESOPHAGOGASTRODUODENOSCOPY (EGD) WITH PROPOFOL N/A 11/02/2020    Procedure: ESOPHAGOGASTRODUODENOSCOPY (EGD) WITH PROPOFOL;  Surgeon: Clarene Essex, MD;  Location: [facility];  Service: Endoscopy;  Laterality: N/A;   LAPAROSCOPIC APPENDECTOMY N/A 08/22/2013    Procedure: APPENDECTOMY LAPAROSCOPIC;  Surgeon: Pedro Earls, MD;  Location: [facility];  Service: General;  Laterality: N/A;   [place] N/A 11/02/2020    Procedure: SAVORY DILATION;  Surgeon: Clarene Essex, MD;   Location: [facility];  Service: Endoscopy;  Laterality: N/A;   TONSILLECTOMY   as child   TOTAL KNEE ARTHROPLASTY Right 11-23-2004      Assessment & Plan Clinical Impression: Patient is a 88 year old right-handed male with history of prostate cancer with radioactive seed implant, atrial fibrillation on Eliquis, persistent bradycardia, TIA, macular degeneration, hypertension, hyperlipidemia, BPH/urinary retention.  Per chart review patient resides [facility].  1 level apartment.  Ambulates independently with a rollator.  Presented 03/12/2022 with increasing right hip pain after a fall 6 days ago as well as hypotensive and bradycardic.  On 03/09/2022 he went to [facility] had a CT of hip done which showed some swelling and no fracture but noted some soft tissue edema and some ill-defined fluid within the subcutaneous tissue overlying the posterior lateral aspect of the right hip.  He was given no pain medications at that time and returned home.  Patient noted increasing pain to her right hip.  CT of right hip follow-up showed large intramuscular hematoma in the right gluteus maximus muscle measuring at least 11 x 5.6 11.4 cm.  Soft tissue contusion.  No acute osseous injury of the right hip.  Admission chemistries unremarkable except BUN  24, hemoglobin 11.1, WBC 11,600.  He did receive consultation follow-up by Dr. Alvan Dame with conservative care.  His Eliquis was initially held due to hematoma and has since been resumed.  He did receive followed by cardiology service for history of junctional bradycardia with heart rate approximately 50 bpm and patient continued to be followed by EP currently no plan for pacer.  Hospital course anemia 8.8 monitored.  Therapy evaluations completed due to patient decreased functional mobility was admitted for a comprehensive rehab program.  Patient transferred to [facility] on 03/14/2022.   Met with pt briefly to discuss TR services.  Full eval deferred as pt is  expected to discharge 9/4.  Pt states he is feeling ready for discharge on Monday.     Plan   No further TR   Recommendations for other services: None   Discharge Criteria: Patient will be discharged from TR if patient refuses treatment 3 consecutive times without medical reason.  If treatment goals not met, if there is a change in medical status, if patient makes no progress towards goals or if patient is discharged from hospital.  The above assessment, treatment plan, treatment alternatives and goals were discussed and mutually agreed upon: by patient  [place]le[place] 03/23/2022, 3:42 PM

## 2022-03-23 NOTE — Progress Notes (Signed)
Physical Therapy Weekly Progress Note  Patient Details  Name: Justin Potter MRN: [id number] Date of Birth: 27-Nov-1925  Beginning of progress report period: March 15, 2022 End of progress report period: March 23, 2022  Today's Date: 03/23/2022 PT Individual Time: 2979-8921 and 1535-1630 PT Individual Time Calculation (min): 74 min and 29mn   Patient has met 3 of 3 short term goals.  Pt is making stedy progress toward LTG of supervision assist overall. Supervision assist  to CGA when fatigued forr all mobility. No family education performed to this point, but pt's fmaily aware of supervision assist needs at D/C  Patient continues to demonstrate the following deficits muscle weakness and muscle joint tightness, decreased cardiorespiratoy endurance, and decreased standing balance and decreased balance strategies and therefore will continue to benefit from skilled PT intervention to increase functional independence with mobility.  Patient progressing toward long term goals..  Continue plan of care.  PT Short Term Goals Week 1:  PT Short Term Goal 1 (Week 1): Pt will perform bed mobility with min assist PT Short Term Goal 1 - Progress (Week 1): Met PT Short Term Goal 2 (Week 1): Pt will ambulate 455fwith min assist PT Short Term Goal 2 - Progress (Week 1): Met PT Short Term Goal 3 (Week 1): Pt will improve TUG to < 33m68mPT Short Term Goal 3 - Progress (Week 1): Met Week 2:  PT Short Term Goal 1 (Week 2): STG=LTG due to ELOS  Skilled Therapeutic Interventions/Progress Updates:  Session 1.   Pt received sitting in WC and agreeable to PT. Pt performed WC mobility through hall x 150f54fth supervision assist. Gait training with RW to weave through 6 cones x 2 with RW and supervision assist with cues for posture and step length on the RLE. Seated therex:LAQ, HS curl, hip abduction, hip adduction, ankle pumps, hip flexion. Each performed AROM x 12 with cues for hold at end range. Nustep BLE  endurance training x 8 minutes with cues for full ROM on the RLE. Throughout session sit<>[facility] C[facility]-supervision assist and increased time. Pt returned to room and performed stand pviot transfer to bed with RW and supervision assist. Sit>supine completed with min assist at the RLE. Pt left supine in bed with call bell in reach and all needs met.   Session 2.  Pt received supine in bed and agreeable to PT. Supine>sit transfer with supervision assist assist and cues for RLE posittioning. Sit<>stand for urination in urinal with supervision assist for balance. Pt performed clothingin management. Stand pivot to [facility] supervision assist and RW. [place] with RW 2 x 80ft7fh supervision assist over cement sidewalk. WC mobility x 200ft 104f cement sidewalk with supervision assist and cues of WC control on down hill grade. Pt returned to room and performed stand pivot transfer to bed with RW and supervision assist. Sit>supine completed with min assist at RLE and left supine in bed with call bell in reach and all needs met.         Therapy Documentation Precautions:  Precautions Precautions: Fall Restrictions Weight Bearing Restrictions: Yes RLE Weight Bearing: Weight bearing as tolerated Other Position/Activity Restrictions: legally blind    Vital Signs: Therapy Vitals Temp: 97.9 F (36.6 C) Temp Source: Oral Pulse Rate: (!) 52 Resp: 18 BP: 105/63 Patient Position (if appropriate): Lying Oxygen Therapy SpO2: 98 % O2 Device: Room Air Pain: Denies at rest in session 1 a nd 2  Therapy/Group: Individual Therapy  Lorie Phenix 03/23/2022, 3:38 PM

## 2022-03-23 NOTE — Progress Notes (Signed)
Patient ID: Justin Potter, male   DOB: 1926-03-01, 88 y.o.   MRN: [id number]  Patient discharge extended to Monday 9/4

## 2022-03-23 NOTE — Progress Notes (Addendum)
PROGRESS NOTE   Subjective/Complaints:  Discussed d/c date, pt was anxious to go home yesterday , more apprehensive this am OT starting ADLs with pt   ROS- neg CP, SOB,  N/V/D, abdominal pain, HA  Objective:   No results found. No results for input(s): "WBC", "HGB", "HCT", "PLT" in the last 72 hours.  No results for input(s): "NA", "K", "CL", "CO2", "GLUCOSE", "BUN", "CREATININE", "CALCIUM" in the last 72 hours.   Intake/Output Summary (Last 24 hours) at 03/23/2022 0833 Last data filed at 03/22/2022 1730 Gross per 24 hour  Intake 238 ml  Output --  Net 238 ml         Physical Exam: Vital Signs Blood pressure (!) 153/74, pulse (!) 54, temperature 98.2 F (36.8 C), temperature source Oral, resp. rate 16, height '6\' 2"'$  (1.88 m), weight 81.6 kg, SpO2 100 %.  General: No acute distress, in chair eating lunch Mood and affect are appropriate, pleasant Heart: Regular rate and rhythm no rubs murmurs or extra sounds Lungs: Clear to auscultation, breathing unlabored, no rales or wheezes Abdomen: Positive bowel sounds, soft nontender to palpation, nondistended Extremities: No clubbing, cyanosis, or edema Skin: No evidence of breakdown, no evidence of rash Neurologic: Alert and oriented x4, answers president correctly, answers age correctly, Follows commands, Cranial nerves II through XII intact, motor strength is 5/5 in bilateral deltoid, bicep, tricep, grip, Left hip flexor, knee extensors, ankle dorsiflexor and plantar flexor RLE 3- proximal muscle due to hip/buttocks pain Sensory exam normal sensation to light touch ain bilateral upper and lower extremities  Musculoskeletal: reduced R hip abd due to pain , large ecchymosis Right buttocks    Assessment/Plan: 1. Functional deficits which require 3+ hours per day of interdisciplinary therapy in a comprehensive inpatient rehab setting. Physiatrist is providing close team  supervision and 24 hour management of active medical problems listed below. Physiatrist and rehab team continue to assess barriers to discharge/monitor patient progress toward functional and medical goals  Care Tool:  Bathing    Body parts bathed by patient: Right arm, Left arm, Abdomen, Chest, Right upper leg, Left upper leg, Face   Body parts bathed by helper: Right lower leg, Left lower leg, Front perineal area     Bathing assist Assist Level: Moderate Assistance - Patient 50 - 74%     Upper Body Dressing/Undressing Upper body dressing   What is the patient wearing?: Pull over shirt    Upper body assist Assist Level: Minimal Assistance - Patient > 75%    Lower Body Dressing/Undressing Lower body dressing      What is the patient wearing?: Pants     Lower body assist Assist for lower body dressing: Maximal Assistance - Patient 25 - 49%     Toileting Toileting    Toileting assist Assist for toileting: Moderate Assistance - Patient 50 - 74%     Transfers Chair/bed transfer  Transfers assist  Chair/bed transfer activity did not occur: Safety/medical concerns  Chair/bed transfer assist level: Contact Guard/Touching assist     Locomotion Ambulation   Ambulation assist      Assist level: Contact Guard/Touching assist Assistive device: Walker-rolling Max distance: 175 ft   Walk  10 feet activity   Assist     Assist level: Contact Guard/Touching assist Assistive device: Walker-rolling   Walk 50 feet activity   Assist Walk 50 feet with 2 turns activity did not occur: Safety/medical concerns  Assist level: Contact Guard/Touching assist Assistive device: Walker-rolling    Walk 150 feet activity   Assist Walk 150 feet activity did not occur: Safety/medical concerns  Assist level: Contact Guard/Touching assist Assistive device: Walker-rolling    Walk 10 feet on uneven surface  activity   Assist Walk 10 feet on uneven surfaces activity did  not occur: Safety/medical concerns         Wheelchair     Assist Is the patient using a wheelchair?: Yes Type of Wheelchair: Manual    Wheelchair assist level: Supervision/Verbal cueing Max wheelchair distance: 150    Wheelchair 50 feet with 2 turns activity    Assist        Assist Level: Supervision/Verbal cueing   Wheelchair 150 feet activity     Assist      Assist Level: Supervision/Verbal cueing   Blood pressure (!) 153/74, pulse (!) 54, temperature 98.2 F (36.8 C), temperature source Oral, resp. rate 16, height '6\' 2"'$  (1.88 m), weight 81.6 kg, SpO2 100 %.  Medical Problem List and Plan: 1. Functional deficits secondary to large intramuscular hematoma right gluteus maximus muscle measuring at least 11 x 5.6 x 11.4 cm after fall             -patient may  shower             9/4 d/c date, to [place] will need additional hired help in [place] section               - Son (retired Scientist, clinical (histocompatibility and immunogenetics)) lives 2-3  min away and available for assistance.               2.  Antithrombotics: -DVT/anticoagulation:  Pharmaceutical: Eliquis - resumed Monday 8/21; HgB stable at ~8     Latest Ref Rng & Units 03/20/2022    7:23 AM 03/18/2022    6:31 AM 03/17/2022    6:27 AM  CBC  WBC 4.0 - 10.5 K/uL 8.9   8.8   Hemoglobin 13.0 - 17.0 g/dL 9.0  8.1  8.1   Hematocrit 39.0 - 52.0 % 28.0  25.0  25.2   Platelets 150 - 400 K/uL 367   256                -antiplatelet therapy: N/A 3. Pain Management: Low-dose oxycodone as needed- has no rest pain but pain with movement such as rolling               - Takes Tylenol 1000 mg BID at baseline for chronic back pain  -Right glute pain controled with current medications  -Will DC oxycodone, continue tylenol for now  -Pain controlled today 4. Mood/Behavior/Sleep: Provide emotional support             -antipsychotic agents: N/A 5. Neuropsych/cognition: This patient is capable of making decisions on his own behalf. 6. Skin/Wound  Care: Routine skin checks                            - Sees podiatry for foot care as OP 7. Fluids/Electrolytes/Nutrition: Routine in and out with follow-up chemistry               - Diet advancing from  full liquid 8/22- appears to be tolerating diet 8.  Acute blood loss anemia related hematoma.  - Cont to monitor, recheck in am will need outpt CBC ~1wk post d/c  -HBG stable at 8.1 on 03/18/22                - Monitor hematoma closely for s/s enlargement 9.  Atrial fibrillation as well as bradycardia.  Eliquis resumed.  Follow-up with EP cardiology team.  No current plans for pacer                - Irregular HR but stable rate, monitor    - Mild Loletha Grayer today 8/25, asymptomatic, continue to monitor  -Currently pulse 68, occasional brady, asymptomatic 10.  History of prostate cancer with radioactive seed implant.  Follow-up outpatient                - Baseline urinary incontinent with pads at nighttime and briefs during daytime 11.  Macular degeneration.  Resume Ocuvite.                - Pt legally blind, sees "shadows" only beyond 12 in, but can   navigate hallways and performs ADLs independently at baseline   12. Hx cardioembolic stroke d/t Afib, no residual deficits 13. Mild confusion-imporved, Pt alert and oriented x4, suspect related to oxycodone dose-will discontinue for now and monitor  -resolved    14.  Dysphagia, presbyesophagus, appreciate GI eval ,no interventions planned , f/u Dr Watt Climes as OP LOS: 9 days A FACE TO FACE EVALUATION WAS PERFORMED  Charlett Blake 03/23/2022, 8:33 AM

## 2022-03-23 NOTE — Progress Notes (Signed)
Occupational Therapy Session Note  Patient Details  Name: Justin Potter MRN: [id number] Date of Birth: 08-08-1925  Today's Date: 03/23/2022 OT Individual Time: 7829-5621 OT Individual Time Calculation (min): 75 min    Short Term Goals: Week 1:  OT Short Term Goal 1 (Week 1): Pt will complete 1/3 toileting steps with no more than min A for balance OT Short Term Goal 1 - Progress (Week 1): Met OT Short Term Goal 2 (Week 1): Pt will using AE PRN to dress LB with min A OT Short Term Goal 2 - Progress (Week 1): Progressing toward goal OT Short Term Goal 3 (Week 1): Pt will complete toilet transfer with CGA using LRAD OT Short Term Goal 3 - Progress (Week 1): Met Week 2:  OT Short Term Goal 1 (Week 2): STGs = LTGs (LB dressing downgraded to min A)  Skilled Therapeutic Interventions/Progress Updates:    Pt received in bed stating "oh I had a terrible night" and mumbling as he was lethargic. It took pt awhile to get started. Pt kept saying "I cant get out of bed without your help". "Oh I am so helpless". With lots of encouragement and light tactile cues to rotate upper torso pt was able to sit up to EOB.  He stood from elevated bed with close supervision to RW.  Had pt stand and work on weight shifts and extending knees and spine to prep for ambulation.   Pt did walk to toilet with light CGA.  His brief was wet with urine but he was able to void a small BM in toilet (documented in flow sheets).  Pt stood to wipe self with close S.  He then ambulated to shower to bathe with close S using long sponge and light CGA as he stood to wash his bottom. He then ambulated to wc to dress.  Today he was able to get his shorts on himself using the hook of the shoe horn to help over his R foot. Pt stood and pulled pants up with supervision! Total A with compression hose and mod with shoes.   Pt resting in wc at end of session with belt alarm on.  Reviewed with pt all of his successes today and how he is improving  with his independence and not helpless. Pt was asked how he was feeling at end of session. "You know, I feel pretty darn good!". Pt smiling and in good spirits.  Belt alarm on and all needs met.   Therapy Documentation Precautions:  Precautions Precautions: Fall Restrictions Weight Bearing Restrictions: Yes RLE Weight Bearing: Weight bearing as tolerated Other Position/Activity Restrictions: legally blind  Pain: Pain Assessment Pain Score: 2  - premedicated by nursing ADL: ADL Eating: Set up Where Assessed-Eating: Bed level Grooming: (P) Setup (Pt able to complete oral care with set-up assist while seated in the w/c.) Where Assessed-Grooming: (P) Wheelchair, Sitting at sink Upper Body Bathing: Supervision/safety (Pt able to bathe all UB parts with supervision while seated in the tub-bench in the shower. Pt requires assistance to retrieve washcloths and sequence due to decreased vision.) Where Assessed-Upper Body Bathing: Shower Lower Body Bathing: Supervision/safety Where Assessed-Lower Body Bathing: Shower Upper Body Dressing: Setup Where Assessed-Upper Body Dressing: Wheelchair Lower Body Dressing: Minimal assistance (supervision with shorts with AE, A with TEDs and shoes) Where Assessed-Lower Body Dressing: (P) Edge of bed Toileting: Contact guard Where Assessed-Toileting: Glass blower/designer: Therapist, music Method: Counselling psychologist: Grab bars, Raised toilet seat Tub/Shower Transfer:  Unable to assess Tub/Shower Transfer Method: Unable to assess Social research officer, government: Curator Method: Heritage manager: Grab bars, Transfer tub bench   Therapy/Group: Individual Therapy  [place] 03/23/2022, 10:13 AM

## 2022-03-24 NOTE — Progress Notes (Signed)
Occupational Therapy Session Note  Patient Details  Name: Justin Potter MRN: [id number] Date of Birth: Feb 21, 1926  Today's Date: 03/24/2022 OT Individual Time: 0761-5183 OT Individual Time Calculation (min): 60 min    Short Term Goals: Week 2:  OT Short Term Goal 1 (Week 2): STGs = LTGs (LB dressing downgraded to min A)  Skilled Therapeutic Interventions/Progress Updates:    Pt received sitting on toilet with NT present. Pt completed voiding bowel movement (documented in flow sheets). Pt was able to stand with S and cleanse himself with supervision using moist cloths.  He then ambulated to wc with light CGA to S with RW to don pant. Continues to need cues to not push the walker too far in front of him.   Used hook of shoe horn to get pants on with no A, supervision to stand and pull over hips. Assisted pt with compression socks and then pt was able to don shoes independently.  He ambulated to sink to stand and brush teeth at sink. Tolerated standing well but does need cues to stand upright as he tends to let his knees bend too much decreasing his stability.   Returned to wc to work on UE strength using weighted dowel bar 2lbs for chest presses, sh presses, elb flexion and elbow extension.  Pt participated well.   Pt resting in wc with belt alarm on and all needs met.   Therapy Documentation Precautions:  Precautions Precautions: Fall Restrictions Weight Bearing Restrictions: Yes RLE Weight Bearing: Weight bearing as tolerated Other Position/Activity Restrictions: legally blind   Pain: Pain Assessment Pain Scale: 0-10 Pain Score: 5  Faces Pain Scale: Hurts a little bit Pain Type: Chronic pain;Acute pain Pain Location: Hip Pain Orientation: Right Pain Descriptors / Indicators: Aching;Pressure;Discomfort Pain Onset: On-going Patients Stated Pain Goal: 2 Pain Intervention(s): Medication (See eMAR);Cold applied ADL: ADL Eating: Independent Where Assessed-Eating: Bed  level Grooming: Supervision/safety Where Assessed-Grooming: Standing at sink Upper Body Bathing: Supervision/safety (Pt able to bathe all UB parts with supervision while seated in the tub-bench in the shower. Pt requires assistance to retrieve washcloths and sequence due to decreased vision.) Where Assessed-Upper Body Bathing: Shower Lower Body Bathing: Supervision/safety Where Assessed-Lower Body Bathing: Shower Upper Body Dressing: Setup Where Assessed-Upper Body Dressing: Wheelchair Lower Body Dressing: Setup, Supervision/safety (set up with compression socks but able to don shorts and shoes with supervision and use of long shoe horn) Where Assessed-Lower Body Dressing: (P) Edge of bed Toileting: Supervision/safety Where Assessed-Toileting: Glass blower/designer: Close supervision Armed forces technical officer Method: Counselling psychologist: Grab bars, Raised toilet seat Tub/Shower Transfer: Unable to assess Tub/Shower Transfer Method: Unable to assess [employer] Transfer: Curator Method: Heritage manager: Grab bars, Transfer tub bench ADL Comments: .   Therapy/Group: Individual Therapy  Emmons Toth 03/24/2022, 11:38 AM

## 2022-03-24 NOTE — Progress Notes (Signed)
Physical Therapy Session Note  Patient Details  Name: Justin Potter MRN: [id number] Date of Birth: Oct 13, 1925  Today's Date: 03/24/2022 PT Individual Time: 1050-1200 and 1636-1730 PT Individual Time Calculation (min): 70 min and 54 min   Short Term Goals: Week 1:  PT Short Term Goal 1 (Week 1): Pt will perform bed mobility with min assist PT Short Term Goal 1 - Progress (Week 1): Met PT Short Term Goal 2 (Week 1): Pt will ambulate 30f with min assist PT Short Term Goal 2 - Progress (Week 1): Met PT Short Term Goal 3 (Week 1): Pt will improve TUG to < 273m PT Short Term Goal 3 - Progress (Week 1): Met Week 2:  PT Short Term Goal 1 (Week 2): STG=LTG due to ELOS  Skilled Therapeutic Interventions/Progress Updates:     Session 1.  Pt received sitting in WC and agreeable to PT. WC mobility x [id number] cues for safety through doorway due to visual deficits.    Gait training in rehab gym x [id number] cues for posture and step on height on the RLE. Pt reports feeling RLE feel tight following prolonted time in sitting in WC this AM.   Nustep reciprocal movement and endurance training 5 min +4 min with therapeutic rest break  between bouts with cues for full ROM into extension on the RLE. Pt reports reduced tightness in RLE.   Stand pivot [facility] throughout session with RW  Standing therex in parallel bars: HS Curl x 10 BLE, hip abduction x 5 +3, mini squat x 10, semi tandem stand with intermittent UE support 2 x 10 sec bil cues for full ROM and posture throughout session   Patient returned to room and [facility] in reach and all needs met.     Session 2.  Pt received supine in bed and agreeable to PT. Supine>sit transfer with supervision assist and cues for RLE positioning  Ambulatory [facility] in bathroom for urination with RW and supervision assist. Ambulatory [facility] due to mild LOB as pt  adjusting pants while ambulating. Pt able to perform all clothing management standign at toilet with supervision assist and cues for UE support on rails intermittently for safety   Pt transported today room. Dynamic gait training to step over 1.5 inch obstacles on floor 2 x 6 with cues for posture, AD management and improved gait pattern to reduce fall risk.   Simulated car transfer training mod assist for BLE management into SUV height. WC mobility training through hall with supervision assist x [id number] returned to room and [facility] reach and all needs met.      Therapy Documentation Precautions:  Precautions Precautions: Fall Restrictions Weight Bearing Restrictions: Yes RLE Weight Bearing: Weight bearing as tolerated Other Position/Activity Restrictions: legally blind  Pain:  2/10 R hip. "Tight" ambulation increased     Therapy/Group: Individual Therapy  AustiLorie Phenix2023, 2:17 PM

## 2022-03-24 NOTE — Progress Notes (Signed)
PROGRESS NOTE   Subjective/Complaints:  Patient seen sitting in WC at bedside. No complaints, does have some chronic difficulty sleeping but feels well rested today.    ROS: Denies fevers, chills, N/V, abdominal pain, constipation, diarrhea, SOB, cough, chest pain, new weakness or paraesthesias.    Objective:   No results found. No results for input(s): "WBC", "HGB", "HCT", "PLT" in the last 72 hours.  No results for input(s): "NA", "K", "CL", "CO2", "GLUCOSE", "BUN", "CREATININE", "CALCIUM" in the last 72 hours.   Intake/Output Summary (Last 24 hours) at 03/24/2022 1049 Last data filed at 03/24/2022 0836 Gross per 24 hour  Intake 720 ml  Output 150 ml  Net 570 ml         Physical Exam: Vital Signs Blood pressure (!) 168/81, pulse (!) 58, temperature 97.6 F (36.4 C), temperature source Oral, resp. rate 16, height '6\' 2"'$  (1.88 m), weight 81.6 kg, SpO2 100 %.  General: No acute distress, in chair eating lunch Mood and affect are appropriate, pleasant Heart: Regular rate and rhythm no rubs murmurs or extra sounds Lungs: Clear to auscultation, breathing unlabored, no rales or wheezes Abdomen: Positive bowel sounds, soft nontender to palpation, nondistended Extremities: No clubbing, cyanosis, or edema Skin: No evidence of breakdown, no evidence of rash Neurologic: Alert and oriented x4, answers president correctly, answers age correctly, Follows commands, Cranial nerves II through XII intact, motor strength is 5/5 in bilateral UE and LLE; RLE 4/5 d/t hip pain   Musculoskeletal: RLE ROM limited in knee extension d/t pain, but normal passive ROM. Nontender on palpation of right thigh, TFL, hip.   Assessment/Plan: 1. Functional deficits which require 3+ hours per day of interdisciplinary therapy in a comprehensive inpatient rehab setting. Physiatrist is providing close team supervision and 24 hour management of active medical  problems listed below. Physiatrist and rehab team continue to assess barriers to discharge/monitor patient progress toward functional and medical goals  Care Tool:  Bathing    Body parts bathed by patient: Right arm, Left arm, Abdomen, Chest, Right upper leg, Left upper leg, Face, Front perineal area, Buttocks, Right lower leg, Left lower leg   Body parts bathed by helper: Right lower leg, Left lower leg, Front perineal area     Bathing assist Assist Level: Contact Guard/Touching assist     Upper Body Dressing/Undressing Upper body dressing   What is the patient wearing?: Pull over shirt    Upper body assist Assist Level: Supervision/Verbal cueing    Lower Body Dressing/Undressing Lower body dressing      What is the patient wearing?: Pants, Incontinence brief     Lower body assist Assist for lower body dressing: Supervision/Verbal cueing (Supervision with pants, A with brief as pt does not have his pull ups here)     Toileting Toileting    Toileting assist Assist for toileting: Supervision/Verbal cueing     Transfers Chair/bed transfer  Transfers assist  Chair/bed transfer activity did not occur: Safety/medical concerns  Chair/bed transfer assist level: Supervision/Verbal cueing     Locomotion Ambulation   Ambulation assist      Assist level: Contact Guard/Touching assist Assistive device: Walker-rolling Max distance: 175 ft  Walk 10 feet activity   Assist     Assist level: Contact Guard/Touching assist Assistive device: Walker-rolling   Walk 50 feet activity   Assist Walk 50 feet with 2 turns activity did not occur: Safety/medical concerns  Assist level: Contact Guard/Touching assist Assistive device: Walker-rolling    Walk 150 feet activity   Assist Walk 150 feet activity did not occur: Safety/medical concerns  Assist level: Contact Guard/Touching assist Assistive device: Walker-rolling    Walk 10 feet on uneven surface   activity   Assist Walk 10 feet on uneven surfaces activity did not occur: Safety/medical concerns         Wheelchair     Assist Is the patient using a wheelchair?: Yes Type of Wheelchair: Manual    Wheelchair assist level: Supervision/Verbal cueing Max wheelchair distance: 150    Wheelchair 50 feet with 2 turns activity    Assist        Assist Level: Supervision/Verbal cueing   Wheelchair 150 feet activity     Assist      Assist Level: Supervision/Verbal cueing   Blood pressure (!) 168/81, pulse (!) 58, temperature 97.6 F (36.4 C), temperature source Oral, resp. rate 16, height '6\' 2"'$  (1.88 m), weight 81.6 kg, SpO2 100 %.  Medical Problem List and Plan: 1. Functional deficits secondary to large intramuscular hematoma right gluteus maximus muscle measuring at least 11 x 5.6 x 11.4 cm after fall             -patient may  shower             9/4 d/c date, to [place] will need additional hired help in [place] section               - Son (retired Scientist, clinical (histocompatibility and immunogenetics)) lives 2-3  min away and available for assistance.               2.  Antithrombotics: -DVT/anticoagulation:  Pharmaceutical: Eliquis - resumed Monday 8/21; HgB stable at ~8     Latest Ref Rng & Units 03/20/2022    7:23 AM 03/18/2022    6:31 AM 03/17/2022    6:27 AM  CBC  WBC 4.0 - 10.5 K/uL 8.9   8.8   Hemoglobin 13.0 - 17.0 g/dL 9.0  8.1  8.1   Hematocrit 39.0 - 52.0 % 28.0  25.0  25.2   Platelets 150 - 400 K/uL 367   256                -antiplatelet therapy: N/A 3. Pain Management: Low-dose oxycodone as needed- has no rest pain but pain with movement such as rolling               - Takes Tylenol 1000 mg BID at baseline for chronic back pain  -Right glute pain controled with current medications  - DC oxycodone, continue tylenol for now  -Pain well controlled 4. Mood/Behavior/Sleep: Provide emotional support             -antipsychotic agents: N/A 5. Neuropsych/cognition: This patient is  capable of making decisions on his own behalf. 6. Skin/Wound Care: Routine skin checks                            - Sees podiatry for foot care as OP 7. Fluids/Electrolytes/Nutrition: Routine in and out with follow-up chemistry               - Diet advancing  from full liquid 8/22- appears to be tolerating diet 8.  Acute blood loss anemia related hematoma.  - Cont to monitor, recheck in am will need outpt CBC ~1wk post d/c  -HBG stable at 8.1 on 03/18/22                - Monitor hematoma closely for s/s enlargement 9.  Atrial fibrillation as well as bradycardia.  Eliquis resumed.  Follow-up with EP cardiology team.  No current plans for pacer                - Irregular HR but stable rate, monitor    - Mild Loletha Grayer 8/25, asymptomatic, continue to monitor  10.  History of prostate cancer with radioactive seed implant.  Follow-up outpatient                - Baseline urinary incontinent with pads at nighttime and briefs during daytime 11.  Macular degeneration.  Resume Ocuvite.                - Pt legally blind, sees "shadows" only beyond 12 in, but can   navigate hallways and performs ADLs independently at baseline   12. Hx cardioembolic stroke d/t Afib, no residual deficits 13. Mild confusion-improved, suspect related to oxycodone dose-will discontinue for now and monitor  -resolved    14.  Dysphagia, presbyesophagus, appreciate GI eval ,no interventions planned , f/u Dr Watt Climes as OP  LOS: 10 days A FACE TO FACE EVALUATION WAS [place] 03/24/2022, 10:49 AM

## 2022-03-24 NOTE — Progress Notes (Shared)
Occupational Therapy Discharge Summary  Patient Details  Name: Justin Potter MRN: [id number] Date of Birth: 10-22-1925 Today's Date: 03/26/22 OT Individual Time: 1330-1430 OT Individual Time Calculation (min): 60 min  Date of Discharge from OT service:March 26, 2022  Upon OT arrival, pt seated in recliner and reports no pain. Pt agreeable to OT treatment session. Pt completes full shower ADL at the levels listed below. Pt completes functional mobility with 4WW with CGA-Supervision level and functional transfers with CGA-Supervision level. Pt limited by weakness and decreased endurance and continues to benefit from OT services upon discharge. Pt was left in recliner at end of session with all needs met and safety measures in place.    Patient has met 8 of 8 long term goals due to improved activity tolerance, improved balance, postural control, and ability to compensate for deficits.  Patient to discharge at overall min A with LB dressing and Supervision level with toileting, bathing, basic transfers.  Patient's care partner (assistance provided by his ILF) is independent to provide the necessary physical and cognitive assistance at discharge.    Reasons goals not met: n/a  Recommendation:  Patient will benefit from ongoing skilled OT services in  ILF  to continue to advance functional skills in the area of BADL and iADL.  Equipment: No equipment provided  Reasons for discharge: treatment goals met  Patient/family agrees with progress made and goals achieved: Yes  OT Discharge Precautions/Restrictions  Precautions Precautions: Fall Restrictions RLE Weight Bearing: Weight bearing as tolerated Other Position/Activity Restrictions: legally blind  ADL ADL Eating: Independent Where Assessed-Eating: Bed level Grooming: Supervision/safety Where Assessed-Grooming: Chair Upper Body Bathing: Supervision/safety Where Assessed-Upper Body Bathing: Shower Lower Body Bathing:  Supervision/safety Where Assessed-Lower Body Bathing: Shower Upper Body Dressing: Contact guard Where Assessed-Upper Body Dressing: Other (Comment) (shower chair) Lower Body Dressing: Minimal assistance Where Assessed-Lower Body Dressing: Chair Toileting: Supervision/safety Where Assessed-Toileting: Glass blower/designer: Close supervision Armed forces technical officer Method: Counselling psychologist: Grab bars, Raised toilet seat Tub/Shower Transfer: Unable to assess Tub/Shower Transfer Method: Unable to assess Social research officer, government: Close supervision Social research officer, government Method: [employer]: Grab bars, Transfer tub bench ADL Comments: . Vision Baseline Vision/History: 1 Wears glasses;2 Legally blind Patient Visual Report: No change from baseline Perception  Perception: Within Functional Limits Praxis Praxis: Intact Cognition Cognition Overall Cognitive Status: Within Functional Limits for tasks assessed Arousal/Alertness: Awake/alert Orientation Level: Person;Place;Situation Person: Oriented Place: Oriented Situation: Oriented Memory: Impaired Memory Impairment: Decreased short term memory Decreased Short Term Memory: Verbal complex Attention: Focused Focused Attention: Appears intact Awareness: Appears intact Problem Solving: Appears intact Safety/Judgment: Appears intact Brief Interview for Mental Status (BIMS) Repetition of Three Words (First Attempt): 3 Temporal Orientation: Year: Correct Temporal Orientation: Month: Accurate within 5 days Temporal Orientation: Day: Correct Recall: "Sock": Yes, no cue required Recall: "Blue": Yes, no cue required Recall: "Bed": Yes, after cueing ("a piece of furniture") BIMS Summary Score: 14 Sensation Sensation Peripheral sensation comments: mild decreaesd appreciation to light touch on the distal RLE and at site of hematoma Hot/Cold: Appears Intact Proprioception: Appears Intact Stereognosis:  Impaired by gross assessment (due to limited [place] from arthritis) Coordination Gross Motor Movements are Fluid and Coordinated: No Fine Motor Movements are Fluid and Coordinated: No Coordination and Movement Description: limited by pain on the RLE; [place] limited by arthritis Finger Nose Finger Test: difficult to assess 2/2 vision impairment Motor  Motor Motor - Discharge Observations: improved strength and activity tolerance from admission. Able to tolerate standing for  several minutes at a time and now able to tolerate reaching forward toward feet in sitting. Mobility  Transfers Sit to Stand: Supervision/Verbal cueing  Trunk/Postural Assessment  Postural Control Postural Control: Deficits on evaluation (decreased trunk extension and knee extension and standing which limits his dynamic postural control, able to stand with no UE support with close S.)  Balance Dynamic Sitting Balance Dynamic Sitting - Level of Assistance: 6: Modified independent (Device/Increase time) Static Standing Balance Static Standing - Level of Assistance: 5: Stand by assistance Dynamic Standing Balance Dynamic Standing - Level of Assistance: 5: Stand by assistance Extremity/Trunk Assessment RUE Assessment Active Range of Motion (AROM) Comments: WFL, hand AROM limited 2/2 arthritic changes General Strength Comments: 4-/5, 3-/5 hand grip LUE Assessment Active Range of Motion (AROM) Comments: WFL, hand AROM limited 2/2 arthritic changes General Strength Comments: 4-/5 grossly, 3-/5 hand grasp   [place] 03/24/2022, 9:28 AM

## 2022-03-25 NOTE — Progress Notes (Signed)
Physical Therapy Discharge Summary  Patient Details  Name: Justin Potter MRN: [id number] Date of Birth: 1926-02-16  Date of Discharge from PT service:March 26, 2022  Today's Date: 03/26/2022 PT Individual Time: 1114-1200 PT Individual Time Calculation (min): 46 min    Patient has met 9 of 10 long term goals due to improved activity tolerance, improved balance, increased strength, increased range of motion, decreased pain, and functional use of  right lower extremity.  Patient to discharge at an ambulatory level Supervision.   Patient's care partner is independent to provide the necessary physical assistance at discharge.  Reasons goals not met: pain in the R hip limits increased independence with ROM in the RLE for bed mobility   Recommendation:  Patient will benefit from ongoing skilled PT services in home health setting to continue to advance safe functional mobility, address ongoing impairments in balance, strength, endurance, pain management, gait, and minimize fall risk.  Equipment: No equipment provided  Reasons for discharge: treatment goals met and discharge from hospital  Patient/family agrees with progress made and goals achieved: Yes  PT Discharge Precautions/Restrictions   Vital Signs  Pain Pain Assessment Pain Scale: 0-10 Pain Score: 4  Pain Intervention(s): Medication (See eMAR) Pain Interference   Vision/Perception     Cognition   Sensation   Motor     Mobility   Locomotion     Trunk/Postural Assessment     Balance   Extremity Assessment            [name] 03/26/2022, 12:20 PM

## 2022-03-25 NOTE — Progress Notes (Signed)
Physical Therapy Session Note  Patient Details  Name: Justin Potter MRN: [id number] Date of Birth: [birth year]  Today's Date: 03/25/2022 PT Individual Time: 0915-1000 PT Individual Time Calculation (min): 45 min   Short Term Goals: Week 1:  PT Short Term Goal 1 (Week 1): Pt will perform bed mobility with min assist PT Short Term Goal 1 - Progress (Week 1): Met PT Short Term Goal 2 (Week 1): Pt will ambulate 100f with min assist PT Short Term Goal 2 - Progress (Week 1): Met PT Short Term Goal 3 (Week 1): Pt will improve TUG to < 227m PT Short Term Goal 3 - Progress (Week 1): Met Week 2:  PT Short Term Goal 1 (Week 2): STG=LTG due to ELOS Week 3:     Skilled Therapeutic Interventions/Progress Updates:   Pt received supine in bed and agreeable to PT. Donning Teds and shoes in supine with assist from PT for time management. Supine>sit transfer with supervision assist and cues  assist and cues for management of the RLE. [facility] and BM. Pt able to performed hygiene and all clothing management with increased time and supervision assist from PT. Gait training in Room  to sink to with supervision assist and RW x [id number]  performed hand and facial hygiene sitting in sink with set up assist from PT including facial wash and shave. Patient  [facility] reach and all needs met.        Therapy Documentation Precautions:  Precautions Precautions: Fall Restrictions Weight Bearing Restrictions: Yes RLE Weight Bearing: Weight bearing as tolerated Other Position/Activity Restrictions: legally blind  Vital Signs: Therapy Vitals Temp: 97.6 F (36.4 C) Pulse Rate: (!) 53 Resp: 16 BP: 111/71 Patient Position (if appropriate): Sitting Oxygen Therapy SpO2: 100 % O2 Device: Room Air Pain:  1/10 R hip. Sore. Pt repositioned.    Therapy/Group: Individual Therapy  AusLorie Phenix2/2023, 6:15 PM

## 2022-03-25 NOTE — Progress Notes (Signed)
Occupational Therapy Session Note  Patient Details  Name: Justin Potter MRN: [id number] Date of Birth: [birth year]  Today's Date: 03/25/2022  Short Term Goals: Week 2:  OT Short Term Goal 1 (Week 2): STGs = LTGs (LB dressing downgraded to min A)  Skilled Therapeutic Interventions/Progress Updates:    Pt missed 30 minutes of OT treatment time secondary to facility related emergency. Will make up minutes as able.   General: General OT Amount of Missed Time: 30 Minutes   Therapy/Group: Individual Therapy  Marvetta Gibbons 03/25/2022, 12:28 PM

## 2022-03-26 LAB — CBC WITH DIFFERENTIAL/PLATELET
Abs Immature Granulocytes: 0.02 10*3/uL (ref 0.00–0.07)
Basophils Absolute: 0.1 10*3/uL (ref 0.0–0.1)
Basophils Relative: 1 %
Eosinophils Absolute: 0.1 10*3/uL (ref 0.0–0.5)
Eosinophils Relative: 1 %
HCT: 33.8 % — ABNORMAL LOW (ref 39.0–52.0)
Hemoglobin: 10.8 g/dL — ABNORMAL LOW (ref 13.0–17.0)
Immature Granulocytes: 0 %
Lymphocytes Relative: 21 %
Lymphs Abs: 2 10*3/uL (ref 0.7–4.0)
MCH: 31.2 pg (ref 26.0–34.0)
MCHC: 32 g/dL (ref 30.0–36.0)
MCV: 97.7 fL (ref 80.0–100.0)
Monocytes Absolute: 0.9 10*3/uL (ref 0.1–1.0)
Monocytes Relative: 9 %
Neutro Abs: 6.5 10*3/uL (ref 1.7–7.7)
Neutrophils Relative %: 68 %
Platelets: 390 10*3/uL (ref 150–400)
RBC: 3.46 MIL/uL — ABNORMAL LOW (ref 4.22–5.81)
RDW: 15.3 % (ref 11.5–15.5)
WBC: 9.5 10*3/uL (ref 4.0–10.5)
nRBC: 0 % (ref 0.0–0.2)

## 2022-03-26 LAB — BASIC METABOLIC PANEL WITH GFR
Anion gap: 6 (ref 5–15)
BUN: 21 mg/dL (ref 8–23)
CO2: 25 mmol/L (ref 22–32)
Calcium: 9.1 mg/dL (ref 8.9–10.3)
Chloride: 108 mmol/L (ref 98–111)
Creatinine, Ser: 0.9 mg/dL (ref 0.61–1.24)
GFR, Estimated: >60 mL/min
Glucose, Bld: 110 mg/dL — ABNORMAL HIGH (ref 70–99)
Potassium: 4.4 mmol/L (ref 3.5–5.1)
Sodium: 139 mmol/L (ref 135–145)

## 2022-03-26 LAB — URINALYSIS, ROUTINE W REFLEX MICROSCOPIC
Bilirubin Urine: NEGATIVE
Glucose, UA: NEGATIVE mg/dL
Hgb urine dipstick: NEGATIVE
Ketones, ur: NEGATIVE mg/dL
Nitrite: NEGATIVE
Protein, ur: NEGATIVE mg/dL
Specific Gravity, Urine: 1.025 (ref 1.005–1.030)
pH: 5 (ref 5.0–8.0)

## 2022-03-26 NOTE — Progress Notes (Signed)
Inpatient Rehabilitation Discharge Medication Review by a Pharmacist  A complete drug regimen review was completed for this patient to identify any potential clinically significant medication issues.  High Risk Drug Classes Is patient taking? Indication by Medication  Antipsychotic No   Anticoagulant Yes Eliquis - Afib  Antibiotic No   Opioid No   Antiplatelet No   Hypoglycemics/insulin No   Vasoactive Medication No   Chemotherapy No   Other No      Type of Medication Issue Identified Description of Issue Recommendation(s)  Drug Interaction(s) (clinically significant)     Duplicate Therapy     Allergy     No Medication Administration End Date     Incorrect Dose     Additional Drug Therapy Needed     Significant med changes from prior encounter (inform family/care partners about these prior to discharge).    Other       Clinically significant medication issues were identified that warrant physician communication and completion of prescribed/recommended actions by midnight of the next day:  No  Pharmacist comments: None  Time spent performing this drug regimen review (minutes):  30 minutes  Thank you Anette Guarneri, PharmD 03/26/2022 4:05 PM

## 2022-03-26 NOTE — Progress Notes (Signed)
Physical Therapy Session Note  Patient Details  Name: Justin Potter MRN: [id number] Date of Birth: 27-May-1926  Today's Date: 03/26/2022 PT Individual Time: 1114-1200 PT Individual Time Calculation (min): 46 min   Short Term Goals: Week 2:  PT Short Term Goal 1 (Week 2): STG=LTG due to ELOS  Skilled Therapeutic Interventions/Progress Updates: Pt presents supine in bed w/ son present.  Son states some increased confusion today and pt did state recognizing this PT although never worked with him.  Introduced myself again but still states recognition.  Pt transfers sup to sit w/ min A for R LE rolling to left.  Pt required threading feet into shorts and then able to pull to hips.  Pt required A to don compression socks and shoes.  Pt transfers slowly from lowered bed height to stand w/ supervision.  Pt then able to pull pants over hips w/ supervision for safety.  Pt amb 180' w/ RW and supervision to main gym.  Pt requires occasional verbal cues for hand placement for eccentric control stand to sit.  Pt negotiated 4 steps w/ B rails and min A, step-to gait pattern w/ CGA for descending.  Pt amb to mat table for seated rest.  Pt amb 180' back to room, supervision, but verbal cues for upright posture and foot clearance.  Pt appears to be answering questions appropriately.  Pt performed SPT bed > recliner w/ supervision.  Pt remained sitting in recliner w/ legs elevated, and all needs in reach, seat alarm on.      Therapy Documentation Precautions:  Precautions Precautions: Fall Restrictions Weight Bearing Restrictions: Yes RLE Weight Bearing: Weight bearing as tolerated Other Position/Activity Restrictions: legally blind General:   Vital Signs:  Pain:2/10 Pain Assessment Pain Scale: 0-10 Pain Score: 4  Pain Intervention(s): Medication (See eMAR)    Therapy/Group: Individual Therapy  [name] 03/26/2022, 12:02 PM

## 2022-03-26 NOTE — Progress Notes (Signed)
PROGRESS NOTE   Subjective/Complaints:  Pt reports doesn't feel well- his bottom is "burning" and painful.  Also hip 'sore as can be".   They are coming with meds, but wondering if anything else can be done to treat.   ROS:  Pt denies SOB, abd pain, CP, N/V/C/D, and vision changes   Objective:   No results found. No results for input(s): "WBC", "HGB", "HCT", "PLT" in the last 72 hours.  No results for input(s): "NA", "K", "CL", "CO2", "GLUCOSE", "BUN", "CREATININE", "CALCIUM" in the last 72 hours.   Intake/Output Summary (Last 24 hours) at 03/26/2022 1036 Last data filed at 03/26/2022 0900 Gross per 24 hour  Intake 694 ml  Output --  Net 694 ml        Physical Exam: Vital Signs Blood pressure (!) 159/78, pulse 62, temperature 97.8 F (36.6 C), temperature source Oral, resp. rate 20, height '6\' 2"'$  (1.88 m), weight 81.6 kg, SpO2 100 %.    General: awake, alert, appropriate, c/o pain- propped pt with pillow- he felt a little better- NAD HENT: conjugate gaze; oropharynx moist CV: regular rate; no JVD Pulmonary: CTA B/L; no W/R/R- good air movement GI: soft, NT, ND, (+)BS Psychiatric: appropriate Neurological: alert Extremities: No clubbing, cyanosis, or edema Skin: No evidence of breakdown, no evidence of rash Neurologic: Alert and oriented x4, answers president correctly, answers age correctly, Follows commands, Cranial nerves II through XII intact, motor strength is 5/5 in bilateral UE and LLE; RLE 4/5 d/t hip pain   Musculoskeletal: RLE ROM limited in knee extension d/t pain, but normal passive ROM. Nontender on palpation of right thigh, TFL, hip.   Assessment/Plan: 1. Functional deficits which require 3+ hours per day of interdisciplinary therapy in a comprehensive inpatient rehab setting. Physiatrist is providing close team supervision and 24 hour management of active medical problems listed below. Physiatrist  and rehab team continue to assess barriers to discharge/monitor patient progress toward functional and medical goals  Care Tool:  Bathing    Body parts bathed by patient: Right arm, Left arm, Abdomen, Chest, Right upper leg, Left upper leg, Face, Front perineal area, Buttocks, Right lower leg, Left lower leg   Body parts bathed by helper: Right lower leg, Left lower leg, Front perineal area     Bathing assist Assist Level: Contact Guard/Touching assist     Upper Body Dressing/Undressing Upper body dressing   What is the patient wearing?: Pull over shirt    Upper body assist Assist Level: Supervision/Verbal cueing    Lower Body Dressing/Undressing Lower body dressing      What is the patient wearing?: Pants, Incontinence brief     Lower body assist Assist for lower body dressing: Supervision/Verbal cueing (Supervision with pants, A with brief as pt does not have his pull ups here)     Toileting Toileting    Toileting assist Assist for toileting: Supervision/Verbal cueing     Transfers Chair/bed transfer  Transfers assist  Chair/bed transfer activity did not occur: Safety/medical concerns  Chair/bed transfer assist level: Supervision/Verbal cueing     Locomotion Ambulation   Ambulation assist      Assist level: Supervision/Verbal cueing Assistive device: Walker-rolling Max  distance: 180   Walk 10 feet activity   Assist     Assist level: Supervision/Verbal cueing Assistive device: Walker-rolling   Walk 50 feet activity   Assist Walk 50 feet with 2 turns activity did not occur: Safety/medical concerns  Assist level: Supervision/Verbal cueing Assistive device: Walker-rolling    Walk 150 feet activity   Assist Walk 150 feet activity did not occur: Safety/medical concerns  Assist level: Supervision/Verbal cueing Assistive device: Walker-rolling    Walk 10 feet on uneven surface  activity   Assist Walk 10 feet on uneven surfaces activity  did not occur: Safety/medical concerns   Assist level: Contact Guard/Touching assist Assistive device: Walker-rolling   Wheelchair     Assist Is the patient using a wheelchair?: Yes Type of Wheelchair: Manual    Wheelchair assist level: Supervision/Verbal cueing Max wheelchair distance: 150    Wheelchair 50 feet with 2 turns activity    Assist        Assist Level: Supervision/Verbal cueing   Wheelchair 150 feet activity     Assist      Assist Level: Supervision/Verbal cueing   Blood pressure (!) 159/78, pulse 62, temperature 97.8 F (36.6 C), temperature source Oral, resp. rate 20, height '6\' 2"'$  (1.88 m), weight 81.6 kg, SpO2 100 %.  Medical Problem List and Plan: 1. Functional deficits secondary to large intramuscular hematoma right gluteus maximus muscle measuring at least 11 x 5.6 x 11.4 cm after fall             -patient may  shower             9/4 d/c date, to [place] will need additional hired help in [place] section               - Son (retired Scientist, clinical (histocompatibility and immunogenetics)) lives 2-3  min away and available for assistance.              Con't CIR- PT and OT 2.  Antithrombotics: -DVT/anticoagulation:  Pharmaceutical: Eliquis - resumed Monday 8/21; HgB stable at ~8     Latest Ref Rng & Units 03/20/2022    7:23 AM 03/18/2022    6:31 AM 03/17/2022    6:27 AM  CBC  WBC 4.0 - 10.5 K/uL 8.9   8.8   Hemoglobin 13.0 - 17.0 g/dL 9.0  8.1  8.1   Hematocrit 39.0 - 52.0 % 28.0  25.0  25.2   Platelets 150 - 400 K/uL 367   256                -antiplatelet therapy: N/A 3. Pain Management: Low-dose oxycodone as needed- has no rest pain but pain with movement such as rolling               - Takes Tylenol 1000 mg BID at baseline for chronic back pain  -Right glute pain controled with current medications  - DC oxycodone, continue tylenol for now  -Con't Pain reigmen- needs to take meds regularly to keep well controlled 4. Mood/Behavior/Sleep: Provide emotional support              -antipsychotic agents: N/A 5. Neuropsych/cognition: This patient is capable of making decisions on his own behalf. 6. Skin/Wound Care: Routine skin checks                            - Sees podiatry for foot care as OP 7. Fluids/Electrolytes/Nutrition: Routine in and out with follow-up chemistry               -  Diet advancing from full liquid 8/22- appears to be tolerating diet 8.  Acute blood loss anemia related hematoma.  - Cont to monitor, recheck in am will need outpt CBC ~1wk post d/c  -HBG stable at 8.1 on 03/18/22                - Monitor hematoma closely for s/s enlargement 9.  Atrial fibrillation as well as bradycardia.  Eliquis resumed.  Follow-up with EP cardiology team.  No current plans for pacer                - Irregular HR but stable rate, monitor    - Mild Loletha Grayer 8/25, asymptomatic, continue to monitor  10.  History of prostate cancer with radioactive seed implant.  Follow-up outpatient                - Baseline urinary incontinent with pads at nighttime and briefs during daytime 11.  Macular degeneration.  Resume Ocuvite.                - Pt legally blind, sees "shadows" only beyond 12 in, but can   navigate hallways and performs ADLs independently at baseline   12. Hx cardioembolic stroke d/t Afib, no residual deficits 13. Mild confusion-improved, suspect related to oxycodone dose-will discontinue for now and monitor  -resolved    14.  Dysphagia, presbyesophagus, appreciate GI eval ,no interventions planned , f/u Dr Watt Climes as OP  LOS: 12 days A FACE TO FACE EVALUATION WAS PERFORMED  Demyah Smyre 03/26/2022, 10:36 AM

## 2022-03-27 DIAGNOSIS — R1319 Other dysphagia: Secondary | ICD-10-CM

## 2022-03-27 DIAGNOSIS — R5381 Other malaise: Principal | ICD-10-CM

## 2022-03-27 NOTE — Progress Notes (Signed)
Inpatient Rehabilitation Care Coordinator Discharge Note   Patient Details  Name: Justin Potter MRN: [id number] Date of Birth: March 16, 1926   Discharge location: ILF- [facility]  Length of Stay: 13 Days  Discharge activity level: Mod I/Sup  Home/community participation: Hired assistance/son  Patient response GB:MBOMQT Literacy - How often do you need to have someone help you when you read instructions, pamphlets, or other written material from your doctor or pharmacy?: Often  Patient response TC:NGFREV Isolation - How often do you feel lonely or isolated from those around you?: Never  Services provided included: MD, RD, PT, SLP, OT, RN, CM, TR, Pharmacy, SW  Financial Services:  Financial Services Utilized: Medicare    Choices offered to/list presented to: pt/son  Follow-up services arranged:  [place]: ILF preference         Patient response to transportation need: Is the patient able to respond to transportation needs?: Yes In the past 12 months, has lack of transportation kept you from medical appointments or from getting medications?: No In the past 12 months, has lack of transportation kept you from meetings, work, or from getting things needed for daily living?: No    Comments (or additional information):  Patient/Family verbalized understanding of follow-up arrangements:  Yes  Individual responsible for coordination of the follow-up plan: [place] [phone]  Confirmed correct DME delivered: [name] 03/27/2022    [name]

## 2022-03-27 NOTE — Progress Notes (Signed)
PROGRESS NOTE   Subjective/Complaints:  No new complaints. Excited to be going home today  ROS: Patient denies fever, rash, sore throat, blurred vision, dizziness, nausea, vomiting, diarrhea, cough, shortness of breath or chest pain, joint or back/neck pain, headache, or mood change.   Objective:   No results found. Recent Labs    03/26/22 1207  WBC 9.5  HGB 10.8*  HCT 33.8*  PLT 390    Recent Labs    03/26/22 1207  NA 139  K 4.4  CL 108  CO2 25  GLUCOSE 110*  BUN 21  CREATININE 0.90  CALCIUM 9.1     Intake/Output Summary (Last 24 hours) at 03/27/2022 1019 Last data filed at 03/27/2022 0733 Gross per 24 hour  Intake 297 ml  Output --  Net 297 ml        Physical Exam: Vital Signs Blood pressure (!) 154/80, pulse (!) 59, temperature 97.6 F (36.4 C), resp. rate 20, height '6\' 2"'$  (1.88 m), weight 81.6 kg, SpO2 100 %.    Constitutional: No distress . Vital signs reviewed. HEENT: NCAT, EOMI, oral membranes moist Neck: supple Cardiovascular: RRR without murmur. No JVD    Respiratory/Chest: CTA Bilaterally without wheezes or rales. Normal effort    GI/Abdomen: BS +, non-tender, non-distended Ext: no clubbing, cyanosis, or edema Psych: pleasant and cooperative  Extremities: No clubbing, cyanosis, or edema Skin: No evidence of breakdown, no evidence of rash Neurologic: Alert and oriented x4, answers president correctly, answers age correctly, Follows commands, Cranial nerves II through XII intact, motor strength is 5/5 in bilateral UE and LLE; RLE 4/5 d/t hip pain   Musculoskeletal: RLE ROM limited in knee extension d/t pain, but normal passive ROM. Nontender on palpation of right thigh, TFL, hip.   Assessment/Plan: 1. Functional deficits which require 3+ hours per day of interdisciplinary therapy in a comprehensive inpatient rehab setting. Physiatrist is providing close team supervision and 24 hour  management of active medical problems listed below. Physiatrist and rehab team continue to assess barriers to discharge/monitor patient progress toward functional and medical goals  Care Tool:  Bathing    Body parts bathed by patient: Right arm, Left arm, Abdomen, Chest, Right upper leg, Left upper leg, Face, Front perineal area, Buttocks, Right lower leg, Left lower leg   Body parts bathed by helper: Right lower leg, Left lower leg, Front perineal area     Bathing assist Assist Level: Supervision/Verbal cueing     Upper Body Dressing/Undressing Upper body dressing   What is the patient wearing?: Pull over shirt    Upper body assist Assist Level: Supervision/Verbal cueing    Lower Body Dressing/Undressing Lower body dressing      What is the patient wearing?: Pants, Incontinence brief     Lower body assist Assist for lower body dressing: Supervision/Verbal cueing     Toileting Toileting    Toileting assist Assist for toileting: Supervision/Verbal cueing     Transfers Chair/bed transfer  Transfers assist  Chair/bed transfer activity did not occur: Safety/medical concerns  Chair/bed transfer assist level: Supervision/Verbal cueing     Locomotion Ambulation   Ambulation assist      Assist level: Supervision/Verbal cueing  Assistive device: Walker-rolling Max distance: 180   Walk 10 feet activity   Assist     Assist level: Supervision/Verbal cueing Assistive device: Walker-rolling   Walk 50 feet activity   Assist Walk 50 feet with 2 turns activity did not occur: Safety/medical concerns  Assist level: Supervision/Verbal cueing Assistive device: Walker-rolling    Walk 150 feet activity   Assist Walk 150 feet activity did not occur: Safety/medical concerns  Assist level: Supervision/Verbal cueing Assistive device: Walker-rolling    Walk 10 feet on uneven surface  activity   Assist Walk 10 feet on uneven surfaces activity did not occur:  Safety/medical concerns   Assist level: Contact Guard/Touching assist Assistive device: Walker-rolling   Wheelchair     Assist Is the patient using a wheelchair?: Yes Type of Wheelchair: Manual    Wheelchair assist level: Supervision/Verbal cueing Max wheelchair distance: 150    Wheelchair 50 feet with 2 turns activity    Assist        Assist Level: Supervision/Verbal cueing   Wheelchair 150 feet activity     Assist      Assist Level: Supervision/Verbal cueing   Blood pressure (!) 154/80, pulse (!) 59, temperature 97.6 F (36.4 C), resp. rate 20, height '6\' 2"'$  (1.88 m), weight 81.6 kg, SpO2 100 %.  Medical Problem List and Plan: 1. Functional deficits secondary to large intramuscular hematoma right gluteus maximus muscle measuring at least 11 x 5.6 x 11.4 cm after fall             -patient may  shower             9/4 today to [facility] will need additional hired help in [place] section               - Son (retired Scientist, clinical (histocompatibility and immunogenetics)) lives 2-3  min away and available for assistance.              Con't CIR- PT and OT 2.  Antithrombotics: -DVT/anticoagulation:  Pharmaceutical: Eliquis - resumed Monday 8/21; HgB stable at ~8     Latest Ref Rng & Units 03/26/2022   12:07 PM 03/20/2022    7:23 AM 03/18/2022    6:31 AM  CBC  WBC 4.0 - 10.5 K/uL 9.5  8.9    Hemoglobin 13.0 - 17.0 g/dL 10.8  9.0  8.1   Hematocrit 39.0 - 52.0 % 33.8  28.0  25.0   Platelets 150 - 400 K/uL 390  367                 -antiplatelet therapy: N/A 3. Pain Management: Low-dose oxycodone as needed- has no rest pain but pain with movement such as rolling               - Takes Tylenol 1000 mg BID at baseline for chronic back pain  -Right glute pain controled with current medications  - DC oxycodone, continue tylenol for now  -Con't Pain reigmen- needs to take meds regularly to keep well controlled 4. Mood/Behavior/Sleep: Provide emotional support             -antipsychotic agents: N/A 5.  Neuropsych/cognition: This patient is capable of making decisions on his own behalf. 6. Skin/Wound Care: Routine skin checks                            - Sees podiatry for foot care as OP 7. Fluids/Electrolytes/Nutrition: Routine in and out with follow-up chemistry               -  Diet advancing from full liquid 8/22- appears to be tolerating diet  -labs WNL yesterday 8.  Acute blood loss anemia related hematoma.  - Cont to monitor, recheck in am will need outpt CBC ~1wk post d/c  -HBG up to 10.8 9/3                - Monitor hematoma closely for s/s enlargement 9.  Atrial fibrillation as well as bradycardia.  Eliquis resumed.  Follow-up with EP cardiology team.  No current plans for pacer                - Irregular HR but stable rate, monitor    - Mild Loletha Grayer 8/25, asymptomatic, continue to monitor  10.  History of prostate cancer with radioactive seed implant.  Follow-up outpatient                - Baseline urinary incontinent with pads at nighttime and briefs during daytime 11.  Macular degeneration.  Resume Ocuvite.                - Pt legally blind, sees "shadows" only beyond 12 in, but can   navigate hallways and performs ADLs independently at baseline   12. Hx cardioembolic stroke d/t Afib, no residual deficits 13. Mild confusion-improved, suspect related to oxycodone dose-will discontinue for now and monitor  -resolved    14.  Dysphagia, presbyesophagus, appreciate GI eval ,no interventions planned , f/u Dr Watt Climes as OP  LOS: 13 days A FACE TO FACE EVALUATION WAS PERFORMED  Meredith Staggers 03/27/2022, 10:19 AM

## 2022-03-27 NOTE — NC FL2 (Signed)
[place] LEVEL OF CARE SCREENING TOOL     IDENTIFICATION  Patient Name: Justin Potter Birthdate: 11-Aug-1925 Sex: male Admission Date (Current Location): 03/14/2022  [facility] and [place] Number:  Herbalist and Address:  [facility], [place] [address], [place],  [address]      Provider Number:    Attending Physician Name and Address:  Charlett Blake, MD  Relative Name and Phone Number:  Arik Husmann    Current Level of Care: Hospital Recommended Level of Care: Other (Comment) (ILF) Prior Approval Number:    Date Approved/Denied:   PASRR Number:    Discharge Plan: Other (Comment) (ILF)    Current Diagnoses: Patient Active Problem List   Diagnosis Date Noted   Intramuscular hematoma 03/14/2022   Hematoma 03/13/2022   Junctional bradycardia 03/12/2022   Hematoma of right hip 03/12/2022   Dysphagia 11/02/2020   Dysuria 11/02/2020   Acquired thrombophilia ([place]) 11/02/2020   Advanced nonexudative age-related macular degeneration of left eye with subfoveal involvement 05/26/2020   Exudative age-related macular degeneration of right eye with active choroidal neovascularization ([place]) 05/26/2020   Exudative age-related macular degeneration of left eye with inactive choroidal neovascularization ([place]) 05/26/2020   Advanced nonexudative age-related macular degeneration of right eye with subfoveal involvement 05/26/2020   Choroidal neovascularization of right eye 05/26/2020   Pain in right knee 10/07/2019   Long term current use of anticoagulant therapy 09/19/2013   Arthritis    S/P laparoscopic appendectomy Jan 2015 08/28/2013   Hyperlipidemia    Essential hypertension    Persistent atrial fibrillation (HCC)     Orientation RESPIRATION BLADDER Height & Weight     Self, Time, Situation, Place  Normal Continent Weight: 179 lb 14.3 oz (81.6 kg) Height:  '6\' 2"'$  (188 cm)  BEHAVIORAL SYMPTOMS/MOOD NEUROLOGICAL BOWEL  NUTRITION STATUS      Continent Diet  AMBULATORY STATUS COMMUNICATION OF NEEDS Skin   Supervision   Normal                       Personal Care Assistance Level of Assistance  Bathing, Dressing Bathing Assistance: Limited assistance   Dressing Assistance: Limited assistance     Functional Limitations Info             [place]  PT (By licensed PT), OT (By licensed OT), Speech therapy                    Contractures      Additional Factors Info                  Current Medications (03/27/2022):  This is the current hospital active medication list Current Facility-Administered Medications  Medication Dose Route Frequency Provider Last Rate Last Admin   acetaminophen (TYLENOL) tablet 650 mg  650 mg Oral Q6H PRN Cathlyn Parsons, PA-C   650 mg at 03/27/22 9983   Or   acetaminophen (TYLENOL) suppository 650 mg  650 mg Rectal Q6H PRN Angiulli, Lavon Paganini, PA-C       apixaban Arne [place]) tablet 5 mg  5 mg Oral BID AngiulliLavon Paganini, PA-C   5 mg at 03/27/22 0827   multivitamin (PROSIGHT) tablet 1 tablet  1 tablet Oral Daily Pham, Minh Q, RPH-CPP   1 tablet at 03/27/22 0827   polyethylene glycol (MIRALAX / GLYCOLAX) packet 17 g  17 g Oral Daily Angiulli, Lavon Paganini, PA-C  17 g at 03/27/22 0827     Discharge Medications: Please see discharge summary for a list of discharge medications.  Relevant Imaging Results:  Relevant Lab Results:   Additional Information    [name]

## 2022-03-27 NOTE — Progress Notes (Signed)
Patient ID: Justin Potter, male   DOB: Mar 02, 1926, 88 y.o.   MRN: [id number]  D/C Summary and AVS sent to [facility].

## 2022-03-28 ENCOUNTER — Other Ambulatory Visit: Payer: Self-pay

## 2022-03-28 ENCOUNTER — Emergency Department (HOSPITAL_COMMUNITY): Payer: Medicare Other

## 2022-03-28 ENCOUNTER — Encounter (HOSPITAL_COMMUNITY): Payer: Self-pay | Admitting: Emergency Medicine

## 2022-03-28 ENCOUNTER — Emergency Department (HOSPITAL_COMMUNITY)
Admission: EM | Admit: 2022-03-28 | Discharge: 2022-03-28 | Disposition: A | Discharge status: Home / Self Care | Payer: Medicare Other | Attending: Emergency Medicine | Admitting: Emergency Medicine

## 2022-03-28 DIAGNOSIS — M25551 Pain in right hip: Secondary | ICD-10-CM | POA: Diagnosis not present

## 2022-03-28 DIAGNOSIS — Z7901 Long term (current) use of anticoagulants: Secondary | ICD-10-CM | POA: Insufficient documentation

## 2022-03-28 DIAGNOSIS — M7989 Other specified soft tissue disorders: Secondary | ICD-10-CM | POA: Diagnosis not present

## 2022-03-28 DIAGNOSIS — M25751 Osteophyte, right hip: Secondary | ICD-10-CM | POA: Diagnosis not present

## 2022-03-28 DIAGNOSIS — I10 Essential (primary) hypertension: Secondary | ICD-10-CM | POA: Insufficient documentation

## 2022-03-28 DIAGNOSIS — W19XXXA Unspecified fall, initial encounter: Secondary | ICD-10-CM | POA: Diagnosis not present

## 2022-03-28 DIAGNOSIS — R001 Bradycardia, unspecified: Secondary | ICD-10-CM | POA: Diagnosis not present

## 2022-03-28 DIAGNOSIS — R202 Paresthesia of skin: Secondary | ICD-10-CM | POA: Diagnosis not present

## 2022-03-28 DIAGNOSIS — S300XXA Contusion of lower back and pelvis, initial encounter: Secondary | ICD-10-CM | POA: Diagnosis not present

## 2022-03-28 DIAGNOSIS — M7981 Nontraumatic hematoma of soft tissue: Secondary | ICD-10-CM | POA: Diagnosis not present

## 2022-03-28 LAB — CBC
HCT: 34.5 % — ABNORMAL LOW (ref 39.0–52.0)
Hemoglobin: 10.7 g/dL — ABNORMAL LOW (ref 13.0–17.0)
MCH: 30.9 pg (ref 26.0–34.0)
MCHC: 31 g/dL (ref 30.0–36.0)
MCV: 99.7 fL (ref 80.0–100.0)
Platelets: 329 10*3/uL (ref 150–400)
RBC: 3.46 MIL/uL — ABNORMAL LOW (ref 4.22–5.81)
RDW: 15.2 % (ref 11.5–15.5)
WBC: 10.3 10*3/uL (ref 4.0–10.5)
nRBC: 0 % (ref 0.0–0.2)

## 2022-03-28 LAB — BASIC METABOLIC PANEL
Anion gap: 8 (ref 5–15)
BUN: 24 mg/dL — ABNORMAL HIGH (ref 8–23)
CO2: 25 mmol/L (ref 22–32)
Calcium: 9.1 mg/dL (ref 8.9–10.3)
Chloride: 106 mmol/L (ref 98–111)
Creatinine, Ser: 1.04 mg/dL (ref 0.61–1.24)
GFR, Estimated: >60 mL/min (ref >60)
Glucose, Bld: 105 mg/dL — ABNORMAL HIGH (ref 70–99)
Potassium: 3.9 mmol/L (ref 3.5–5.1)
Sodium: 139 mmol/L (ref 135–145)

## 2022-03-28 MED ORDER — ACETAMINOPHEN 325 MG PO TABS
650.0000 mg | ORAL_TABLET | Freq: Once | ORAL | Status: AC
  Administered 2022-03-28: 650 mg via ORAL
  Filled 2022-03-28: qty 2

## 2022-03-28 MED ORDER — GADOBUTROL 1 MMOL/ML IV SOLN
7.5000 mL | Freq: Once | INTRAVENOUS | Status: AC | PRN
Start: 2022-03-28 — End: 2022-03-28
  Administered 2022-03-28: 7.5 mL via INTRAVENOUS

## 2022-03-28 MED ORDER — ACETAMINOPHEN 325 MG PO TABS
650.0000 mg | ORAL_TABLET | Freq: Once | ORAL | Status: AC
Start: 2022-03-28 — End: 2022-03-28
  Administered 2022-03-28: 650 mg via ORAL
  Filled 2022-03-28: qty 2

## 2022-03-28 NOTE — ED Provider Notes (Addendum)
[facility] EMERGENCY DEPARTMENT Provider Note   CSN: [id number] Arrival date & time: 03/28/22  0601     History  Chief Complaint  Patient presents with   Hip Pain     Justin Potter is a 88 y.o. male with junctional bradycardia, persistent atrial fibrillation on Eliquis, hypertension, hyperlipidemia, hematoma of the right hip related to a fall with recent hospitalization presenting for right hip pain.  Patient stated that right hip pain started 2 weeks ago after a fall.  He was evaluated in the emergency department.  CT of the right hip at that time showed hematoma.  Also during his evaluation he was noted to be hypotensive and bradycardic into the 30s and was admitted for EP evaluation.  Was discharged yesterday.  Patient states that the pain has just worsened over the last day since his discharge somewhat so that he has trouble walking.  This prompted further evaluation in the ED today.  Patient denies urinary incontinence, saddle anesthesia, fever.  Denies any recent trauma since his discharge from the hospital.      Home Medications Prior to Admission medications   Medication Sig Start Date End Date Taking? Authorizing Provider  acetaminophen (TYLENOL) 325 MG tablet Take 2 tablets (650 mg total) by mouth every 6 (six) hours as needed for mild pain (or Fever >/= 101). 03/23/22   Angiulli, Lavon Paganini, PA-C  ELIQUIS 5 MG TABS tablet Take 1 tablet (5 mg total) by mouth 2 (two) times daily. 03/23/22   Angiulli, Lavon Paganini, PA-C  Multiple Vitamins-Minerals (OCUVITE PRESERVISION PO) Take 1 tablet by mouth every morning.     [provider]  Pediatric Multiple Vit-C-FA (ANIMAL CHEWABLE MULTIVITAMIN PO) Take 1 tablet by mouth daily.    [provider]  polyethylene glycol (MIRALAX / GLYCOLAX) 17 g packet Take 17 g by mouth daily. 03/15/22   Danford, Suann Larry, MD      Allergies    Patient has no known allergies.    Review of Systems   Review of Systems   Musculoskeletal:        Right hip pain    Physical Exam Updated Vital Signs BP (!) 146/63   Pulse 84   Temp 97.8 F (36.6 C) (Oral)   Resp 14   SpO2 94%  Physical Exam Vitals and nursing note reviewed.  HENT:     Head: Normocephalic and atraumatic.     Mouth/Throat:     Mouth: Mucous membranes are moist.  Eyes:     General:        Right eye: No discharge.        Left eye: No discharge.     Conjunctiva/sclera: Conjunctivae normal.  Cardiovascular:     Rate and Rhythm: Normal rate and regular rhythm.     Pulses: Normal pulses.     Heart sounds: Normal heart sounds.  Pulmonary:     Effort: Pulmonary effort is normal.     Breath sounds: Normal breath sounds.  Abdominal:     General: Abdomen is flat.     Palpations: Abdomen is soft.  Musculoskeletal:     Right hip: Tenderness present.     Left hip: Normal.     Comments: Noted moderately sized ecchymosis on the right buttock.  Did not assess range of motion and strength of hips.  Patient deferred due to pain.  Normal strength and sensation of the knees, ankles, and feet.  Patient also deferred gait assessment due to pain.  Skin:    General: Skin is warm and dry.  Neurological:     General: No focal deficit present.  Psychiatric:        Mood and Affect: Mood normal.     ED Results / Procedures / Treatments   Labs (all labs ordered are listed, but only abnormal results are displayed) Labs Reviewed  CBC - Abnormal; Notable for the following components:      Result Value   RBC 3.46 (*)    Hemoglobin 10.7 (*)    HCT 34.5 (*)    All other components within normal limits  BASIC METABOLIC PANEL - Abnormal; Notable for the following components:   Glucose, Bld 105 (*)    BUN 24 (*)    All other components within normal limits    EKG None  Radiology MR HIP RIGHT W WO CONTRAST  Result Date: 03/28/2022 CLINICAL DATA:  Hip pain.  Infection suspected. EXAM: MRI OF THE RIGHT HIP WITHOUT AND WITH CONTRAST TECHNIQUE:  Multiplanar, multisequence MR imaging was performed both before and after administration of intravenous contrast. CONTRAST:  7.57m GADAVIST GADOBUTROL 1 MMOL/ML IV SOLN COMPARISON:  Pelvis and right hip radiographs 03/28/2022; CT right hip 03/12/2022 and 03/09/2022 FINDINGS: Bones: No acute fracture. Moderate bilateral femoroacetabular joint space narrowing and cartilage thinning. Small 11 mm centrally heterogeneous T2 and intermediate and decreased T1 signal 11 mm lesion within the superior anterior right acetabulum is unchanged from multiple prior CTs including 08/20/2013 and consistent with a benign lesion. This may represent an enchondroma (axial series 14, image 12 and coronal series 8, image 24). Articular cartilage and labrum Articular cartilage: Moderate to high-grade superior right femoral head and acetabular cartilage thinning. Mild-to-moderate right femoral head-neck junction circumferential degenerative osteophytes. Mild-to-moderate anterior superior right femoral head-neck junction CAM-type bump deformity. Labrum: There estrogen of blunting of the peripheral aspect of the superior and anterior superior portions of the right acetabular labrum. Multiple areas of curvilinear increased T2 signal likely degenerative tears. Joint or bursal effusion Joint effusion:  No joint effusion within either hip. Bursae: Mild right and minimal left fluid within the trochanteric bursae. Muscles and tendons Muscles and tendons: There is mild-to-moderate right and mild left fluid deep to the iliacus muscles, just anterior to the bilateral iliac cortices. Other findings Miscellaneous: Within the right gluteus maximus region there is a heterogeneous, lobular collection with central heterogeneous intermediate to decreased T2 and decreased T1 signal with peripheral increased T2 and T1 signal, and a thin peripheral decreased T1 decreased T2 signal border most suggestive of a hematoma with chronic central and subacute peripheral  components. No definite enhancement. At the superior anterior aspect there are multiple small slightly separated hematomas extending into the posterior aspect of the gluteus medius muscle. There is moderate overlying lateral right hip subcutaneous fat edema and swelling. Mild-to-moderate sigmoid colon diverticulosis. Mild right L3-4 disc space narrowing. IMPRESSION: 1. Posterolateral right buttock predominantly right gluteus maximus subacute to chronic intramuscular hematoma appears not significantly changed in size from 03/12/2022 CT. 2. No rim enhancing abscess is seen. 3. No right femoroacetabular joint effusion. No cortical erosion to indicate MRI evidence of osteomyelitis. 4. Mild-to-moderate right and mild left fluid deep to the iliacus muscles. Electronically Signed   By: RYvonne KendallM.D.   On: 03/28/2022 12:30   DG Hip Unilat W or Wo Pelvis 2-3 Views Right  Result Date: 03/28/2022 CLINICAL DATA:  Right hip pain EXAM: DG HIP (WITH OR WITHOUT PELVIS) 2-3V RIGHT COMPARISON:  Right hip CT  03/12/2022 FINDINGS: There is no evidence of acute fracture. Alignment is normal. There is moderate bilateral hip osteoarthritis with cam deformities. Lower lumbar spine degenerative changes. Soft tissue prominence of the right gluteal region. Prostate brachytherapy seeds noted. IMPRESSION: No evidence of acute fracture. Moderate osteoarthritis of the hips. Prominence of the right gluteal soft tissues. Electronically Signed   By: Maurine Simmering M.D.   On: 03/28/2022 08:12    Procedures Procedures    Medications Ordered in ED Medications  acetaminophen (TYLENOL) tablet 650 mg (650 mg Oral Given 03/28/22 0610)  acetaminophen (TYLENOL) tablet 650 mg (650 mg Oral Given 03/28/22 1013)  gadobutrol (GADAVIST) 1 MMOL/ML injection 7.5 mL (7.5 mLs Intravenous Contrast Given 03/28/22 1140)    ED Course/ Medical Decision Making/ A&P                           Medical Decision Making Amount and/or Complexity of Data  Reviewed Labs: ordered. Radiology: ordered.  Risk OTC drugs. Prescription drug management.   This patient presents to the ED for concern of hip pain, this involves a number of treatment options, and is a complaint that carries with it a moderate risk of complications and morbidity.  The differential diagnosis includes fracture, deep space abscess, and osteomyelitis.   Co morbidities: Discussed in HPI    EMR reviewed including pt PMHx, past surgical history and past visits to ER.   See HPI for more details   Lab Tests:   I ordered and independently interpreted labs. Labs notable for hyperglycemia, anemia   Imaging Studies:  Abnormal findings. I personally reviewed all imaging studies. Imaging notable for stable hematoma of right buttock.  And mild to moderate fluid deep to the iliacus muscle.  Moderate arthritis in both hip joints.    Cardiac Monitoring:  The patient was maintained on a cardiac monitor.  I personally viewed and interpreted the cardiac monitored which showed an underlying rhythm of: Irregularly irregular rhythm concerning for atrial fibrillation EKG non-ischemic   Medicines ordered:  I ordered medication including Tylenol for pain. Reevaluation of the patient after these medicines showed that the patient stayed the same I have reviewed the patients home medicines and have made adjustments as needed      Consults/Attending Physician   I discussed this case with my attending physician who cosigned this note including patient's presenting symptoms, physical exam, and planned diagnostics and interventions. Attending physician stated agreement with plan or made changes to plan which were implemented.   Reevaluation:  After the interventions noted above I re-evaluated patient and found that they have :stayed the same  Problem List / ED Course:  Patient for presented for right hip pain.  Chart review revealed recent discharge for same complaint.   Patient endorsed worsening pain unable to bear weight on the right hip prompted further evaluation.  Investigated with MRI for possible osteomyelitis or abscess status post recent hospitalization.  MRI was overall unremarkable.  Had a shared decision making discussion with patient and son decided that it would be appropriate to transfer back via EMS to facility provided that they seek out 24-hour assistance for patient especially with ambulation considering right hand pain is worsened since discharge from hospital.  Recommended Tylenol as needed for pain management.    Dispostion:  After consideration of the diagnostic results and the patients response to treatment, I feel that the patent would benefit from discharge home, continue PT and OT, recommend Tylenol for pain.  Final Clinical Impression(s) / ED Diagnoses Final diagnoses:  Right hip pain    Rx / DC Orders ED Discharge Orders     None         Harriet Pho, PA-C 03/28/22 1348    Harriet Pho, PA-C 03/28/22 1349    Harriet Pho, PA-C 03/28/22 1406    Pattricia Boss, MD 03/28/22 1620

## 2022-03-28 NOTE — Discharge Planning (Addendum)
TOC following for disposition needs.  Pt from [facility] and will receive PT and OT there. Justin Potter, [place], [place], [place]

## 2022-03-28 NOTE — ED Notes (Signed)
Pt assisted to dress, stand and sit into wheelchair. Pt left dept. Via wheelchair with son.

## 2022-03-28 NOTE — Discharge Instructions (Addendum)
Work-up for ongoing right hip pain was overall reassuring.  MRI did not reveal any new acute findings that could be contributing to your pain.  Recommend they continue PT and OT and given pain in the right hip and reduced ability to bear weight assistance with ambulation is likely indicated.  Recommend Tylenol for pain as needed.  If you have new urinary incontinence, bowel incontinence, saddle numbness, new fever please return to the emergency department for further evaluation.

## 2022-03-28 NOTE — ED Notes (Signed)
[facility] called for transport.  

## 2022-03-28 NOTE — ED Notes (Addendum)
Pt right hip and right leg noted to have discoloration of various stages

## 2022-03-28 NOTE — ED Notes (Signed)
Pt son in dept. To transport pt home.

## 2022-03-28 NOTE — ED Triage Notes (Addendum)
Patient arrived with EMS from [place] staff reported persistent  right hip pain for several days , No recent fall .

## 2022-03-28 NOTE — ED Notes (Signed)
Pt HR running 40-50. Dr. Jeanell Sparrow notified. Verbal order for ECG obtained.

## 2022-03-28 NOTE — ED Notes (Signed)
Patient transported to X-ray 

## 2022-03-28 NOTE — ED Notes (Signed)
Called pt son, [place]. Pt sone updated on transport status. PT son reports having a wheelchair at home and pt residence having an elevator. Pt son reports feeling able to take pt home.

## 2022-03-28 NOTE — ED Notes (Signed)
Pt returned from MRI °

## 2022-03-28 NOTE — ED Notes (Signed)
Patient transported to MRI 

## 2022-03-28 NOTE — ED Notes (Addendum)
Reviewed pt d/c instructions with pt and pt son. Pt d/c paperwork given to pt son. Pt son verbalizes understanding. Pt has difficulty ambulating due to pain in right hip. [facility] to be notified for transport. Pt son to be updated at time of transport [phone]

## 2022-03-29 DIAGNOSIS — M6281 Muscle weakness (generalized): Secondary | ICD-10-CM | POA: Diagnosis not present

## 2022-03-29 DIAGNOSIS — M25551 Pain in right hip: Secondary | ICD-10-CM | POA: Diagnosis not present

## 2022-03-29 DIAGNOSIS — R296 Repeated falls: Secondary | ICD-10-CM | POA: Diagnosis not present

## 2022-03-29 DIAGNOSIS — R262 Difficulty in walking, not elsewhere classified: Secondary | ICD-10-CM | POA: Diagnosis not present

## 2022-03-30 DIAGNOSIS — R296 Repeated falls: Secondary | ICD-10-CM | POA: Diagnosis not present

## 2022-03-30 DIAGNOSIS — M25551 Pain in right hip: Secondary | ICD-10-CM | POA: Diagnosis not present

## 2022-03-30 DIAGNOSIS — R262 Difficulty in walking, not elsewhere classified: Secondary | ICD-10-CM | POA: Diagnosis not present

## 2022-03-30 DIAGNOSIS — M6281 Muscle weakness (generalized): Secondary | ICD-10-CM | POA: Diagnosis not present

## 2022-03-31 DIAGNOSIS — M6281 Muscle weakness (generalized): Secondary | ICD-10-CM | POA: Diagnosis not present

## 2022-03-31 DIAGNOSIS — R262 Difficulty in walking, not elsewhere classified: Secondary | ICD-10-CM | POA: Diagnosis not present

## 2022-03-31 DIAGNOSIS — R296 Repeated falls: Secondary | ICD-10-CM | POA: Diagnosis not present

## 2022-03-31 DIAGNOSIS — M25551 Pain in right hip: Secondary | ICD-10-CM | POA: Diagnosis not present

## 2022-04-03 DIAGNOSIS — M6281 Muscle weakness (generalized): Secondary | ICD-10-CM | POA: Diagnosis not present

## 2022-04-03 DIAGNOSIS — M25551 Pain in right hip: Secondary | ICD-10-CM | POA: Diagnosis not present

## 2022-04-03 DIAGNOSIS — R296 Repeated falls: Secondary | ICD-10-CM | POA: Diagnosis not present

## 2022-04-03 DIAGNOSIS — R262 Difficulty in walking, not elsewhere classified: Secondary | ICD-10-CM | POA: Diagnosis not present

## 2022-04-04 DIAGNOSIS — R262 Difficulty in walking, not elsewhere classified: Secondary | ICD-10-CM | POA: Diagnosis not present

## 2022-04-04 DIAGNOSIS — M25551 Pain in right hip: Secondary | ICD-10-CM | POA: Diagnosis not present

## 2022-04-04 DIAGNOSIS — R296 Repeated falls: Secondary | ICD-10-CM | POA: Diagnosis not present

## 2022-04-04 DIAGNOSIS — M6281 Muscle weakness (generalized): Secondary | ICD-10-CM | POA: Diagnosis not present

## 2022-04-05 ENCOUNTER — Ambulatory Visit: Payer: Medicare Other | Admitting: Podiatry

## 2022-04-05 DIAGNOSIS — M6281 Muscle weakness (generalized): Secondary | ICD-10-CM | POA: Diagnosis not present

## 2022-04-05 DIAGNOSIS — R262 Difficulty in walking, not elsewhere classified: Secondary | ICD-10-CM | POA: Diagnosis not present

## 2022-04-05 DIAGNOSIS — R296 Repeated falls: Secondary | ICD-10-CM | POA: Diagnosis not present

## 2022-04-05 DIAGNOSIS — M25551 Pain in right hip: Secondary | ICD-10-CM | POA: Diagnosis not present

## 2022-04-05 NOTE — Progress Notes (Signed)
Cardiology Office Note:    Date:  04/11/2022   ID:  [name], DOB 04/23/1926, MRN [id number]  PCP:  [name], [place] [facility] Cardiologist:  [name], MD     Referring MD: [name], MD   Chief Complaint:  Hospitalization Follow-up     History of Present Illness:   KYSER WANDEL is a 88 y.o. male with history of persistent afib, chronic anticoag, bradycardia HTN but more recent issues with hypotension off home meds presents with leg pains after fall 02/2022. Found to have large right gluteus maximus hemtaoma 11 x 5.6 x 11.4 cm after recent fall. While in ER episode of bradycardia to the 30s with low bp's, cardiology consulted.  Had a minor stroke after stopping eliquis last year for an endoscopy.     Was seen by Dr. Quentin Ore who felt bradycardia was vagally mediated in the setting of severe pain and rates improved.   Patient comes in with his son, Dr. Valere Dross. HR is running low 50's. 48-49 twice a month. BP 98/65-140/95.  Probably not drinking enough water but overall doing well. No cardiac complaints.    Past Medical History:  Diagnosis Date   Anxiety    Arthritis    "in the fingers" per pt   Atrial fibrillation ([place])    Bladder neck contracture    Dysrhythmia    A-FIB / HX BRADYCARDIA - FOLLOWED BY DR. Eden DUE TO [name]   Gross hematuria    History of gout    History of prostate cancer    S/P RADIOACTIVE SEED IMPLANTS   History of squamous cell carcinoma of skin    EXCISION LOWER LIP AND RIGHT EAR   History of TIA (transient ischemic attack)    probable tia no residual  09-09-2012   Hyperlipidemia    Hypertension    Macular degeneration    both eyes   Urethral stricture    Urinary retention    Current Medications: Current Meds  Medication Sig   acetaminophen (TYLENOL) 325 MG tablet Take 2 tablets (650 mg total) by mouth every 6 (six) hours as needed for mild pain (or Fever >/= 101).   ELIQUIS  5 MG TABS tablet Take 1 tablet (5 mg total) by mouth 2 (two) times daily.   Multiple Vitamins-Minerals (OCUVITE PRESERVISION PO) Take 1 tablet by mouth every morning.    Pediatric Multiple Vit-C-FA (ANIMAL CHEWABLE MULTIVITAMIN PO) Take 1 tablet by mouth daily.    Allergies:   Patient has no known allergies.   Social History   Tobacco Use   Smoking status: Former    Packs/day: 1.50    Years: 35.00    Total pack years: 52.50    Types: Cigarettes    Quit date: 01/30/1979    Years since quitting: 43.2   Smokeless tobacco: Never  Vaping Use   Vaping Use: Never used  Substance Use Topics   Alcohol use: Yes    Comment: "5 days per week I drink 1 glass of wine"   Drug use: No    Family Hx: The patient's family history includes Cancer in his father and mother.  ROS   EKGs/Labs/Other Test Reviewed:    EKG:  EKG is  not ordered today.     Recent Labs: 03/12/2022: Magnesium 1.8 03/15/2022: ALT 12 03/28/2022: BUN 24; Creatinine, Ser 1.04; Hemoglobin 10.7; Platelets 329; Potassium 3.9; Sodium 139   Recent Lipid Panel No results for  input(s): "CHOL", "TRIG", "HDL", "VLDL", "LDLCALC", "LDLDIRECT" in the last 8760 hours.   Prior CV Studies:     Monitor 07/2019  Continuous atrial fibrillation. While awake heart rates generally above 50 bpm. Slow ventricular response with nocturnal pauses greater than 3 seconds but less than 4.5 seconds on 60 occasions.       Risk Assessment/Calculations/Metrics:          Physical Exam:    VS:  BP (!) 152/78   Pulse (!) 50   Ht '6\' 2"'$  (1.88 m)   Wt 174 lb (78.9 kg)   SpO2 99%   BMI 22.34 kg/m     Wt Readings from Last 3 Encounters:  04/11/22 174 lb (78.9 kg)  03/14/22 179 lb 14.3 oz (81.6 kg)  03/14/22 179 lb 14.3 oz (81.6 kg)    Physical Exam  GEN: Thin, in no acute distress  Neck: no JVD, carotid bruits, or masses Cardiac:RRR; no murmurs, rubs, or gallops  Respiratory:  clear to auscultation bilaterally, normal work of  breathing GI: soft, nontender, nondistended, + BS Ext: RLE swelling, compression stockings on, , Good distal pulses bilaterally Neuro:  Alert and Oriented x 3,  Psych: euthymic mood, full affect       ASSESSMENT & PLAN:   No problem-specific Assessment & Plan notes found for this encounter.   Afib with HR 30's in ED 02/2022 in the setting of pain with hip hematoma felt to be vasovagal Eliquis held temporarily now back on. Overall stable with HR's in the 50's. No dizziness or presyncope. Last Hgb and Crt stable. No changes.  HTN BP up a little today but runs as low as 81'K systolic. Not on meds and given age will not treat.             Dispo:  No follow-ups on file.   Medication Adjustments/Labs and Tests Ordered: Current medicines are reviewed at length with the patient today.  Concerns regarding medicines are outlined above.  Tests Ordered: No orders of the defined types were placed in this encounter.  Medication Changes: No orders of the defined types were placed in this encounter.  Sumner Boast, PA-C  04/11/2022 10:49 AM    [address], [place], [place]  [id number] Phone: [phone]; Fax: [phone]

## 2022-04-06 ENCOUNTER — Ambulatory Visit (INDEPENDENT_AMBULATORY_CARE_PROVIDER_SITE_OTHER): Payer: Medicare Other | Admitting: Podiatry

## 2022-04-06 ENCOUNTER — Encounter: Payer: Self-pay | Admitting: Podiatry

## 2022-04-06 DIAGNOSIS — M79674 Pain in right toe(s): Secondary | ICD-10-CM

## 2022-04-06 DIAGNOSIS — B351 Tinea unguium: Secondary | ICD-10-CM

## 2022-04-06 DIAGNOSIS — R296 Repeated falls: Secondary | ICD-10-CM | POA: Diagnosis not present

## 2022-04-06 DIAGNOSIS — M6281 Muscle weakness (generalized): Secondary | ICD-10-CM | POA: Diagnosis not present

## 2022-04-06 DIAGNOSIS — M79675 Pain in left toe(s): Secondary | ICD-10-CM

## 2022-04-06 DIAGNOSIS — R262 Difficulty in walking, not elsewhere classified: Secondary | ICD-10-CM | POA: Diagnosis not present

## 2022-04-06 DIAGNOSIS — M25551 Pain in right hip: Secondary | ICD-10-CM | POA: Diagnosis not present

## 2022-04-07 ENCOUNTER — Ambulatory Visit: Payer: Medicare Other | Admitting: Nurse Practitioner

## 2022-04-07 DIAGNOSIS — M6281 Muscle weakness (generalized): Secondary | ICD-10-CM | POA: Diagnosis not present

## 2022-04-07 DIAGNOSIS — R296 Repeated falls: Secondary | ICD-10-CM | POA: Diagnosis not present

## 2022-04-07 DIAGNOSIS — R262 Difficulty in walking, not elsewhere classified: Secondary | ICD-10-CM | POA: Diagnosis not present

## 2022-04-07 DIAGNOSIS — M25551 Pain in right hip: Secondary | ICD-10-CM | POA: Diagnosis not present

## 2022-04-08 NOTE — Progress Notes (Signed)
Subjective:   Patient ID: Justin Potter, male   DOB: 88 y.o.   MRN: [id number]   HPI Patient presents stating that he has nail disease 1-5 both feet and has clotting disorder cannot take care of them himself and they get thick and painful   ROS      Objective:  Physical Exam  Neuro vascular status unchanged with thick yellow brittle nailbeds 1-5 both feet that are moderately tender when pressed     Assessment:  Mycotic nail infection with pain 1-5 both feet     Plan:  Debridement of nailbeds 1-5 both feet no angiogenic bleeding reappoint routine care

## 2022-04-10 DIAGNOSIS — M25551 Pain in right hip: Secondary | ICD-10-CM | POA: Diagnosis not present

## 2022-04-10 DIAGNOSIS — M6281 Muscle weakness (generalized): Secondary | ICD-10-CM | POA: Diagnosis not present

## 2022-04-10 DIAGNOSIS — R262 Difficulty in walking, not elsewhere classified: Secondary | ICD-10-CM | POA: Diagnosis not present

## 2022-04-10 DIAGNOSIS — R296 Repeated falls: Secondary | ICD-10-CM | POA: Diagnosis not present

## 2022-04-11 ENCOUNTER — Ambulatory Visit: Payer: Medicare Other | Attending: Nurse Practitioner | Admitting: Physician Assistant

## 2022-04-11 ENCOUNTER — Encounter: Payer: Self-pay | Admitting: Physician Assistant

## 2022-04-11 VITALS — BP 152/78 | HR 50 | Ht 74.0 in | Wt 174.0 lb

## 2022-04-11 DIAGNOSIS — R296 Repeated falls: Secondary | ICD-10-CM | POA: Diagnosis not present

## 2022-04-11 DIAGNOSIS — I10 Essential (primary) hypertension: Secondary | ICD-10-CM | POA: Diagnosis not present

## 2022-04-11 DIAGNOSIS — M25551 Pain in right hip: Secondary | ICD-10-CM | POA: Diagnosis not present

## 2022-04-11 DIAGNOSIS — I4819 Other persistent atrial fibrillation: Secondary | ICD-10-CM | POA: Insufficient documentation

## 2022-04-11 DIAGNOSIS — M6281 Muscle weakness (generalized): Secondary | ICD-10-CM | POA: Diagnosis not present

## 2022-04-11 DIAGNOSIS — R262 Difficulty in walking, not elsewhere classified: Secondary | ICD-10-CM | POA: Diagnosis not present

## 2022-04-11 NOTE — Patient Instructions (Signed)
Medication Instructions:  Your physician recommends that you continue on your current medications as directed. Please refer to the Current Medication list given to you today.  *If you need a refill on your cardiac medications before your next appointment, please call your pharmacy*   Lab Work: None If you have labs (blood work) drawn today and your tests are completely normal, you will receive your results only by: [place] (if you have MyChart) OR A paper copy in the mail If you have any lab test that is abnormal or we need to change your treatment, we will call you to review the results.   Follow-Up: At [facility], you and your health needs are our priority.  As part of our continuing mission to provide you with exceptional heart care, we have created designated Provider Care Teams.  These Care Teams include your primary Cardiologist (physician) and Advanced Practice Providers (APPs -  Physician Assistants and Nurse Practitioners) who all work together to provide you with the care you need, when you need it.   Your next appointment:    As Scheduled

## 2022-04-12 DIAGNOSIS — M6281 Muscle weakness (generalized): Secondary | ICD-10-CM | POA: Diagnosis not present

## 2022-04-12 DIAGNOSIS — M25551 Pain in right hip: Secondary | ICD-10-CM | POA: Diagnosis not present

## 2022-04-12 DIAGNOSIS — R262 Difficulty in walking, not elsewhere classified: Secondary | ICD-10-CM | POA: Diagnosis not present

## 2022-04-12 DIAGNOSIS — R296 Repeated falls: Secondary | ICD-10-CM | POA: Diagnosis not present

## 2022-04-13 DIAGNOSIS — R296 Repeated falls: Secondary | ICD-10-CM | POA: Diagnosis not present

## 2022-04-13 DIAGNOSIS — M25551 Pain in right hip: Secondary | ICD-10-CM | POA: Diagnosis not present

## 2022-04-13 DIAGNOSIS — R262 Difficulty in walking, not elsewhere classified: Secondary | ICD-10-CM | POA: Diagnosis not present

## 2022-04-13 DIAGNOSIS — M6281 Muscle weakness (generalized): Secondary | ICD-10-CM | POA: Diagnosis not present

## 2022-04-17 DIAGNOSIS — M6281 Muscle weakness (generalized): Secondary | ICD-10-CM | POA: Diagnosis not present

## 2022-04-17 DIAGNOSIS — M25551 Pain in right hip: Secondary | ICD-10-CM | POA: Diagnosis not present

## 2022-04-17 DIAGNOSIS — R262 Difficulty in walking, not elsewhere classified: Secondary | ICD-10-CM | POA: Diagnosis not present

## 2022-04-17 DIAGNOSIS — R296 Repeated falls: Secondary | ICD-10-CM | POA: Diagnosis not present

## 2022-04-18 DIAGNOSIS — R262 Difficulty in walking, not elsewhere classified: Secondary | ICD-10-CM | POA: Diagnosis not present

## 2022-04-18 DIAGNOSIS — R296 Repeated falls: Secondary | ICD-10-CM | POA: Diagnosis not present

## 2022-04-18 DIAGNOSIS — M25551 Pain in right hip: Secondary | ICD-10-CM | POA: Diagnosis not present

## 2022-04-18 DIAGNOSIS — M6281 Muscle weakness (generalized): Secondary | ICD-10-CM | POA: Diagnosis not present

## 2022-04-19 DIAGNOSIS — M25551 Pain in right hip: Secondary | ICD-10-CM | POA: Diagnosis not present

## 2022-04-19 DIAGNOSIS — R296 Repeated falls: Secondary | ICD-10-CM | POA: Diagnosis not present

## 2022-04-19 DIAGNOSIS — M6281 Muscle weakness (generalized): Secondary | ICD-10-CM | POA: Diagnosis not present

## 2022-04-19 DIAGNOSIS — R262 Difficulty in walking, not elsewhere classified: Secondary | ICD-10-CM | POA: Diagnosis not present

## 2022-04-20 DIAGNOSIS — R296 Repeated falls: Secondary | ICD-10-CM | POA: Diagnosis not present

## 2022-04-20 DIAGNOSIS — M25551 Pain in right hip: Secondary | ICD-10-CM | POA: Diagnosis not present

## 2022-04-20 DIAGNOSIS — R262 Difficulty in walking, not elsewhere classified: Secondary | ICD-10-CM | POA: Diagnosis not present

## 2022-04-20 DIAGNOSIS — M6281 Muscle weakness (generalized): Secondary | ICD-10-CM | POA: Diagnosis not present

## 2022-04-21 DIAGNOSIS — M6281 Muscle weakness (generalized): Secondary | ICD-10-CM | POA: Diagnosis not present

## 2022-04-21 DIAGNOSIS — M25551 Pain in right hip: Secondary | ICD-10-CM | POA: Diagnosis not present

## 2022-04-21 DIAGNOSIS — R262 Difficulty in walking, not elsewhere classified: Secondary | ICD-10-CM | POA: Diagnosis not present

## 2022-04-21 DIAGNOSIS — R296 Repeated falls: Secondary | ICD-10-CM | POA: Diagnosis not present

## 2022-04-24 DIAGNOSIS — M25551 Pain in right hip: Secondary | ICD-10-CM | POA: Diagnosis not present

## 2022-04-24 DIAGNOSIS — M6281 Muscle weakness (generalized): Secondary | ICD-10-CM | POA: Diagnosis not present

## 2022-04-24 DIAGNOSIS — R296 Repeated falls: Secondary | ICD-10-CM | POA: Diagnosis not present

## 2022-04-24 DIAGNOSIS — R262 Difficulty in walking, not elsewhere classified: Secondary | ICD-10-CM | POA: Diagnosis not present

## 2022-04-25 DIAGNOSIS — M6281 Muscle weakness (generalized): Secondary | ICD-10-CM | POA: Diagnosis not present

## 2022-04-25 DIAGNOSIS — R262 Difficulty in walking, not elsewhere classified: Secondary | ICD-10-CM | POA: Diagnosis not present

## 2022-04-25 DIAGNOSIS — M25551 Pain in right hip: Secondary | ICD-10-CM | POA: Diagnosis not present

## 2022-04-25 DIAGNOSIS — R296 Repeated falls: Secondary | ICD-10-CM | POA: Diagnosis not present

## 2022-04-26 DIAGNOSIS — Z85828 Personal history of other malignant neoplasm of skin: Secondary | ICD-10-CM | POA: Diagnosis not present

## 2022-04-26 DIAGNOSIS — M6281 Muscle weakness (generalized): Secondary | ICD-10-CM | POA: Diagnosis not present

## 2022-04-26 DIAGNOSIS — L570 Actinic keratosis: Secondary | ICD-10-CM | POA: Diagnosis not present

## 2022-04-26 DIAGNOSIS — R262 Difficulty in walking, not elsewhere classified: Secondary | ICD-10-CM | POA: Diagnosis not present

## 2022-04-26 DIAGNOSIS — M25551 Pain in right hip: Secondary | ICD-10-CM | POA: Diagnosis not present

## 2022-04-26 DIAGNOSIS — R296 Repeated falls: Secondary | ICD-10-CM | POA: Diagnosis not present

## 2022-04-26 DIAGNOSIS — D225 Melanocytic nevi of trunk: Secondary | ICD-10-CM | POA: Diagnosis not present

## 2022-04-26 DIAGNOSIS — D485 Neoplasm of uncertain behavior of skin: Secondary | ICD-10-CM | POA: Diagnosis not present

## 2022-04-26 DIAGNOSIS — L821 Other seborrheic keratosis: Secondary | ICD-10-CM | POA: Diagnosis not present

## 2022-04-26 DIAGNOSIS — Z85820 Personal history of malignant melanoma of skin: Secondary | ICD-10-CM | POA: Diagnosis not present

## 2022-04-26 DIAGNOSIS — C44212 Basal cell carcinoma of skin of right ear and external auricular canal: Secondary | ICD-10-CM | POA: Diagnosis not present

## 2022-04-27 DIAGNOSIS — M6281 Muscle weakness (generalized): Secondary | ICD-10-CM | POA: Diagnosis not present

## 2022-04-27 DIAGNOSIS — R262 Difficulty in walking, not elsewhere classified: Secondary | ICD-10-CM | POA: Diagnosis not present

## 2022-04-27 DIAGNOSIS — M25551 Pain in right hip: Secondary | ICD-10-CM | POA: Diagnosis not present

## 2022-04-27 DIAGNOSIS — R296 Repeated falls: Secondary | ICD-10-CM | POA: Diagnosis not present

## 2022-04-28 DIAGNOSIS — M25551 Pain in right hip: Secondary | ICD-10-CM | POA: Diagnosis not present

## 2022-04-28 DIAGNOSIS — M6281 Muscle weakness (generalized): Secondary | ICD-10-CM | POA: Diagnosis not present

## 2022-04-28 DIAGNOSIS — R296 Repeated falls: Secondary | ICD-10-CM | POA: Diagnosis not present

## 2022-04-28 DIAGNOSIS — R262 Difficulty in walking, not elsewhere classified: Secondary | ICD-10-CM | POA: Diagnosis not present

## 2022-05-01 DIAGNOSIS — R262 Difficulty in walking, not elsewhere classified: Secondary | ICD-10-CM | POA: Diagnosis not present

## 2022-05-01 DIAGNOSIS — R296 Repeated falls: Secondary | ICD-10-CM | POA: Diagnosis not present

## 2022-05-01 DIAGNOSIS — M25551 Pain in right hip: Secondary | ICD-10-CM | POA: Diagnosis not present

## 2022-05-01 DIAGNOSIS — M6281 Muscle weakness (generalized): Secondary | ICD-10-CM | POA: Diagnosis not present

## 2022-05-03 DIAGNOSIS — R296 Repeated falls: Secondary | ICD-10-CM | POA: Diagnosis not present

## 2022-05-03 DIAGNOSIS — M6281 Muscle weakness (generalized): Secondary | ICD-10-CM | POA: Diagnosis not present

## 2022-05-03 DIAGNOSIS — M25551 Pain in right hip: Secondary | ICD-10-CM | POA: Diagnosis not present

## 2022-05-03 DIAGNOSIS — R262 Difficulty in walking, not elsewhere classified: Secondary | ICD-10-CM | POA: Diagnosis not present

## 2022-05-05 DIAGNOSIS — M25551 Pain in right hip: Secondary | ICD-10-CM | POA: Diagnosis not present

## 2022-05-05 DIAGNOSIS — R262 Difficulty in walking, not elsewhere classified: Secondary | ICD-10-CM | POA: Diagnosis not present

## 2022-05-05 DIAGNOSIS — M6281 Muscle weakness (generalized): Secondary | ICD-10-CM | POA: Diagnosis not present

## 2022-05-05 DIAGNOSIS — R296 Repeated falls: Secondary | ICD-10-CM | POA: Diagnosis not present

## 2022-05-09 DIAGNOSIS — R262 Difficulty in walking, not elsewhere classified: Secondary | ICD-10-CM | POA: Diagnosis not present

## 2022-05-09 DIAGNOSIS — M25551 Pain in right hip: Secondary | ICD-10-CM | POA: Diagnosis not present

## 2022-05-09 DIAGNOSIS — M6281 Muscle weakness (generalized): Secondary | ICD-10-CM | POA: Diagnosis not present

## 2022-05-09 DIAGNOSIS — R296 Repeated falls: Secondary | ICD-10-CM | POA: Diagnosis not present

## 2022-05-10 ENCOUNTER — Ambulatory Visit (INDEPENDENT_AMBULATORY_CARE_PROVIDER_SITE_OTHER): Payer: Medicare Other | Admitting: Podiatry

## 2022-05-10 ENCOUNTER — Encounter: Payer: Self-pay | Admitting: Podiatry

## 2022-05-10 DIAGNOSIS — S90829A Blister (nonthermal), unspecified foot, initial encounter: Secondary | ICD-10-CM

## 2022-05-10 DIAGNOSIS — L089 Local infection of the skin and subcutaneous tissue, unspecified: Secondary | ICD-10-CM

## 2022-05-10 MED ORDER — DOXYCYCLINE HYCLATE 100 MG PO TABS: 100.0000 mg | ORAL_TABLET | Freq: Two times a day (BID) | ORAL | 0 refills | Status: DC

## 2022-05-11 DIAGNOSIS — R296 Repeated falls: Secondary | ICD-10-CM | POA: Diagnosis not present

## 2022-05-11 DIAGNOSIS — M6281 Muscle weakness (generalized): Secondary | ICD-10-CM | POA: Diagnosis not present

## 2022-05-11 DIAGNOSIS — M25551 Pain in right hip: Secondary | ICD-10-CM | POA: Diagnosis not present

## 2022-05-11 DIAGNOSIS — R262 Difficulty in walking, not elsewhere classified: Secondary | ICD-10-CM | POA: Diagnosis not present

## 2022-05-11 NOTE — Progress Notes (Signed)
Subjective:   Patient ID: Justin Potter, male   DOB: 88 y.o.   MRN: [id number]   HPI Patient presents with caregiver after developing blisters on both big toes with redness of the toes.  He does have chronic venous lymphedema type issue with patient wearing stockings and was wearing different types of shoes   ROS      Objective:  Physical Exam  Patient with circulatory issues with irritation of the distal portion of the hallux bilateral low-grade blister formation present that is not draining currently and has not spread from this area     Assessment:  Probability that the blisters are secondary to tight shoe gear with the venous or lymphatic congestion being contributory but appears localized with mild redness noted     Plan:  Reviewed condition and explained the causes and he is now wearing shoes that are open toed which I want him to continue and and I went ahead and I discussed not wearing compression stockings over the toes.  As precautionary measure placed on doxycycline twice daily for 10 days that should be resolved but may take several weeks

## 2022-05-15 DIAGNOSIS — R262 Difficulty in walking, not elsewhere classified: Secondary | ICD-10-CM | POA: Diagnosis not present

## 2022-05-15 DIAGNOSIS — M6281 Muscle weakness (generalized): Secondary | ICD-10-CM | POA: Diagnosis not present

## 2022-05-15 DIAGNOSIS — R296 Repeated falls: Secondary | ICD-10-CM | POA: Diagnosis not present

## 2022-05-15 DIAGNOSIS — M25551 Pain in right hip: Secondary | ICD-10-CM | POA: Diagnosis not present

## 2022-05-16 DIAGNOSIS — R296 Repeated falls: Secondary | ICD-10-CM | POA: Diagnosis not present

## 2022-05-16 DIAGNOSIS — R262 Difficulty in walking, not elsewhere classified: Secondary | ICD-10-CM | POA: Diagnosis not present

## 2022-05-16 DIAGNOSIS — M25551 Pain in right hip: Secondary | ICD-10-CM | POA: Diagnosis not present

## 2022-05-16 DIAGNOSIS — M6281 Muscle weakness (generalized): Secondary | ICD-10-CM | POA: Diagnosis not present

## 2022-05-17 DIAGNOSIS — M25551 Pain in right hip: Secondary | ICD-10-CM | POA: Diagnosis not present

## 2022-05-17 DIAGNOSIS — R262 Difficulty in walking, not elsewhere classified: Secondary | ICD-10-CM | POA: Diagnosis not present

## 2022-05-17 DIAGNOSIS — R296 Repeated falls: Secondary | ICD-10-CM | POA: Diagnosis not present

## 2022-05-17 DIAGNOSIS — M6281 Muscle weakness (generalized): Secondary | ICD-10-CM | POA: Diagnosis not present

## 2022-05-18 DIAGNOSIS — R262 Difficulty in walking, not elsewhere classified: Secondary | ICD-10-CM | POA: Diagnosis not present

## 2022-05-18 DIAGNOSIS — M6281 Muscle weakness (generalized): Secondary | ICD-10-CM | POA: Diagnosis not present

## 2022-05-18 DIAGNOSIS — M25551 Pain in right hip: Secondary | ICD-10-CM | POA: Diagnosis not present

## 2022-05-18 DIAGNOSIS — R296 Repeated falls: Secondary | ICD-10-CM | POA: Diagnosis not present

## 2022-05-19 DIAGNOSIS — I480 Paroxysmal atrial fibrillation: Secondary | ICD-10-CM | POA: Diagnosis not present

## 2022-05-19 DIAGNOSIS — C61 Malignant neoplasm of prostate: Secondary | ICD-10-CM | POA: Diagnosis not present

## 2022-05-19 DIAGNOSIS — F4489 Other dissociative and conversion disorders: Secondary | ICD-10-CM | POA: Diagnosis not present

## 2022-05-19 DIAGNOSIS — I10 Essential (primary) hypertension: Secondary | ICD-10-CM | POA: Diagnosis not present

## 2022-05-21 ENCOUNTER — Emergency Department (HOSPITAL_COMMUNITY): Payer: Medicare Other

## 2022-05-21 ENCOUNTER — Other Ambulatory Visit: Payer: Self-pay

## 2022-05-21 ENCOUNTER — Observation Stay (HOSPITAL_COMMUNITY)
Admission: EM | Admit: 2022-05-21 | Discharge: 2022-05-23 | Disposition: A | Discharge status: Home / Self Care | Payer: Medicare Other | Attending: Surgery | Admitting: Surgery

## 2022-05-21 ENCOUNTER — Encounter (HOSPITAL_COMMUNITY): Payer: Self-pay | Admitting: Emergency Medicine

## 2022-05-21 DIAGNOSIS — I4891 Unspecified atrial fibrillation: Secondary | ICD-10-CM | POA: Diagnosis not present

## 2022-05-21 DIAGNOSIS — Z7901 Long term (current) use of anticoagulants: Secondary | ICD-10-CM | POA: Insufficient documentation

## 2022-05-21 DIAGNOSIS — T1490XA Injury, unspecified, initial encounter: Secondary | ICD-10-CM | POA: Diagnosis not present

## 2022-05-21 DIAGNOSIS — Z8546 Personal history of malignant neoplasm of prostate: Secondary | ICD-10-CM | POA: Insufficient documentation

## 2022-05-21 DIAGNOSIS — J939 Pneumothorax, unspecified: Secondary | ICD-10-CM | POA: Diagnosis not present

## 2022-05-21 DIAGNOSIS — Z8673 Personal history of transient ischemic attack (TIA), and cerebral infarction without residual deficits: Secondary | ICD-10-CM | POA: Diagnosis not present

## 2022-05-21 DIAGNOSIS — W19XXXA Unspecified fall, initial encounter: Secondary | ICD-10-CM | POA: Diagnosis not present

## 2022-05-21 DIAGNOSIS — I10 Essential (primary) hypertension: Secondary | ICD-10-CM | POA: Insufficient documentation

## 2022-05-21 DIAGNOSIS — S299XXA Unspecified injury of thorax, initial encounter: Secondary | ICD-10-CM | POA: Diagnosis not present

## 2022-05-21 DIAGNOSIS — I63532 Cerebral infarction due to unspecified occlusion or stenosis of left posterior cerebral artery: Secondary | ICD-10-CM | POA: Diagnosis not present

## 2022-05-21 DIAGNOSIS — W1839XA Other fall on same level, initial encounter: Secondary | ICD-10-CM | POA: Diagnosis not present

## 2022-05-21 DIAGNOSIS — Z85828 Personal history of other malignant neoplasm of skin: Secondary | ICD-10-CM | POA: Diagnosis not present

## 2022-05-21 DIAGNOSIS — R42 Dizziness and giddiness: Secondary | ICD-10-CM | POA: Diagnosis not present

## 2022-05-21 DIAGNOSIS — Z87891 Personal history of nicotine dependence: Secondary | ICD-10-CM | POA: Diagnosis not present

## 2022-05-21 DIAGNOSIS — R1111 Vomiting without nausea: Secondary | ICD-10-CM | POA: Diagnosis not present

## 2022-05-21 DIAGNOSIS — S270XXA Traumatic pneumothorax, initial encounter: Secondary | ICD-10-CM | POA: Diagnosis not present

## 2022-05-21 DIAGNOSIS — S272XXA Traumatic hemopneumothorax, initial encounter: Secondary | ICD-10-CM | POA: Diagnosis not present

## 2022-05-21 DIAGNOSIS — S0990XA Unspecified injury of head, initial encounter: Secondary | ICD-10-CM | POA: Diagnosis not present

## 2022-05-21 DIAGNOSIS — R110 Nausea: Secondary | ICD-10-CM | POA: Diagnosis not present

## 2022-05-21 DIAGNOSIS — Z96651 Presence of right artificial knee joint: Secondary | ICD-10-CM | POA: Diagnosis not present

## 2022-05-21 LAB — BASIC METABOLIC PANEL
Anion gap: 11 (ref 5–15)
BUN: 28 mg/dL — ABNORMAL HIGH (ref 8–23)
CO2: 23 mmol/L (ref 22–32)
Calcium: 9.6 mg/dL (ref 8.9–10.3)
Chloride: 109 mmol/L (ref 98–111)
Creatinine, Ser: 1.04 mg/dL (ref 0.61–1.24)
GFR, Estimated: >60 mL/min (ref >60)
Glucose, Bld: 107 mg/dL — ABNORMAL HIGH (ref 70–99)
Potassium: 3.8 mmol/L (ref 3.5–5.1)
Sodium: 143 mmol/L (ref 135–145)

## 2022-05-21 LAB — CBC WITH DIFFERENTIAL/PLATELET
Abs Immature Granulocytes: 0.03 10*3/uL (ref 0.00–0.07)
Basophils Absolute: 0.1 10*3/uL (ref 0.0–0.1)
Basophils Relative: 1 %
Eosinophils Absolute: 0.2 10*3/uL (ref 0.0–0.5)
Eosinophils Relative: 2 %
HCT: 40.5 % (ref 39.0–52.0)
Hemoglobin: 13.2 g/dL (ref 13.0–17.0)
Immature Granulocytes: 0 %
Lymphocytes Relative: 34 %
Lymphs Abs: 2.6 10*3/uL (ref 0.7–4.0)
MCH: 31.6 pg (ref 26.0–34.0)
MCHC: 32.6 g/dL (ref 30.0–36.0)
MCV: 96.9 fL (ref 80.0–100.0)
Monocytes Absolute: 0.6 10*3/uL (ref 0.1–1.0)
Monocytes Relative: 8 %
Neutro Abs: 4.3 10*3/uL (ref 1.7–7.7)
Neutrophils Relative %: 55 %
Platelets: 194 10*3/uL (ref 150–400)
RBC: 4.18 MIL/uL — ABNORMAL LOW (ref 4.22–5.81)
RDW: 13.6 % (ref 11.5–15.5)
WBC: 7.7 10*3/uL (ref 4.0–10.5)
nRBC: 0 % (ref 0.0–0.2)

## 2022-05-21 MED ORDER — ENOXAPARIN SODIUM 30 MG/0.3ML IJ SOSY
30.0000 mg | PREFILLED_SYRINGE | Freq: Two times a day (BID) | INTRAMUSCULAR | Status: DC
  Administered 2022-05-22 (×2): 30 mg via SUBCUTANEOUS
  Filled 2022-05-21 (×2): qty 0.3

## 2022-05-21 MED ORDER — OXYCODONE HCL 5 MG PO TABS: 5.0000 mg | ORAL_TABLET | ORAL | Status: DC | PRN

## 2022-05-21 MED ORDER — METOPROLOL TARTRATE 5 MG/5ML IV SOLN: 5.0000 mg | Freq: Four times a day (QID) | INTRAVENOUS | Status: DC | PRN

## 2022-05-21 MED ORDER — ACETAMINOPHEN 325 MG PO TABS
650.0000 mg | ORAL_TABLET | ORAL | Status: DC | PRN
  Administered 2022-05-22 – 2022-05-23 (×4): 650 mg via ORAL
  Filled 2022-05-21 (×4): qty 2

## 2022-05-21 MED ORDER — ONDANSETRON HCL 4 MG/2ML IJ SOLN: 4.0000 mg | Freq: Four times a day (QID) | INTRAMUSCULAR | Status: DC | PRN

## 2022-05-21 MED ORDER — MORPHINE SULFATE (PF) 2 MG/ML IV SOLN: 2.0000 mg | INTRAVENOUS | Status: DC | PRN

## 2022-05-21 MED ORDER — ONDANSETRON 4 MG PO TBDP: 4.0000 mg | ORAL_TABLET | Freq: Four times a day (QID) | ORAL | Status: DC | PRN

## 2022-05-21 NOTE — ED Provider Notes (Signed)
[facility] EMERGENCY DEPARTMENT Provider Note   CSN: [id number] Arrival date & time: 05/21/22  1848     History  Chief Complaint  Patient presents with   Level 2 fall on thinners    Justin Potter is a 88 y.o. male.  HPI 88 year old male with a history of A-fib on Eliquis as well as hypertension presents with a fall.  He was a level 2 trauma due to a fall while on Eliquis.  He was trying to sit in his chair to watch football and he misjudged and fell backwards.  He struck his head on the ground.  Never lost consciousness.  Denies any preceding illness besides dealing with the intramuscular hematoma from several months ago.  However no new or worsening symptoms.  Denies feeling chest pain, palpitations, focal weakness.  He states his neck is starting to feel little sore like he got some whiplash.  He has an abrasion to his hand but no pain there.  EMS reports normal vital signs.  Home Medications Prior to Admission medications   Medication Sig Start Date End Date Taking? Authorizing Provider  acetaminophen (TYLENOL) 325 MG tablet Take 2 tablets (650 mg total) by mouth every 6 (six) hours as needed for mild pain (or Fever >/= 101). 03/23/22   Angiulli, Lavon Paganini, PA-C  doxycycline (VIBRA-TABS) 100 MG tablet Take 1 tablet (100 mg total) by mouth 2 (two) times daily. 05/10/22   Regal, Tamala Fothergill, DPM  ELIQUIS 5 MG TABS tablet Take 1 tablet (5 mg total) by mouth 2 (two) times daily. 03/23/22   Angiulli, Lavon Paganini, PA-C  Multiple Vitamins-Minerals (OCUVITE PRESERVISION PO) Take 1 tablet by mouth every morning.     [provider]  Multiple Vitamins-Minerals (PRESERVISION AREDS 2+MULTI VIT PO) Take by mouth.    [provider]  Pediatric Multiple Vit-C-FA (ANIMAL CHEWABLE MULTIVITAMIN PO) Take 1 tablet by mouth daily.    [provider]      Allergies    Patient has no known allergies.    Review of Systems   Review of Systems  Constitutional:   Negative for fever.  Eyes:  Positive for visual disturbance (chronic, macular degeneration).  Cardiovascular:  Negative for chest pain and palpitations.  Gastrointestinal:  Negative for abdominal pain.  Musculoskeletal:  Positive for neck pain.  Neurological:  Negative for dizziness, syncope, weakness, light-headedness, numbness and headaches.    Physical Exam Updated Vital Signs BP (!) 152/99   Pulse 61   Temp 97.8 F (36.6 C) (Oral)   Resp 19   Ht '6\' 2"'$  (1.88 m)   Wt 63.5 kg   SpO2 99%   BMI 17.97 kg/m  Physical Exam Vitals and nursing note reviewed.  Constitutional:      Appearance: He is well-developed.  HENT:     Head: Normocephalic.     Comments: Perhaps mild occipital hematoma Eyes:     Extraocular Movements: Extraocular movements intact.     Pupils: Pupils are equal, round, and reactive to light.  Cardiovascular:     Rate and Rhythm: Normal rate and regular rhythm.     Heart sounds: Normal heart sounds.  Pulmonary:     Effort: Pulmonary effort is normal.     Breath sounds: Normal breath sounds.  Abdominal:     General: There is no distension.     Palpations: Abdomen is soft.     Tenderness: There is no abdominal tenderness.  Musculoskeletal:     Cervical back: No  rigidity or tenderness.     Comments: Small abrasion over 2nd MCP on right. No significant tenderness or swelling  Skin:    General: Skin is warm and dry.  Neurological:     Mental Status: He is alert.     Comments: CN 3-12 grossly intact. Symmetric strength in all 4 extremities. Grossly normal sensation.      ED Results / Procedures / Treatments   Labs (all labs ordered are listed, but only abnormal results are displayed) Labs Reviewed  BASIC METABOLIC PANEL - Abnormal; Notable for the following components:      Result Value   Glucose, Bld 107 (*)    BUN 28 (*)    All other components within normal limits  CBC WITH DIFFERENTIAL/PLATELET - Abnormal; Notable for the following components:    RBC 4.18 (*)    All other components within normal limits  CBC  CREATININE, SERUM    EKG EKG Interpretation  Date/Time:  Sunday May 21 2022 18:54:09 EDT Ventricular Rate:  69 PR Interval:    QRS Duration: 106 QT Interval:  411 QTC Calculation: 441 R Axis:   -68 Text Interpretation: Atrial fibrillation Left anterior fascicular block Low voltage, extremity leads Probable anterolateral infarct, old Confirmed by Sherwood Gambler [phone]) on 05/21/2022 7:05:49 PM  Radiology CT Chest Wo Contrast  Result Date: 05/21/2022 CLINICAL DATA:  Chest trauma, blunt.  Fall. EXAM: CT CHEST WITHOUT CONTRAST TECHNIQUE: Multidetector CT imaging of the chest was performed following the standard protocol without IV contrast. RADIATION DOSE REDUCTION: This exam was performed according to the departmental dose-optimization program which includes automated exposure control, adjustment of the mA and/or kV according to patient size and/or use of iterative reconstruction technique. COMPARISON:  02/10/2014 FINDINGS: Cardiovascular: Cardiomegaly. Scattered coronary artery and aortic calcifications. No aortic aneurysm. Mediastinum/Nodes: No mediastinal, hilar, or axillary adenopathy. Proximal thoracic esophagus is dilated with air. Trachea and thyroid unremarkable. Lungs/Pleura: Tiny right apical pneumothorax. No confluent airspace opacities or effusions bilaterally. 8 mm left lower lobe pulmonary nodule on image 143. 2 mm left lower lobe pulmonary nodule on image 110. These are stable since prior study compatible with benign nodules. Upper Abdomen: No acute findings Musculoskeletal: No visible rib fracture. No acute bony abnormality. IMPRESSION: Tiny right apical pneumothorax.  No visible rib fracture. Cardiomegaly, coronary artery disease. Aortic Atherosclerosis (ICD10-I70.0). Electronically Signed   By: Rolm Baptise M.D.   On: 05/21/2022 21:26   DG Chest Portable 1 View  Result Date: 05/21/2022 CLINICAL DATA:   Pneumothorax. EXAM: PORTABLE CHEST 1 VIEW COMPARISON:  Chest radiograph dated 11/02/2020. Cervical spine CT dated 05/21/2022. FINDINGS: There is a small right upper lobe/apical pneumothorax as seen on the prior CT measuring approximately 3 mm in thickness. No focal consolidation, pleural effusion. Borderline cardiomegaly. No acute osseous pathology. IMPRESSION: Small right apical pneumothorax. Electronically Signed   By: Anner Crete M.D.   On: 05/21/2022 20:53   CT Head Wo Contrast  Result Date: 05/21/2022 CLINICAL DATA:  Trauma. EXAM: CT HEAD WITHOUT CONTRAST CT CERVICAL SPINE WITHOUT CONTRAST TECHNIQUE: Multidetector CT imaging of the head and cervical spine was performed following the standard protocol without intravenous contrast. Multiplanar CT image reconstructions of the cervical spine were also generated. RADIATION DOSE REDUCTION: This exam was performed according to the departmental dose-optimization program which includes automated exposure control, adjustment of the mA and/or kV according to patient size and/or use of iterative reconstruction technique. COMPARISON:  CT head CT dated 10/25/2020. FINDINGS: CT HEAD FINDINGS Brain: Mild age-related atrophy  and chronic microvascular ischemic changes. Old left occipital infarct. There is no acute intracranial hemorrhage. No mass effect or midline shift. No extra-axial fluid collection. Vascular: No hyperdense vessel or unexpected calcification. Skull: Normal. Negative for fracture or focal lesion. Sinuses/Orbits: No acute finding. Other: None CT CERVICAL SPINE FINDINGS Alignment: No acute subluxation. Skull base and vertebrae: No acute fracture.  Osteopenia. Soft tissues and spinal canal: No prevertebral fluid or swelling. No visible canal hematoma. Disc levels:  No acute findings.  Multilevel degenerative changes. Upper chest: Biapical subpleural scarring and postsurgical changes of the right lung apex. Minimal right apical pneumothorax measuring up  to 3 mm in thickness. Other: Bilateral carotid bulb calcified plaques. IMPRESSION: 1. No acute intracranial pathology. Mild age-related atrophy and chronic microvascular ischemic changes. Old left occipital infarct. 2. No acute/traumatic cervical spine pathology. 3. Minimal scratch and right apical pneumothorax. These results were called by telephone at the time of interpretation on 05/21/2022 at 8:20 pm to provider Sherwood Gambler , who verbally acknowledged these results. Electronically Signed   By: Anner Crete M.D.   On: 05/21/2022 20:30   CT Cervical Spine Wo Contrast  Result Date: 05/21/2022 CLINICAL DATA:  Trauma. EXAM: CT HEAD WITHOUT CONTRAST CT CERVICAL SPINE WITHOUT CONTRAST TECHNIQUE: Multidetector CT imaging of the head and cervical spine was performed following the standard protocol without intravenous contrast. Multiplanar CT image reconstructions of the cervical spine were also generated. RADIATION DOSE REDUCTION: This exam was performed according to the departmental dose-optimization program which includes automated exposure control, adjustment of the mA and/or kV according to patient size and/or use of iterative reconstruction technique. COMPARISON:  CT head CT dated 10/25/2020. FINDINGS: CT HEAD FINDINGS Brain: Mild age-related atrophy and chronic microvascular ischemic changes. Old left occipital infarct. There is no acute intracranial hemorrhage. No mass effect or midline shift. No extra-axial fluid collection. Vascular: No hyperdense vessel or unexpected calcification. Skull: Normal. Negative for fracture or focal lesion. Sinuses/Orbits: No acute finding. Other: None CT CERVICAL SPINE FINDINGS Alignment: No acute subluxation. Skull base and vertebrae: No acute fracture.  Osteopenia. Soft tissues and spinal canal: No prevertebral fluid or swelling. No visible canal hematoma. Disc levels:  No acute findings.  Multilevel degenerative changes. Upper chest: Biapical subpleural scarring and  postsurgical changes of the right lung apex. Minimal right apical pneumothorax measuring up to 3 mm in thickness. Other: Bilateral carotid bulb calcified plaques. IMPRESSION: 1. No acute intracranial pathology. Mild age-related atrophy and chronic microvascular ischemic changes. Old left occipital infarct. 2. No acute/traumatic cervical spine pathology. 3. Minimal scratch and right apical pneumothorax. These results were called by telephone at the time of interpretation on 05/21/2022 at 8:20 pm to provider Sherwood Gambler , who verbally acknowledged these results. Electronically Signed   By: Anner Crete M.D.   On: 05/21/2022 20:30    Procedures Procedures    Medications Ordered in ED Medications  acetaminophen (TYLENOL) tablet 650 mg (has no administration in time range)  oxyCODONE (Oxy IR/ROXICODONE) immediate release tablet 5 mg (has no administration in time range)  morphine (PF) 2 MG/ML injection 2-4 mg (has no administration in time range)  enoxaparin (LOVENOX) injection 30 mg (has no administration in time range)  ondansetron (ZOFRAN-ODT) disintegrating tablet 4 mg (has no administration in time range)    Or  ondansetron (ZOFRAN) injection 4 mg (has no administration in time range)  metoprolol tartrate (LOPRESSOR) injection 5 mg (has no administration in time range)    ED Course/ Medical Decision Making/ A&P  Medical Decision Making Amount and/or Complexity of Data Reviewed Labs: ordered.    Details: Labs are unremarkable including no AKI or significant anemia. Radiology: ordered.    Details: CT head and C-spine without fracture or head bleed.  However there is a small pneumothorax.  Discussed this with radiology. ECG/medicine tests: independent interpretation performed.    Details: A-fib without ischemia.  Risk Decision regarding hospitalization.   From a head injury perspective, no head bleed.  He is not altered from his baseline.  I discussed  with the son as well.  However on the CT cervical spine he is found to have a small right-sided pneumothorax.  No rib fractures obviously seen.  CT chest obtained after discussion with radiology and there is still a small pneumothorax but no other obvious rib fractures or chest trauma.  I discussed with trauma surgery on-call, Dr. Kieth Brightly, and decision was made to observe patient overnight with repeat x-ray in the morning.  He is not hypoxic or having any respiratory symptoms.        Final Clinical Impression(s) / ED Diagnoses Final diagnoses:  Fall, initial encounter  Pneumothorax on right    Rx / DC Orders ED Discharge Orders     None         Sherwood Gambler, MD 05/21/22 2249

## 2022-05-21 NOTE — Progress Notes (Signed)
   05/21/22 1840  Clinical Encounter Type  Visited With Patient not available  Visit Type Initial;Trauma  Referral From Nurse  Consult/Referral To Chaplain   Chaplain responded to a level two trauma. Patient was under the care of the medical team. No family is present.  If a chaplain is requested someone will respond.   Danice Goltz [facility]  [phone]

## 2022-05-21 NOTE — ED Triage Notes (Signed)
Pt BIB [facility]. Pt is from [facility]  (Independent living). After mechanical fall impact to posterior head. Pt is on Eliquis.

## 2022-05-21 NOTE — ED Notes (Signed)
Dr. Regenia Skeeter at bedside conversing w/pt and pt's family.

## 2022-05-21 NOTE — Progress Notes (Signed)
Orthopedic Tech Progress Note Patient Details:  DEMICHAEL TRAUM 10/12/1925 [id number]  Level 2 trauma. Not needed right now. Patient ID: TAHA DIMOND, male   DOB: 02/18/1926, 88 y.o.   MRN: [id number]  Carin Primrose 05/21/2022, 7:07 PM

## 2022-05-21 NOTE — ED Notes (Signed)
[place] pts son/Caregiver wants a call for any updates

## 2022-05-21 NOTE — H&P (Signed)
Justin Potter is an 88 y.o. male.  HPI: 88 yo male was tryin got sit and lost balance and fell. He did not lose consciousness. He has no pain. He has no dificulty breathing.  Past Medical History:  Diagnosis Date   Anxiety    Arthritis    "in the fingers" per pt   Atrial fibrillation ([place])    Bladder neck contracture    Dysrhythmia    A-FIB / HX BRADYCARDIA - FOLLOWED BY DR. Loup DUE TO [name]   Gross hematuria    History of gout    History of prostate cancer    S/P RADIOACTIVE SEED IMPLANTS   History of squamous cell carcinoma of skin    EXCISION LOWER LIP AND RIGHT EAR   History of TIA (transient ischemic attack)    probable tia no residual  09-09-2012   Hyperlipidemia    Hypertension    Macular degeneration    both eyes   Urethral stricture    Urinary retention     Past Surgical History:  Procedure Laterality Date   APPENDECTOMY     CATARACT EXTRACTION W/ INTRAOCULAR LENS  IMPLANT, BILATERAL     CHOLECYSTECTOMY  1977   CYSTOSCOPY WITH URETHRAL DILATATION N/A 02/12/2013   Procedure: CYSTOSCOPY WITH BALLOON DILATATION OF URETHRAL STRICTURE AND BLADDER FULGERATION;  Surgeon: Bernestine Amass, MD;  Location: [facility];  Service: Urology;  Laterality: N/A;   ESOPHAGOGASTRODUODENOSCOPY (EGD) WITH PROPOFOL N/A 11/02/2020   Procedure: ESOPHAGOGASTRODUODENOSCOPY (EGD) WITH PROPOFOL;  Surgeon: Clarene Essex, MD;  Location: [facility];  Service: Endoscopy;  Laterality: N/A;   LAPAROSCOPIC APPENDECTOMY N/A 08/22/2013   Procedure: APPENDECTOMY LAPAROSCOPIC;  Surgeon: Pedro Earls, MD;  Location: [facility];  Service: General;  Laterality: N/A;   [place] N/A 11/02/2020   Procedure: SAVORY DILATION;  Surgeon: Clarene Essex, MD;  Location: [facility];  Service: Endoscopy;  Laterality: N/A;   TONSILLECTOMY  as child   TOTAL KNEE ARTHROPLASTY Right 11-23-2004    Family History  Problem Relation Age of Onset    Cancer Mother    Cancer Father     Social History:  reports that he quit smoking about 43 years ago. His smoking use included cigarettes. He has a 52.50 pack-year smoking history. He has never used smokeless tobacco. He reports current alcohol use. He reports that he does not use drugs.  Allergies: No Known Allergies  Medications: I have reviewed the patient's current medications.  Results for orders placed or performed during the hospital encounter of 05/21/22 (from the past 48 hour(s))  Basic metabolic panel     Status: Abnormal   Collection Time: 05/21/22  6:59 PM  Result Value Ref Range   Sodium 143 135 - 145 mmol/L   Potassium 3.8 3.5 - 5.1 mmol/L   Chloride 109 98 - 111 mmol/L   CO2 23 22 - 32 mmol/L   Glucose, Bld 107 (H) 70 - 99 mg/dL    Comment: Glucose reference range applies only to samples taken after fasting for at least 8 hours.   BUN 28 (H) 8 - 23 mg/dL   Creatinine, Ser 1.04 0.61 - 1.24 mg/dL   Calcium 9.6 8.9 - 10.3 mg/dL   GFR, Estimated >60 >60 mL/min    Comment: (NOTE) Calculated using the CKD-EPI Creatinine Equation (2021)    Anion gap 11 5 - 15    Comment: Performed at [facility]  Lab, [address], [place], [place] [address]  CBC with Differential     Status: Abnormal   Collection Time: 05/21/22  6:59 PM  Result Value Ref Range   WBC 7.7 4.0 - 10.5 K/uL   RBC 4.18 (L) 4.22 - 5.81 MIL/uL   Hemoglobin 13.2 13.0 - 17.0 g/dL   HCT 40.5 39.0 - 52.0 %   MCV 96.9 80.0 - 100.0 fL   MCH 31.6 26.0 - 34.0 pg   MCHC 32.6 30.0 - 36.0 g/dL   RDW 13.6 11.5 - 15.5 %   Platelets 194 150 - 400 K/uL   nRBC 0.0 0.0 - 0.2 %   Neutrophils Relative % 55 %   Neutro Abs 4.3 1.7 - 7.7 K/uL   Lymphocytes Relative 34 %   Lymphs Abs 2.6 0.7 - 4.0 K/uL   Monocytes Relative 8 %   Monocytes Absolute 0.6 0.1 - 1.0 K/uL   Eosinophils Relative 2 %   Eosinophils Absolute 0.2 0.0 - 0.5 K/uL   Basophils Relative 1 %   Basophils Absolute 0.1 0.0 - 0.1 K/uL   Immature  Granulocytes 0 %   Abs Immature Granulocytes 0.03 0.00 - 0.07 K/uL    Comment: Performed at [facility] [address], [place], [place] [address]    CT Chest Wo Contrast  Result Date: 05/21/2022 CLINICAL DATA:  Chest trauma, blunt.  Fall. EXAM: CT CHEST WITHOUT CONTRAST TECHNIQUE: Multidetector CT imaging of the chest was performed following the standard protocol without IV contrast. RADIATION DOSE REDUCTION: This exam was performed according to the departmental dose-optimization program which includes automated exposure control, adjustment of the mA and/or kV according to patient size and/or use of iterative reconstruction technique. COMPARISON:  02/10/2014 FINDINGS: Cardiovascular: Cardiomegaly. Scattered coronary artery and aortic calcifications. No aortic aneurysm. Mediastinum/Nodes: No mediastinal, hilar, or axillary adenopathy. Proximal thoracic esophagus is dilated with air. Trachea and thyroid unremarkable. Lungs/Pleura: Tiny right apical pneumothorax. No confluent airspace opacities or effusions bilaterally. 8 mm left lower lobe pulmonary nodule on image 143. 2 mm left lower lobe pulmonary nodule on image 110. These are stable since prior study compatible with benign nodules. Upper Abdomen: No acute findings Musculoskeletal: No visible rib fracture. No acute bony abnormality. IMPRESSION: Tiny right apical pneumothorax.  No visible rib fracture. Cardiomegaly, coronary artery disease. Aortic Atherosclerosis (ICD10-I70.0). Electronically Signed   By: Rolm Baptise M.D.   On: 05/21/2022 21:26   DG Chest Portable 1 View  Result Date: 05/21/2022 CLINICAL DATA:  Pneumothorax. EXAM: PORTABLE CHEST 1 VIEW COMPARISON:  Chest radiograph dated 11/02/2020. Cervical spine CT dated 05/21/2022. FINDINGS: There is a small right upper lobe/apical pneumothorax as seen on the prior CT measuring approximately 3 mm in thickness. No focal consolidation, pleural effusion. Borderline cardiomegaly. No acute  osseous pathology. IMPRESSION: Small right apical pneumothorax. Electronically Signed   By: Anner Crete M.D.   On: 05/21/2022 20:53   CT Head Wo Contrast  Result Date: 05/21/2022 CLINICAL DATA:  Trauma. EXAM: CT HEAD WITHOUT CONTRAST CT CERVICAL SPINE WITHOUT CONTRAST TECHNIQUE: Multidetector CT imaging of the head and cervical spine was performed following the standard protocol without intravenous contrast. Multiplanar CT image reconstructions of the cervical spine were also generated. RADIATION DOSE REDUCTION: This exam was performed according to the departmental dose-optimization program which includes automated exposure control, adjustment of the mA and/or kV according to patient size and/or use of iterative reconstruction technique. COMPARISON:  CT head CT dated 10/25/2020. FINDINGS: CT HEAD FINDINGS Brain: Mild  age-related atrophy and chronic microvascular ischemic changes. Old left occipital infarct. There is no acute intracranial hemorrhage. No mass effect or midline shift. No extra-axial fluid collection. Vascular: No hyperdense vessel or unexpected calcification. Skull: Normal. Negative for fracture or focal lesion. Sinuses/Orbits: No acute finding. Other: None CT CERVICAL SPINE FINDINGS Alignment: No acute subluxation. Skull base and vertebrae: No acute fracture.  Osteopenia. Soft tissues and spinal canal: No prevertebral fluid or swelling. No visible canal hematoma. Disc levels:  No acute findings.  Multilevel degenerative changes. Upper chest: Biapical subpleural scarring and postsurgical changes of the right lung apex. Minimal right apical pneumothorax measuring up to 3 mm in thickness. Other: Bilateral carotid bulb calcified plaques. IMPRESSION: 1. No acute intracranial pathology. Mild age-related atrophy and chronic microvascular ischemic changes. Old left occipital infarct. 2. No acute/traumatic cervical spine pathology. 3. Minimal scratch and right apical pneumothorax. These results were  called by telephone at the time of interpretation on 05/21/2022 at 8:20 pm to provider Sherwood Gambler , who verbally acknowledged these results. Electronically Signed   By: Anner Crete M.D.   On: 05/21/2022 20:30   CT Cervical Spine Wo Contrast  Result Date: 05/21/2022 CLINICAL DATA:  Trauma. EXAM: CT HEAD WITHOUT CONTRAST CT CERVICAL SPINE WITHOUT CONTRAST TECHNIQUE: Multidetector CT imaging of the head and cervical spine was performed following the standard protocol without intravenous contrast. Multiplanar CT image reconstructions of the cervical spine were also generated. RADIATION DOSE REDUCTION: This exam was performed according to the departmental dose-optimization program which includes automated exposure control, adjustment of the mA and/or kV according to patient size and/or use of iterative reconstruction technique. COMPARISON:  CT head CT dated 10/25/2020. FINDINGS: CT HEAD FINDINGS Brain: Mild age-related atrophy and chronic microvascular ischemic changes. Old left occipital infarct. There is no acute intracranial hemorrhage. No mass effect or midline shift. No extra-axial fluid collection. Vascular: No hyperdense vessel or unexpected calcification. Skull: Normal. Negative for fracture or focal lesion. Sinuses/Orbits: No acute finding. Other: None CT CERVICAL SPINE FINDINGS Alignment: No acute subluxation. Skull base and vertebrae: No acute fracture.  Osteopenia. Soft tissues and spinal canal: No prevertebral fluid or swelling. No visible canal hematoma. Disc levels:  No acute findings.  Multilevel degenerative changes. Upper chest: Biapical subpleural scarring and postsurgical changes of the right lung apex. Minimal right apical pneumothorax measuring up to 3 mm in thickness. Other: Bilateral carotid bulb calcified plaques. IMPRESSION: 1. No acute intracranial pathology. Mild age-related atrophy and chronic microvascular ischemic changes. Old left occipital infarct. 2. No acute/traumatic  cervical spine pathology. 3. Minimal scratch and right apical pneumothorax. These results were called by telephone at the time of interpretation on 05/21/2022 at 8:20 pm to provider Sherwood Gambler , who verbally acknowledged these results. Electronically Signed   By: Anner Crete M.D.   On: 05/21/2022 20:30    Review of Systems  Constitutional: Negative.   HENT: Negative.    Eyes: Negative.   Respiratory: Negative.    Cardiovascular: Negative.   Gastrointestinal: Negative.   Genitourinary: Negative.   Musculoskeletal: Negative.   Skin: Negative.   Neurological: Negative.   Endo/Heme/Allergies: Negative.   Psychiatric/Behavioral: Negative.      PE Blood pressure (!) 152/99, pulse 61, temperature 97.8 F (36.6 C), temperature source Oral, resp. rate 19, height '6\' 2"'$  (1.88 m), weight 63.5 kg, SpO2 99 %. Constitutional: NAD; conversant; no deformities Eyes: Moist conjunctiva; no lid lag; anicteric; PERRL Neck: Trachea midline; no thyromegaly, no cervicalgia Lungs: Normal respiratory effort; no tactile fremitus  CV: RRR; no palpable thrills; no pitting edema GI: Abd nontender; no palpable hepatosplenomegaly MSK: nable to assess gait; no clubbing/cyanosis Psychiatric: Appropriate affect; alert and oriented x3 Lymphatic: No palpable cervical or axillary lymphadenopathy   Assessment/Plan: 88 yo male fall from standing height  Occult pneumothorax - observe overnight, repeat XR in am  I reviewed last 24 h vitals and pain scores, last 48 h intake and output, last 24 h labs and trends, and last 24 h imaging results.  This care required high  level of medical decision making.   Arta Bruce Eulamae Greenstein 05/21/2022, 10:13 PM

## 2022-05-21 NOTE — ED Notes (Signed)
Pt to CT scan.

## 2022-05-22 ENCOUNTER — Observation Stay (HOSPITAL_COMMUNITY): Payer: Medicare Other

## 2022-05-22 DIAGNOSIS — J939 Pneumothorax, unspecified: Secondary | ICD-10-CM | POA: Diagnosis not present

## 2022-05-22 DIAGNOSIS — S272XXA Traumatic hemopneumothorax, initial encounter: Secondary | ICD-10-CM | POA: Diagnosis not present

## 2022-05-22 DIAGNOSIS — S0990XA Unspecified injury of head, initial encounter: Secondary | ICD-10-CM | POA: Diagnosis not present

## 2022-05-22 LAB — CREATININE, SERUM
Creatinine, Ser: 0.98 mg/dL (ref 0.61–1.24)
GFR, Estimated: >60 mL/min (ref >60)

## 2022-05-22 LAB — CBC
HCT: 40.8 % (ref 39.0–52.0)
Hemoglobin: 12.8 g/dL — ABNORMAL LOW (ref 13.0–17.0)
MCH: 30.6 pg (ref 26.0–34.0)
MCHC: 31.4 g/dL (ref 30.0–36.0)
MCV: 97.6 fL (ref 80.0–100.0)
Platelets: 190 10*3/uL (ref 150–400)
RBC: 4.18 MIL/uL — ABNORMAL LOW (ref 4.22–5.81)
RDW: 13.9 % (ref 11.5–15.5)
WBC: 12.7 10*3/uL — ABNORMAL HIGH (ref 4.0–10.5)
nRBC: 0 % (ref 0.0–0.2)

## 2022-05-22 NOTE — Plan of Care (Signed)

## 2022-05-22 NOTE — Progress Notes (Signed)
Received permission from the patient to speak with his son.  I called and spoke to the patient's son, Dr. Adela Lank.  We discussed his father's injuries and repeat chest x-ray results. We discussed how he did with therapies today and therapies recommendation for transition from independent living to assisted living.  It appears family is already working on transition patient from [place] to [place] at [facility].  Dr. Valere Dross is going to reach out to [facility] and see if they can help expedite this.  In the meantime the patient is reported to already be receiving therapies at [facility]. His son, Dr. Valere Dross reports they can also get an aide to help around the patients IL home in the interim till he is able to transition to AL. I have re-written for [facility] PT/OT so he can continue getting therapies at d/c.  We also discussed that patients Eliquis is currently held and he is on prophylactic Lovenox here.  We discussed with patients recurrent falls we have reached out to cardiology to discuss whether his Eliquis should be held at discharge or resumed. Patients son reports cardiology, patient and him have already discussed this as an outpatient.  Patient had a stroke roughly 1.5 years ago after having Eliquis held for a few days before a GI procedure.  We discussed plan for likely resumption of Eliquis in the morning after touching base with cardiology.  We discussed plan for repeat therapy session tomorrow before discharge. Time was allowed for questions to be answered. Family appears comfortable with above plan.   Jillyn Ledger, [facility]  05/22/22, 5:17 PM

## 2022-05-22 NOTE — Evaluation (Signed)
Physical Therapy Evaluation Patient Details Name: Justin Potter MRN: [id number] DOB: 09/25/1925 Today's Date: 05/22/2022  History of Present Illness  Patient isa  88 y/o male who presents on 10/29 as level 2 trauma due to fall at home and hitting his head. Found to have Rt sided PTX. Recent fall in August resulting in hematoma of gluteus maximus. PMH includes HTN, blindness, A-fib, prostate ca, TIA, hypotension.  Clinical Impression  Patient presents with generalized weakness/deconditioning, pain, impaired balance and impaired mobility s/p above. Pt is from [place] and reports using RW for ambulation PTA. Pt does his own ADLs and staff assists with IADLs and son manages medication. Pt walks a long distance to the dining hall and has been working with PT. No longer participating in exercise classes daily since recent fall 2 months ago. Today, pt tolerated transfers and ambulation with Min guard-Min A for balance/safety. Reports son and him have been talking about transitioning to ALF at [facility], which would be a great idea due to recent falls and needing more assist as of late. Communicated this to the trauma PA.Pt is not quite ready to d/c back to ILF facility. Will consult mobility tech to increase activity and walking as well. Will follow acutely to maximize independence and mobility prior to return home.       Recommendations for follow up therapy are one component of a multi-disciplinary discharge planning process, led by the attending physician.  Recommendations may be updated based on patient status, additional functional criteria and insurance authorization.  Follow Up Recommendations Home health PT (plans to transition to ALF)      Assistance Recommended at Discharge Intermittent Supervision/Assistance  Patient can return home with the following  A little help with walking and/or transfers;A little help with bathing/dressing/bathroom;Assistance with cooking/housework;Direct  supervision/assist for medications management    Equipment Recommendations None recommended by PT  Recommendations for Other Services       Functional Status Assessment Patient has had a recent decline in their functional status and demonstrates the ability to make significant improvements in function in a reasonable and predictable amount of time.     Precautions / Restrictions Precautions Precautions: Fall;Other (comment) Precaution Comments: 2 recent falls Restrictions Weight Bearing Restrictions: No      Mobility  Bed Mobility Overal bed mobility: Needs Assistance Bed Mobility: Rolling, Sidelying to Sit Rolling: Supervision Sidelying to sit: HOB elevated, Min assist       General bed mobility comments: Use of rail to get to EOB, increased time. Min A with trunk.    Transfers Overall transfer level: Needs assistance Equipment used: Rolling walker (2 wheels) Transfers: Sit to/from Stand Sit to Stand: Min guard, From elevated surface           General transfer comment: Min guard for safety. Stood from EOB x1, slow to rise. Transferred to chair post ambulation.    Ambulation/Gait Ambulation/Gait assistance: Min assist Gait Distance (Feet): 200 Feet Assistive device: Rolling walker (2 wheels) Gait Pattern/deviations: Step-through pattern, Decreased stride length, Trunk flexed Gait velocity: decreased Gait velocity interpretation: <1.31 ft/sec, indicative of household ambulator   General Gait Details: Slow, guarded gait with small step lengths bilaterally and flexed trunk, assist for RW management at times esp with turns and due to impaired vision. Heavy reliance on UEs for support.  Stairs            Wheelchair Mobility    Modified Rankin (Stroke Patients Only)       Balance Overall balance  assessment: Needs assistance, History of Falls Sitting-balance support: Feet supported, No upper extremity supported Sitting balance-Leahy Scale: Good      Standing balance support: During functional activity Standing balance-Leahy Scale: Poor Standing balance comment: Requires UE support in standing.                             Pertinent Vitals/Pain Pain Assessment Pain Assessment: Faces Faces Pain Scale: Hurts even more Pain Location: neck, RLE Pain Descriptors / Indicators: Sore, Discomfort, Aching Pain Intervention(s): Monitored during session, Limited activity within patient's tolerance, Repositioned    Home Living Family/patient expects to be discharged to:: Other (Comment)                   Additional Comments: [facility] independent living, has life alert button as well as pull cords in rooms. has 4 and 3 wheeled rollators    Prior Function Prior Level of Function : Independent/Modified Independent             Mobility Comments: ambulates independently with rollator, has been working with PT, has not been doing exercise classes since fall 2 months ago, used to go 5 days/week. ADLs Comments: independent before this injury. Staff assisting with IADLs and son managing medications     Hand Dominance   Dominant Hand: Right    Extremity/Trunk Assessment   Upper Extremity Assessment Upper Extremity Assessment: Defer to OT evaluation    Lower Extremity Assessment Lower Extremity Assessment: RLE deficits/detail RLE Deficits / Details: pain in right hip from recent fall RLE Sensation: WNL       Communication   Communication: No difficulties  Cognition Arousal/Alertness: Awake/alert Behavior During Therapy: WFL for tasks assessed/performed Overall Cognitive Status: Within Functional Limits for tasks assessed                                          General Comments General comments (skin integrity, edema, etc.): VSS on RA.    Exercises     Assessment/Plan    PT Assessment Patient needs continued PT services  PT Problem List Decreased strength;Decreased  mobility;Pain;Decreased balance       PT Treatment Interventions Therapeutic activities;Gait training;Therapeutic exercise;Balance training;Functional mobility training;Patient/family education    PT Goals (Current goals can be found in the Care Plan section)  Acute Rehab PT Goals Patient Stated Goal: to get stronger and return home PT Goal Formulation: With patient Time For Goal Achievement: 06/05/22 Potential to Achieve Goals: Good    Frequency Min 3X/week     Co-evaluation               AM-PAC PT "6 Clicks" Mobility  Outcome Measure Help needed turning from your back to your side while in a flat bed without using bedrails?: A Little Help needed moving from lying on your back to sitting on the side of a flat bed without using bedrails?: A Little Help needed moving to and from a bed to a chair (including a wheelchair)?: A Little Help needed standing up from a chair using your arms (e.g., wheelchair or bedside chair)?: A Little Help needed to walk in hospital room?: A Little Help needed climbing 3-5 steps with a railing? : A Little 6 Click Score: 18    End of Session Equipment Utilized During Treatment: Gait belt Activity Tolerance: Patient tolerated treatment well Patient left: in  chair;with call bell/phone within reach;with chair alarm set Nurse Communication: Mobility status PT Visit Diagnosis: Pain;Muscle weakness (generalized) (M62.81);Unsteadiness on feet (R26.81);Difficulty in walking, not elsewhere classified (R26.2) Pain - part of body:  (neck)    Time: 4621-9471 PT Time Calculation (min) (ACUTE ONLY): 30 min   Charges:   PT Evaluation $PT Eval Moderate Complexity: 1 Mod PT Treatments $Gait Training: 8-22 mins        Marisa Severin, PT, DPT Acute Rehabilitation Services Secure chat preferred Office [place] 05/22/2022, 1:53 PM

## 2022-05-22 NOTE — Progress Notes (Signed)
Transition of Care [facility]) - CAGE-AID Screening   Patient Details  Name: Justin Potter MRN: [id number] Date of Birth: 06/22/1926   Elvina Sidle, RN Trauma Response Nurse Phone Number: [phone] 05/22/2022, 4:10 PM    CAGE-AID Screening:    Have You Ever Felt You Ought to Cut Down on Your Drinking or Drug Use?: No Have People Annoyed You By Critizing Your Drinking Or Drug Use?: No Have You Felt Bad Or Guilty About Your Drinking Or Drug Use?: No Have You Ever Had a Drink or Used Drugs First Thing In The Morning to Steady Your Nerves or to Get Rid of a Hangover?: No CAGE-AID Score: 0  Substance Abuse Education Offered: No (Denies alcohol use)

## 2022-05-22 NOTE — Progress Notes (Addendum)
Subjective: CC: A&O x 3 (self, time, place). Doesn't remember why he is here or the fall yesterday. No pain. No sob. On RA. Reports no po intake since presentation, no appetitie. Denies abdominal pain. No n/v. Voided this am and had to get cleaned up per patient report (nothing on I/O).  Live at [place]. Fall a few weeks ago where he had a R hip hematoma. Admitted to CIR after 2 days in the hospital. Uses walker at baseline. Support from son who lives nearby. Hx prosCA, pers AF on Eliquis, hx bradycardia, hx TIA, HTN, HLD  Takes eliquis for A. Fib. Doesn't think he takes anything for BP which is c/w prior TRH notes.   Objective: Vital signs in last 24 hours: Temp:  [97.7 F (36.5 C)-98.3 F (36.8 C)] 97.7 F (36.5 C) (10/30 0732) Pulse Rate:  [49-72] 52 (10/30 0732) Resp:  [12-24] 16 (10/30 0621) BP: (125-159)/(68-102) 125/79 (10/30 0732) SpO2:  [97 %-100 %] 98 % (10/30 0732) Weight:  [61.2 kg-63.5 kg] 63.5 kg (10/29 1858)    Intake/Output from previous day: No intake/output data recorded. Intake/Output this shift: No intake/output data recorded.  PE: Gen:  Alert, NAD, pleasant Card:  Reg rate. HR 60's on monitor Pulm:  CTAB, no W/R/R, effort normal Abd: Soft, ND, NT  Ext: Patient with active ROM to BUEs without reported pain.  Able active ROM to LLE without reported pain and no ttp over major joints of the LLE.  Reports baseline decreased range of motion of right hip and feels this is at baseline.  No TTP over right hip.  No obvious hematoma over right gluteus that was noted in prior admission.  Reports history of prior right knee replacement.  There is no tenderness to palpation over this area or the remainder of the RLE.  Compression stockings to BLE's.  Neuro: non-focal. MAE's.  Psych: A&Ox3  Skin: no rashes noted, warm and dry  Lab Results:  Recent Labs    05/21/22 1859 05/22/22 0005  WBC 7.7 12.7*  HGB 13.2 12.8*  HCT 40.5 40.8  PLT 194 190    BMET Recent Labs    05/21/22 1859 05/22/22 0005  NA 143  --   K 3.8  --   CL 109  --   CO2 23  --   GLUCOSE 107*  --   BUN 28*  --   CREATININE 1.04 0.98  CALCIUM 9.6  --    PT/INR No results for input(s): "LABPROT", "INR" in the last 72 hours. CMP     Component Value Date/Time   NA 143 05/21/2022 1859   NA 140 08/13/2018 1035   K 3.8 05/21/2022 1859   CL 109 05/21/2022 1859   CO2 23 05/21/2022 1859   GLUCOSE 107 (H) 05/21/2022 1859   BUN 28 (H) 05/21/2022 1859   BUN 16 08/13/2018 1035   CREATININE 0.98 05/22/2022 0005   CALCIUM 9.6 05/21/2022 1859   PROT 5.4 (L) 03/15/2022 0941   ALBUMIN 2.8 (L) 03/15/2022 0941   AST 19 03/15/2022 0941   ALT 12 03/15/2022 0941   ALKPHOS 42 03/15/2022 0941   BILITOT 1.2 03/15/2022 0941   GFRNONAA >60 05/22/2022 0005   GFRAA 88 08/13/2018 1035   Lipase  No results found for: "LIPASE"  Studies/Results: DG Chest Port 1 View  Result Date: 05/22/2022 CLINICAL DATA:  88 year old male with trace right pneumothorax after fall. EXAM: PORTABLE CHEST 1 VIEW COMPARISON:  Chest CT 05/21/2022 and  earlier. FINDINGS: Portable AP semi upright views at 0458 hours. Trace if any right apical pneumothorax. Satisfactory lung volumes and ventilation. Stable cardiomegaly and mediastinal contours. No pleural effusion or pulmonary contusion identified. Stable visualized osseous structures. Negative visible bowel gas. IMPRESSION: 1. Trace if any residual right apical pneumothorax. 2. Cardiomegaly.  No other acute cardiopulmonary abnormality. Electronically Signed   By: Genevie Ann M.D.   On: 05/22/2022 05:47   CT Chest Wo Contrast  Result Date: 05/21/2022 CLINICAL DATA:  Chest trauma, blunt.  Fall. EXAM: CT CHEST WITHOUT CONTRAST TECHNIQUE: Multidetector CT imaging of the chest was performed following the standard protocol without IV contrast. RADIATION DOSE REDUCTION: This exam was performed according to the departmental dose-optimization program which  includes automated exposure control, adjustment of the mA and/or kV according to patient size and/or use of iterative reconstruction technique. COMPARISON:  02/10/2014 FINDINGS: Cardiovascular: Cardiomegaly. Scattered coronary artery and aortic calcifications. No aortic aneurysm. Mediastinum/Nodes: No mediastinal, hilar, or axillary adenopathy. Proximal thoracic esophagus is dilated with air. Trachea and thyroid unremarkable. Lungs/Pleura: Tiny right apical pneumothorax. No confluent airspace opacities or effusions bilaterally. 8 mm left lower lobe pulmonary nodule on image 143. 2 mm left lower lobe pulmonary nodule on image 110. These are stable since prior study compatible with benign nodules. Upper Abdomen: No acute findings Musculoskeletal: No visible rib fracture. No acute bony abnormality. IMPRESSION: Tiny right apical pneumothorax.  No visible rib fracture. Cardiomegaly, coronary artery disease. Aortic Atherosclerosis (ICD10-I70.0). Electronically Signed   By: Rolm Baptise M.D.   On: 05/21/2022 21:26   DG Chest Portable 1 View  Result Date: 05/21/2022 CLINICAL DATA:  Pneumothorax. EXAM: PORTABLE CHEST 1 VIEW COMPARISON:  Chest radiograph dated 11/02/2020. Cervical spine CT dated 05/21/2022. FINDINGS: There is a small right upper lobe/apical pneumothorax as seen on the prior CT measuring approximately 3 mm in thickness. No focal consolidation, pleural effusion. Borderline cardiomegaly. No acute osseous pathology. IMPRESSION: Small right apical pneumothorax. Electronically Signed   By: Anner Crete M.D.   On: 05/21/2022 20:53   CT Head Wo Contrast  Result Date: 05/21/2022 CLINICAL DATA:  Trauma. EXAM: CT HEAD WITHOUT CONTRAST CT CERVICAL SPINE WITHOUT CONTRAST TECHNIQUE: Multidetector CT imaging of the head and cervical spine was performed following the standard protocol without intravenous contrast. Multiplanar CT image reconstructions of the cervical spine were also generated. RADIATION DOSE  REDUCTION: This exam was performed according to the departmental dose-optimization program which includes automated exposure control, adjustment of the mA and/or kV according to patient size and/or use of iterative reconstruction technique. COMPARISON:  CT head CT dated 10/25/2020. FINDINGS: CT HEAD FINDINGS Brain: Mild age-related atrophy and chronic microvascular ischemic changes. Old left occipital infarct. There is no acute intracranial hemorrhage. No mass effect or midline shift. No extra-axial fluid collection. Vascular: No hyperdense vessel or unexpected calcification. Skull: Normal. Negative for fracture or focal lesion. Sinuses/Orbits: No acute finding. Other: None CT CERVICAL SPINE FINDINGS Alignment: No acute subluxation. Skull base and vertebrae: No acute fracture.  Osteopenia. Soft tissues and spinal canal: No prevertebral fluid or swelling. No visible canal hematoma. Disc levels:  No acute findings.  Multilevel degenerative changes. Upper chest: Biapical subpleural scarring and postsurgical changes of the right lung apex. Minimal right apical pneumothorax measuring up to 3 mm in thickness. Other: Bilateral carotid bulb calcified plaques. IMPRESSION: 1. No acute intracranial pathology. Mild age-related atrophy and chronic microvascular ischemic changes. Old left occipital infarct. 2. No acute/traumatic cervical spine pathology. 3. Minimal scratch and right apical pneumothorax.  These results were called by telephone at the time of interpretation on 05/21/2022 at 8:20 pm to provider Sherwood Gambler , who verbally acknowledged these results. Electronically Signed   By: Anner Crete M.D.   On: 05/21/2022 20:30   CT Cervical Spine Wo Contrast  Result Date: 05/21/2022 CLINICAL DATA:  Trauma. EXAM: CT HEAD WITHOUT CONTRAST CT CERVICAL SPINE WITHOUT CONTRAST TECHNIQUE: Multidetector CT imaging of the head and cervical spine was performed following the standard protocol without intravenous contrast.  Multiplanar CT image reconstructions of the cervical spine were also generated. RADIATION DOSE REDUCTION: This exam was performed according to the departmental dose-optimization program which includes automated exposure control, adjustment of the mA and/or kV according to patient size and/or use of iterative reconstruction technique. COMPARISON:  CT head CT dated 10/25/2020. FINDINGS: CT HEAD FINDINGS Brain: Mild age-related atrophy and chronic microvascular ischemic changes. Old left occipital infarct. There is no acute intracranial hemorrhage. No mass effect or midline shift. No extra-axial fluid collection. Vascular: No hyperdense vessel or unexpected calcification. Skull: Normal. Negative for fracture or focal lesion. Sinuses/Orbits: No acute finding. Other: None CT CERVICAL SPINE FINDINGS Alignment: No acute subluxation. Skull base and vertebrae: No acute fracture.  Osteopenia. Soft tissues and spinal canal: No prevertebral fluid or swelling. No visible canal hematoma. Disc levels:  No acute findings.  Multilevel degenerative changes. Upper chest: Biapical subpleural scarring and postsurgical changes of the right lung apex. Minimal right apical pneumothorax measuring up to 3 mm in thickness. Other: Bilateral carotid bulb calcified plaques. IMPRESSION: 1. No acute intracranial pathology. Mild age-related atrophy and chronic microvascular ischemic changes. Old left occipital infarct. 2. No acute/traumatic cervical spine pathology. 3. Minimal scratch and right apical pneumothorax. These results were called by telephone at the time of interpretation on 05/21/2022 at 8:20 pm to provider Sherwood Gambler , who verbally acknowledged these results. Electronically Signed   By: Anner Crete M.D.   On: 05/21/2022 20:30    Anti-infectives: Anti-infectives (From admission, onward)    None        Assessment/Plan Fall  Occult R PTX - Improved to resolved on repeat CXR. On RA. Pulm toilet. Therapies Hx AF on  Eliquis Hx TIA Hx HTN - not on medication at home, prn here.  Hx HLD Hx prosCA s/p radioactive seed implant  Hx bradycardia - noted on tele Hx TIA FEN - Reg VTE - SCDs, ppx Lovenox here.  Will discuss with MD about restarting Eliquis.  This is now patient's second fall in the last few months, may need discussion with PCP/cardiologist if he should remain on anticoagulation (follows with heartcare) ID - None Foley - None, reports he has voided this morning.  Monitor I's/O Plan - Therapies. Lives at [place] and walks with a walker at baseline.    LOS: 0 days    Jillyn Ledger , [facility] 05/22/2022, 8:22 AM Please see Amion for pager number during day hours 7:00am-4:30pm

## 2022-05-23 DIAGNOSIS — S0990XA Unspecified injury of head, initial encounter: Secondary | ICD-10-CM | POA: Diagnosis not present

## 2022-05-23 DIAGNOSIS — S272XXA Traumatic hemopneumothorax, initial encounter: Secondary | ICD-10-CM | POA: Diagnosis not present

## 2022-05-23 MED ORDER — APIXABAN 5 MG PO TABS
5.0000 mg | ORAL_TABLET | Freq: Two times a day (BID) | ORAL | Status: DC
  Administered 2022-05-23: 5 mg via ORAL
  Filled 2022-05-23: qty 1

## 2022-05-23 NOTE — Discharge Summary (Signed)
    Patient ID: Justin Potter [id number] 24-Jan-1926 88 y.o.  Admit date: 05/21/2022 Discharge date: 05/23/2022   Discharge Diagnosis Fall  Occult R PTX Hx AF Hx TIA/CVA Hx HTN Hx HLD Hx prosCA s/p radioactive seed implant  Hx bradycardia Hx macular degeneration   Consultants Cardiology - verbally discussed with their team  Reason for Admission: 88 yo male was tryin got sit and lost balance and fell. He did not lose consciousness. He has no pain. He has no dificulty breathing.  Procedures None  Hospital Course:  Patient presented after a fall as above. Was found to have Occult R PTX. PTX improved to resolved on repeat CXR. On RA.   Patient with hx of a. Fib on eliquis. Originally consulted cardiology to see if could come off Eliquis w/ hx of recent falls. Spoke with patients son, Dr. Adela Lank, who reported that his father had a cardioembolic stroke ~9.3 years ago (I do see this noted on H&P to CIR on 03/14/22) after coming off Eliquis a few days before a GI procedure. Reports with this hx they have already spoken about risks of Eliquis (fall on blood thinners - brain bleed) vs risks of coming off Eliquis with the decision to continue Eliquis. Discussed with cardiology in person. They will ensure he has close f/u.   Patient lives at [place] and walks with a walker at baseline. Patient progressed with therapies to IL + aide and PT/OT status while awaiting to transition to AL. Family in agreement with plan and working on arranging extra help. TOC notifying [facility]. Patient felt stable for d/c on 10/31. He is not taking any narcotic pain medication here so no pain medication rx was sent to the pharmacy. F/u as noted below.   Allergies as of 05/23/2022   No Known Allergies      Medication List     TAKE these medications    acetaminophen 325 MG tablet Commonly known as: TYLENOL Take 2 tablets (650 mg total) by mouth every 6 (six) hours as needed for mild pain  (or Fever >/= 101). What changed: when to take this   ANIMAL CHEWABLE MULTIVITAMIN PO Take 1 tablet by mouth daily.   Eliquis 5 MG Tabs tablet Generic drug: apixaban Take 1 tablet (5 mg total) by mouth 2 (two) times daily.   OCUVITE PRESERVISION PO Take 1 tablet by mouth every morning.          Follow-up Information     Belva Crome, MD Follow up.   Specialty: Cardiology Why: Please keep follow-up with Dr. Tamala Julian on 07/06/2022 at 9:20am. Contact information: [address] [place] [address] [phone]         [name], MD Follow up.   Specialty: Internal Medicine Why: For follow up Contact information: [address] [place] [address] [place] Follow up.   Why: As needed with any questions or concerns. Contact information: [address] [place] [address] [phone]                Signed: Alferd Apa, [facility] 05/23/2022, 10:38 AM Please see Amion for pager number during day hours 7:00am-4:30pm

## 2022-05-23 NOTE — TOC Initial Note (Signed)
Transition of Care [facility]) - Initial/Assessment Note    Patient Details  Name: Justin Potter MRN: [id number] Date of Birth: 10-Jun-1926  Transition of Care [facility]) CM/SW Contact:    Ella Bodo, RN Phone Number: 05/23/2022, 10:17 AM  Clinical Narrative:                 Patient is a  88 y/o male who presents on 10/29 as level 2 trauma due to fall at home and hitting his head. Found to have Rt sided PTX. Recent fall in August resulting in hematoma of gluteus maximus.  PTA, patient fairly independent and living at [facility].  Left message with [facility] to coordinate continuation of home health services.  Spoke with patient's son, Justin Potter: he states he is planning on transitioning patient to ALF level, but in the meantime, will continue therapies and add additional assistance through facility staff. He appreciated my call, and states all arrangements are in order. Home health PT/OT to be provided by [facility]; aides to be provided by [facility],  Expected Discharge Plan: [place] Barriers to Discharge: Continued Medical Work up        Expected Discharge Plan and Services Expected Discharge Plan: [place]   Discharge Planning Services: CM Consult Post Acute Care Choice: [place] arrangements for the past 2 months: [place]                                      Prior Living Arrangements/Services Living arrangements for the past 2 months: [place] Lives with:: Self Patient language and need for interpreter reviewed:: Yes Do you feel safe going back to the place where you live?: Yes      Need for Family Participation in Patient Care: Yes (Comment) Care giver support system in place?: Yes (comment)   Criminal Activity/Legal Involvement Pertinent to Current Situation/Hospitalization: No - Comment as needed  Activities of Daily Living Home Assistive  Devices/Equipment: Walker (specify type)                     Emotional Assessment Appearance:: Appears stated age Attitude/Demeanor/Rapport: Engaged Affect (typically observed): Accepting Orientation: : Oriented to Self, Oriented to Place, Oriented to  Time, Oriented to Situation      Admission diagnosis:  Pneumothorax on right [J93.9] Pneumothorax [J93.9] Fall, initial encounter [W19.XXXA] Patient Active Problem List   Diagnosis Date Noted   Pneumothorax 05/21/2022   Intramuscular hematoma 03/14/2022   Hematoma 03/13/2022   Junctional bradycardia 03/12/2022   Hematoma of right hip 03/12/2022   Dysphagia 11/02/2020   Dysuria 11/02/2020   Acquired thrombophilia ([place]) 11/02/2020   Advanced nonexudative age-related macular degeneration of left eye with subfoveal involvement 05/26/2020   Exudative age-related macular degeneration of right eye with active choroidal neovascularization ([place]) 05/26/2020   Exudative age-related macular degeneration of left eye with inactive choroidal neovascularization ([place]) 05/26/2020   Advanced nonexudative age-related macular degeneration of right eye with subfoveal involvement 05/26/2020   Choroidal neovascularization of right eye 05/26/2020   Pain in right knee 10/07/2019   Long term current use of anticoagulant therapy 09/19/2013   Arthritis    S/P laparoscopic appendectomy Jan 2015 08/28/2013   Hyperlipidemia    Essential hypertension    Persistent atrial fibrillation ([place])    PCP:  [name], MD Pharmacy:  [facility] [place] [facility] [address] [address] [place] [address] Phone: [phone] Fax: [phone]  [id number] [id number]: [phone]: [phone]    Social Determinants of Health (SDOH)  Interventions    Readmission Risk Interventions     No data to display         . JReinaldo Raddle RN, BSN  Trauma/Neuro ICU Case Manager [phone]

## 2022-05-23 NOTE — Progress Notes (Signed)
Physical Therapy Treatment Patient Details Name: Justin Potter MRN: [id number] DOB: 1925-09-20 Today's Date: 05/23/2022   History of Present Illness Patient isa  88 y/o male who presents on 10/29 as level 2 trauma due to fall at home and hitting his head. Found to have Rt sided PTX. Recent fall in August resulting in hematoma of gluteus maximus. PMH includes HTN, blindness, A-fib, prostate ca, TIA, hypotension. (Simultaneous filing. User may not have seen previous data.)    PT Comments    Pt received in bed with son present who reports that pt was trying to climb over bedrails and get up when he first arrived. Pt calm throughout session and oriented to self, location, and situation. Son states that it is possible to get a sitter to be with him at night when he is having some intermittent confusion and increased risk of falling again. Pt mobilizing at min. Min guard A level. Ambulated 180' with RW, slow gait but no LOB. Recommend return to ILF with increased caregiver support and HHPT. PT will continue to follow.   Recommendations for follow up therapy are one component of a multi-disciplinary discharge planning process, led by the attending physician.  Recommendations may be updated based on patient status, additional functional criteria and insurance authorization.  Follow Up Recommendations  Home health PT (plans to transition to ALF)     Assistance Recommended at Discharge Intermittent Supervision/Assistance  Patient can return home with the following A little help with walking and/or transfers;A little help with bathing/dressing/bathroom;Assistance with cooking/housework;Direct supervision/assist for medications management   Equipment Recommendations  None recommended by PT    Recommendations for Other Services       Precautions / Restrictions Precautions Precautions: Fall;Other (comment) Precaution Comments: AMD with limited vision Restrictions Weight Bearing Restrictions: No      Mobility  Bed Mobility Overal bed mobility: Needs Assistance Bed Mobility: Rolling, Sidelying to Sit Rolling: Supervision Sidelying to sit: HOB elevated, Min assist, +2 for physical assistance       General bed mobility comments: pt initially had difficulty sequencing transfer from supine to sit but then was able to coordinate mvmt and use rail to initiate. Needed min A to LE's and trunk to complete task    Transfers Overall transfer level: Needs assistance Equipment used: Rolling walker (2 wheels) Transfers: Sit to/from Stand Sit to Stand: Min guard, From elevated surface           General transfer comment: Min guard for safety with increased time needed to transfer hands from bed to RW and gain balance    Ambulation/Gait Ambulation/Gait assistance: Min assist Gait Distance (Feet): 180 Feet Assistive device: Rolling walker (2 wheels) Gait Pattern/deviations: Step-through pattern, Decreased stride length, Trunk flexed Gait velocity: decreased Gait velocity interpretation: <1.31 ft/sec, indicative of household ambulator   General Gait Details: pt assisted in donning shoes prior to ambulation, son has cut toes of shoes of for assisting pressure ulcers on great toes to heal. Pt needed occasional cues to step feet instead of shuffle. He also needed close guarding with make a tight turn as comes close to stepping on own feet. However, was able to ambulate with min guard throughout   Stairs             Wheelchair Mobility    Modified Rankin (Stroke Patients Only)       Balance Overall balance assessment: Needs assistance, History of Falls Sitting-balance support: Feet supported, No upper extremity supported Sitting balance-Leahy Scale: Good  Standing balance support: During functional activity Standing balance-Leahy Scale: Poor Standing balance comment: Requires UE support in standing.                            Cognition  Arousal/Alertness: Awake/alert Behavior During Therapy: WFL for tasks assessed/performed Overall Cognitive Status: History of cognitive impairments - at baseline                                 General Comments: STM deficits at baseline        Exercises      General Comments General comments (skin integrity, edema, etc.): VSS on RA. Son present. Discussed additional caregiver support recommended including nighttime, escort to dining room, bathing and dressing, and ambulatory times. Pt states that this is doable and pt still getting PT 2x/wk      Pertinent Vitals/Pain Pain Assessment Pain Assessment: Faces Faces Pain Scale: Hurts even more Pain Location: RLE Pain Descriptors / Indicators: Sore, Discomfort, Aching Pain Intervention(s): Monitored during session, Limited activity within patient's tolerance, Repositioned    Home Living Family/patient expects to be discharged to:: Other (Comment) (ILF)                   Additional Comments: [facility], has life alert button as well as pull cords in rooms.    Prior Function            PT Goals (current goals can now be found in the care plan section) Acute Rehab PT Goals Patient Stated Goal: to get stronger and return home PT Goal Formulation: With patient Time For Goal Achievement: 06/05/22 Potential to Achieve Goals: Good Progress towards PT goals: Progressing toward goals    Frequency    Min 3X/week      PT Plan Current plan remains appropriate    Co-evaluation PT/OT/SLP Co-Evaluation/Treatment: Yes Reason for Co-Treatment: Complexity of the patient's impairments (multi-system involvement) (Simultaneous filing. User may not have seen previous data.) PT goals addressed during session: Mobility/safety with mobility;Balance;Proper use of DME OT goals addressed during session: Strengthening/ROM;Proper use of Adaptive equipment and DME;ADL's and self-care      AM-PAC PT "6  Clicks" Mobility   Outcome Measure  Help needed turning from your back to your side while in a flat bed without using bedrails?: A Little Help needed moving from lying on your back to sitting on the side of a flat bed without using bedrails?: A Little Help needed moving to and from a bed to a chair (including a wheelchair)?: A Little Help needed standing up from a chair using your arms (e.g., wheelchair or bedside chair)?: A Little Help needed to walk in hospital room?: A Little Help needed climbing 3-5 steps with a railing? : A Little 6 Click Score: 18    End of Session Equipment Utilized During Treatment: Gait belt Activity Tolerance: Patient tolerated treatment well Patient left: in chair;with call bell/phone within reach;with chair alarm set;with family/visitor present Nurse Communication: Mobility status PT Visit Diagnosis: Pain;Muscle weakness (generalized) (M62.81);Unsteadiness on feet (R26.81);Difficulty in walking, not elsewhere classified (R26.2) Pain - Right/Left: Right Pain - part of body: Hip     Time: 0925-1000 PT Time Calculation (min) (ACUTE ONLY): 35 min  Charges:  $Gait Training: 8-22 mins                     [employer],  PT  Acute Rehab Services Secure chat preferred Office [place] 05/23/2022, 10:28 AM

## 2022-05-23 NOTE — Evaluation (Signed)
Occupational Therapy Evaluation Patient Details Name: Justin Potter MRN: [id number] DOB: [birth year] Today's Date: 05/23/2022   History of Present Illness Patient isa  88 y/o male who presents on 10/29 as level 2 trauma due to fall at home and hitting his head. Found to have Rt sided PTX. Recent fall in August resulting in hematoma of gluteus maximus. PMH includes HTN, blindness, A-fib, prostate ca, TIA, hypotension.   Clinical Impression   Pt in bed upon therapy arrival with Son present at bedside. Patient is requiring slightly more assistance than usual due to recent fall and lack of mobility. I believe that with a little extra assistance once he returns to [facility], he will return to his baseline functioning with BADL. Family is currently working on transitioning pt from [place] to ALF at [facility] and are able to add on additional assistance from staff until transition is complete. Recommend that pt continue to receive HHOT once he is discharged as he was currently receiving therapy prior to admit.       Recommendations for follow up therapy are one component of a multi-disciplinary discharge planning process, led by the attending physician.  Recommendations may be updated based on patient status, additional functional criteria and insurance authorization.   Follow Up Recommendations  Home health OT    Assistance Recommended at Discharge Intermittent Supervision/Assistance  Patient can return home with the following A little help with walking and/or transfers;A little help with bathing/dressing/bathroom;Help with stairs or ramp for entrance;Assist for transportation;Assistance with cooking/housework;Direct supervision/assist for financial management;Direct supervision/assist for medications management    Functional Status Assessment  Patient has had a recent decline in their functional status and demonstrates the ability to make significant improvements in function in a reasonable and  predictable amount of time.  Equipment Recommendations  None recommended by OT       Precautions / Restrictions Precautions Precautions: Fall;Other (comment) Precaution Comments: AMD with limited vision Restrictions Weight Bearing Restrictions: No      Mobility Bed Mobility Overal bed mobility: Needs Assistance Bed Mobility: Rolling, Sidelying to Sit Rolling: Supervision Sidelying to sit: HOB elevated, Min assist       General bed mobility comments: Use of rail to get to EOB, increased time. Min A with trunk. Patient Response: Cooperative  Transfers Overall transfer level: Needs assistance Equipment used: Rolling walker (2 wheels) Transfers: Sit to/from Stand Sit to Stand: Min guard, From elevated surface        Balance Overall balance assessment: Needs assistance, History of Falls Sitting-balance support: Feet supported, No upper extremity supported Sitting balance-Leahy Scale: Good     Standing balance support: During functional activity Standing balance-Leahy Scale: Poor Standing balance comment: Requires UE support in standing.       ADL either performed or assessed with clinical judgement   ADL Overall ADL's : Needs assistance/impaired Eating/Feeding: Set up;Sitting   Grooming: Wash/dry hands;Wash/dry face;Set up;Sitting   Upper Body Bathing: Set up;Sitting   Lower Body Bathing: Minimal assistance;Sit to/from stand   Upper Body Dressing : Set up;Sitting   Lower Body Dressing: Minimal assistance;Sit to/from stand   Toilet Transfer: Agricultural engineer (2 wheels)   Toileting- Water quality scientist and Hygiene: Minimal assistance;Sitting/lateral lean;Sit to/from stand               Vision Baseline Vision/History: 6 Macular Degeneration Ability to See in Adequate Light: 2 Moderately impaired Patient Visual Report: No change from baseline Vision Assessment?: No apparent visual deficits     Perception Perception  Perception:  Impaired Spatial Orientation: slight impairment related to visual impairments   Praxis Praxis Praxis: Impaired Praxis Impairment Details: Motor planning Praxis-Other Comments: slight impairment noted    Pertinent Vitals/Pain Pain Assessment Pain Assessment: Faces Faces Pain Scale: Hurts little more Pain Location: Right hip Pain Descriptors / Indicators: Discomfort, Sore Pain Intervention(s): Monitored during session, Limited activity within patient's tolerance     Hand Dominance Right   Extremity/Trunk Assessment Upper Extremity Assessment Upper Extremity Assessment: Generalized weakness   Lower Extremity Assessment Lower Extremity Assessment: Defer to PT evaluation       Communication Communication Communication: No difficulties   Cognition Arousal/Alertness: Awake/alert Behavior During Therapy: WFL for tasks assessed/performed Overall Cognitive Status: Within Functional Limits for tasks assessed     General Comments: Required VC to initiate tasks and for guidance to navigate hospital floor (also related to visual impairments)                Home Living Family/patient expects to be discharged to:: Other (Comment) (ILF)     Additional Comments: [facility], has life alert button as well as pull cords in rooms.      Prior Functioning/Environment Prior Level of Function : Independent/Modified Independent;Needs assist  Cognitive Assist : ADLs (cognitive)   ADLs (Cognitive): Intermittent cues Physical Assist : ADLs (physical)   ADLs (physical): Bathing;Dressing;IADLs (donning compression socks) Mobility Comments: Active with PT and OT at ILF. Utilized rollator to ambulate to/from dining room. ADLs Comments: independent before this injury. Staff assisting with IADLs and son managing medications        OT Problem List: Impaired balance (sitting and/or standing);Impaired vision/perception;Decreased activity tolerance                 Co-evaluation PT/OT/SLP Co-Evaluation/Treatment: Yes Reason for Co-Treatment: To address functional/ADL transfers   OT goals addressed during session: Strengthening/ROM;Proper use of Adaptive equipment and DME;ADL's and self-care      AM-PAC OT "6 Clicks" Daily Activity     Outcome Measure Help from another person eating meals?: A Little Help from another person taking care of personal grooming?: A Little Help from another person toileting, which includes using toliet, bedpan, or urinal?: A Little Help from another person bathing (including washing, rinsing, drying)?: A Little Help from another person to put on and taking off regular upper body clothing?: A Little Help from another person to put on and taking off regular lower body clothing?: A Little 6 Click Score: 18   End of Session Equipment Utilized During Treatment: Gait belt;Rolling walker (2 wheels) Nurse Communication: Mobility status  Activity Tolerance: Patient tolerated treatment well Patient left: in chair;with call bell/phone within reach;with chair alarm set;with family/visitor present  OT Visit Diagnosis: Muscle weakness (generalized) (M62.81);History of falling (Z91.81)                Time: 6759-1638 OT Time Calculation (min): 34 min Charges:  OT General Charges $OT Visit: 1 Visit OT Evaluation $OT Eval Moderate Complexity: 1 Mod  [employer], OTR/L,CBIS  Supplemental OT - [facility] and [facility]   Fleet Higham, Clarene Duke 05/23/2022, 10:27 AM

## 2022-05-23 NOTE — Progress Notes (Cosign Needed Addendum)
Subjective: CC: Patient initially seemed to not know where he was but after I brought him his glasses and turned on the room lights he was A&O x 3. Complains of R hip pain that he notes is chronic from before admission and unchanged. No new areas of pain and not other areas of pain. Only using tylenol for pain and feels pain is well controlled. No sob. Tolerating diet. No n/v. Voiding. BM on I/O yesterday. Worked with therapies yesterday.   Objective: Vital signs in last 24 hours: Temp:  [97.7 F (36.5 C)] 97.7 F (36.5 C) (10/30 2126) Pulse Rate:  [45-66] 66 (10/30 2126) Resp:  [16] 16 (10/30 2126) BP: (120-140)/(69-79) 140/79 (10/30 2126) SpO2:  [100 %] 100 % (10/30 2126) Last BM Date : 05/22/22  Intake/Output from previous day: 10/30 0701 - 10/31 0700 In: 120 [P.O.:120] Out: 51 [Urine:51] Intake/Output this shift: No intake/output data recorded.  PE: Gen:  Alert, NAD, pleasant Card:  Reg rate Pulm:  CTAB, no W/R/R, effort normal Abd: Soft, ND, NT, +BS Ext:  No LE edema. MAE's Psych: A&Ox3   Lab Results:  Recent Labs    05/21/22 1859 05/22/22 0005  WBC 7.7 12.7*  HGB 13.2 12.8*  HCT 40.5 40.8  PLT 194 190   BMET Recent Labs    05/21/22 1859 05/22/22 0005  NA 143  --   K 3.8  --   CL 109  --   CO2 23  --   GLUCOSE 107*  --   BUN 28*  --   CREATININE 1.04 0.98  CALCIUM 9.6  --    PT/INR No results for input(s): "LABPROT", "INR" in the last 72 hours. CMP     Component Value Date/Time   NA 143 05/21/2022 1859   NA 140 08/13/2018 1035   K 3.8 05/21/2022 1859   CL 109 05/21/2022 1859   CO2 23 05/21/2022 1859   GLUCOSE 107 (H) 05/21/2022 1859   BUN 28 (H) 05/21/2022 1859   BUN 16 08/13/2018 1035   CREATININE 0.98 05/22/2022 0005   CALCIUM 9.6 05/21/2022 1859   PROT 5.4 (L) 03/15/2022 0941   ALBUMIN 2.8 (L) 03/15/2022 0941   AST 19 03/15/2022 0941   ALT 12 03/15/2022 0941   ALKPHOS 42 03/15/2022 0941   BILITOT 1.2 03/15/2022 0941    GFRNONAA >60 05/22/2022 0005   GFRAA 88 08/13/2018 1035   Lipase  No results found for: "LIPASE"  Studies/Results: DG Chest Port 1 View  Result Date: 05/22/2022 CLINICAL DATA:  88 year old male with trace right pneumothorax after fall. EXAM: PORTABLE CHEST 1 VIEW COMPARISON:  Chest CT 05/21/2022 and earlier. FINDINGS: Portable AP semi upright views at 0458 hours. Trace if any right apical pneumothorax. Satisfactory lung volumes and ventilation. Stable cardiomegaly and mediastinal contours. No pleural effusion or pulmonary contusion identified. Stable visualized osseous structures. Negative visible bowel gas. IMPRESSION: 1. Trace if any residual right apical pneumothorax. 2. Cardiomegaly.  No other acute cardiopulmonary abnormality. Electronically Signed   By: Genevie Ann M.D.   On: 05/22/2022 05:47   CT Chest Wo Contrast  Result Date: 05/21/2022 CLINICAL DATA:  Chest trauma, blunt.  Fall. EXAM: CT CHEST WITHOUT CONTRAST TECHNIQUE: Multidetector CT imaging of the chest was performed following the standard protocol without IV contrast. RADIATION DOSE REDUCTION: This exam was performed according to the departmental dose-optimization program which includes automated exposure control, adjustment of the mA and/or kV according to patient size and/or use of iterative  reconstruction technique. COMPARISON:  02/10/2014 FINDINGS: Cardiovascular: Cardiomegaly. Scattered coronary artery and aortic calcifications. No aortic aneurysm. Mediastinum/Nodes: No mediastinal, hilar, or axillary adenopathy. Proximal thoracic esophagus is dilated with air. Trachea and thyroid unremarkable. Lungs/Pleura: Tiny right apical pneumothorax. No confluent airspace opacities or effusions bilaterally. 8 mm left lower lobe pulmonary nodule on image 143. 2 mm left lower lobe pulmonary nodule on image 110. These are stable since prior study compatible with benign nodules. Upper Abdomen: No acute findings Musculoskeletal: No visible rib  fracture. No acute bony abnormality. IMPRESSION: Tiny right apical pneumothorax.  No visible rib fracture. Cardiomegaly, coronary artery disease. Aortic Atherosclerosis (ICD10-I70.0). Electronically Signed   By: Rolm Baptise M.D.   On: 05/21/2022 21:26   DG Chest Portable 1 View  Result Date: 05/21/2022 CLINICAL DATA:  Pneumothorax. EXAM: PORTABLE CHEST 1 VIEW COMPARISON:  Chest radiograph dated 11/02/2020. Cervical spine CT dated 05/21/2022. FINDINGS: There is a small right upper lobe/apical pneumothorax as seen on the prior CT measuring approximately 3 mm in thickness. No focal consolidation, pleural effusion. Borderline cardiomegaly. No acute osseous pathology. IMPRESSION: Small right apical pneumothorax. Electronically Signed   By: Anner Crete M.D.   On: 05/21/2022 20:53   CT Head Wo Contrast  Result Date: 05/21/2022 CLINICAL DATA:  Trauma. EXAM: CT HEAD WITHOUT CONTRAST CT CERVICAL SPINE WITHOUT CONTRAST TECHNIQUE: Multidetector CT imaging of the head and cervical spine was performed following the standard protocol without intravenous contrast. Multiplanar CT image reconstructions of the cervical spine were also generated. RADIATION DOSE REDUCTION: This exam was performed according to the departmental dose-optimization program which includes automated exposure control, adjustment of the mA and/or kV according to patient size and/or use of iterative reconstruction technique. COMPARISON:  CT head CT dated 10/25/2020. FINDINGS: CT HEAD FINDINGS Brain: Mild age-related atrophy and chronic microvascular ischemic changes. Old left occipital infarct. There is no acute intracranial hemorrhage. No mass effect or midline shift. No extra-axial fluid collection. Vascular: No hyperdense vessel or unexpected calcification. Skull: Normal. Negative for fracture or focal lesion. Sinuses/Orbits: No acute finding. Other: None CT CERVICAL SPINE FINDINGS Alignment: No acute subluxation. Skull base and vertebrae: No  acute fracture.  Osteopenia. Soft tissues and spinal canal: No prevertebral fluid or swelling. No visible canal hematoma. Disc levels:  No acute findings.  Multilevel degenerative changes. Upper chest: Biapical subpleural scarring and postsurgical changes of the right lung apex. Minimal right apical pneumothorax measuring up to 3 mm in thickness. Other: Bilateral carotid bulb calcified plaques. IMPRESSION: 1. No acute intracranial pathology. Mild age-related atrophy and chronic microvascular ischemic changes. Old left occipital infarct. 2. No acute/traumatic cervical spine pathology. 3. Minimal scratch and right apical pneumothorax. These results were called by telephone at the time of interpretation on 05/21/2022 at 8:20 pm to provider Sherwood Gambler , who verbally acknowledged these results. Electronically Signed   By: Anner Crete M.D.   On: 05/21/2022 20:30   CT Cervical Spine Wo Contrast  Result Date: 05/21/2022 CLINICAL DATA:  Trauma. EXAM: CT HEAD WITHOUT CONTRAST CT CERVICAL SPINE WITHOUT CONTRAST TECHNIQUE: Multidetector CT imaging of the head and cervical spine was performed following the standard protocol without intravenous contrast. Multiplanar CT image reconstructions of the cervical spine were also generated. RADIATION DOSE REDUCTION: This exam was performed according to the departmental dose-optimization program which includes automated exposure control, adjustment of the mA and/or kV according to patient size and/or use of iterative reconstruction technique. COMPARISON:  CT head CT dated 10/25/2020. FINDINGS: CT HEAD FINDINGS Brain: Mild age-related  atrophy and chronic microvascular ischemic changes. Old left occipital infarct. There is no acute intracranial hemorrhage. No mass effect or midline shift. No extra-axial fluid collection. Vascular: No hyperdense vessel or unexpected calcification. Skull: Normal. Negative for fracture or focal lesion. Sinuses/Orbits: No acute finding. Other:  None CT CERVICAL SPINE FINDINGS Alignment: No acute subluxation. Skull base and vertebrae: No acute fracture.  Osteopenia. Soft tissues and spinal canal: No prevertebral fluid or swelling. No visible canal hematoma. Disc levels:  No acute findings.  Multilevel degenerative changes. Upper chest: Biapical subpleural scarring and postsurgical changes of the right lung apex. Minimal right apical pneumothorax measuring up to 3 mm in thickness. Other: Bilateral carotid bulb calcified plaques. IMPRESSION: 1. No acute intracranial pathology. Mild age-related atrophy and chronic microvascular ischemic changes. Old left occipital infarct. 2. No acute/traumatic cervical spine pathology. 3. Minimal scratch and right apical pneumothorax. These results were called by telephone at the time of interpretation on 05/21/2022 at 8:20 pm to provider Sherwood Gambler , who verbally acknowledged these results. Electronically Signed   By: Anner Crete M.D.   On: 05/21/2022 20:30    Anti-infectives: Anti-infectives (From admission, onward)    None        Assessment/Plan Fall  Occult R PTX - Improved to resolved on repeat CXR. On RA. Pulm toilet. Therapies Hx AF - originally consulted cardiology to see if could come off Eliquis w/ hx of recent falls. Spoke with patients son, Dr. Adela Lank, who reported that his father had a cardioembolic stroke ~9.6 years ago (I do see this noted on H&P to CIR on 03/14/22) after coming off Eliquis a few days before a GI procedure. Reports with this hx they have already spoken about risks of Eliquis (fall on blood thinners - brain bleed) vs risks of coming off Eliquis with the decision to continue Eliquis.  Hx TIA/CVA - see above Hx HTN - not on medication at home, prn here.  Hx HLD Hx prosCA s/p radioactive seed implant  Hx bradycardia - noted on tele Hx macular degeneration  FEN - Reg VTE - SCDs, Eliquis ID - None Foley - None, reports he has voided this morning.  Monitor  I's/O Plan - Therapies. Lives at [place] and walks with a walker at baseline. Discussed with patients son, Dr. Valere Dross, how he did with therapies yesterday and therapies recommendation for transition from independent living to assisted living.  It appears family is already working on transition patient from [place] to [place] at [facility].  Dr. Valere Dross is going to reach out to [facility] and see if they can help expedite this.  In the meantime the patient is reported to already be receiving therapies at [facility]. His son, Dr. Valere Dross reports they can also get an aide to help around the patients [place] home in the interim till he is able to transition to [place]. I have re-written for [facility] PT/OT so he can continue getting therapies at d/c. Will plan for d/c this pm after another session of therapies and if aide for patients [place] home can be arranged while awaiting AL. I have reached out to therapies and asked for them to let me know after he works with therapies and how he does.    LOS: 0 days    Jillyn Ledger , [facility] 05/23/2022, 7:46 AM Please see Amion for pager number during day hours 7:00am-4:30pm

## 2022-05-23 NOTE — Discharge Instructions (Addendum)
PNEUMOTHORAX OR HEMOTHORAX HOME INSTRUCTIONS   PAIN CONTROL:  Pain is best controlled by a usual combination of three different methods TOGETHER:  Ice/Heat Over the counter pain medication Prescription pain medication You may experience some swelling and bruising in area of broken ribs. Ice packs or heating pads (30-60 minutes up to 6 times a day) will help. Use ice for the first few days to help decrease swelling and bruising, then switch to heat to help relax tight/sore spots and speed recovery. Some people prefer to use ice alone, heat alone, alternating between ice & heat. Experiment to what works for you. Swelling and bruising can take several weeks to resolve.  It is helpful to take an over-the-counter pain medication regularly for the first few weeks.  Acetaminophen (Tylenol, etc) 500-'650mg'$  four times a day (every meal & bedtime) A prescription for pain medication (such as oxycodone, hydrocodone, etc) may be given to you upon discharge. Take your pain medication as prescribed.  If you are having problems/concerns with the prescription medicine (does not control pain, nausea, vomiting, rash, itching, etc), please call us [phone] to see if we need to switch you to a different pain medicine that will work better for you and/or control your side effect better. If you need a refill on your pain medication, please contact your pharmacy. They will contact our office to request authorization. Prescriptions will not be filled after 5 pm or on week-ends. Avoid getting constipated. When taking pain medications, it is common to experience some constipation. Increasing fluid intake and taking a fiber supplement (such as Metamucil, Citrucel, FiberCon, MiraLax, etc) 1-2 times a day regularly will usually help prevent this problem from occurring. A mild laxative (prune juice, Milk of Magnesia, MiraLax, etc) should be taken according to package directions if there are no bowel movements after 48 hours.   Watch out for diarrhea. If you have many loose bowel movements, simplify your diet to bland foods & liquids for a few days. Stop any stool softeners and decrease your fiber supplement. Switching to mild anti-diarrheal medications (Kayopectate, Pepto Bismol) can help. If this worsens or does not improve, please call us. FOLLOW UP: You can follow up with our office as needed. Call with any questions or concerns.  WHEN TO CALL us [phone]:  Poor pain control Reactions / problems with new medications (rash/itching, nausea, etc)  Fever  Worsening swelling or bruising Worsening pain, productive cough, difficulty breathing or any other concerning symptoms  The clinic staff is available to answer your questions during regular business hours (8:30am-5pm). Please don't hesitate to call and ask to speak to one of our nurses for clinical concerns.  If you have a medical emergency, go to the nearest emergency room or call 911.  A surgeon from [facility] is always on call at the [facility], [place], [place], [place], [place] [address] ?  MAIN: [phone] ? TOLL FREE: [phone] ?  FAX [phone]  [email or web address]     Pneumothorax A pneumothorax is commonly called a collapsed lung. It is a condition in which air leaks from a lung and builds up between the thin layer of tissue that covers the lungs (visceral pleura) and the interior wall of the chest cavity (parietal pleura). The air gets trapped outside the lung, between the lung and the chest wall (pleural space). The air takes up space and prevents the lung from fully expanding. This condition sometimes occurs suddenly  with no apparent cause. The buildup of air may be small or large. A small pneumothorax may go away on its own. A large pneumothorax will require treatment and hospitalization. What are the causes? This condition may be caused by: Trauma and injury  to the chest wall. Surgery and other medical procedures. A complication of an underlying lung problem, especially chronic obstructive pulmonary disease (COPD) or emphysema. Sometimes the cause of this condition is not known. What increases the risk? You are more likely to develop this condition if: You have an underlying lung problem. You smoke. You are 66-68 years old, male, tall, and underweight. You have a personal or family history of pneumothorax. You have an eating disorder (anorexia nervosa). This condition can also happen quickly, even in people with no history of lung problems. What are the signs or symptoms? Sometimes a pneumothorax will have no symptoms. When symptoms are present, they can include: Chest pain. Shortness of breath. Increased rate of breathing. Bluish color to your lips or skin (cyanosis). How is this diagnosed? This condition may be diagnosed by: A medical history and physical exam. A chest X-ray, chest CT scan, or ultrasound. How is this treated? Treatment depends on how severe your condition is. The goal of treatment is to remove the extra air and allow your lung to expand back to its normal size. For a small pneumothorax: No treatment may be needed. Extra oxygen is sometimes used to make it go away more quickly. For a large pneumothorax or a pneumothorax that is causing symptoms, a procedure is done to drain the air from your lungs. To do this, a health care provider may use: A needle with a syringe. This is used to suck air from a pleural space where no additional leakage is taking place. A chest tube. This is used to suck air where there is ongoing leakage into the pleural space. The chest tube may need to remain in place for several days until the air leak has healed. In more severe cases, surgery may be needed to repair the damage that is causing the leak. If you have multiple pneumothorax episodes or have an air leak that will not heal, a procedure  called a pleurodesis may be done. A medicine is placed in the pleural space to irritate the tissues around the lung so that the lung will stick to the chest wall, seal any leaks, and stop any buildup of air in that space. If you have an underlying lung problem, severe symptoms, or a large pneumothorax you will usually need to stay in the hospital. Follow these instructions at home: Lifestyle Do not use any products that contain nicotine or tobacco, such as cigarettes and e-cigarettes. These are major risk factors in pneumothorax. If you need help quitting, ask your health care provider. Do not lift anything that is heavier than 10 lb (4.5 kg), or the limit that your health care provider tells you, until he or she says that it is safe. Avoid activities that take a lot of effort (strenuous) for as long as told by your health care provider. Return to your normal activities as told by your health care provider. Ask your health care provider what activities are safe for you. Do not fly in an airplane or scuba dive until your health care provider says it is okay. General instructions Take over-the-counter and prescription medicines only as told by your health care provider. If a cough or pain makes it difficult for you to sleep at night,  try sleeping in a semi-upright position in a recliner or by using 2 or 3 pillows. If you had a chest tube and it was removed, ask your health care provider when you can remove the bandage (dressing). While the dressing is in place, do not allow it to get wet. Keep all follow-up visits as told by your health care provider. This is important. Contact a health care provider if: You cough up thick mucus (sputum) that is yellow or green in color. You were treated with a chest tube, and you have redness, increasing pain, or discharge at the site where it was placed. Get help right away if: You have increasing chest pain or shortness of breath. You have a cough that will not  go away. You begin coughing up blood. You have pain that is getting worse or is not controlled with medicines. The site where your chest tube was located opens up. You feel air coming out of the site where the chest tube was placed. You have a fever or persistent symptoms for more than 2-3 days. You have a fever and your symptoms suddenly get worse. These symptoms may represent a serious problem that is an emergency. Do not wait to see if the symptoms will go away. Get medical help right away. Call your local emergency services (911 in the [place] Do not drive yourself to the hospital. Summary A pneumothorax, commonly called a collapsed lung, is a condition in which air leaks from a lung and gets trapped between the lung and the chest wall (pleural space). The buildup of air may be small or large. A small pneumothorax may go away on its own. A large pneumothorax will require treatment and hospitalization. Treatment for this condition depends on how severe the pneumothorax is. The goal of treatment is to remove the extra air and allow the lung to expand back to its normal size. This information is not intended to replace advice given to you by your health care provider. Make sure you discuss any questions you have with your health care provider. Document Released: 07/10/2005 Document Revised: 06/18/2017 Document Reviewed: 06/18/2017 Elsevier Interactive Patient Education  2019 [employer].   ===================================================================================================  Information on my medicine - ELIQUIS (apixaban)  This medication education was reviewed with me or my healthcare representative as part of my discharge preparation.    Why was Eliquis prescribed for you? Eliquis was prescribed for you to reduce the risk of a blood clot forming that can cause a stroke if you have a medical condition called atrial fibrillation (a type of irregular heartbeat).  What do  You need to know about Eliquis ? Take your Eliquis TWICE DAILY - one tablet in the morning and one tablet in the evening with or without food. If you have difficulty swallowing the tablet whole please discuss with your pharmacist how to take the medication safely.  Take Eliquis exactly as prescribed by your doctor and DO NOT stop taking Eliquis without talking to the doctor who prescribed the medication.  Stopping may increase your risk of developing a stroke.  Refill your prescription before you run out.  After discharge, you should have regular check-up appointments with your healthcare provider that is prescribing your Eliquis.  In the future your dose may need to be changed if your kidney function or weight changes by a significant amount or as you get older.  What do you do if you miss a dose? If you miss a dose, take it as soon as  you remember on the same day and resume taking twice daily.  Do not take more than one dose of ELIQUIS at the same time to make up a missed dose.  Important Safety Information A possible side effect of Eliquis is bleeding. You should call your healthcare provider right away if you experience any of the following: Bleeding from an injury or your nose that does not stop. Unusual colored urine (red or dark brown) or unusual colored stools (red or black). Unusual bruising for unknown reasons. A serious fall or if you hit your head (even if there is no bleeding).  Some medicines may interact with Eliquis and might increase your risk of bleeding or clotting while on Eliquis. To help avoid this, consult your healthcare provider or pharmacist prior to using any new prescription or non-prescription medications, including herbals, vitamins, non-steroidal anti-inflammatory drugs (NSAIDs) and supplements.  This website has more information on Eliquis (apixaban): http://www.eliquis.com/eliquis/home

## 2022-05-24 DIAGNOSIS — R262 Difficulty in walking, not elsewhere classified: Secondary | ICD-10-CM | POA: Diagnosis not present

## 2022-05-24 DIAGNOSIS — Z9181 History of falling: Secondary | ICD-10-CM | POA: Diagnosis not present

## 2022-05-24 DIAGNOSIS — M25551 Pain in right hip: Secondary | ICD-10-CM | POA: Diagnosis not present

## 2022-05-24 DIAGNOSIS — M6281 Muscle weakness (generalized): Secondary | ICD-10-CM | POA: Diagnosis not present

## 2022-05-25 DIAGNOSIS — M6281 Muscle weakness (generalized): Secondary | ICD-10-CM | POA: Diagnosis not present

## 2022-05-25 DIAGNOSIS — R262 Difficulty in walking, not elsewhere classified: Secondary | ICD-10-CM | POA: Diagnosis not present

## 2022-05-25 DIAGNOSIS — Z9181 History of falling: Secondary | ICD-10-CM | POA: Diagnosis not present

## 2022-05-25 DIAGNOSIS — M25551 Pain in right hip: Secondary | ICD-10-CM | POA: Diagnosis not present

## 2022-05-26 DIAGNOSIS — M6281 Muscle weakness (generalized): Secondary | ICD-10-CM | POA: Diagnosis not present

## 2022-05-26 DIAGNOSIS — Z9181 History of falling: Secondary | ICD-10-CM | POA: Diagnosis not present

## 2022-05-26 DIAGNOSIS — R262 Difficulty in walking, not elsewhere classified: Secondary | ICD-10-CM | POA: Diagnosis not present

## 2022-05-26 DIAGNOSIS — M25551 Pain in right hip: Secondary | ICD-10-CM | POA: Diagnosis not present

## 2022-05-29 ENCOUNTER — Encounter (INDEPENDENT_AMBULATORY_CARE_PROVIDER_SITE_OTHER): Payer: Self-pay

## 2022-05-29 ENCOUNTER — Encounter (INDEPENDENT_AMBULATORY_CARE_PROVIDER_SITE_OTHER): Payer: Medicare Other | Admitting: Ophthalmology

## 2022-05-29 DIAGNOSIS — H353124 Nonexudative age-related macular degeneration, left eye, advanced atrophic with subfoveal involvement: Secondary | ICD-10-CM | POA: Diagnosis not present

## 2022-05-29 DIAGNOSIS — H353221 Exudative age-related macular degeneration, left eye, with active choroidal neovascularization: Secondary | ICD-10-CM | POA: Diagnosis not present

## 2022-05-29 DIAGNOSIS — H353114 Nonexudative age-related macular degeneration, right eye, advanced atrophic with subfoveal involvement: Secondary | ICD-10-CM | POA: Diagnosis not present

## 2022-05-30 ENCOUNTER — Encounter (INDEPENDENT_AMBULATORY_CARE_PROVIDER_SITE_OTHER): Payer: Medicare Other | Admitting: Ophthalmology

## 2022-05-30 DIAGNOSIS — R262 Difficulty in walking, not elsewhere classified: Secondary | ICD-10-CM | POA: Diagnosis not present

## 2022-05-30 DIAGNOSIS — Z9181 History of falling: Secondary | ICD-10-CM | POA: Diagnosis not present

## 2022-05-30 DIAGNOSIS — M25551 Pain in right hip: Secondary | ICD-10-CM | POA: Diagnosis not present

## 2022-05-30 DIAGNOSIS — M6281 Muscle weakness (generalized): Secondary | ICD-10-CM | POA: Diagnosis not present

## 2022-05-31 DIAGNOSIS — R262 Difficulty in walking, not elsewhere classified: Secondary | ICD-10-CM | POA: Diagnosis not present

## 2022-05-31 DIAGNOSIS — M6281 Muscle weakness (generalized): Secondary | ICD-10-CM | POA: Diagnosis not present

## 2022-05-31 DIAGNOSIS — M25551 Pain in right hip: Secondary | ICD-10-CM | POA: Diagnosis not present

## 2022-05-31 DIAGNOSIS — Z9181 History of falling: Secondary | ICD-10-CM | POA: Diagnosis not present

## 2022-06-01 ENCOUNTER — Other Ambulatory Visit: Payer: Self-pay

## 2022-06-01 DIAGNOSIS — I480 Paroxysmal atrial fibrillation: Secondary | ICD-10-CM | POA: Diagnosis not present

## 2022-06-01 DIAGNOSIS — D6869 Other thrombophilia: Secondary | ICD-10-CM | POA: Diagnosis not present

## 2022-06-01 DIAGNOSIS — R269 Unspecified abnormalities of gait and mobility: Secondary | ICD-10-CM | POA: Diagnosis not present

## 2022-06-01 DIAGNOSIS — Z9181 History of falling: Secondary | ICD-10-CM | POA: Diagnosis not present

## 2022-06-01 DIAGNOSIS — I10 Essential (primary) hypertension: Secondary | ICD-10-CM | POA: Diagnosis not present

## 2022-06-01 DIAGNOSIS — Z111 Encounter for screening for respiratory tuberculosis: Secondary | ICD-10-CM | POA: Diagnosis not present

## 2022-06-01 DIAGNOSIS — M25551 Pain in right hip: Secondary | ICD-10-CM | POA: Diagnosis not present

## 2022-06-01 DIAGNOSIS — R001 Bradycardia, unspecified: Secondary | ICD-10-CM | POA: Diagnosis not present

## 2022-06-01 DIAGNOSIS — M6281 Muscle weakness (generalized): Secondary | ICD-10-CM | POA: Diagnosis not present

## 2022-06-01 DIAGNOSIS — R262 Difficulty in walking, not elsewhere classified: Secondary | ICD-10-CM | POA: Diagnosis not present

## 2022-06-01 MED ORDER — COVID-19 MRNA 2023-2024 VACCINE (COMIRNATY) 0.3 ML INJECTION
0.3000 mL | Freq: Once | INTRAMUSCULAR | 0 refills | Status: AC
  Filled 2022-06-01: qty 0.3, 1d supply, fill #0

## 2022-06-02 DIAGNOSIS — Z9181 History of falling: Secondary | ICD-10-CM | POA: Diagnosis not present

## 2022-06-02 DIAGNOSIS — M25551 Pain in right hip: Secondary | ICD-10-CM | POA: Diagnosis not present

## 2022-06-02 DIAGNOSIS — M6281 Muscle weakness (generalized): Secondary | ICD-10-CM | POA: Diagnosis not present

## 2022-06-02 DIAGNOSIS — R262 Difficulty in walking, not elsewhere classified: Secondary | ICD-10-CM | POA: Diagnosis not present

## 2022-06-05 DIAGNOSIS — M25551 Pain in right hip: Secondary | ICD-10-CM | POA: Diagnosis not present

## 2022-06-05 DIAGNOSIS — Z9181 History of falling: Secondary | ICD-10-CM | POA: Diagnosis not present

## 2022-06-05 DIAGNOSIS — R262 Difficulty in walking, not elsewhere classified: Secondary | ICD-10-CM | POA: Diagnosis not present

## 2022-06-05 DIAGNOSIS — M6281 Muscle weakness (generalized): Secondary | ICD-10-CM | POA: Diagnosis not present

## 2022-06-06 DIAGNOSIS — Z9181 History of falling: Secondary | ICD-10-CM | POA: Diagnosis not present

## 2022-06-06 DIAGNOSIS — M25551 Pain in right hip: Secondary | ICD-10-CM | POA: Diagnosis not present

## 2022-06-06 DIAGNOSIS — M6281 Muscle weakness (generalized): Secondary | ICD-10-CM | POA: Diagnosis not present

## 2022-06-06 DIAGNOSIS — R262 Difficulty in walking, not elsewhere classified: Secondary | ICD-10-CM | POA: Diagnosis not present

## 2022-06-08 DIAGNOSIS — Z9181 History of falling: Secondary | ICD-10-CM | POA: Diagnosis not present

## 2022-06-08 DIAGNOSIS — M25551 Pain in right hip: Secondary | ICD-10-CM | POA: Diagnosis not present

## 2022-06-08 DIAGNOSIS — M6281 Muscle weakness (generalized): Secondary | ICD-10-CM | POA: Diagnosis not present

## 2022-06-08 DIAGNOSIS — R262 Difficulty in walking, not elsewhere classified: Secondary | ICD-10-CM | POA: Diagnosis not present

## 2022-06-12 DIAGNOSIS — Z9181 History of falling: Secondary | ICD-10-CM | POA: Diagnosis not present

## 2022-06-12 DIAGNOSIS — M25551 Pain in right hip: Secondary | ICD-10-CM | POA: Diagnosis not present

## 2022-06-12 DIAGNOSIS — M6281 Muscle weakness (generalized): Secondary | ICD-10-CM | POA: Diagnosis not present

## 2022-06-12 DIAGNOSIS — R262 Difficulty in walking, not elsewhere classified: Secondary | ICD-10-CM | POA: Diagnosis not present

## 2022-06-14 DIAGNOSIS — M25551 Pain in right hip: Secondary | ICD-10-CM | POA: Diagnosis not present

## 2022-06-14 DIAGNOSIS — R262 Difficulty in walking, not elsewhere classified: Secondary | ICD-10-CM | POA: Diagnosis not present

## 2022-06-14 DIAGNOSIS — M6281 Muscle weakness (generalized): Secondary | ICD-10-CM | POA: Diagnosis not present

## 2022-06-14 DIAGNOSIS — Z9181 History of falling: Secondary | ICD-10-CM | POA: Diagnosis not present

## 2022-06-16 DIAGNOSIS — R262 Difficulty in walking, not elsewhere classified: Secondary | ICD-10-CM | POA: Diagnosis not present

## 2022-06-16 DIAGNOSIS — M25551 Pain in right hip: Secondary | ICD-10-CM | POA: Diagnosis not present

## 2022-06-16 DIAGNOSIS — Z9181 History of falling: Secondary | ICD-10-CM | POA: Diagnosis not present

## 2022-06-16 DIAGNOSIS — M6281 Muscle weakness (generalized): Secondary | ICD-10-CM | POA: Diagnosis not present

## 2022-06-19 DIAGNOSIS — M6281 Muscle weakness (generalized): Secondary | ICD-10-CM | POA: Diagnosis not present

## 2022-06-19 DIAGNOSIS — Z9181 History of falling: Secondary | ICD-10-CM | POA: Diagnosis not present

## 2022-06-19 DIAGNOSIS — M25551 Pain in right hip: Secondary | ICD-10-CM | POA: Diagnosis not present

## 2022-06-19 DIAGNOSIS — R262 Difficulty in walking, not elsewhere classified: Secondary | ICD-10-CM | POA: Diagnosis not present

## 2022-06-20 DIAGNOSIS — M25551 Pain in right hip: Secondary | ICD-10-CM | POA: Diagnosis not present

## 2022-06-20 DIAGNOSIS — Z9181 History of falling: Secondary | ICD-10-CM | POA: Diagnosis not present

## 2022-06-20 DIAGNOSIS — R262 Difficulty in walking, not elsewhere classified: Secondary | ICD-10-CM | POA: Diagnosis not present

## 2022-06-20 DIAGNOSIS — M6281 Muscle weakness (generalized): Secondary | ICD-10-CM | POA: Diagnosis not present

## 2022-06-21 DIAGNOSIS — M6281 Muscle weakness (generalized): Secondary | ICD-10-CM | POA: Diagnosis not present

## 2022-06-21 DIAGNOSIS — R262 Difficulty in walking, not elsewhere classified: Secondary | ICD-10-CM | POA: Diagnosis not present

## 2022-06-21 DIAGNOSIS — M25551 Pain in right hip: Secondary | ICD-10-CM | POA: Diagnosis not present

## 2022-06-21 DIAGNOSIS — Z9181 History of falling: Secondary | ICD-10-CM | POA: Diagnosis not present

## 2022-06-22 DIAGNOSIS — M25551 Pain in right hip: Secondary | ICD-10-CM | POA: Diagnosis not present

## 2022-06-22 DIAGNOSIS — M6281 Muscle weakness (generalized): Secondary | ICD-10-CM | POA: Diagnosis not present

## 2022-06-22 DIAGNOSIS — Z9181 History of falling: Secondary | ICD-10-CM | POA: Diagnosis not present

## 2022-06-22 DIAGNOSIS — R262 Difficulty in walking, not elsewhere classified: Secondary | ICD-10-CM | POA: Diagnosis not present

## 2022-06-23 DIAGNOSIS — R262 Difficulty in walking, not elsewhere classified: Secondary | ICD-10-CM | POA: Diagnosis not present

## 2022-06-23 DIAGNOSIS — M6281 Muscle weakness (generalized): Secondary | ICD-10-CM | POA: Diagnosis not present

## 2022-06-23 DIAGNOSIS — M25551 Pain in right hip: Secondary | ICD-10-CM | POA: Diagnosis not present

## 2022-06-23 DIAGNOSIS — Z9181 History of falling: Secondary | ICD-10-CM | POA: Diagnosis not present

## 2022-06-26 DIAGNOSIS — M25551 Pain in right hip: Secondary | ICD-10-CM | POA: Diagnosis not present

## 2022-06-26 DIAGNOSIS — M6281 Muscle weakness (generalized): Secondary | ICD-10-CM | POA: Diagnosis not present

## 2022-06-26 DIAGNOSIS — Z9181 History of falling: Secondary | ICD-10-CM | POA: Diagnosis not present

## 2022-06-26 DIAGNOSIS — R262 Difficulty in walking, not elsewhere classified: Secondary | ICD-10-CM | POA: Diagnosis not present

## 2022-06-27 DIAGNOSIS — M6281 Muscle weakness (generalized): Secondary | ICD-10-CM | POA: Diagnosis not present

## 2022-06-27 DIAGNOSIS — R262 Difficulty in walking, not elsewhere classified: Secondary | ICD-10-CM | POA: Diagnosis not present

## 2022-06-27 DIAGNOSIS — Z9181 History of falling: Secondary | ICD-10-CM | POA: Diagnosis not present

## 2022-06-27 DIAGNOSIS — M25551 Pain in right hip: Secondary | ICD-10-CM | POA: Diagnosis not present

## 2022-06-28 ENCOUNTER — Emergency Department (HOSPITAL_COMMUNITY): Payer: Medicare Other

## 2022-06-28 ENCOUNTER — Emergency Department (HOSPITAL_COMMUNITY)
Admission: EM | Admit: 2022-06-28 | Discharge: 2022-06-29 | Disposition: A | Discharge status: Home / Self Care | Payer: Medicare Other | Attending: Emergency Medicine | Admitting: Emergency Medicine

## 2022-06-28 ENCOUNTER — Other Ambulatory Visit: Payer: Self-pay

## 2022-06-28 DIAGNOSIS — M25519 Pain in unspecified shoulder: Secondary | ICD-10-CM | POA: Diagnosis not present

## 2022-06-28 DIAGNOSIS — R2689 Other abnormalities of gait and mobility: Secondary | ICD-10-CM | POA: Insufficient documentation

## 2022-06-28 DIAGNOSIS — Z7901 Long term (current) use of anticoagulants: Secondary | ICD-10-CM | POA: Insufficient documentation

## 2022-06-28 DIAGNOSIS — R030 Elevated blood-pressure reading, without diagnosis of hypertension: Secondary | ICD-10-CM | POA: Insufficient documentation

## 2022-06-28 DIAGNOSIS — W010XXA Fall on same level from slipping, tripping and stumbling without subsequent striking against object, initial encounter: Secondary | ICD-10-CM | POA: Diagnosis not present

## 2022-06-28 DIAGNOSIS — S299XXA Unspecified injury of thorax, initial encounter: Secondary | ICD-10-CM | POA: Diagnosis not present

## 2022-06-28 DIAGNOSIS — M5030 Other cervical disc degeneration, unspecified cervical region: Secondary | ICD-10-CM | POA: Diagnosis not present

## 2022-06-28 DIAGNOSIS — W19XXXA Unspecified fall, initial encounter: Secondary | ICD-10-CM | POA: Diagnosis not present

## 2022-06-28 DIAGNOSIS — R2681 Unsteadiness on feet: Secondary | ICD-10-CM

## 2022-06-28 DIAGNOSIS — R531 Weakness: Secondary | ICD-10-CM | POA: Diagnosis not present

## 2022-06-28 DIAGNOSIS — S0990XA Unspecified injury of head, initial encounter: Secondary | ICD-10-CM | POA: Diagnosis not present

## 2022-06-28 DIAGNOSIS — M542 Cervicalgia: Secondary | ICD-10-CM | POA: Insufficient documentation

## 2022-06-28 DIAGNOSIS — Z043 Encounter for examination and observation following other accident: Secondary | ICD-10-CM | POA: Diagnosis not present

## 2022-06-28 DIAGNOSIS — R269 Unspecified abnormalities of gait and mobility: Secondary | ICD-10-CM | POA: Diagnosis not present

## 2022-06-28 LAB — COMPREHENSIVE METABOLIC PANEL
ALT: 17 U/L (ref 0–44)
AST: 24 U/L (ref 15–41)
Albumin: 4 g/dL (ref 3.5–5.0)
Alkaline Phosphatase: 56 U/L (ref 38–126)
Anion gap: 12 (ref 5–15)
BUN: 17 mg/dL (ref 8–23)
CO2: 24 mmol/L (ref 22–32)
Calcium: 9.7 mg/dL (ref 8.9–10.3)
Chloride: 104 mmol/L (ref 98–111)
Creatinine, Ser: 1.02 mg/dL (ref 0.61–1.24)
GFR, Estimated: >60 mL/min (ref >60)
Glucose, Bld: 111 mg/dL — ABNORMAL HIGH (ref 70–99)
Potassium: 4.2 mmol/L (ref 3.5–5.1)
Sodium: 140 mmol/L (ref 135–145)
Total Bilirubin: 0.7 mg/dL (ref 0.3–1.2)
Total Protein: 6.9 g/dL (ref 6.5–8.1)

## 2022-06-28 LAB — CBC WITH DIFFERENTIAL/PLATELET
Abs Immature Granulocytes: 0.03 10*3/uL (ref 0.00–0.07)
Basophils Absolute: 0.1 10*3/uL (ref 0.0–0.1)
Basophils Relative: 1 %
Eosinophils Absolute: 0.1 10*3/uL (ref 0.0–0.5)
Eosinophils Relative: 1 %
HCT: 39.3 % (ref 39.0–52.0)
Hemoglobin: 12.2 g/dL — ABNORMAL LOW (ref 13.0–17.0)
Immature Granulocytes: 0 %
Lymphocytes Relative: 17 %
Lymphs Abs: 1.5 10*3/uL (ref 0.7–4.0)
MCH: 29.5 pg (ref 26.0–34.0)
MCHC: 31 g/dL (ref 30.0–36.0)
MCV: 94.9 fL (ref 80.0–100.0)
Monocytes Absolute: 0.8 10*3/uL (ref 0.1–1.0)
Monocytes Relative: 8 %
Neutro Abs: 6.5 10*3/uL (ref 1.7–7.7)
Neutrophils Relative %: 73 %
Platelets: 254 10*3/uL (ref 150–400)
RBC: 4.14 MIL/uL — ABNORMAL LOW (ref 4.22–5.81)
RDW: 13.9 % (ref 11.5–15.5)
WBC: 8.9 10*3/uL (ref 4.0–10.5)
nRBC: 0 % (ref 0.0–0.2)

## 2022-06-28 LAB — PROTIME-INR
INR: 1.7 — ABNORMAL HIGH (ref 0.8–1.2)
Prothrombin Time: 19.4 seconds — ABNORMAL HIGH (ref 11.4–15.2)

## 2022-06-28 LAB — ETHANOL: Alcohol, Ethyl (B): <10 mg/dL (ref <10)

## 2022-06-28 LAB — SAMPLE TO BLOOD BANK

## 2022-06-28 LAB — LACTIC ACID, PLASMA: Lactic Acid, Venous: 1.7 mmol/L (ref 0.5–1.9)

## 2022-06-28 NOTE — ED Notes (Signed)
Patient transported to CT 

## 2022-06-28 NOTE — ED Triage Notes (Signed)
Pt BIB [facility] from [facility] for a level 2 mechanical fall on thinners. Pt on eliquis. Pt c/o weakness, neck, and shoulder pain. C-collar in place. No LOC. VS- 181/100, 68  HR, 100% RA, CBG 141 HX- Afib, bradycardia

## 2022-06-28 NOTE — Progress Notes (Signed)
Orthopedic Tech Progress Note Patient Details:  Justin Potter September 14, 1925 [id number]  Patient ID: [name], male   DOB: 10-25-1925, 88 y.o.   MRN: [id number] Level II not currently needed. [place] A Deandrea Rion 06/28/2022, 11:01 PM

## 2022-06-28 NOTE — ED Notes (Signed)
Transported to xray 

## 2022-06-28 NOTE — ED Provider Notes (Signed)
[facility] EMERGENCY DEPARTMENT Provider Note   CSN: [id number] Arrival date & time: 06/28/22  2202     History {Add pertinent medical, surgical, social history, OB history to HPI:1} Chief Complaint  Patient presents with  . Fall    Justin Potter is a 88 y.o. male.  Patient is a 88 year old male with a past medical history of A-fib on Eliquis presenting to the emergency department after a fall.  Patient is here with his son who states that he took him to the dentist earlier today and he was complaining of some weakness in his legs.  He states he normally walks with a walker.  The patient states that he was in the kitchen this evening at his assisted living when his legs gave out on him causing him to fall.  He states that he did hit his head but denies any loss of consciousness.  He states that he is having some pain in the left lower part of his neck.  He denied any chest pain, shortness of breath or lightheadedness prior to the fall.  He denies any numbness or weakness.  The history is provided by the patient and a relative.  Fall      Home Medications Prior to Admission medications   Medication Sig Start Date End Date Taking? Authorizing Provider  acetaminophen (TYLENOL) 325 MG tablet Take 2 tablets (650 mg total) by mouth every 6 (six) hours as needed for mild pain (or Fever >/= 101). Patient taking differently: Take 650 mg by mouth 2 (two) times daily. 03/23/22   Angiulli, Lavon Paganini, PA-C  ELIQUIS 5 MG TABS tablet Take 1 tablet (5 mg total) by mouth 2 (two) times daily. 03/23/22   Angiulli, Lavon Paganini, PA-C  Multiple Vitamins-Minerals (OCUVITE PRESERVISION PO) Take 1 tablet by mouth every morning.  Patient not taking: Reported on 05/22/2022    [provider]  Pediatric Multiple Vit-C-FA (ANIMAL CHEWABLE MULTIVITAMIN PO) Take 1 tablet by mouth daily.    [provider]      Allergies    Patient has no known allergies.    Review of Systems    Review of Systems  Physical Exam Updated Vital Signs BP (!) 154/88 (BP Location: Right Arm)   Pulse 66   Temp 97.8 F (36.6 C) (Oral)   Resp 16   Ht '6\' 2"'$  (1.88 m)   Wt 77.1 kg   SpO2 97%   BMI 21.83 kg/m  Physical Exam Vitals and nursing note reviewed.  Constitutional:      General: He is not in acute distress.    Appearance: Normal appearance.  HENT:     Head: Normocephalic and atraumatic.     Nose: Nose normal.     Mouth/Throat:     Mouth: Mucous membranes are moist.     Pharynx: Oropharynx is clear.  Eyes:     Extraocular Movements: Extraocular movements intact.     Conjunctiva/sclera: Conjunctivae normal.     Pupils: Pupils are equal, round, and reactive to light.  Neck:     Comments: No midline neck tenderness Left-sided lower cervical paraspinal muscle tenderness to palpation C-collar in place Cardiovascular:     Rate and Rhythm: Normal rate and regular rhythm.     Pulses: Normal pulses.     Heart sounds: Normal heart sounds.  Pulmonary:     Effort: Pulmonary effort is normal.     Breath sounds: Normal breath sounds.  Abdominal:     General: Abdomen  is flat.     Palpations: Abdomen is soft.     Tenderness: There is no abdominal tenderness.  Musculoskeletal:        General: Normal range of motion.     Comments: No midline back tenderness No bony tenderness to bilateral upper or lower extremities Pelvis stable, nontender  Skin:    General: Skin is warm and dry.  Neurological:     General: No focal deficit present.     Mental Status: He is alert and oriented to person, place, and time.     Sensory: No sensory deficit.     Motor: No weakness.  Psychiatric:        Mood and Affect: Mood normal.        Behavior: Behavior normal.    ED Results / Procedures / Treatments   Labs (all labs ordered are listed, but only abnormal results are displayed) Labs Reviewed  COMPREHENSIVE METABOLIC PANEL  ETHANOL  URINALYSIS, ROUTINE W REFLEX MICROSCOPIC  LACTIC  ACID, PLASMA  PROTIME-INR  CBC WITH DIFFERENTIAL/PLATELET  I-STAT CHEM 8, ED  SAMPLE TO BLOOD BANK    EKG None  Radiology No results found.  Procedures Procedures  {Document cardiac monitor, telemetry assessment procedure when appropriate:1}  Medications Ordered in ED Medications - No data to display  ED Course/ Medical Decision Making/ A&P Clinical Course as of 06/28/22 2337  Wed Jun 28, 2022  2337 Patient signed out to Dr. Dina Rich pending labs. [VK]    Clinical Course User Index [VK] Kemper Durie, DO                           Medical Decision Making This patient presents to the ED with chief complaint(s) of fall with pertinent past medical history of A-fib on Eliquis which further complicates the presenting complaint. The complaint involves an extensive differential diagnosis and also carries with it a high risk of complications and morbidity.    The differential diagnosis includes ICH, mass effect, cervical spine fracture, spinal cord injury less likely as patient has no neurologic deficits, no other traumatic injuries seen on exam, consider anemia, ACS, arrhythmia, electrolyte abnormality, infection, dehydration as causes of his weakness  Additional history obtained: Additional history obtained from family Records reviewed previous admission documents  ED Course and Reassessment: Patient was made a level 2 trauma on arrival due to his fall on thinners. I was immediately present at bedside on arrival. Primary survey intact. Secondary survey significant for L-sided neck tenderness. C-collar in place. No neuro findings. Patient will have work up to evaluate for traumatic injury and cause of weakness.  Independent labs interpretation:  The following labs were independently interpreted: Pending  Independent visualization of imaging: - I independently visualized the following imaging with scope of interpretation limited to determining acute life threatening  conditions related to emergency care: CTH/C-spine, which revealed No acute traumatic injury  Consultation: - Consulted or discussed management/test interpretation w/ external professional: ***  Consideration for admission or further workup: Social Determinants of health:    Amount and/or Complexity of Data Reviewed Labs: ordered. Radiology: ordered. ECG/medicine tests: ordered.   ***  {Document critical care time when appropriate:1} {Document review of labs and clinical decision tools ie heart score, Chads2Vasc2 etc:1}  {Document your independent review of radiology images, and any outside records:1} {Document your discussion with family members, caretakers, and with consultants:1} {Document social determinants of health affecting pt's care:1} {Document your decision making why or why  not admission, treatments were needed:1} Final Clinical Impression(s) / ED Diagnoses Final diagnoses:  None    Rx / DC Orders ED Discharge Orders     None

## 2022-06-29 DIAGNOSIS — S299XXA Unspecified injury of thorax, initial encounter: Secondary | ICD-10-CM | POA: Diagnosis not present

## 2022-06-29 DIAGNOSIS — Z7401 Bed confinement status: Secondary | ICD-10-CM | POA: Diagnosis not present

## 2022-06-29 DIAGNOSIS — M6281 Muscle weakness (generalized): Secondary | ICD-10-CM | POA: Diagnosis not present

## 2022-06-29 DIAGNOSIS — R29898 Other symptoms and signs involving the musculoskeletal system: Secondary | ICD-10-CM | POA: Diagnosis not present

## 2022-06-29 DIAGNOSIS — R531 Weakness: Secondary | ICD-10-CM | POA: Diagnosis not present

## 2022-06-29 DIAGNOSIS — Z9181 History of falling: Secondary | ICD-10-CM | POA: Diagnosis not present

## 2022-06-29 DIAGNOSIS — R262 Difficulty in walking, not elsewhere classified: Secondary | ICD-10-CM | POA: Diagnosis not present

## 2022-06-29 DIAGNOSIS — M25551 Pain in right hip: Secondary | ICD-10-CM | POA: Diagnosis not present

## 2022-06-29 LAB — I-STAT CHEM 8, ED
BUN: 19 mg/dL (ref 8–23)
Calcium, Ion: 1.17 mmol/L (ref 1.15–1.40)
Chloride: 103 mmol/L (ref 98–111)
Creatinine, Ser: 1 mg/dL (ref 0.61–1.24)
Glucose, Bld: 109 mg/dL — ABNORMAL HIGH (ref 70–99)
HCT: 39 % (ref 39.0–52.0)
Hemoglobin: 13.3 g/dL (ref 13.0–17.0)
Potassium: 4.1 mmol/L (ref 3.5–5.1)
Sodium: 138 mmol/L (ref 135–145)
TCO2: 24 mmol/L (ref 22–32)

## 2022-06-29 LAB — URINALYSIS, ROUTINE W REFLEX MICROSCOPIC
Bilirubin Urine: NEGATIVE
Glucose, UA: NEGATIVE mg/dL
Hgb urine dipstick: NEGATIVE
Ketones, ur: NEGATIVE mg/dL
Leukocytes,Ua: NEGATIVE
Nitrite: NEGATIVE
Protein, ur: NEGATIVE mg/dL
Specific Gravity, Urine: 1.013 (ref 1.005–1.030)
pH: 5 (ref 5.0–8.0)

## 2022-06-29 LAB — TROPONIN I (HIGH SENSITIVITY)
Troponin I (High Sensitivity): 32 ng/L — ABNORMAL HIGH (ref <18)
Troponin I (High Sensitivity): 57 ng/L — ABNORMAL HIGH (ref <18)
Troponin I (High Sensitivity): 53 ng/L — ABNORMAL HIGH (ref <18)

## 2022-06-29 NOTE — ED Notes (Signed)
Per Dr. Dina Rich c-collar can be removed. Removed c-collar at this time

## 2022-06-29 NOTE — ED Notes (Signed)
Patient resting in bed, no s/s of distress, denies any needs at this time, will continue to monitor.

## 2022-06-29 NOTE — ED Notes (Signed)
Pt resting quietly at this time with NAD noted, resp equal and non-labored. Side rails up x 2

## 2022-06-29 NOTE — Discharge Instructions (Addendum)
It was our pleasure to provide your ER care today - we hope that you feel better.  Fall precautions - use great care and assistance when up and about to minimize risk of falling.  Discuss with your doctor whether to continue your eliquis given your fall risk.   Follow up closely with primary care doctor in the next couple weeks.  Return to ER if worse, new symptoms, fevers, chest pain, trouble breathing, or other emergency concern.

## 2022-06-29 NOTE — ED Notes (Signed)
Called patients son per his request, and the patient was notified that his son was called.

## 2022-06-29 NOTE — ED Notes (Signed)
Pt ambulates with walker at baseline. Attempted to ambulate pt using a walker. Pt able to stand and take a few small steps toward the head of the bed. Pt notably weak, reports weakness seems worse than normal. Weeping noted from BLE, worse from R lower leg

## 2022-06-29 NOTE — ED Provider Notes (Signed)
Patient signed out pending lab work.  In brief suffered a mechanical fall prior to arrival.  He is on blood thinners.  Labs obtained and reviewed.  No evidence of UTI.  Lactate metabolic panel reassuring.  No leukocytosis.  He did have a slight elevation in his troponin.  He is not having any active chest pain.  Troponin was ordered based off of T wave inversions on EKG.  Initial troponin 32.  Second troponin 57.  Again, patient has not had any active chest pain.  I discussed this with the patient's son.  Reports that he would not act on any elevation of troponin in the setting given the patient's age.  Feel this is very reasonable.  See clinical course below.  Son would like to transition patient back to [facility].  He is making phone calls to see if this can happen.  If so, patient can go back by [facility]  Per nursing, patient with very tentative gait with walker. Physical Exam  BP (!) 190/84   Pulse (!) 37   Temp 97.6 F (36.4 C) (Oral)   Resp (!) 21   Ht 1.88 m ('6\' 2"'$ )   Wt 77.1 kg   SpO2 98%   BMI 21.83 kg/m   Physical Exam  Procedures  Procedures  ED Course / MDM   Clinical Course as of 06/29/22 0732  Wed Jun 28, 2022  2337 Patient signed out to Dr. Dina Rich pending labs. [VK]  Thu Jun 29, 2022  0627 Patient's son is at the bedside.  Patient had difficulty ambulating.  Patient and his son are most interested in trying to get him back to [facility].  Son will inquire about escalating his care there.  I did inform him of the increased troponin.  Repeat is pending.  Son reports that they would not likely act on any positive testing given the patient's age.  He is not having any ongoing chest pain.  Son reports general decline over the last 3 months with multiple falls. [CH]    Clinical Course User Index [CH] Ravin Denardo, Barbette Hair, MD [VK] Kemper Durie, DO   Medical Decision Making Amount and/or Complexity of Data Reviewed Labs: ordered. Radiology: ordered. ECG/medicine tests:  ordered.   Problem List Items Addressed This Visit   None Visit Diagnoses     Generalized weakness    -  Primary   Gait instability                 Merryl Hacker, MD 06/29/22 [phone]

## 2022-06-29 NOTE — ED Provider Notes (Signed)
Signed out that ED workup completed - that pt/family do not want admission and/or further hospital testing, but do want to return to current ECF with expanded services.    TOC/SW consulted - they have spoken with family, and rec d/c with additional of [facility] and OT - order placed.      Lajean Saver, MD 06/29/22 1011

## 2022-06-29 NOTE — ED Notes (Signed)
Per Dr. Dina Rich trop labs didn't need to be drawn at this time

## 2022-06-29 NOTE — Progress Notes (Signed)
CSW spoke with patient's son Ival who is agreeable for his father to return to [facility] with home health services - OT and PT.  CSW spoke with Rollene Fare at [facility] who requested home health orders be faxed directly to the facility and she will obtain services.  CSW faxed orders to [phone].  Patient will need to be transported via [facility] back to [facility] - RN to call when ready.  Madilyn Fireman, MSW, LCSW Transitions of Care  Clinical Social Worker II [phone]

## 2022-06-30 DIAGNOSIS — M6281 Muscle weakness (generalized): Secondary | ICD-10-CM | POA: Diagnosis not present

## 2022-06-30 DIAGNOSIS — R262 Difficulty in walking, not elsewhere classified: Secondary | ICD-10-CM | POA: Diagnosis not present

## 2022-06-30 DIAGNOSIS — Z9181 History of falling: Secondary | ICD-10-CM | POA: Diagnosis not present

## 2022-06-30 DIAGNOSIS — M25551 Pain in right hip: Secondary | ICD-10-CM | POA: Diagnosis not present

## 2022-07-03 ENCOUNTER — Ambulatory Visit: Payer: Medicare Other | Admitting: Interventional Cardiology

## 2022-07-03 DIAGNOSIS — R262 Difficulty in walking, not elsewhere classified: Secondary | ICD-10-CM | POA: Diagnosis not present

## 2022-07-03 DIAGNOSIS — M6281 Muscle weakness (generalized): Secondary | ICD-10-CM | POA: Diagnosis not present

## 2022-07-03 DIAGNOSIS — M25551 Pain in right hip: Secondary | ICD-10-CM | POA: Diagnosis not present

## 2022-07-03 DIAGNOSIS — Z9181 History of falling: Secondary | ICD-10-CM | POA: Diagnosis not present

## 2022-07-04 DIAGNOSIS — M25551 Pain in right hip: Secondary | ICD-10-CM | POA: Diagnosis not present

## 2022-07-04 DIAGNOSIS — Z9181 History of falling: Secondary | ICD-10-CM | POA: Diagnosis not present

## 2022-07-04 DIAGNOSIS — R262 Difficulty in walking, not elsewhere classified: Secondary | ICD-10-CM | POA: Diagnosis not present

## 2022-07-04 DIAGNOSIS — M6281 Muscle weakness (generalized): Secondary | ICD-10-CM | POA: Diagnosis not present

## 2022-07-05 ENCOUNTER — Encounter: Payer: Self-pay | Admitting: Interventional Cardiology

## 2022-07-05 DIAGNOSIS — M6281 Muscle weakness (generalized): Secondary | ICD-10-CM | POA: Diagnosis not present

## 2022-07-05 DIAGNOSIS — M25551 Pain in right hip: Secondary | ICD-10-CM | POA: Diagnosis not present

## 2022-07-05 DIAGNOSIS — R262 Difficulty in walking, not elsewhere classified: Secondary | ICD-10-CM | POA: Diagnosis not present

## 2022-07-05 DIAGNOSIS — Z9181 History of falling: Secondary | ICD-10-CM | POA: Diagnosis not present

## 2022-07-06 ENCOUNTER — Ambulatory Visit: Payer: Medicare Other | Admitting: Podiatry

## 2022-07-06 ENCOUNTER — Ambulatory Visit: Payer: Medicare Other | Admitting: Interventional Cardiology

## 2022-07-06 DIAGNOSIS — Z9181 History of falling: Secondary | ICD-10-CM | POA: Diagnosis not present

## 2022-07-06 DIAGNOSIS — M25551 Pain in right hip: Secondary | ICD-10-CM | POA: Diagnosis not present

## 2022-07-06 DIAGNOSIS — M6281 Muscle weakness (generalized): Secondary | ICD-10-CM | POA: Diagnosis not present

## 2022-07-06 DIAGNOSIS — R262 Difficulty in walking, not elsewhere classified: Secondary | ICD-10-CM | POA: Diagnosis not present

## 2022-07-07 DIAGNOSIS — Z9181 History of falling: Secondary | ICD-10-CM | POA: Diagnosis not present

## 2022-07-07 DIAGNOSIS — M6281 Muscle weakness (generalized): Secondary | ICD-10-CM | POA: Diagnosis not present

## 2022-07-07 DIAGNOSIS — M25551 Pain in right hip: Secondary | ICD-10-CM | POA: Diagnosis not present

## 2022-07-07 DIAGNOSIS — R262 Difficulty in walking, not elsewhere classified: Secondary | ICD-10-CM | POA: Diagnosis not present

## 2022-07-10 ENCOUNTER — Ambulatory Visit (INDEPENDENT_AMBULATORY_CARE_PROVIDER_SITE_OTHER): Payer: Medicare Other | Admitting: Podiatry

## 2022-07-10 ENCOUNTER — Encounter: Payer: Self-pay | Admitting: Podiatry

## 2022-07-10 DIAGNOSIS — M6281 Muscle weakness (generalized): Secondary | ICD-10-CM | POA: Diagnosis not present

## 2022-07-10 DIAGNOSIS — M79674 Pain in right toe(s): Secondary | ICD-10-CM

## 2022-07-10 DIAGNOSIS — M25551 Pain in right hip: Secondary | ICD-10-CM | POA: Diagnosis not present

## 2022-07-10 DIAGNOSIS — Z9181 History of falling: Secondary | ICD-10-CM | POA: Diagnosis not present

## 2022-07-10 DIAGNOSIS — M79675 Pain in left toe(s): Secondary | ICD-10-CM | POA: Diagnosis not present

## 2022-07-10 DIAGNOSIS — B351 Tinea unguium: Secondary | ICD-10-CM

## 2022-07-10 DIAGNOSIS — R262 Difficulty in walking, not elsewhere classified: Secondary | ICD-10-CM | POA: Diagnosis not present

## 2022-07-11 DIAGNOSIS — M25551 Pain in right hip: Secondary | ICD-10-CM | POA: Diagnosis not present

## 2022-07-11 DIAGNOSIS — M6281 Muscle weakness (generalized): Secondary | ICD-10-CM | POA: Diagnosis not present

## 2022-07-11 DIAGNOSIS — Z9181 History of falling: Secondary | ICD-10-CM | POA: Diagnosis not present

## 2022-07-11 DIAGNOSIS — R262 Difficulty in walking, not elsewhere classified: Secondary | ICD-10-CM | POA: Diagnosis not present

## 2022-07-11 NOTE — Progress Notes (Signed)
Subjective:   Patient ID: Justin Potter, male   DOB: 88 y.o.   MRN: [id number]   HPI Patient presents stating nails of elongated and thickened and they can be bothersome for him and he presents with caregiver they cannot take care of themselves   ROS      Objective:  Physical Exam  Neurovascular status intact thick yellow brittle nailbeds 1-5 both feet painful     Assessment:  Chronic mycotic painful nailbeds 1-5 both feet     Plan:  Debridement nailbeds 1-5 both feet neurogenic bleeding reappoint routine care

## 2022-07-13 DIAGNOSIS — M25551 Pain in right hip: Secondary | ICD-10-CM | POA: Diagnosis not present

## 2022-07-13 DIAGNOSIS — M6281 Muscle weakness (generalized): Secondary | ICD-10-CM | POA: Diagnosis not present

## 2022-07-13 DIAGNOSIS — R262 Difficulty in walking, not elsewhere classified: Secondary | ICD-10-CM | POA: Diagnosis not present

## 2022-07-13 DIAGNOSIS — Z9181 History of falling: Secondary | ICD-10-CM | POA: Diagnosis not present

## 2022-07-14 DIAGNOSIS — Z9181 History of falling: Secondary | ICD-10-CM | POA: Diagnosis not present

## 2022-07-14 DIAGNOSIS — M25551 Pain in right hip: Secondary | ICD-10-CM | POA: Diagnosis not present

## 2022-07-14 DIAGNOSIS — M6281 Muscle weakness (generalized): Secondary | ICD-10-CM | POA: Diagnosis not present

## 2022-07-14 DIAGNOSIS — R262 Difficulty in walking, not elsewhere classified: Secondary | ICD-10-CM | POA: Diagnosis not present

## 2022-07-18 DIAGNOSIS — Z9181 History of falling: Secondary | ICD-10-CM | POA: Diagnosis not present

## 2022-07-18 DIAGNOSIS — R262 Difficulty in walking, not elsewhere classified: Secondary | ICD-10-CM | POA: Diagnosis not present

## 2022-07-18 DIAGNOSIS — M25551 Pain in right hip: Secondary | ICD-10-CM | POA: Diagnosis not present

## 2022-07-18 DIAGNOSIS — M6281 Muscle weakness (generalized): Secondary | ICD-10-CM | POA: Diagnosis not present

## 2022-07-19 DIAGNOSIS — Z9181 History of falling: Secondary | ICD-10-CM | POA: Diagnosis not present

## 2022-07-19 DIAGNOSIS — M6281 Muscle weakness (generalized): Secondary | ICD-10-CM | POA: Diagnosis not present

## 2022-07-19 DIAGNOSIS — R262 Difficulty in walking, not elsewhere classified: Secondary | ICD-10-CM | POA: Diagnosis not present

## 2022-07-19 DIAGNOSIS — M25551 Pain in right hip: Secondary | ICD-10-CM | POA: Diagnosis not present

## 2022-07-20 DIAGNOSIS — R262 Difficulty in walking, not elsewhere classified: Secondary | ICD-10-CM | POA: Diagnosis not present

## 2022-07-20 DIAGNOSIS — R600 Localized edema: Secondary | ICD-10-CM | POA: Diagnosis not present

## 2022-07-20 DIAGNOSIS — M6281 Muscle weakness (generalized): Secondary | ICD-10-CM | POA: Diagnosis not present

## 2022-07-20 DIAGNOSIS — Z9181 History of falling: Secondary | ICD-10-CM | POA: Diagnosis not present

## 2022-07-20 DIAGNOSIS — R269 Unspecified abnormalities of gait and mobility: Secondary | ICD-10-CM | POA: Diagnosis not present

## 2022-07-20 DIAGNOSIS — R001 Bradycardia, unspecified: Secondary | ICD-10-CM | POA: Diagnosis not present

## 2022-07-20 DIAGNOSIS — R531 Weakness: Secondary | ICD-10-CM | POA: Diagnosis not present

## 2022-07-20 DIAGNOSIS — M25551 Pain in right hip: Secondary | ICD-10-CM | POA: Diagnosis not present

## 2022-07-21 DIAGNOSIS — M25551 Pain in right hip: Secondary | ICD-10-CM | POA: Diagnosis not present

## 2022-07-21 DIAGNOSIS — M6281 Muscle weakness (generalized): Secondary | ICD-10-CM | POA: Diagnosis not present

## 2022-07-21 DIAGNOSIS — R262 Difficulty in walking, not elsewhere classified: Secondary | ICD-10-CM | POA: Diagnosis not present

## 2022-07-21 DIAGNOSIS — Z9181 History of falling: Secondary | ICD-10-CM | POA: Diagnosis not present

## 2022-07-25 DIAGNOSIS — Z9181 History of falling: Secondary | ICD-10-CM | POA: Diagnosis not present

## 2022-07-25 DIAGNOSIS — M25551 Pain in right hip: Secondary | ICD-10-CM | POA: Diagnosis not present

## 2022-07-25 DIAGNOSIS — M6281 Muscle weakness (generalized): Secondary | ICD-10-CM | POA: Diagnosis not present

## 2022-07-25 DIAGNOSIS — R262 Difficulty in walking, not elsewhere classified: Secondary | ICD-10-CM | POA: Diagnosis not present

## 2022-07-26 DIAGNOSIS — Z9181 History of falling: Secondary | ICD-10-CM | POA: Diagnosis not present

## 2022-07-26 DIAGNOSIS — R262 Difficulty in walking, not elsewhere classified: Secondary | ICD-10-CM | POA: Diagnosis not present

## 2022-07-26 DIAGNOSIS — M6281 Muscle weakness (generalized): Secondary | ICD-10-CM | POA: Diagnosis not present

## 2022-07-26 DIAGNOSIS — M25551 Pain in right hip: Secondary | ICD-10-CM | POA: Diagnosis not present

## 2022-07-27 DIAGNOSIS — Z9181 History of falling: Secondary | ICD-10-CM | POA: Diagnosis not present

## 2022-07-27 DIAGNOSIS — M25551 Pain in right hip: Secondary | ICD-10-CM | POA: Diagnosis not present

## 2022-07-27 DIAGNOSIS — M6281 Muscle weakness (generalized): Secondary | ICD-10-CM | POA: Diagnosis not present

## 2022-07-27 DIAGNOSIS — R262 Difficulty in walking, not elsewhere classified: Secondary | ICD-10-CM | POA: Diagnosis not present

## 2022-07-28 DIAGNOSIS — Z9181 History of falling: Secondary | ICD-10-CM | POA: Diagnosis not present

## 2022-07-28 DIAGNOSIS — M6281 Muscle weakness (generalized): Secondary | ICD-10-CM | POA: Diagnosis not present

## 2022-07-28 DIAGNOSIS — R262 Difficulty in walking, not elsewhere classified: Secondary | ICD-10-CM | POA: Diagnosis not present

## 2022-07-28 DIAGNOSIS — M25551 Pain in right hip: Secondary | ICD-10-CM | POA: Diagnosis not present

## 2022-07-31 DIAGNOSIS — Z9181 History of falling: Secondary | ICD-10-CM | POA: Diagnosis not present

## 2022-07-31 DIAGNOSIS — M6281 Muscle weakness (generalized): Secondary | ICD-10-CM | POA: Diagnosis not present

## 2022-07-31 DIAGNOSIS — R262 Difficulty in walking, not elsewhere classified: Secondary | ICD-10-CM | POA: Diagnosis not present

## 2022-07-31 DIAGNOSIS — M25551 Pain in right hip: Secondary | ICD-10-CM | POA: Diagnosis not present

## 2022-08-01 DIAGNOSIS — Z9181 History of falling: Secondary | ICD-10-CM | POA: Diagnosis not present

## 2022-08-01 DIAGNOSIS — M25551 Pain in right hip: Secondary | ICD-10-CM | POA: Diagnosis not present

## 2022-08-01 DIAGNOSIS — R262 Difficulty in walking, not elsewhere classified: Secondary | ICD-10-CM | POA: Diagnosis not present

## 2022-08-01 DIAGNOSIS — M6281 Muscle weakness (generalized): Secondary | ICD-10-CM | POA: Diagnosis not present

## 2022-08-02 DIAGNOSIS — R262 Difficulty in walking, not elsewhere classified: Secondary | ICD-10-CM | POA: Diagnosis not present

## 2022-08-02 DIAGNOSIS — M25551 Pain in right hip: Secondary | ICD-10-CM | POA: Diagnosis not present

## 2022-08-02 DIAGNOSIS — Z9181 History of falling: Secondary | ICD-10-CM | POA: Diagnosis not present

## 2022-08-02 DIAGNOSIS — M6281 Muscle weakness (generalized): Secondary | ICD-10-CM | POA: Diagnosis not present

## 2022-08-03 DIAGNOSIS — Z9181 History of falling: Secondary | ICD-10-CM | POA: Diagnosis not present

## 2022-08-03 DIAGNOSIS — R262 Difficulty in walking, not elsewhere classified: Secondary | ICD-10-CM | POA: Diagnosis not present

## 2022-08-03 DIAGNOSIS — M6281 Muscle weakness (generalized): Secondary | ICD-10-CM | POA: Diagnosis not present

## 2022-08-03 DIAGNOSIS — M25551 Pain in right hip: Secondary | ICD-10-CM | POA: Diagnosis not present

## 2022-08-04 DIAGNOSIS — R262 Difficulty in walking, not elsewhere classified: Secondary | ICD-10-CM | POA: Diagnosis not present

## 2022-08-04 DIAGNOSIS — M6281 Muscle weakness (generalized): Secondary | ICD-10-CM | POA: Diagnosis not present

## 2022-08-04 DIAGNOSIS — M25551 Pain in right hip: Secondary | ICD-10-CM | POA: Diagnosis not present

## 2022-08-04 DIAGNOSIS — Z9181 History of falling: Secondary | ICD-10-CM | POA: Diagnosis not present

## 2022-08-07 DIAGNOSIS — Z9181 History of falling: Secondary | ICD-10-CM | POA: Diagnosis not present

## 2022-08-07 DIAGNOSIS — R262 Difficulty in walking, not elsewhere classified: Secondary | ICD-10-CM | POA: Diagnosis not present

## 2022-08-07 DIAGNOSIS — M6281 Muscle weakness (generalized): Secondary | ICD-10-CM | POA: Diagnosis not present

## 2022-08-07 DIAGNOSIS — M25551 Pain in right hip: Secondary | ICD-10-CM | POA: Diagnosis not present

## 2022-08-08 DIAGNOSIS — R262 Difficulty in walking, not elsewhere classified: Secondary | ICD-10-CM | POA: Diagnosis not present

## 2022-08-08 DIAGNOSIS — Z9181 History of falling: Secondary | ICD-10-CM | POA: Diagnosis not present

## 2022-08-08 DIAGNOSIS — M6281 Muscle weakness (generalized): Secondary | ICD-10-CM | POA: Diagnosis not present

## 2022-08-08 DIAGNOSIS — M25551 Pain in right hip: Secondary | ICD-10-CM | POA: Diagnosis not present

## 2022-08-10 DIAGNOSIS — M25551 Pain in right hip: Secondary | ICD-10-CM | POA: Diagnosis not present

## 2022-08-10 DIAGNOSIS — R262 Difficulty in walking, not elsewhere classified: Secondary | ICD-10-CM | POA: Diagnosis not present

## 2022-08-10 DIAGNOSIS — I4891 Unspecified atrial fibrillation: Secondary | ICD-10-CM | POA: Diagnosis not present

## 2022-08-10 DIAGNOSIS — M6281 Muscle weakness (generalized): Secondary | ICD-10-CM | POA: Diagnosis not present

## 2022-08-10 DIAGNOSIS — H3530 Unspecified macular degeneration: Secondary | ICD-10-CM | POA: Diagnosis not present

## 2022-08-10 DIAGNOSIS — R001 Bradycardia, unspecified: Secondary | ICD-10-CM | POA: Diagnosis not present

## 2022-08-10 DIAGNOSIS — M109 Gout, unspecified: Secondary | ICD-10-CM | POA: Diagnosis not present

## 2022-08-10 DIAGNOSIS — I10 Essential (primary) hypertension: Secondary | ICD-10-CM | POA: Diagnosis not present

## 2022-08-10 DIAGNOSIS — K224 Dyskinesia of esophagus: Secondary | ICD-10-CM | POA: Diagnosis not present

## 2022-08-10 DIAGNOSIS — G459 Transient cerebral ischemic attack, unspecified: Secondary | ICD-10-CM | POA: Diagnosis not present

## 2022-08-10 DIAGNOSIS — Z8546 Personal history of malignant neoplasm of prostate: Secondary | ICD-10-CM | POA: Diagnosis not present

## 2022-08-10 DIAGNOSIS — D6869 Other thrombophilia: Secondary | ICD-10-CM | POA: Diagnosis not present

## 2022-08-10 DIAGNOSIS — E7800 Pure hypercholesterolemia, unspecified: Secondary | ICD-10-CM | POA: Diagnosis not present

## 2022-08-10 DIAGNOSIS — H548 Legal blindness, as defined in USA: Secondary | ICD-10-CM | POA: Diagnosis not present

## 2022-08-10 DIAGNOSIS — Z9181 History of falling: Secondary | ICD-10-CM | POA: Diagnosis not present

## 2022-08-10 DIAGNOSIS — R269 Unspecified abnormalities of gait and mobility: Secondary | ICD-10-CM | POA: Diagnosis not present

## 2022-08-10 DIAGNOSIS — Z0000 Encounter for general adult medical examination without abnormal findings: Secondary | ICD-10-CM | POA: Diagnosis not present

## 2022-08-10 DIAGNOSIS — Z1331 Encounter for screening for depression: Secondary | ICD-10-CM | POA: Diagnosis not present

## 2022-08-11 DIAGNOSIS — R262 Difficulty in walking, not elsewhere classified: Secondary | ICD-10-CM | POA: Diagnosis not present

## 2022-08-11 DIAGNOSIS — M25551 Pain in right hip: Secondary | ICD-10-CM | POA: Diagnosis not present

## 2022-08-11 DIAGNOSIS — Z9181 History of falling: Secondary | ICD-10-CM | POA: Diagnosis not present

## 2022-08-11 DIAGNOSIS — M6281 Muscle weakness (generalized): Secondary | ICD-10-CM | POA: Diagnosis not present

## 2022-08-14 DIAGNOSIS — Z9181 History of falling: Secondary | ICD-10-CM | POA: Diagnosis not present

## 2022-08-14 DIAGNOSIS — M25551 Pain in right hip: Secondary | ICD-10-CM | POA: Diagnosis not present

## 2022-08-14 DIAGNOSIS — M6281 Muscle weakness (generalized): Secondary | ICD-10-CM | POA: Diagnosis not present

## 2022-08-14 DIAGNOSIS — R262 Difficulty in walking, not elsewhere classified: Secondary | ICD-10-CM | POA: Diagnosis not present

## 2022-08-15 DIAGNOSIS — R262 Difficulty in walking, not elsewhere classified: Secondary | ICD-10-CM | POA: Diagnosis not present

## 2022-08-15 DIAGNOSIS — M25551 Pain in right hip: Secondary | ICD-10-CM | POA: Diagnosis not present

## 2022-08-15 DIAGNOSIS — M6281 Muscle weakness (generalized): Secondary | ICD-10-CM | POA: Diagnosis not present

## 2022-08-15 DIAGNOSIS — Z9181 History of falling: Secondary | ICD-10-CM | POA: Diagnosis not present

## 2022-08-17 DIAGNOSIS — M25551 Pain in right hip: Secondary | ICD-10-CM | POA: Diagnosis not present

## 2022-08-17 DIAGNOSIS — M6281 Muscle weakness (generalized): Secondary | ICD-10-CM | POA: Diagnosis not present

## 2022-08-17 DIAGNOSIS — Z9181 History of falling: Secondary | ICD-10-CM | POA: Diagnosis not present

## 2022-08-17 DIAGNOSIS — R262 Difficulty in walking, not elsewhere classified: Secondary | ICD-10-CM | POA: Diagnosis not present

## 2022-08-18 DIAGNOSIS — M25551 Pain in right hip: Secondary | ICD-10-CM | POA: Diagnosis not present

## 2022-08-18 DIAGNOSIS — Z9181 History of falling: Secondary | ICD-10-CM | POA: Diagnosis not present

## 2022-08-18 DIAGNOSIS — R262 Difficulty in walking, not elsewhere classified: Secondary | ICD-10-CM | POA: Diagnosis not present

## 2022-08-18 DIAGNOSIS — M6281 Muscle weakness (generalized): Secondary | ICD-10-CM | POA: Diagnosis not present

## 2022-08-21 DIAGNOSIS — R262 Difficulty in walking, not elsewhere classified: Secondary | ICD-10-CM | POA: Diagnosis not present

## 2022-08-21 DIAGNOSIS — M25551 Pain in right hip: Secondary | ICD-10-CM | POA: Diagnosis not present

## 2022-08-21 DIAGNOSIS — M6281 Muscle weakness (generalized): Secondary | ICD-10-CM | POA: Diagnosis not present

## 2022-08-21 DIAGNOSIS — Z9181 History of falling: Secondary | ICD-10-CM | POA: Diagnosis not present

## 2022-08-22 DIAGNOSIS — Z9181 History of falling: Secondary | ICD-10-CM | POA: Diagnosis not present

## 2022-08-22 DIAGNOSIS — R262 Difficulty in walking, not elsewhere classified: Secondary | ICD-10-CM | POA: Diagnosis not present

## 2022-08-22 DIAGNOSIS — M6281 Muscle weakness (generalized): Secondary | ICD-10-CM | POA: Diagnosis not present

## 2022-08-22 DIAGNOSIS — M25551 Pain in right hip: Secondary | ICD-10-CM | POA: Diagnosis not present

## 2022-08-24 DIAGNOSIS — R262 Difficulty in walking, not elsewhere classified: Secondary | ICD-10-CM | POA: Diagnosis not present

## 2022-08-24 DIAGNOSIS — M25551 Pain in right hip: Secondary | ICD-10-CM | POA: Diagnosis not present

## 2022-08-24 DIAGNOSIS — M6281 Muscle weakness (generalized): Secondary | ICD-10-CM | POA: Diagnosis not present

## 2022-08-24 DIAGNOSIS — Z9181 History of falling: Secondary | ICD-10-CM | POA: Diagnosis not present

## 2022-08-28 DIAGNOSIS — Z9181 History of falling: Secondary | ICD-10-CM | POA: Diagnosis not present

## 2022-08-28 DIAGNOSIS — M6281 Muscle weakness (generalized): Secondary | ICD-10-CM | POA: Diagnosis not present

## 2022-08-28 DIAGNOSIS — M25551 Pain in right hip: Secondary | ICD-10-CM | POA: Diagnosis not present

## 2022-08-28 DIAGNOSIS — R262 Difficulty in walking, not elsewhere classified: Secondary | ICD-10-CM | POA: Diagnosis not present

## 2022-08-29 DIAGNOSIS — R262 Difficulty in walking, not elsewhere classified: Secondary | ICD-10-CM | POA: Diagnosis not present

## 2022-08-29 DIAGNOSIS — M25551 Pain in right hip: Secondary | ICD-10-CM | POA: Diagnosis not present

## 2022-08-29 DIAGNOSIS — Z9181 History of falling: Secondary | ICD-10-CM | POA: Diagnosis not present

## 2022-08-29 DIAGNOSIS — M6281 Muscle weakness (generalized): Secondary | ICD-10-CM | POA: Diagnosis not present

## 2022-08-30 ENCOUNTER — Emergency Department (HOSPITAL_COMMUNITY): Payer: Medicare Other

## 2022-08-30 ENCOUNTER — Inpatient Hospital Stay (HOSPITAL_COMMUNITY)
Admission: EM | Admit: 2022-08-30 | Discharge: 2022-09-06 | DRG: 552 | Disposition: A | Discharge status: Rehabilitation Facility | Payer: Medicare Other | Source: Skilled Nursing Facility | Attending: Internal Medicine | Admitting: Internal Medicine

## 2022-08-30 DIAGNOSIS — M19042 Primary osteoarthritis, left hand: Secondary | ICD-10-CM | POA: Diagnosis present

## 2022-08-30 DIAGNOSIS — I4821 Permanent atrial fibrillation: Secondary | ICD-10-CM | POA: Diagnosis present

## 2022-08-30 DIAGNOSIS — I10 Essential (primary) hypertension: Secondary | ICD-10-CM | POA: Diagnosis present

## 2022-08-30 DIAGNOSIS — S22051A Stable burst fracture of T5-T6 vertebra, initial encounter for closed fracture: Secondary | ICD-10-CM | POA: Diagnosis present

## 2022-08-30 DIAGNOSIS — R296 Repeated falls: Secondary | ICD-10-CM | POA: Diagnosis not present

## 2022-08-30 DIAGNOSIS — Z66 Do not resuscitate: Secondary | ICD-10-CM | POA: Diagnosis present

## 2022-08-30 DIAGNOSIS — D62 Acute posthemorrhagic anemia: Secondary | ICD-10-CM | POA: Diagnosis present

## 2022-08-30 DIAGNOSIS — S300XXA Contusion of lower back and pelvis, initial encounter: Secondary | ICD-10-CM | POA: Diagnosis present

## 2022-08-30 DIAGNOSIS — R410 Disorientation, unspecified: Secondary | ICD-10-CM | POA: Diagnosis not present

## 2022-08-30 DIAGNOSIS — S22039A Unspecified fracture of third thoracic vertebra, initial encounter for closed fracture: Secondary | ICD-10-CM | POA: Diagnosis not present

## 2022-08-30 DIAGNOSIS — M8588 Other specified disorders of bone density and structure, other site: Secondary | ICD-10-CM | POA: Diagnosis not present

## 2022-08-30 DIAGNOSIS — H3530 Unspecified macular degeneration: Secondary | ICD-10-CM | POA: Diagnosis present

## 2022-08-30 DIAGNOSIS — I491 Atrial premature depolarization: Secondary | ICD-10-CM | POA: Diagnosis not present

## 2022-08-30 DIAGNOSIS — D72829 Elevated white blood cell count, unspecified: Secondary | ICD-10-CM | POA: Diagnosis not present

## 2022-08-30 DIAGNOSIS — Y92099 Unspecified place in other non-institutional residence as the place of occurrence of the external cause: Secondary | ICD-10-CM

## 2022-08-30 DIAGNOSIS — Z8546 Personal history of malignant neoplasm of prostate: Secondary | ICD-10-CM | POA: Diagnosis not present

## 2022-08-30 DIAGNOSIS — S22050A Wedge compression fracture of T5-T6 vertebra, initial encounter for closed fracture: Secondary | ICD-10-CM | POA: Diagnosis not present

## 2022-08-30 DIAGNOSIS — R7989 Other specified abnormal findings of blood chemistry: Secondary | ICD-10-CM | POA: Diagnosis not present

## 2022-08-30 DIAGNOSIS — R52 Pain, unspecified: Secondary | ICD-10-CM | POA: Diagnosis not present

## 2022-08-30 DIAGNOSIS — S0003XA Contusion of scalp, initial encounter: Secondary | ICD-10-CM | POA: Diagnosis present

## 2022-08-30 DIAGNOSIS — Z85828 Personal history of other malignant neoplasm of skin: Secondary | ICD-10-CM | POA: Diagnosis not present

## 2022-08-30 DIAGNOSIS — R5381 Other malaise: Secondary | ICD-10-CM | POA: Diagnosis present

## 2022-08-30 DIAGNOSIS — F419 Anxiety disorder, unspecified: Secondary | ICD-10-CM | POA: Diagnosis present

## 2022-08-30 DIAGNOSIS — T148XXA Other injury of unspecified body region, initial encounter: Secondary | ICD-10-CM | POA: Diagnosis not present

## 2022-08-30 DIAGNOSIS — Z7901 Long term (current) use of anticoagulants: Secondary | ICD-10-CM | POA: Diagnosis not present

## 2022-08-30 DIAGNOSIS — R000 Tachycardia, unspecified: Secondary | ICD-10-CM | POA: Diagnosis not present

## 2022-08-30 DIAGNOSIS — R102 Pelvic and perineal pain: Secondary | ICD-10-CM | POA: Diagnosis not present

## 2022-08-30 DIAGNOSIS — M19041 Primary osteoarthritis, right hand: Secondary | ICD-10-CM | POA: Diagnosis present

## 2022-08-30 DIAGNOSIS — S22058A Other fracture of T5-T6 vertebra, initial encounter for closed fracture: Secondary | ICD-10-CM | POA: Diagnosis not present

## 2022-08-30 DIAGNOSIS — Z9842 Cataract extraction status, left eye: Secondary | ICD-10-CM

## 2022-08-30 DIAGNOSIS — N390 Urinary tract infection, site not specified: Secondary | ICD-10-CM | POA: Diagnosis present

## 2022-08-30 DIAGNOSIS — Z961 Presence of intraocular lens: Secondary | ICD-10-CM | POA: Diagnosis present

## 2022-08-30 DIAGNOSIS — S22059D Unspecified fracture of T5-T6 vertebra, subsequent encounter for fracture with routine healing: Secondary | ICD-10-CM | POA: Diagnosis present

## 2022-08-30 DIAGNOSIS — F05 Delirium due to known physiological condition: Secondary | ICD-10-CM | POA: Diagnosis not present

## 2022-08-30 DIAGNOSIS — A499 Bacterial infection, unspecified: Secondary | ICD-10-CM | POA: Diagnosis not present

## 2022-08-30 DIAGNOSIS — R079 Chest pain, unspecified: Secondary | ICD-10-CM | POA: Diagnosis not present

## 2022-08-30 DIAGNOSIS — M1611 Unilateral primary osteoarthritis, right hip: Secondary | ICD-10-CM | POA: Diagnosis not present

## 2022-08-30 DIAGNOSIS — W1830XA Fall on same level, unspecified, initial encounter: Secondary | ICD-10-CM | POA: Diagnosis present

## 2022-08-30 DIAGNOSIS — I4819 Other persistent atrial fibrillation: Secondary | ICD-10-CM | POA: Diagnosis present

## 2022-08-30 DIAGNOSIS — Z809 Family history of malignant neoplasm, unspecified: Secondary | ICD-10-CM | POA: Diagnosis not present

## 2022-08-30 DIAGNOSIS — E860 Dehydration: Secondary | ICD-10-CM | POA: Diagnosis present

## 2022-08-30 DIAGNOSIS — S22009S Unspecified fracture of unspecified thoracic vertebra, sequela: Secondary | ICD-10-CM | POA: Diagnosis not present

## 2022-08-30 DIAGNOSIS — S4992XA Unspecified injury of left shoulder and upper arm, initial encounter: Secondary | ICD-10-CM | POA: Diagnosis not present

## 2022-08-30 DIAGNOSIS — Z96651 Presence of right artificial knee joint: Secondary | ICD-10-CM | POA: Diagnosis present

## 2022-08-30 DIAGNOSIS — Z79899 Other long term (current) drug therapy: Secondary | ICD-10-CM | POA: Diagnosis not present

## 2022-08-30 DIAGNOSIS — S22059A Unspecified fracture of T5-T6 vertebra, initial encounter for closed fracture: Secondary | ICD-10-CM

## 2022-08-30 DIAGNOSIS — Z9841 Cataract extraction status, right eye: Secondary | ICD-10-CM | POA: Diagnosis not present

## 2022-08-30 DIAGNOSIS — E785 Hyperlipidemia, unspecified: Secondary | ICD-10-CM | POA: Diagnosis present

## 2022-08-30 DIAGNOSIS — Z9049 Acquired absence of other specified parts of digestive tract: Secondary | ICD-10-CM | POA: Diagnosis not present

## 2022-08-30 DIAGNOSIS — Z87891 Personal history of nicotine dependence: Secondary | ICD-10-CM

## 2022-08-30 DIAGNOSIS — K5900 Constipation, unspecified: Secondary | ICD-10-CM | POA: Diagnosis not present

## 2022-08-30 DIAGNOSIS — S22000A Wedge compression fracture of unspecified thoracic vertebra, initial encounter for closed fracture: Secondary | ICD-10-CM | POA: Diagnosis present

## 2022-08-30 DIAGNOSIS — R443 Hallucinations, unspecified: Secondary | ICD-10-CM | POA: Diagnosis not present

## 2022-08-30 DIAGNOSIS — Z8673 Personal history of transient ischemic attack (TIA), and cerebral infarction without residual deficits: Secondary | ICD-10-CM | POA: Diagnosis not present

## 2022-08-30 DIAGNOSIS — M25551 Pain in right hip: Secondary | ICD-10-CM | POA: Diagnosis not present

## 2022-08-30 DIAGNOSIS — W19XXXD Unspecified fall, subsequent encounter: Secondary | ICD-10-CM | POA: Diagnosis present

## 2022-08-30 DIAGNOSIS — Z9181 History of falling: Secondary | ICD-10-CM

## 2022-08-30 DIAGNOSIS — R471 Dysarthria and anarthria: Secondary | ICD-10-CM | POA: Diagnosis present

## 2022-08-30 DIAGNOSIS — D649 Anemia, unspecified: Secondary | ICD-10-CM

## 2022-08-30 DIAGNOSIS — S22009A Unspecified fracture of unspecified thoracic vertebra, initial encounter for closed fracture: Secondary | ICD-10-CM | POA: Diagnosis present

## 2022-08-30 DIAGNOSIS — N320 Bladder-neck obstruction: Secondary | ICD-10-CM | POA: Diagnosis present

## 2022-08-30 DIAGNOSIS — S22031A Stable burst fracture of third thoracic vertebra, initial encounter for closed fracture: Principal | ICD-10-CM | POA: Diagnosis present

## 2022-08-30 DIAGNOSIS — N179 Acute kidney failure, unspecified: Secondary | ICD-10-CM | POA: Diagnosis not present

## 2022-08-30 DIAGNOSIS — I4891 Unspecified atrial fibrillation: Secondary | ICD-10-CM | POA: Diagnosis not present

## 2022-08-30 DIAGNOSIS — M6281 Muscle weakness (generalized): Secondary | ICD-10-CM | POA: Diagnosis not present

## 2022-08-30 DIAGNOSIS — R262 Difficulty in walking, not elsewhere classified: Secondary | ICD-10-CM | POA: Diagnosis not present

## 2022-08-30 DIAGNOSIS — M19012 Primary osteoarthritis, left shoulder: Secondary | ICD-10-CM | POA: Diagnosis not present

## 2022-08-30 DIAGNOSIS — S79911A Unspecified injury of right hip, initial encounter: Secondary | ICD-10-CM | POA: Diagnosis not present

## 2022-08-30 DIAGNOSIS — S0003XD Contusion of scalp, subsequent encounter: Secondary | ICD-10-CM | POA: Diagnosis not present

## 2022-08-30 DIAGNOSIS — Z043 Encounter for examination and observation following other accident: Secondary | ICD-10-CM | POA: Diagnosis not present

## 2022-08-30 DIAGNOSIS — R001 Bradycardia, unspecified: Secondary | ICD-10-CM | POA: Diagnosis not present

## 2022-08-30 LAB — CBC
HCT: 31.9 % — ABNORMAL LOW (ref 39.0–52.0)
Hemoglobin: 10.3 g/dL — ABNORMAL LOW (ref 13.0–17.0)
MCH: 30.3 pg (ref 26.0–34.0)
MCHC: 32.3 g/dL (ref 30.0–36.0)
MCV: 93.8 fL (ref 80.0–100.0)
Platelets: 182 10*3/uL (ref 150–400)
RBC: 3.4 MIL/uL — ABNORMAL LOW (ref 4.22–5.81)
RDW: 14.8 % (ref 11.5–15.5)
WBC: 10.8 10*3/uL — ABNORMAL HIGH (ref 4.0–10.5)
nRBC: 0 % (ref 0.0–0.2)

## 2022-08-30 LAB — COMPREHENSIVE METABOLIC PANEL
ALT: 15 U/L (ref 0–44)
AST: 24 U/L (ref 15–41)
Albumin: 3.4 g/dL — ABNORMAL LOW (ref 3.5–5.0)
Alkaline Phosphatase: 61 U/L (ref 38–126)
Anion gap: 9 (ref 5–15)
BUN: 31 mg/dL — ABNORMAL HIGH (ref 8–23)
CO2: 26 mmol/L (ref 22–32)
Calcium: 8.8 mg/dL — ABNORMAL LOW (ref 8.9–10.3)
Chloride: 103 mmol/L (ref 98–111)
Creatinine, Ser: 1.36 mg/dL — ABNORMAL HIGH (ref 0.61–1.24)
GFR, Estimated: 48 mL/min — ABNORMAL LOW (ref >60)
Glucose, Bld: 133 mg/dL — ABNORMAL HIGH (ref 70–99)
Potassium: 4.2 mmol/L (ref 3.5–5.1)
Sodium: 138 mmol/L (ref 135–145)
Total Bilirubin: 0.2 mg/dL — ABNORMAL LOW (ref 0.3–1.2)
Total Protein: 6.2 g/dL — ABNORMAL LOW (ref 6.5–8.1)

## 2022-08-30 LAB — I-STAT CHEM 8, ED
BUN: 30 mg/dL — ABNORMAL HIGH (ref 8–23)
Calcium, Ion: 1.17 mmol/L (ref 1.15–1.40)
Chloride: 102 mmol/L (ref 98–111)
Creatinine, Ser: 1.4 mg/dL — ABNORMAL HIGH (ref 0.61–1.24)
Glucose, Bld: 125 mg/dL — ABNORMAL HIGH (ref 70–99)
HCT: 32 % — ABNORMAL LOW (ref 39.0–52.0)
Hemoglobin: 10.9 g/dL — ABNORMAL LOW (ref 13.0–17.0)
Potassium: 4.3 mmol/L (ref 3.5–5.1)
Sodium: 139 mmol/L (ref 135–145)
TCO2: 25 mmol/L (ref 22–32)

## 2022-08-30 LAB — ETHANOL: Alcohol, Ethyl (B): <10 mg/dL (ref <10)

## 2022-08-30 LAB — PROTIME-INR
INR: 1.6 — ABNORMAL HIGH (ref 0.8–1.2)
Prothrombin Time: 18.9 seconds — ABNORMAL HIGH (ref 11.4–15.2)

## 2022-08-30 LAB — CK: Total CK: 119 U/L (ref 49–397)

## 2022-08-30 MED ORDER — SODIUM CHLORIDE 0.9 % IV SOLN: INTRAVENOUS | Status: DC

## 2022-08-30 MED ORDER — SODIUM CHLORIDE 0.9 % IV BOLUS
1000.0000 mL | Freq: Once | INTRAVENOUS | Status: AC
  Administered 2022-08-30: 1000 mL via INTRAVENOUS

## 2022-08-30 MED ORDER — ACETAMINOPHEN 325 MG PO TABS
650.0000 mg | ORAL_TABLET | Freq: Four times a day (QID) | ORAL | Status: DC | PRN
  Administered 2022-08-30: 650 mg via ORAL
  Filled 2022-08-30: qty 2

## 2022-08-30 NOTE — Progress Notes (Signed)
Orthopedic Tech Progress Note Patient Details:  Justin Potter 03/24/1926 [id number]  Patient ID: Justin Potter, male   DOB: 09-02-1925, 89 y.o.   MRN: [id number] Level II; not needed. Vernona Rieger 08/30/2022, 8:25 PM

## 2022-08-30 NOTE — ED Provider Notes (Signed)
[facility] Provider Note  CSN: [id number] Arrival date & time: 08/30/22 2013  Chief Complaint(s) Fall  HPI Justin Potter is a 89 y.o. male with past medical history as below, significant for A-fib, prostate cancer status post seed implant, TIA, HLD, HTN, frequent falls, DOAC use for A-fib who presents to the ED with complaint of fall, level 2 trauma  Patient arrives via EMS from [facility], reports pain to his right hip and left shoulder, back of head.  He was attempting to get out of his bed and his feet became entangled and he fell to the ground.  Reports frequent falls over the past few months.  Compliant with medications including DOAC.  No LOC, no nausea or vomiting, chest pain or dyspnea.  No loss of bowel or bladder control.  No numbness or tingling seems new.   Past Medical History Past Medical History:  Diagnosis Date   Anxiety    Arthritis    "in the fingers" per pt   Atrial fibrillation ([place])    Bladder neck contracture    Dysrhythmia    A-FIB / HX BRADYCARDIA - FOLLOWED BY DR. Charlotte DUE TO [name]   Gross hematuria    History of gout    History of prostate cancer    S/P RADIOACTIVE SEED IMPLANTS   History of squamous cell carcinoma of skin    EXCISION LOWER LIP AND RIGHT EAR   History of TIA (transient ischemic attack)    probable tia no residual  09-09-2012   Hyperlipidemia    Hypertension    Macular degeneration    both eyes   Urethral stricture    Urinary retention    Patient Active Problem List   Diagnosis Date Noted   Pneumothorax 05/21/2022   Intramuscular hematoma 03/14/2022   Hematoma 03/13/2022   Junctional bradycardia 03/12/2022   Hematoma of right hip 03/12/2022   Dysphagia 11/02/2020   Dysuria 11/02/2020   Acquired thrombophilia ([place]) 11/02/2020   Advanced nonexudative age-related macular degeneration of left eye with subfoveal involvement 05/26/2020   Exudative  age-related macular degeneration of right eye with active choroidal neovascularization ([place]) 05/26/2020   Exudative age-related macular degeneration of left eye with inactive choroidal neovascularization ([place]) 05/26/2020   Advanced nonexudative age-related macular degeneration of right eye with subfoveal involvement 05/26/2020   Choroidal neovascularization of right eye 05/26/2020   Pain in right knee 10/07/2019   Long term current use of anticoagulant therapy 09/19/2013   Arthritis    S/P laparoscopic appendectomy Jan 2015 08/28/2013   Hyperlipidemia    Essential hypertension    Persistent atrial fibrillation ([place])    Home Medication(s) Prior to Admission medications   Medication Sig Start Date End Date Taking? Authorizing Provider  acetaminophen (TYLENOL) 325 MG tablet Take 2 tablets (650 mg total) by mouth every 6 (six) hours as needed for mild pain (or Fever >/= 101). Patient not taking: Reported on 06/29/2022 03/23/22   Angiulli, Lavon Paganini, PA-C  acetaminophen (TYLENOL) 500 MG tablet Take 1,000 mg by mouth every 8 (eight) hours as needed for mild pain.    [provider]  ELIQUIS 5 MG TABS tablet Take 1 tablet (5 mg total) by mouth 2 (two) times daily. 03/23/22   Angiulli, Lavon Paganini, PA-C  Multiple Vitamins-Minerals (ICAPS AREDS 2 PO) Take 1 tablet by mouth daily.    [provider]  Pediatric Multiple Vit-C-FA (ANIMAL CHEWABLE MULTIVITAMIN PO) Take 1 tablet  by mouth daily.    [provider]  Pediatric Multivit-Minerals (FLINTSTONES COMPLETE) CHEW Chew 1 tablet by mouth daily.    [provider]                                                                                                                                    Past Surgical History Past Surgical History:  Procedure Laterality Date   APPENDECTOMY     CATARACT EXTRACTION W/ INTRAOCULAR LENS  IMPLANT, BILATERAL     CHOLECYSTECTOMY  1977   CYSTOSCOPY WITH URETHRAL DILATATION N/A 02/12/2013    Procedure: CYSTOSCOPY WITH BALLOON DILATATION OF URETHRAL STRICTURE AND BLADDER FULGERATION;  Surgeon: Bernestine Amass, MD;  Location: [facility];  Service: Urology;  Laterality: N/A;   ESOPHAGOGASTRODUODENOSCOPY (EGD) WITH PROPOFOL N/A 11/02/2020   Procedure: ESOPHAGOGASTRODUODENOSCOPY (EGD) WITH PROPOFOL;  Surgeon: Clarene Essex, MD;  Location: [facility];  Service: Endoscopy;  Laterality: N/A;   LAPAROSCOPIC APPENDECTOMY N/A 08/22/2013   Procedure: APPENDECTOMY LAPAROSCOPIC;  Surgeon: Pedro Earls, MD;  Location: [facility];  Service: General;  Laterality: N/A;   [place] N/A 11/02/2020   Procedure: SAVORY DILATION;  Surgeon: Clarene Essex, MD;  Location: [facility];  Service: Endoscopy;  Laterality: N/A;   TONSILLECTOMY  as child   TOTAL KNEE ARTHROPLASTY Right 11-23-2004   Family History Family History  Problem Relation Age of Onset   Cancer Mother    Cancer Father     Social History Social History   Tobacco Use   Smoking status: Former    Packs/day: 1.50    Years: 35.00    Total pack years: 52.50    Types: Cigarettes    Quit date: 01/30/1979    Years since quitting: 43.6   Smokeless tobacco: Never  Vaping Use   Vaping Use: Never used  Substance Use Topics   Alcohol use: Yes    Comment: "5 days per week I drink 1 glass of wine"   Drug use: No   Allergies Patient has no known allergies.  Review of Systems Review of Systems  Constitutional:  Negative for chills and fever.  HENT:  Negative for facial swelling and trouble swallowing.   Eyes:  Negative for photophobia and visual disturbance.  Respiratory:  Negative for cough and shortness of breath.   Cardiovascular:  Negative for chest pain and palpitations.  Gastrointestinal:  Negative for abdominal pain, nausea and vomiting.  Endocrine: Negative for polydipsia and polyuria.  Genitourinary:  Negative for difficulty urinating and hematuria.  Musculoskeletal:  Positive for  arthralgias and back pain. Negative for gait problem and joint swelling.  Skin:  Negative for pallor and rash.  Neurological:  Negative for syncope and headaches.  Psychiatric/Behavioral:  Negative for agitation and confusion.     Physical Exam Vital Signs  I have reviewed the triage vital signs BP (!) 178/87   Pulse 67  Temp 98.6 F (37 C) (Oral)   Resp 18   Ht '6\' 2"'$  (1.88 m)   Wt 77.1 kg   SpO2 99%   BMI 21.83 kg/m  Physical Exam Vitals and nursing note reviewed.  Constitutional:      General: He is not in acute distress.    Appearance: He is well-developed. He is not diaphoretic.  HENT:     Head: Normocephalic and atraumatic.     Right Ear: External ear normal.     Left Ear: External ear normal.     Mouth/Throat:     Mouth: Mucous membranes are moist.  Eyes:     General: No scleral icterus.    Extraocular Movements: Extraocular movements intact.     Pupils: Pupils are equal, round, and reactive to light.  Neck:     Comments: C-collar Cardiovascular:     Rate and Rhythm: Normal rate and regular rhythm.     Pulses: Normal pulses.     Heart sounds: Normal heart sounds.     Comments: Extremities warm well-perfused  Pulmonary:     Effort: Pulmonary effort is normal. No respiratory distress.     Breath sounds: Normal breath sounds.  Abdominal:     General: Abdomen is flat.     Palpations: Abdomen is soft.     Tenderness: There is no abdominal tenderness.  Musculoskeletal:       Arms:     Cervical back: No rigidity.     Right lower leg: No edema.     Left lower leg: No edema.     Comments: Pelvis stable to AP pressure  No significant pain with logroll bilateral lower extremities  No step-off or crepitus to spine    Skin:    General: Skin is warm and dry.     Capillary Refill: Capillary refill takes less than 2 seconds.  Neurological:     Mental Status: He is alert and oriented to person, place, and time.     GCS: GCS eye subscore is 4. GCS verbal  subscore is 5. GCS motor subscore is 6.  Psychiatric:        Mood and Affect: Mood normal.        Behavior: Behavior normal.     ED Results and Treatments Labs (all labs ordered are listed, but only abnormal results are displayed) Labs Reviewed  COMPREHENSIVE METABOLIC PANEL - Abnormal; Notable for the following components:      Result Value   Glucose, Bld 133 (*)    BUN 31 (*)    Creatinine, Ser 1.36 (*)    Calcium 8.8 (*)    Total Protein 6.2 (*)    Albumin 3.4 (*)    Total Bilirubin 0.2 (*)    GFR, Estimated 48 (*)    All other components within normal limits  CBC - Abnormal; Notable for the following components:   WBC 10.8 (*)    RBC 3.40 (*)    Hemoglobin 10.3 (*)    HCT 31.9 (*)    All other components within normal limits  PROTIME-INR - Abnormal; Notable for the following components:   Prothrombin Time 18.9 (*)    INR 1.6 (*)    All other components within normal limits  I-STAT CHEM 8, ED - Abnormal; Notable for the following components:   BUN 30 (*)    Creatinine, Ser 1.40 (*)    Glucose, Bld 125 (*)    Hemoglobin 10.9 (*)    HCT 32.0 (*)  All other components within normal limits  ETHANOL  CK  URINALYSIS, ROUTINE W REFLEX MICROSCOPIC  LACTIC ACID, PLASMA  SAMPLE TO BLOOD BANK                                                                                                                          Radiology CT Hip Right Wo Contrast  Result Date: 08/30/2022 CLINICAL DATA:  Hip trauma with fracture suspected. X-ray done. Mechanical fall. Level 2. EXAM: CT OF THE RIGHT HIP WITHOUT CONTRAST TECHNIQUE: Multidetector CT imaging of the right hip was performed according to the standard protocol. Multiplanar CT image reconstructions were also generated. RADIATION DOSE REDUCTION: This exam was performed according to the departmental dose-optimization program which includes automated exposure control, adjustment of the mA and/or kV according to patient size and/or use of  iterative reconstruction technique. COMPARISON:  Pelvis radiographs 08/30/2022 FINDINGS: Bones/Joint/Cartilage Degenerative changes in the right hip with narrowing and sclerosis of the acetabular joint and small osteophyte formation. No evidence of acute fracture or dislocation. Visualized portion of the right hemipelvis and right sacrum are unremarkable. Ligaments Suboptimally assessed by CT. Muscles and Tendons Fatty atrophy of the hip musculature. Mild expansion and increased density in the inferior aspect of the gluteus maximus muscles suggesting intramuscular hematoma measuring up to about 5 cm in diameter. Soft tissues Soft tissue swelling and infiltration in the subcutaneous fat lateral and posterior to the right hip likely representing contusion. No loculated soft tissue collections are otherwise demonstrated. Incidental note of seed implants in the prostate gland. IMPRESSION: 1. No acute displaced fractures demonstrated in the right hip. 2. Degenerative changes in the right hip. 3. Soft tissue changes consistent with intramuscular hematoma in the gluteus maximus muscle and soft tissue contusion in the surrounding fat. Electronically Signed   By: Lucienne Capers M.D.   On: 08/30/2022 22:45   CT LUMBAR SPINE WO CONTRAST  Result Date: 08/30/2022 CLINICAL DATA:  Fall. EXAM: CT THORACIC AND LUMBAR SPINE WITHOUT CONTRAST TECHNIQUE: Multidetector CT imaging of the thoracic and lumbar spine was performed without contrast. Multiplanar CT image reconstructions were also generated. RADIATION DOSE REDUCTION: This exam was performed according to the departmental dose-optimization program which includes automated exposure control, adjustment of the mA and/or kV according to patient size and/or use of iterative reconstruction technique. COMPARISON:  Chest radiograph dated 06/29/2022. FINDINGS: CT THORACIC SPINE FINDINGS Alignment: No acute subluxation. Vertebrae: Evaluation for fracture is limited due to osteopenia.  There is a nondisplaced corner fracture of the anterior aspect of superior endplate of T3 (sagittal 59/13). There is compression fracture of the T6 vertebra with fracture line extending to the anterior cortex and superior endplate. This is likely acute. Correlation with point tenderness recommended. No significant loss of vertebral body height. No retropulsion. Paraspinal and other soft tissues: No perispinal fluid collection or hematoma. Left lung base atelectasis/scarring. Partially visualized small pericardial effusion measuring up to 13 mm in thickness. Disc levels: No acute findings.  Degenerative changes.  CT LUMBAR SPINE FINDINGS Segmentation: 5 lumbar type vertebrae. Alignment: No acute subluxation. Vertebrae: No acute fracture.  Osteopenia. Paraspinal and other soft tissues: No paraspinal fluid collection or hematoma. Atherosclerotic calcification of the aorta. Sigmoid diverticulosis. Disc levels: No acute findings.  Degenerative changes. IMPRESSION: 1. Acute appearing compression fracture of the T6 vertebra. 2. Acute nondisplaced corner fracture of the anterior aspect of the superior endplate of T3. 3. No acute/traumatic lumbar spine pathology. 4. Partially visualized small pericardial effusion. 5.  Aortic Atherosclerosis (ICD10-I70.0). Electronically Signed   By: Anner Crete M.D.   On: 08/30/2022 21:31   CT THORACIC SPINE WO CONTRAST  Result Date: 08/30/2022 CLINICAL DATA:  Fall. EXAM: CT THORACIC AND LUMBAR SPINE WITHOUT CONTRAST TECHNIQUE: Multidetector CT imaging of the thoracic and lumbar spine was performed without contrast. Multiplanar CT image reconstructions were also generated. RADIATION DOSE REDUCTION: This exam was performed according to the departmental dose-optimization program which includes automated exposure control, adjustment of the mA and/or kV according to patient size and/or use of iterative reconstruction technique. COMPARISON:  Chest radiograph dated 06/29/2022. FINDINGS: CT  THORACIC SPINE FINDINGS Alignment: No acute subluxation. Vertebrae: Evaluation for fracture is limited due to osteopenia. There is a nondisplaced corner fracture of the anterior aspect of superior endplate of T3 (sagittal 59/13). There is compression fracture of the T6 vertebra with fracture line extending to the anterior cortex and superior endplate. This is likely acute. Correlation with point tenderness recommended. No significant loss of vertebral body height. No retropulsion. Paraspinal and other soft tissues: No perispinal fluid collection or hematoma. Left lung base atelectasis/scarring. Partially visualized small pericardial effusion measuring up to 13 mm in thickness. Disc levels: No acute findings.  Degenerative changes. CT LUMBAR SPINE FINDINGS Segmentation: 5 lumbar type vertebrae. Alignment: No acute subluxation. Vertebrae: No acute fracture.  Osteopenia. Paraspinal and other soft tissues: No paraspinal fluid collection or hematoma. Atherosclerotic calcification of the aorta. Sigmoid diverticulosis. Disc levels: No acute findings.  Degenerative changes. IMPRESSION: 1. Acute appearing compression fracture of the T6 vertebra. 2. Acute nondisplaced corner fracture of the anterior aspect of the superior endplate of T3. 3. No acute/traumatic lumbar spine pathology. 4. Partially visualized small pericardial effusion. 5.  Aortic Atherosclerosis (ICD10-I70.0). Electronically Signed   By: Anner Crete M.D.   On: 08/30/2022 21:31   CT HEAD WO CONTRAST  Result Date: 08/30/2022 CLINICAL DATA:  Trauma fall EXAM: CT HEAD WITHOUT CONTRAST CT CERVICAL SPINE WITHOUT CONTRAST TECHNIQUE: Multidetector CT imaging of the head and cervical spine was performed following the standard protocol without intravenous contrast. Multiplanar CT image reconstructions of the cervical spine were also generated. RADIATION DOSE REDUCTION: This exam was performed according to the departmental dose-optimization program which includes  automated exposure control, adjustment of the mA and/or kV according to patient size and/or use of iterative reconstruction technique. COMPARISON:  CT brain and cervical spine 06/28/2022 FINDINGS: CT HEAD FINDINGS Brain: No acute territorial infarction, hemorrhage or intracranial mass. 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 Atrophy. Mild chronic small vessel ischemic changes of the white matter. Stable ventricle size Vascular: No hyperdense vessels.  Carotid vascular calcification. Skull: Normal. Negative for fracture or focal lesion. Sinuses/Orbits: No acute finding. Other: Small left posterior scalp hematoma. Small fluid in the inferior mastoids CT CERVICAL SPINE FINDINGS Alignment: No subluxation.  Facet alignment is within normal limits. Skull base and vertebrae: No acute fracture. No primary bone lesion or focal pathologic process. Soft tissues and spinal canal: No prevertebral fluid or swelling. No visible canal  hematoma. Disc levels: Multilevel degenerative changes, worst at C5-C6 and C6-C7. Hypertrophic facet degenerative changes at multiple levels. Upper chest: Negative. Other: None IMPRESSION: No CT evidence for acute intracranial abnormality. Atrophy and chronic small vessel ischemic changes of the white matter. 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 Degenerative changes of the cervical spine. No acute osseous abnormality. Electronically Signed   By: Donavan Foil M.D.   On: 08/30/2022 21:27   CT CERVICAL SPINE WO CONTRAST  Result Date: 08/30/2022 CLINICAL DATA:  Trauma fall EXAM: CT HEAD WITHOUT CONTRAST CT CERVICAL SPINE WITHOUT CONTRAST TECHNIQUE: Multidetector CT imaging of the head and cervical spine was performed following the standard protocol without intravenous contrast. Multiplanar CT image reconstructions of the cervical spine were also generated. RADIATION DOSE REDUCTION: This exam was performed according to the departmental dose-optimization program which includes  automated exposure control, adjustment of the mA and/or kV according to patient size and/or use of iterative reconstruction technique. COMPARISON:  CT brain and cervical spine 06/28/2022 FINDINGS: CT HEAD FINDINGS Brain: No acute territorial infarction, hemorrhage or intracranial mass. 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 Atrophy. Mild chronic small vessel ischemic changes of the white matter. Stable ventricle size Vascular: No hyperdense vessels.  Carotid vascular calcification. Skull: Normal. Negative for fracture or focal lesion. Sinuses/Orbits: No acute finding. Other: Small left posterior scalp hematoma. Small fluid in the inferior mastoids CT CERVICAL SPINE FINDINGS Alignment: No subluxation.  Facet alignment is within normal limits. Skull base and vertebrae: No acute fracture. No primary bone lesion or focal pathologic process. Soft tissues and spinal canal: No prevertebral fluid or swelling. No visible canal hematoma. Disc levels: Multilevel degenerative changes, worst at C5-C6 and C6-C7. Hypertrophic facet degenerative changes at multiple levels. Upper chest: Negative. Other: None IMPRESSION: No CT evidence for acute intracranial abnormality. Atrophy and chronic small vessel ischemic changes of the white matter. 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 Degenerative changes of the cervical spine. No acute osseous abnormality. Electronically Signed   By: Donavan Foil M.D.   On: 08/30/2022 21:27   DG Shoulder Left Port  Result Date: 08/30/2022 CLINICAL DATA:  Blunt trauma, fall with left shoulder pain EXAM: LEFT SHOULDER COMPARISON:  None Available. FINDINGS: Mild degenerative AC joint spurring. Mild spurring of the inferior glenoid and humeral head. Subacromial morphology is type 2 (curved). No fracture or malalignment observed. IMPRESSION: 1. Mild degenerative findings in the shoulder. No fracture or dislocation identified. Electronically Signed   By: Van Clines M.D.    On: 08/30/2022 21:02   DG Pelvis Portable  Result Date: 08/30/2022 CLINICAL DATA:  Fall, right hip pain EXAM: PORTABLE PELVIS 1-2 VIEWS COMPARISON:  03/28/2022 MRI of the right hip FINDINGS: Brachytherapy seed implants in the prostate gland. Mild degenerative hip arthropathy bilaterally including articular space narrowing and mild spurring of the femoral heads. No pelvic fracture or visible acute bony finding. Prominent stool throughout the colon favors constipation. IMPRESSION: 1. No pelvic fracture or visible acute bony finding. 2. Mild degenerative hip arthropathy bilaterally. 3. Prominent stool throughout the colon favors constipation. Electronically Signed   By: Van Clines M.D.   On: 08/30/2022 21:00   DG Chest Port 1 View  Result Date: 08/30/2022 CLINICAL DATA:  Mechanical fall, left thoracic pain EXAM: PORTABLE CHEST 1 VIEW COMPARISON:  06/28/2022 FINDINGS: Mild to moderate enlargement of the cardiopericardial silhouette, stable. No edema. No blunting of the costophrenic angles. The lungs appear clear. No pneumothorax or well-defined rib discontinuity. IMPRESSION: 1. No acute findings.  2. Mild to moderate enlargement of the cardiopericardial silhouette, without edema. Electronically Signed   By: Van Clines M.D.   On: 08/30/2022 20:58    Pertinent labs & imaging results that were available during my care of the patient were reviewed by me and considered in my medical decision making (see MDM for details).  Medications Ordered in ED Medications  acetaminophen (TYLENOL) tablet 650 mg (650 mg Oral Given 08/30/22 2209)  0.9 %  sodium chloride infusion (has no administration in time range)  sodium chloride 0.9 % bolus 1,000 mL (0 mLs Intravenous Stopped 08/30/22 2352)                                                                                                                                     Procedures .Critical Care  Performed by: Jeanell Sparrow, DO Authorized by: Jeanell Sparrow, DO   Critical care provider statement:    Critical care time (minutes):  30   Critical care time was exclusive of:  Separately billable procedures and treating other patients   Critical care was necessary to treat or prevent imminent or life-threatening deterioration of the following conditions:  Trauma and dehydration   Critical care was time spent personally by me on the following activities:  Development of treatment plan with patient or surrogate, discussions with consultants, evaluation of patient's response to treatment, examination of patient, ordering and review of laboratory studies, ordering and review of radiographic studies, ordering and performing treatments and interventions, pulse oximetry, re-evaluation of patient's condition, review of old charts and obtaining history from patient or surrogate   Care discussed with: admitting provider     (including critical care time)  Medical Decision Making / ED Course    Medical Decision Making:    Justin Potter is a 89 y.o. male with past medical history as below, significant for A-fib, prostate cancer status post seed implant, TIA, HLD, HTN, frequent falls, DOAC use for A-fib who presents to the ED with complaint of fall, level 2 trauma. The complaint involves an extensive differential diagnosis and also carries with it a high risk of complications and morbidity.  Serious etiology was considered. Ddx includes but is not limited to: Differential diagnoses for head trauma includes subdural hematoma, epidural hematoma, acute concussion, traumatic subarachnoid hemorrhage, cerebral contusions, etc.   Complete initial physical exam performed, notably the patient  was no acute distress, C-spine precautions in place..    Reviewed and confirmed nursing documentation for past medical history, family history, social history.  Vital signs reviewed.   Primary survey, airway intact, breathing clearly bilateral, no hypoxia, trachea is  midline.  Pulses equal 4 extremities, extremities warm well-perfused.  GCS 15, pupils 3 mm briskly reactive bilateral Clinical Course as of 08/30/22 2358  Wed Aug 30, 2022  2208 "IMPRESSION: 1. Acute appearing compression fracture of the T6 vertebra. 2. Acute nondisplaced corner fracture of the anterior  aspect of the superior endplate of T3." [SG]  2706 "3. Soft tissue changes consistent with intramuscular hematoma in the gluteus maximus muscle and soft tissue contusion in the surrounding fat." [SG]  2328 Patient with soft tissue hematoma to his buttock, he is anticoagulated.  Patient also with T6 compression fracture and T3 corner fracture.  He is unable to ambulate safely.  Multiple falls over the past few weeks. At this is time would recommend admission for serial compartment checks, pain control. [SG]  2330 Creatinine(!): 1.36 BUN 30; c/w dehydration aki; give IVF [SG]  2339 D/w son at bedside in regards to care today and results. Recommend admission, pt/family is agreeable. Will d/w hospitalist  [SG]  2341 Pt would also likely benefit from PT/OT eval [SG]    Clinical Course User Index [SG] Jeanell Sparrow, DO   Patient with multiple falls over the past few weeks, worsening ability to perform adl's recently/unable to ambulate safely, new compression fracture T6 and T3.  His gluteal hematoma, anticoagulated, AKI.  Recommend admission for serial compartment checks, rehydration, PT OT, pain control.   Discussed with [facility] who accepts patient for admission  Additional history obtained: -Additional history obtained from family -External records from outside source obtained and reviewed including: Chart review including previous notes, labs, imaging, consultation notes including prior ED visits, prior labs and imaging, home medications   Lab Tests: -I ordered, reviewed, and interpreted labs.   The pertinent results include:   Labs Reviewed  COMPREHENSIVE METABOLIC PANEL - Abnormal; Notable  for the following components:      Result Value   Glucose, Bld 133 (*)    BUN 31 (*)    Creatinine, Ser 1.36 (*)    Calcium 8.8 (*)    Total Protein 6.2 (*)    Albumin 3.4 (*)    Total Bilirubin 0.2 (*)    GFR, Estimated 48 (*)    All other components within normal limits  CBC - Abnormal; Notable for the following components:   WBC 10.8 (*)    RBC 3.40 (*)    Hemoglobin 10.3 (*)    HCT 31.9 (*)    All other components within normal limits  PROTIME-INR - Abnormal; Notable for the following components:   Prothrombin Time 18.9 (*)    INR 1.6 (*)    All other components within normal limits  I-STAT CHEM 8, ED - Abnormal; Notable for the following components:   BUN 30 (*)    Creatinine, Ser 1.40 (*)    Glucose, Bld 125 (*)    Hemoglobin 10.9 (*)    HCT 32.0 (*)    All other components within normal limits  ETHANOL  CK  URINALYSIS, ROUTINE W REFLEX MICROSCOPIC  LACTIC ACID, PLASMA  SAMPLE TO BLOOD BANK    Notable for AKI  EKG   EKG Interpretation  Date/Time:    Ventricular Rate:    PR Interval:    QRS Duration:   QT Interval:    QTC Calculation:   R Axis:     Text Interpretation:           Imaging Studies ordered: I ordered imaging studies including CTH, ct spine, cxr/pelvis xr/ shoulder xr I independently visualized the following imaging with scope of interpretation limited to determining acute life threatening conditions related to emergency care: as above I independently visualized and interpreted imaging. I agree with the radiologist interpretation   Medicines ordered and prescription drug management: Meds ordered this encounter  Medications  sodium chloride 0.9 % bolus 1,000 mL   acetaminophen (TYLENOL) tablet 650 mg   0.9 %  sodium chloride infusion    -I have reviewed the patients home medicines and have made adjustments as needed   Consultations Obtained: na   Cardiac Monitoring: The patient was maintained on a cardiac monitor.  I  personally viewed and interpreted the cardiac monitored which showed an underlying rhythm of: nsr  Social Determinants of Health:  Diagnosis or treatment significantly limited by social determinants of health: former smoker   Reevaluation: After the interventions noted above, I reevaluated the patient and found that they have improved  Co morbidities that complicate the patient evaluation  Past Medical History:  Diagnosis Date   Anxiety    Arthritis    "in the fingers" per pt   Atrial fibrillation ([place])    Bladder neck contracture    Dysrhythmia    A-FIB / HX BRADYCARDIA - FOLLOWED BY DR. Ensign DUE TO [name]   Gross hematuria    History of gout    History of prostate cancer    S/P RADIOACTIVE SEED IMPLANTS   History of squamous cell carcinoma of skin    EXCISION LOWER LIP AND RIGHT EAR   History of TIA (transient ischemic attack)    probable tia no residual  09-09-2012   Hyperlipidemia    Hypertension    Macular degeneration    both eyes   Urethral stricture    Urinary retention       Dispostion: Disposition decision including need for hospitalization was considered, and patient admitted to the hospital.    Final Clinical Impression(s) / ED Diagnoses Final diagnoses:  AKI (acute kidney injury) ([place])  Recurrent falls  Hematoma  Closed wedge compression fracture of T6 vertebra, initial encounter ([place])     This chart was dictated using voice recognition software.  Despite best efforts to proofread,  errors can occur which can change the documentation meaning.    Jeanell Sparrow, DO 08/30/22 2359

## 2022-08-30 NOTE — Progress Notes (Signed)
   08/30/22 2102  Spiritual Encounters  Type of Visit Initial  Care provided to: Pt and family  Reason for visit Trauma  OnCall Visit Yes  Spiritual Framework  Presenting Themes Impactful experiences and emotions  Community/Connection Family  Interventions  Spiritual Care Interventions Made Established relationship of care and support;Compassionate presence  Intervention Outcomes  Outcomes Connection to spiritual care;Awareness of support;Reduced anxiety  Spiritual Care Plan  Spiritual Care Issues Still Outstanding No further spiritual care needs at this time (see row info)   Chaplain Yaman Grauberger responded to page for Trauma 1, pt fell at care facility. He was alert upon arrival, his son, who is a former [facility] [employer], was at his bedside. Chaplain provided a calm presence and active listening.

## 2022-08-30 NOTE — ED Notes (Signed)
EDP at bedside for assessment, pt being placed on monitor at this time. Pt A&O, NAD noted. Even, unlabored breathing, CNS intact in all 4 extremities

## 2022-08-30 NOTE — ED Triage Notes (Signed)
Pt via [facility] as level 2 FoT; from [place]. Mechanical fall, R hip, L shoulder, L side of thoracic spine. EMS reports "mild fall" 2 days ago, no eval at that time. Alert to baseline

## 2022-08-31 DIAGNOSIS — Z85828 Personal history of other malignant neoplasm of skin: Secondary | ICD-10-CM | POA: Diagnosis not present

## 2022-08-31 DIAGNOSIS — Z9049 Acquired absence of other specified parts of digestive tract: Secondary | ICD-10-CM | POA: Diagnosis not present

## 2022-08-31 DIAGNOSIS — F05 Delirium due to known physiological condition: Secondary | ICD-10-CM | POA: Diagnosis not present

## 2022-08-31 DIAGNOSIS — Y92099 Unspecified place in other non-institutional residence as the place of occurrence of the external cause: Secondary | ICD-10-CM | POA: Diagnosis not present

## 2022-08-31 DIAGNOSIS — R7989 Other specified abnormal findings of blood chemistry: Secondary | ICD-10-CM | POA: Diagnosis not present

## 2022-08-31 DIAGNOSIS — S22050A Wedge compression fracture of T5-T6 vertebra, initial encounter for closed fracture: Secondary | ICD-10-CM | POA: Diagnosis not present

## 2022-08-31 DIAGNOSIS — Z96651 Presence of right artificial knee joint: Secondary | ICD-10-CM | POA: Diagnosis present

## 2022-08-31 DIAGNOSIS — S22031A Stable burst fracture of third thoracic vertebra, initial encounter for closed fracture: Secondary | ICD-10-CM | POA: Diagnosis present

## 2022-08-31 DIAGNOSIS — S0003XD Contusion of scalp, subsequent encounter: Secondary | ICD-10-CM | POA: Diagnosis not present

## 2022-08-31 DIAGNOSIS — D649 Anemia, unspecified: Secondary | ICD-10-CM | POA: Diagnosis not present

## 2022-08-31 DIAGNOSIS — M25551 Pain in right hip: Secondary | ICD-10-CM | POA: Diagnosis present

## 2022-08-31 DIAGNOSIS — R296 Repeated falls: Secondary | ICD-10-CM | POA: Diagnosis present

## 2022-08-31 DIAGNOSIS — R443 Hallucinations, unspecified: Secondary | ICD-10-CM | POA: Diagnosis not present

## 2022-08-31 DIAGNOSIS — S22000A Wedge compression fracture of unspecified thoracic vertebra, initial encounter for closed fracture: Secondary | ICD-10-CM | POA: Diagnosis present

## 2022-08-31 DIAGNOSIS — K5900 Constipation, unspecified: Secondary | ICD-10-CM | POA: Diagnosis not present

## 2022-08-31 DIAGNOSIS — D62 Acute posthemorrhagic anemia: Secondary | ICD-10-CM | POA: Diagnosis present

## 2022-08-31 DIAGNOSIS — S22009A Unspecified fracture of unspecified thoracic vertebra, initial encounter for closed fracture: Secondary | ICD-10-CM | POA: Diagnosis present

## 2022-08-31 DIAGNOSIS — I4819 Other persistent atrial fibrillation: Secondary | ICD-10-CM | POA: Diagnosis not present

## 2022-08-31 DIAGNOSIS — E860 Dehydration: Secondary | ICD-10-CM | POA: Diagnosis present

## 2022-08-31 DIAGNOSIS — A499 Bacterial infection, unspecified: Secondary | ICD-10-CM | POA: Diagnosis not present

## 2022-08-31 DIAGNOSIS — I10 Essential (primary) hypertension: Secondary | ICD-10-CM

## 2022-08-31 DIAGNOSIS — W19XXXD Unspecified fall, subsequent encounter: Secondary | ICD-10-CM | POA: Diagnosis present

## 2022-08-31 DIAGNOSIS — I4821 Permanent atrial fibrillation: Secondary | ICD-10-CM | POA: Diagnosis present

## 2022-08-31 DIAGNOSIS — N179 Acute kidney failure, unspecified: Secondary | ICD-10-CM

## 2022-08-31 DIAGNOSIS — Z7901 Long term (current) use of anticoagulants: Secondary | ICD-10-CM | POA: Diagnosis not present

## 2022-08-31 DIAGNOSIS — Z9842 Cataract extraction status, left eye: Secondary | ICD-10-CM | POA: Diagnosis not present

## 2022-08-31 DIAGNOSIS — T148XXA Other injury of unspecified body region, initial encounter: Secondary | ICD-10-CM

## 2022-08-31 DIAGNOSIS — Z961 Presence of intraocular lens: Secondary | ICD-10-CM | POA: Diagnosis present

## 2022-08-31 DIAGNOSIS — S22039A Unspecified fracture of third thoracic vertebra, initial encounter for closed fracture: Secondary | ICD-10-CM

## 2022-08-31 DIAGNOSIS — R52 Pain, unspecified: Secondary | ICD-10-CM | POA: Diagnosis not present

## 2022-08-31 DIAGNOSIS — Z79899 Other long term (current) drug therapy: Secondary | ICD-10-CM | POA: Diagnosis not present

## 2022-08-31 DIAGNOSIS — S300XXA Contusion of lower back and pelvis, initial encounter: Secondary | ICD-10-CM | POA: Diagnosis present

## 2022-08-31 DIAGNOSIS — S22059D Unspecified fracture of T5-T6 vertebra, subsequent encounter for fracture with routine healing: Secondary | ICD-10-CM | POA: Diagnosis present

## 2022-08-31 DIAGNOSIS — R471 Dysarthria and anarthria: Secondary | ICD-10-CM | POA: Diagnosis present

## 2022-08-31 DIAGNOSIS — Z809 Family history of malignant neoplasm, unspecified: Secondary | ICD-10-CM | POA: Diagnosis not present

## 2022-08-31 DIAGNOSIS — Z9181 History of falling: Secondary | ICD-10-CM | POA: Diagnosis not present

## 2022-08-31 DIAGNOSIS — Z9841 Cataract extraction status, right eye: Secondary | ICD-10-CM | POA: Diagnosis not present

## 2022-08-31 DIAGNOSIS — Z8546 Personal history of malignant neoplasm of prostate: Secondary | ICD-10-CM | POA: Diagnosis not present

## 2022-08-31 DIAGNOSIS — W1830XA Fall on same level, unspecified, initial encounter: Secondary | ICD-10-CM | POA: Diagnosis present

## 2022-08-31 DIAGNOSIS — S22059A Unspecified fracture of T5-T6 vertebra, initial encounter for closed fracture: Secondary | ICD-10-CM

## 2022-08-31 DIAGNOSIS — S22009S Unspecified fracture of unspecified thoracic vertebra, sequela: Secondary | ICD-10-CM | POA: Diagnosis not present

## 2022-08-31 DIAGNOSIS — Z8673 Personal history of transient ischemic attack (TIA), and cerebral infarction without residual deficits: Secondary | ICD-10-CM | POA: Diagnosis not present

## 2022-08-31 DIAGNOSIS — N390 Urinary tract infection, site not specified: Secondary | ICD-10-CM | POA: Diagnosis not present

## 2022-08-31 DIAGNOSIS — S22058A Other fracture of T5-T6 vertebra, initial encounter for closed fracture: Secondary | ICD-10-CM

## 2022-08-31 DIAGNOSIS — Z87891 Personal history of nicotine dependence: Secondary | ICD-10-CM | POA: Diagnosis not present

## 2022-08-31 DIAGNOSIS — S22051A Stable burst fracture of T5-T6 vertebra, initial encounter for closed fracture: Secondary | ICD-10-CM | POA: Diagnosis present

## 2022-08-31 DIAGNOSIS — Z66 Do not resuscitate: Secondary | ICD-10-CM | POA: Diagnosis present

## 2022-08-31 DIAGNOSIS — E785 Hyperlipidemia, unspecified: Secondary | ICD-10-CM | POA: Diagnosis present

## 2022-08-31 DIAGNOSIS — R5381 Other malaise: Secondary | ICD-10-CM | POA: Diagnosis present

## 2022-08-31 DIAGNOSIS — D72829 Elevated white blood cell count, unspecified: Secondary | ICD-10-CM | POA: Diagnosis present

## 2022-08-31 DIAGNOSIS — R410 Disorientation, unspecified: Secondary | ICD-10-CM | POA: Diagnosis not present

## 2022-08-31 DIAGNOSIS — I4891 Unspecified atrial fibrillation: Secondary | ICD-10-CM | POA: Diagnosis present

## 2022-08-31 DIAGNOSIS — S0003XA Contusion of scalp, initial encounter: Secondary | ICD-10-CM | POA: Diagnosis present

## 2022-08-31 LAB — URINALYSIS, ROUTINE W REFLEX MICROSCOPIC
Bilirubin Urine: NEGATIVE
Glucose, UA: NEGATIVE mg/dL
Hgb urine dipstick: NEGATIVE
Ketones, ur: 5 mg/dL — AB
Nitrite: NEGATIVE
Protein, ur: NEGATIVE mg/dL
Specific Gravity, Urine: 1.018 (ref 1.005–1.030)
pH: 8 (ref 5.0–8.0)

## 2022-08-31 LAB — CBC
HCT: 32.8 % — ABNORMAL LOW (ref 39.0–52.0)
Hemoglobin: 10.4 g/dL — ABNORMAL LOW (ref 13.0–17.0)
MCH: 29.6 pg (ref 26.0–34.0)
MCHC: 31.7 g/dL (ref 30.0–36.0)
MCV: 93.4 fL (ref 80.0–100.0)
Platelets: 205 10*3/uL (ref 150–400)
RBC: 3.51 MIL/uL — ABNORMAL LOW (ref 4.22–5.81)
RDW: 14.8 % (ref 11.5–15.5)
WBC: 12.2 10*3/uL — ABNORMAL HIGH (ref 4.0–10.5)
nRBC: 0 % (ref 0.0–0.2)

## 2022-08-31 LAB — BASIC METABOLIC PANEL
Anion gap: 9 (ref 5–15)
BUN: 28 mg/dL — ABNORMAL HIGH (ref 8–23)
CO2: 25 mmol/L (ref 22–32)
Calcium: 8.5 mg/dL — ABNORMAL LOW (ref 8.9–10.3)
Chloride: 102 mmol/L (ref 98–111)
Creatinine, Ser: 0.96 mg/dL (ref 0.61–1.24)
GFR, Estimated: >60 mL/min (ref >60)
Glucose, Bld: 122 mg/dL — ABNORMAL HIGH (ref 70–99)
Potassium: 3.8 mmol/L (ref 3.5–5.1)
Sodium: 136 mmol/L (ref 135–145)

## 2022-08-31 LAB — SAMPLE TO BLOOD BANK

## 2022-08-31 LAB — LACTIC ACID, PLASMA: Lactic Acid, Venous: 1 mmol/L (ref 0.5–1.9)

## 2022-08-31 MED ORDER — APIXABAN 5 MG PO TABS: 5.0000 mg | ORAL_TABLET | Freq: Two times a day (BID) | ORAL | Status: DC

## 2022-08-31 MED ORDER — LACTATED RINGERS IV SOLN: INTRAVENOUS | Status: DC

## 2022-08-31 MED ORDER — HYDRALAZINE HCL 20 MG/ML IJ SOLN
5.0000 mg | Freq: Four times a day (QID) | INTRAMUSCULAR | Status: DC | PRN
  Administered 2022-09-04: 5 mg via INTRAVENOUS
  Filled 2022-08-31: qty 1

## 2022-08-31 MED ORDER — HYDROCODONE-ACETAMINOPHEN 5-325 MG PO TABS: 1.0000 | ORAL_TABLET | ORAL | Status: DC | PRN

## 2022-08-31 MED ORDER — SODIUM CHLORIDE 0.9 % IV SOLN: INTRAVENOUS | Status: DC

## 2022-08-31 MED ORDER — ACETAMINOPHEN 325 MG PO TABS
650.0000 mg | ORAL_TABLET | Freq: Four times a day (QID) | ORAL | Status: DC | PRN
  Administered 2022-08-31 – 2022-09-05 (×10): 650 mg via ORAL
  Filled 2022-08-31 (×10): qty 2

## 2022-08-31 MED ORDER — SENNOSIDES-DOCUSATE SODIUM 8.6-50 MG PO TABS
1.0000 | ORAL_TABLET | Freq: Every day | ORAL | Status: DC
  Administered 2022-08-31 – 2022-09-05 (×6): 1 via ORAL
  Filled 2022-08-31 (×6): qty 1

## 2022-08-31 NOTE — Progress Notes (Signed)
Orthopedic Tech Progress Note Patient Details:  Justin Potter 23-Feb-1926 [id number]  Ortho Devices Type of Ortho Device: Thoracolumbar corset (TLSO) Ortho Device/Splint Interventions: Ordered, Adjustment     TLSO adjusted and left at bedside. Vernona Rieger 08/31/2022, 6:39 AM

## 2022-08-31 NOTE — ED Notes (Signed)
ED TO INPATIENT HANDOFF REPORT  ED Nurse Name and Phone #: [phone]  S Name/Age/Gender Justin Potter 89 y.o. male Room/Bed: TRAAC/TRAAC  Code Status   Code Status: DNR  Home/SNF/Other Nursing Home Patient oriented to: self, place, time, and situation Is this baseline? Yes   Triage Complete: Triage complete  Chief Complaint Thoracic spine fracture ([place]) [W23.762G]  Triage Note Pt via [facility] as level 2 FoT; from [place]. Mechanical fall, R hip, L shoulder, L side of thoracic spine. EMS reports "mild fall" 2 days ago, no eval at that time. Alert to baseline   Allergies No Known Allergies  Level of Care/Admitting Diagnosis ED Disposition     ED Disposition  Admit   Condition  --   Comment  Hospital Area: [place] [[id number]]  Level of Care: Telemetry Medical [104]  May place patient in observation at [facility] or [place] if equivalent level of care is available:: No  Covid Evaluation: Asymptomatic - no recent exposure (last 10 days) testing not required  Diagnosis: Thoracic spine fracture [facility]) [[id number]]  Admitting Physician: Orene Desanctis [[id number]]  Attending Physician: Orene Desanctis [[id number]]          B Medical/Surgery History Past Medical History:  Diagnosis Date   Anxiety    Arthritis    "in the fingers" per pt   Atrial fibrillation ([place])    Bladder neck contracture    Dysrhythmia    A-FIB / HX BRADYCARDIA - FOLLOWED BY DR. Milford DUE TO [name]   Gross hematuria    History of gout    History of prostate cancer    S/P RADIOACTIVE SEED IMPLANTS   History of squamous cell carcinoma of skin    EXCISION LOWER LIP AND RIGHT EAR   History of TIA (transient ischemic attack)    probable tia no residual  09-09-2012   Hyperlipidemia    Hypertension    Macular degeneration    both eyes   Urethral stricture    Urinary retention    Past Surgical History:  Procedure Laterality Date   APPENDECTOMY      CATARACT EXTRACTION W/ INTRAOCULAR LENS  IMPLANT, BILATERAL     CHOLECYSTECTOMY  1977   CYSTOSCOPY WITH URETHRAL DILATATION N/A 02/12/2013   Procedure: CYSTOSCOPY WITH BALLOON DILATATION OF URETHRAL STRICTURE AND BLADDER FULGERATION;  Surgeon: Bernestine Amass, MD;  Location: [facility];  Service: Urology;  Laterality: N/A;   ESOPHAGOGASTRODUODENOSCOPY (EGD) WITH PROPOFOL N/A 11/02/2020   Procedure: ESOPHAGOGASTRODUODENOSCOPY (EGD) WITH PROPOFOL;  Surgeon: Clarene Essex, MD;  Location: [facility];  Service: Endoscopy;  Laterality: N/A;   LAPAROSCOPIC APPENDECTOMY N/A 08/22/2013   Procedure: APPENDECTOMY LAPAROSCOPIC;  Surgeon: Pedro Earls, MD;  Location: [facility];  Service: General;  Laterality: N/A;   [place] N/A 11/02/2020   Procedure: SAVORY DILATION;  Surgeon: Clarene Essex, MD;  Location: [facility];  Service: Endoscopy;  Laterality: N/A;   TONSILLECTOMY  as child   TOTAL KNEE ARTHROPLASTY Right 11-23-2004     A IV Location/Drains/Wounds Patient Lines/Drains/Airways Status     Active Line/Drains/Airways     Name Placement date Placement time Site Days   Peripheral IV 08/30/22 20 G Right Antecubital 08/30/22  2022  Antecubital  1   External Urinary Catheter 08/30/22  2352  --  1            Intake/Output Last 24 hours No intake or output  data in the 24 hours ending 08/31/22 0205  Labs/Imaging Results for orders placed or performed during the hospital encounter of 08/30/22 (from the past 48 hour(s))  Comprehensive metabolic panel     Status: Abnormal   Collection Time: 08/30/22  8:23 PM  Result Value Ref Range   Sodium 138 135 - 145 mmol/L   Potassium 4.2 3.5 - 5.1 mmol/L   Chloride 103 98 - 111 mmol/L   CO2 26 22 - 32 mmol/L   Glucose, Bld 133 (H) 70 - 99 mg/dL    Comment: Glucose reference range applies only to samples taken after fasting for at least 8 hours.   BUN 31 (H) 8 - 23 mg/dL   Creatinine, Ser 1.36 (H) 0.61 - 1.24  mg/dL   Calcium 8.8 (L) 8.9 - 10.3 mg/dL   Total Protein 6.2 (L) 6.5 - 8.1 g/dL   Albumin 3.4 (L) 3.5 - 5.0 g/dL   AST 24 15 - 41 U/L   ALT 15 0 - 44 U/L   Alkaline Phosphatase 61 38 - 126 U/L   Total Bilirubin 0.2 (L) 0.3 - 1.2 mg/dL   GFR, Estimated 48 (L) >60 mL/min    Comment: (NOTE) Calculated using the CKD-EPI Creatinine Equation (2021)    Anion gap 9 5 - 15    Comment: Performed at [facility], [place] [address] [place], [place] [address]  CBC     Status: Abnormal   Collection Time: 08/30/22  8:23 PM  Result Value Ref Range   WBC 10.8 (H) 4.0 - 10.5 K/uL   RBC 3.40 (L) 4.22 - 5.81 MIL/uL   Hemoglobin 10.3 (L) 13.0 - 17.0 g/dL   HCT 31.9 (L) 39.0 - 52.0 %   MCV 93.8 80.0 - 100.0 fL   MCH 30.3 26.0 - 34.0 pg   MCHC 32.3 30.0 - 36.0 g/dL   RDW 14.8 11.5 - 15.5 %   Platelets 182 150 - 400 K/uL   nRBC 0.0 0.0 - 0.2 %    Comment: Performed at [facility], [place] [address] [place], [place] [address]  Ethanol     Status: None   Collection Time: 08/30/22  8:23 PM  Result Value Ref Range   Alcohol, Ethyl (B) <10 <10 mg/dL    Comment: (NOTE) Lowest detectable limit for serum alcohol is 10 mg/dL.  For medical purposes only. Performed at [facility], [place] [address], [place], [place] [address]   Protime-INR     Status: Abnormal   Collection Time: 08/30/22  8:23 PM  Result Value Ref Range   Prothrombin Time 18.9 (H) 11.4 - 15.2 seconds   INR 1.6 (H) 0.8 - 1.2    Comment: (NOTE) INR goal varies based on device and disease states. Performed at [facility], [place] [address], [place], [place] [address]   CK     Status: None   Collection Time: 08/30/22  8:23 PM  Result Value Ref Range   Total CK 119 49 - 397 U/L    Comment: Performed at [facility], [place] [address] [place], [place] [address]  I-Stat Chem 8, ED     Status: Abnormal   Collection Time: 08/30/22  8:28 PM  Result Value Ref Range   Sodium 139 135 - 145 mmol/L   Potassium 4.3 3.5 -  5.1 mmol/L   Chloride 102 98 - 111 mmol/L   BUN 30 (H) 8 - 23 mg/dL   Creatinine, Ser  1.40 (H) 0.61 - 1.24 mg/dL   Glucose, Bld 125 (H) 70 - 99 mg/dL    Comment: Glucose reference range applies only to samples taken after fasting for at least 8 hours.   Calcium, Ion 1.17 1.15 - 1.40 mmol/L   TCO2 25 22 - 32 mmol/L   Hemoglobin 10.9 (L) 13.0 - 17.0 g/dL   HCT 32.0 (L) 39.0 - 52.0 %  Urinalysis, Routine w reflex microscopic -Urine, Clean Catch     Status: Abnormal   Collection Time: 08/31/22 12:09 AM  Result Value Ref Range   Color, Urine YELLOW YELLOW   APPearance CLOUDY (A) CLEAR   Specific Gravity, Urine 1.018 1.005 - 1.030   pH 8.0 5.0 - 8.0   Glucose, UA NEGATIVE NEGATIVE mg/dL   Hgb urine dipstick NEGATIVE NEGATIVE   Bilirubin Urine NEGATIVE NEGATIVE   Ketones, ur 5 (A) NEGATIVE mg/dL   Protein, ur NEGATIVE NEGATIVE mg/dL   Nitrite NEGATIVE NEGATIVE   Leukocytes,Ua MODERATE (A) NEGATIVE   RBC / HPF 0-5 0 - 5 RBC/hpf   WBC, UA 21-50 0 - 5 WBC/hpf   Bacteria, UA RARE (A) NONE SEEN   Squamous Epithelial / HPF 0-5 0 - 5 /HPF   Mucus PRESENT    Triple Phosphate Crystal PRESENT     Comment: Performed at [facility], [address], [place], [place] [address]   CT Hip Right Wo Contrast  Result Date: 08/30/2022 CLINICAL DATA:  Hip trauma with fracture suspected. X-ray done. Mechanical fall. Level 2. EXAM: CT OF THE RIGHT HIP WITHOUT CONTRAST TECHNIQUE: Multidetector CT imaging of the right hip was performed according to the standard protocol. Multiplanar CT image reconstructions were also generated. RADIATION DOSE REDUCTION: This exam was performed according to the departmental dose-optimization program which includes automated exposure control, adjustment of the mA and/or kV according to patient size and/or use of iterative reconstruction technique. COMPARISON:  Pelvis radiographs 08/30/2022 FINDINGS: Bones/Joint/Cartilage Degenerative changes in the right hip with narrowing and  sclerosis of the acetabular joint and small osteophyte formation. No evidence of acute fracture or dislocation. Visualized portion of the right hemipelvis and right sacrum are unremarkable. Ligaments Suboptimally assessed by CT. Muscles and Tendons Fatty atrophy of the hip musculature. Mild expansion and increased density in the inferior aspect of the gluteus maximus muscles suggesting intramuscular hematoma measuring up to about 5 cm in diameter. Soft tissues Soft tissue swelling and infiltration in the subcutaneous fat lateral and posterior to the right hip likely representing contusion. No loculated soft tissue collections are otherwise demonstrated. Incidental note of seed implants in the prostate gland. IMPRESSION: 1. No acute displaced fractures demonstrated in the right hip. 2. Degenerative changes in the right hip. 3. Soft tissue changes consistent with intramuscular hematoma in the gluteus maximus muscle and soft tissue contusion in the surrounding fat. Electronically Signed   By: Lucienne Capers M.D.   On: 08/30/2022 22:45   CT LUMBAR SPINE WO CONTRAST  Result Date: 08/30/2022 CLINICAL DATA:  Fall. EXAM: CT THORACIC AND LUMBAR SPINE WITHOUT CONTRAST TECHNIQUE: Multidetector CT imaging of the thoracic and lumbar spine was performed without contrast. Multiplanar CT image reconstructions were also generated. RADIATION DOSE REDUCTION: This exam was performed according to the departmental dose-optimization program which includes automated exposure control, adjustment of the mA and/or kV according to patient size and/or use of iterative reconstruction technique. COMPARISON:  Chest radiograph dated 06/29/2022. FINDINGS: CT THORACIC SPINE FINDINGS Alignment: No acute subluxation. Vertebrae: Evaluation for fracture is limited  due to osteopenia. There is a nondisplaced corner fracture of the anterior aspect of superior endplate of T3 (sagittal 59/13). There is compression fracture of the T6 vertebra with  fracture line extending to the anterior cortex and superior endplate. This is likely acute. Correlation with point tenderness recommended. No significant loss of vertebral body height. No retropulsion. Paraspinal and other soft tissues: No perispinal fluid collection or hematoma. Left lung base atelectasis/scarring. Partially visualized small pericardial effusion measuring up to 13 mm in thickness. Disc levels: No acute findings.  Degenerative changes. CT LUMBAR SPINE FINDINGS Segmentation: 5 lumbar type vertebrae. Alignment: No acute subluxation. Vertebrae: No acute fracture.  Osteopenia. Paraspinal and other soft tissues: No paraspinal fluid collection or hematoma. Atherosclerotic calcification of the aorta. Sigmoid diverticulosis. Disc levels: No acute findings.  Degenerative changes. IMPRESSION: 1. Acute appearing compression fracture of the T6 vertebra. 2. Acute nondisplaced corner fracture of the anterior aspect of the superior endplate of T3. 3. No acute/traumatic lumbar spine pathology. 4. Partially visualized small pericardial effusion. 5.  Aortic Atherosclerosis (ICD10-I70.0). Electronically Signed   By: Anner Crete M.D.   On: 08/30/2022 21:31   CT THORACIC SPINE WO CONTRAST  Result Date: 08/30/2022 CLINICAL DATA:  Fall. EXAM: CT THORACIC AND LUMBAR SPINE WITHOUT CONTRAST TECHNIQUE: Multidetector CT imaging of the thoracic and lumbar spine was performed without contrast. Multiplanar CT image reconstructions were also generated. RADIATION DOSE REDUCTION: This exam was performed according to the departmental dose-optimization program which includes automated exposure control, adjustment of the mA and/or kV according to patient size and/or use of iterative reconstruction technique. COMPARISON:  Chest radiograph dated 06/29/2022. FINDINGS: CT THORACIC SPINE FINDINGS Alignment: No acute subluxation. Vertebrae: Evaluation for fracture is limited due to osteopenia. There is a nondisplaced corner fracture  of the anterior aspect of superior endplate of T3 (sagittal 59/13). There is compression fracture of the T6 vertebra with fracture line extending to the anterior cortex and superior endplate. This is likely acute. Correlation with point tenderness recommended. No significant loss of vertebral body height. No retropulsion. Paraspinal and other soft tissues: No perispinal fluid collection or hematoma. Left lung base atelectasis/scarring. Partially visualized small pericardial effusion measuring up to 13 mm in thickness. Disc levels: No acute findings.  Degenerative changes. CT LUMBAR SPINE FINDINGS Segmentation: 5 lumbar type vertebrae. Alignment: No acute subluxation. Vertebrae: No acute fracture.  Osteopenia. Paraspinal and other soft tissues: No paraspinal fluid collection or hematoma. Atherosclerotic calcification of the aorta. Sigmoid diverticulosis. Disc levels: No acute findings.  Degenerative changes. IMPRESSION: 1. Acute appearing compression fracture of the T6 vertebra. 2. Acute nondisplaced corner fracture of the anterior aspect of the superior endplate of T3. 3. No acute/traumatic lumbar spine pathology. 4. Partially visualized small pericardial effusion. 5.  Aortic Atherosclerosis (ICD10-I70.0). Electronically Signed   By: Anner Crete M.D.   On: 08/30/2022 21:31   CT HEAD WO CONTRAST  Result Date: 08/30/2022 CLINICAL DATA:  Trauma fall EXAM: CT HEAD WITHOUT CONTRAST CT CERVICAL SPINE WITHOUT CONTRAST TECHNIQUE: Multidetector CT imaging of the head and cervical spine was performed following the standard protocol without intravenous contrast. Multiplanar CT image reconstructions of the cervical spine were also generated. RADIATION DOSE REDUCTION: This exam was performed according to the departmental dose-optimization program which includes automated exposure control, adjustment of the mA and/or kV according to patient size and/or use of iterative reconstruction technique. COMPARISON:  CT brain and  cervical spine 06/28/2022 FINDINGS: CT HEAD FINDINGS Brain: No acute territorial infarction, hemorrhage or intracranial mass. Chronic left  greater than right occipital infarcts. Atrophy. Mild chronic small vessel ischemic changes of the white matter. Stable ventricle size Vascular: No hyperdense vessels.  Carotid vascular calcification. Skull: Normal. Negative for fracture or focal lesion. Sinuses/Orbits: No acute finding. Other: Small left posterior scalp hematoma. Small fluid in the inferior mastoids CT CERVICAL SPINE FINDINGS Alignment: No subluxation.  Facet alignment is within normal limits. Skull base and vertebrae: No acute fracture. No primary bone lesion or focal pathologic process. Soft tissues and spinal canal: No prevertebral fluid or swelling. No visible canal hematoma. Disc levels: Multilevel degenerative changes, worst at C5-C6 and C6-C7. Hypertrophic facet degenerative changes at multiple levels. Upper chest: Negative. Other: None IMPRESSION: No CT evidence for acute intracranial abnormality. Atrophy and chronic small vessel ischemic changes of the white matter. 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 Degenerative changes of the cervical spine. No acute osseous abnormality. Electronically Signed   By: Donavan Foil M.D.   On: 08/30/2022 21:27   CT CERVICAL SPINE WO CONTRAST  Result Date: 08/30/2022 CLINICAL DATA:  Trauma fall EXAM: CT HEAD WITHOUT CONTRAST CT CERVICAL SPINE WITHOUT CONTRAST TECHNIQUE: Multidetector CT imaging of the head and cervical spine was performed following the standard protocol without intravenous contrast. Multiplanar CT image reconstructions of the cervical spine were also generated. RADIATION DOSE REDUCTION: This exam was performed according to the departmental dose-optimization program which includes automated exposure control, adjustment of the mA and/or kV according to patient size and/or use of iterative reconstruction technique. COMPARISON:  CT brain and  cervical spine 06/28/2022 FINDINGS: CT HEAD FINDINGS Brain: No acute territorial infarction, hemorrhage or intracranial mass. 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 Atrophy. Mild chronic small vessel ischemic changes of the white matter. Stable ventricle size Vascular: No hyperdense vessels.  Carotid vascular calcification. Skull: Normal. Negative for fracture or focal lesion. Sinuses/Orbits: No acute finding. Other: Small left posterior scalp hematoma. Small fluid in the inferior mastoids CT CERVICAL SPINE FINDINGS Alignment: No subluxation.  Facet alignment is within normal limits. Skull base and vertebrae: No acute fracture. No primary bone lesion or focal pathologic process. Soft tissues and spinal canal: No prevertebral fluid or swelling. No visible canal hematoma. Disc levels: Multilevel degenerative changes, worst at C5-C6 and C6-C7. Hypertrophic facet degenerative changes at multiple levels. Upper chest: Negative. Other: None IMPRESSION: No CT evidence for acute intracranial abnormality. Atrophy and chronic small vessel ischemic changes of the white matter. 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 Degenerative changes of the cervical spine. No acute osseous abnormality. Electronically Signed   By: Donavan Foil M.D.   On: 08/30/2022 21:27   DG Shoulder Left Port  Result Date: 08/30/2022 CLINICAL DATA:  Blunt trauma, fall with left shoulder pain EXAM: LEFT SHOULDER COMPARISON:  None Available. FINDINGS: Mild degenerative AC joint spurring. Mild spurring of the inferior glenoid and humeral head. Subacromial morphology is type 2 (curved). No fracture or malalignment observed. IMPRESSION: 1. Mild degenerative findings in the shoulder. No fracture or dislocation identified. Electronically Signed   By: Van Clines M.D.   On: 08/30/2022 21:02   DG Pelvis Portable  Result Date: 08/30/2022 CLINICAL DATA:  Fall, right hip pain EXAM: PORTABLE PELVIS 1-2 VIEWS COMPARISON:  03/28/2022  MRI of the right hip FINDINGS: Brachytherapy seed implants in the prostate gland. Mild degenerative hip arthropathy bilaterally including articular space narrowing and mild spurring of the femoral heads. No pelvic fracture or visible acute bony finding. Prominent stool throughout the colon favors constipation. IMPRESSION: 1. No pelvic fracture  or visible acute bony finding. 2. Mild degenerative hip arthropathy bilaterally. 3. Prominent stool throughout the colon favors constipation. Electronically Signed   By: Van Clines M.D.   On: 08/30/2022 21:00   DG Chest Port 1 View  Result Date: 08/30/2022 CLINICAL DATA:  Mechanical fall, left thoracic pain EXAM: PORTABLE CHEST 1 VIEW COMPARISON:  06/28/2022 FINDINGS: Mild to moderate enlargement of the cardiopericardial silhouette, stable. No edema. No blunting of the costophrenic angles. The lungs appear clear. No pneumothorax or well-defined rib discontinuity. IMPRESSION: 1. No acute findings. 2. Mild to moderate enlargement of the cardiopericardial silhouette, without edema. Electronically Signed   By: Van Clines M.D.   On: 08/30/2022 20:58    Pending Labs Unresulted Labs (From admission, onward)     Start     Ordered   08/31/22 0500  CBC  Tomorrow morning,   R        08/31/22 0121   08/31/22 1610  Basic metabolic panel  Tomorrow morning,   R        08/31/22 0121   08/30/22 2017  Lactic acid, plasma  (Trauma Panel)  Once,   STAT        08/30/22 2018   08/30/22 2017  Sample to Blood Bank  (Trauma Panel)  Once,   URGENT        08/30/22 2018            Vitals/Pain Today's Vitals   08/31/22 0100 08/31/22 0115 08/31/22 0130 08/31/22 0147  BP: 131/85 (!) 177/72 133/74   Pulse: 64 69 (!) 28   Resp: '18 15 19   '$ Temp:    98.7 F (37.1 C)  TempSrc:    Oral  SpO2: 97% 99% 98%   Weight:      Height:      PainSc:        Isolation Precautions No active isolations  Medications Medications  0.9 %  sodium chloride infusion (  Intravenous New Bag/Given 08/31/22 0003)  senna-docusate (Senokot-S) tablet 1 tablet (1 tablet Oral Not Given 08/31/22 0146)  acetaminophen (TYLENOL) tablet 650 mg (has no administration in time range)  HYDROcodone-acetaminophen (NORCO/VICODIN) 5-325 MG per tablet 1 tablet (has no administration in time range)  lactated ringers infusion ( Intravenous Not Given 08/31/22 0145)  hydrALAZINE (APRESOLINE) injection 5 mg (has no administration in time range)  sodium chloride 0.9 % bolus 1,000 mL (0 mLs Intravenous Stopped 08/30/22 2352)    Mobility walks with device     Focused Assessments Cardiac Assessment Handoff:  Cardiac Rhythm: Normal sinus rhythm Lab Results  Component Value Date   CKTOTAL 119 08/30/2022   No results found for: "DDIMER" Does the Patient currently have chest pain? No   , Neuro Assessment Handoff:  Swallow screen pass? Yes  Cardiac Rhythm: Normal sinus rhythm       Neuro Assessment:   Neuro Checks:      Has TPA been given? No If patient is a Neuro Trauma and patient is going to OR before floor call report to [place] nurse: [phone] or [phone]   R Recommendations: See Admitting Provider Note  Report given to:   Additional Notes:

## 2022-08-31 NOTE — Assessment & Plan Note (Signed)
-  Hgb downward trended from 12 to 10.3. He is on Eliquis with scalp and gluteus maximus hematoma following mechanical fall. Will hold Eliquis for now.

## 2022-08-31 NOTE — Assessment & Plan Note (Addendum)
-  elevated secondary to pain -continue pain control  -PRN hydralazine with perimeters

## 2022-08-31 NOTE — Progress Notes (Signed)
PROGRESS NOTE  Justin Potter  DOB: March 26, 1926  PCP: [name], MD GGY:[id number]  DOA: 08/30/2022  LOS: 0 days  Hospital Day: 2  Brief narrative: Justin Potter is a 89 y.o. male with PMH significant for HTN, HLD, slow A-fib on Eliquis, TIA, history of prostate cancer, macular degeneration and frequent falls who uses a walker at baseline and lives at [place].  At baseline, is able to feed and get dressed by himself. 2/7, patient was brought to the ED after a fall and multiple areas of pain Patient has been having recurrent falls for the last 2 months.  He apparently fell 2 days ago while trying to get out of his bed and his feet became entangled and he fell to the ground. Since then he started having pain at multiple sites including right hip, left shoulder, back of the head and subsequently presented to the ED on 2/7.  In the ED, patient was afebrile, blood pressure initially was elevated as high as 200/91 Labs with WC count 10.8, hemoglobin 10.3, creatinine mildly elevated 1.4 CT head showed multiple small left posterior scalp hematoma.   CT thoracic spine showed acute nondisplaced T3 fracture and T6 fracture. Right hip imaging showed intramuscular hematoma of the gluteus maximus and soft tissue contusion. Admitted to [facility]  Subjective: Patient was seen and examined this morning.  Pleasant elderly Caucasian male.  Propped up in bed.  Pain controlled at rest. No family at bedside. Chart reviewed  afebrile, blood pressure improved this morning to 130s Repeat labs this morning showed improvement in creatinine to normal, hemoglobin stable, WBC count slightly up to 12.2  Assessment and plan: Closed T6 spinal fracture  Closed T3 non-displaced fracture Plan for conservative management with TLSO brace, pain meds If pain control is not adequate, may benefit from kyphoplasty. PT/OT eval ordered.   Posterior Scalp hematoma Intramuscular hematoma of the  gluteus maximus and soft tissue contusion Continue pain management and supportive care. Eliquis on hold  Recurrent falls with fractures Loss of balance secondary to advanced age, history of TIA, macular degeneration At baseline uses a walker.  PT eval ordered. Eliquis on hold for now   Persistent atrial fibrillation  Slow A-fib, not on any AV nodal blocking agent Patient has been on Eliquis for anticoagulation.  Currently on hold. I discussed with patient's son Kanai Berrios who is a retired [employer] manager.  He stated that patient had a stroke last year 3 days after stopping Eliquis.  All decision was made at that time to put him back on Eliquis.  He is concerned about another stroke.  He would like to resume Eliquis if possible.  Will plan to resume Eliquis once a decision on kyphoplasty is made.  Essential hypertension Initial blood pressure was significantly elevated to 200.  Primarily due to pain. PTA not on any antihypertensive.  Blood pressure normal this morning. PRN hydralazine with perimeters    Mild anemia Likely due to blood loss from fracture.  Continue to monitor.  Recent Labs    06/28/22 2216 06/28/22 2235 08/30/22 2023 08/30/22 2028 08/31/22 0307  HGB 12.2* 13.3 10.3* 10.9* 10.4*  MCV 94.9  --  93.8  --  93.4    Leukocytosis likely reactive.  No evidence of infection. Recent Labs  Lab 08/30/22 2023 08/31/22 0307  WBC 10.8* 12.2*  LATICACIDVEN  --  1.0   AKI (acute kidney injury)  mild AKI with creatinine elevated 1.4 from prior of 1. Improved with  IV hydration. Recent Labs    03/15/22 0941 03/20/22 0723 03/26/22 1207 03/28/22 0915 05/21/22 1859 05/22/22 0005 06/28/22 2216 06/28/22 2235 08/30/22 2023 08/30/22 2028 08/31/22 0307  BUN 19 25* 21 24* 28*  --  17 19 31* 30* 28*  CREATININE 0.97 0.79 0.90 1.04 1.04 0.98 1.02 1.00 1.36* 1.40* 0.96     Mobility: PT/OT eval ordered  Goals of care   Code Status: DNR     DVT prophylaxis:   SCDs Start: 08/31/22 0121   Antimicrobials: None Fluid: Currently on NS at 100 mill per hour.  Will continue it for next 24 hours. Consultants: None Family Communication: Called and discussed with patient's son this morning.  Status is: Observation Level of care: Telemetry Medical   Dispo: Patient is from: Retirement community              Anticipated d/c is to: Back to time currently versus SNF.  Pending PT eval Continue in-hospital care because: Pending PT eval.  Inadequate pain control   Scheduled Meds:  senna-docusate  1 tablet Oral QHS    PRN meds: acetaminophen, hydrALAZINE   Infusions:   sodium chloride      Diet:  Diet Order             Diet regular Room service appropriate? Yes; Fluid consistency: Thin  Diet effective now                   Antimicrobials: Anti-infectives (From admission, onward)    None       Skin assessment:       Nutritional status:  Body mass index is 21.83 kg/m.          Objective: Vitals:   08/31/22 0820 08/31/22 1111  BP: 130/77 (!) 147/86  Pulse: 63 (!) 58  Resp: 18 19  Temp: 97.8 F (36.6 C) 97.7 F (36.5 C)  SpO2: 98% 99%   No intake or output data in the 24 hours ending 08/31/22 1140 Filed Weights   08/30/22 2022  Weight: 77.1 kg   Weight change:  Body mass index is 21.83 kg/m.   Physical Exam: General exam: Pleasant, elderly Caucasian male.  Not in pain at rest but very weak Skin: No rashes, lesions or ulcers. HEENT: Atraumatic, normocephalic, no obvious bleeding Lungs: Clear to auscultation bilaterally CVS: Regular rate and rhythm, no murmur GI/Abd soft, nontender, nondistended, bowel sound present CNS: Alert, awake, oriented x 3 Psychiatry: Sad affect Extremities: No pedal edema, no calf tenderness  Data Review: I have personally reviewed the laboratory data and studies available.  F/u labs ordered Unresulted Labs (From admission, onward)    None       Total time spent in  review of labs and imaging, patient evaluation, formulation of plan, documentation and communication with family: 65 minutes  Signed, Terrilee Croak, MD [facility] 08/31/2022

## 2022-08-31 NOTE — H&P (Signed)
History and Physical    Patient: Justin Potter OKH:[id number] DOB: [birth year] DOA: 08/30/2022 DOS: the patient was seen and examined on 08/31/2022 PCP: [name], MD  Patient coming from: ALF/ILF  Chief Complaint:  Chief Complaint  Patient presents with   Fall   HPI: Justin Potter is a 89 y.o. male with medical history significant of prostate cancer s/p seed implant, permanent atrial fibrillation on Eliquis, TIA, hypertension hyperlipidemia who presents following a mechanical fall.  Patient presented to ED with pain to his right hip, left shoulder and back of his head after mechanical fall after he attempted to get off the toilet. Says he has had recurrent falls in the past few months. At baseline he ambulates with walker. He lives alone in retirement community where he gets assistance with cooking, bathing and medication. He is able to feed and get dressed by himself.   In the ED, he was afebrile with BP up to 200/91 on room air.  WBC elevated at 10.8, hemoglobin downward trended to 10.3 from prior of 12.  Has mild AKI with creatinine elevated 1.4 from prior of 1.  CT head shows multiple small left posterior scalp hematoma.  Right hip imaging revealing for intramuscular hematoma of the gluteus maximus and soft tissue contusion. CT thoracic spine also revealing for nondisplaced T3 fracture and T6 fracture.  Hospitalist was then consulted for further management.   Review of Systems: As mentioned in the history of present illness. All other systems reviewed and are negative. Past Medical History:  Diagnosis Date   Anxiety    Arthritis    "in the fingers" per pt   Atrial fibrillation ([place])    Bladder neck contracture    Dysrhythmia    A-FIB / HX BRADYCARDIA - FOLLOWED BY DR. Ingalls DUE TO [name]   Gross hematuria    History of gout    History of prostate cancer    S/P RADIOACTIVE SEED IMPLANTS   History of squamous cell carcinoma of skin     EXCISION LOWER LIP AND RIGHT EAR   History of TIA (transient ischemic attack)    probable tia no residual  09-09-2012   Hyperlipidemia    Hypertension    Macular degeneration    both eyes   Urethral stricture    Urinary retention    Past Surgical History:  Procedure Laterality Date   APPENDECTOMY     CATARACT EXTRACTION W/ INTRAOCULAR LENS  IMPLANT, BILATERAL     CHOLECYSTECTOMY  1977   CYSTOSCOPY WITH URETHRAL DILATATION N/A 02/12/2013   Procedure: CYSTOSCOPY WITH BALLOON DILATATION OF URETHRAL STRICTURE AND BLADDER FULGERATION;  Surgeon: Bernestine Amass, MD;  Location: [facility];  Service: Urology;  Laterality: N/A;   ESOPHAGOGASTRODUODENOSCOPY (EGD) WITH PROPOFOL N/A 11/02/2020   Procedure: ESOPHAGOGASTRODUODENOSCOPY (EGD) WITH PROPOFOL;  Surgeon: Clarene Essex, MD;  Location: [facility];  Service: Endoscopy;  Laterality: N/A;   LAPAROSCOPIC APPENDECTOMY N/A 08/22/2013   Procedure: APPENDECTOMY LAPAROSCOPIC;  Surgeon: Pedro Earls, MD;  Location: [facility];  Service: General;  Laterality: N/A;   [place] N/A 11/02/2020   Procedure: SAVORY DILATION;  Surgeon: Clarene Essex, MD;  Location: [facility];  Service: Endoscopy;  Laterality: N/A;   TONSILLECTOMY  as child   TOTAL KNEE ARTHROPLASTY Right 11-23-2004   Social History:  reports that he quit smoking about 43 years ago. His smoking use included cigarettes. He has a 52.50 pack-year smoking  history. He has never used smokeless tobacco. He reports current alcohol use. He reports that he does not use drugs.  No Known Allergies  Family History  Problem Relation Age of Onset   Cancer Mother    Cancer Father     Prior to Admission medications   Medication Sig Start Date End Date Taking? Authorizing Provider  acetaminophen (TYLENOL) 325 MG tablet Take 2 tablets (650 mg total) by mouth every 6 (six) hours as needed for mild pain (or Fever >/= 101). Patient not taking: Reported on  06/29/2022 03/23/22   Angiulli, Lavon Paganini, PA-C  acetaminophen (TYLENOL) 500 MG tablet Take 1,000 mg by mouth every 8 (eight) hours as needed for mild pain.    [provider]  ELIQUIS 5 MG TABS tablet Take 1 tablet (5 mg total) by mouth 2 (two) times daily. 03/23/22   Angiulli, Lavon Paganini, PA-C  Multiple Vitamins-Minerals (ICAPS AREDS 2 PO) Take 1 tablet by mouth daily.    [provider]  Pediatric Multiple Vit-C-FA (ANIMAL CHEWABLE MULTIVITAMIN PO) Take 1 tablet by mouth daily.    [provider]  Pediatric Multivit-Minerals (FLINTSTONES COMPLETE) CHEW Chew 1 tablet by mouth daily.    [provider]    Physical Exam: Vitals:   08/30/22 2022 08/30/22 2030 08/30/22 2127 08/30/22 2245  BP:  (!) 200/91  (!) 178/87  Pulse:  80  67  Resp:  16  18  Temp:   98.6 F (37 C)   TempSrc:   Oral   SpO2:  100%  99%  Weight: 77.1 kg     Height: '6\' 2"'$  (1.88 m)      Constitutional: NAD, calm, comfortable, elderly male laying flat in bed Eyes: lids and conjunctivae normal ENMT: Mucous membranes are moist.  Neck: normal, supple Respiratory: clear to auscultation bilaterally, no wheezing, no crackles. Normal respiratory effort. No accessory muscle use.  Cardiovascular: Regular rate and rhythm, no murmurs / rubs / gallops. No extremity edema. 2+ pedal pulses.  Abdomen: soft, no tenderness, Bowel sounds positive.  Musculoskeletal: no clubbing / cyanosis. No joint deformity upper and lower extremities. Good ROM, no contractures. Normal muscle tone.  Skin: no rashes, lesions, ulcers. No induration Neurologic: CN 2-12 grossly intact. Sensation intact. Difficulty lifting all 4 extremities against gravity but not against resistance. Psychiatric: Normal judgment and insight. Alert and oriented x 3. Normal mood. Data Reviewed:  See HPI  Assessment and Plan: * Closed T6 spinal fracture (HCC) Closed T3 non-displaced fracture -apply TLSO brace -PRN pain management -will need  outpatient neurosurgery follow up  Persistent atrial fibrillation ([place]) -holding Eliquis for now with downward trended anemia and hematoma  Essential hypertension -elevated secondary to pain -continue pain control  -PRN hydralazine with perimeters   Anemia -Hgb downward trended from 12 to 10.3. He is on Eliquis with scalp and gluteus maximus hematoma following mechanical fall. Will hold Eliquis for now.   Leukocytosis -likely reactive   AKI (acute kidney injury) ([place]) -mild AKI with creatinine elevated 1.4 from prior of 1. -IV fluid overnight  Hematoma and contusion Posterior Scalp hematoma ntramuscular hematoma of the gluteus maximus and soft tissue contusion -continue to follow anemia. Holding anticoagulation.       Advance Care Planning:   Code Status: DNR   Consults: None  Family Communication: pt deferred updates to Son overnight stating he can be updated in the morning  Severity of Illness: The appropriate patient status for this patient is OBSERVATION. Observation status is judged to  be reasonable and necessary in order to provide the required intensity of service to ensure the patient's safety. The patient's presenting symptoms, physical exam findings, and initial radiographic and laboratory data in the context of their medical condition is felt to place them at decreased risk for further clinical deterioration. Furthermore, it is anticipated that the patient will be medically stable for discharge from the hospital within 2 midnights of admission.   Author: Orene Desanctis, DO 08/31/2022 1:25 AM  For on call review [email or web address].

## 2022-08-31 NOTE — Assessment & Plan Note (Signed)
-  holding Eliquis for now with downward trended anemia and hematoma

## 2022-08-31 NOTE — Plan of Care (Signed)

## 2022-08-31 NOTE — Assessment & Plan Note (Signed)
Posterior Scalp hematoma ntramuscular hematoma of the gluteus maximus and soft tissue contusion -continue to follow anemia. Holding anticoagulation.

## 2022-08-31 NOTE — Assessment & Plan Note (Signed)
-  likely reactive °

## 2022-08-31 NOTE — Assessment & Plan Note (Signed)
-  mild AKI with creatinine elevated 1.4 from prior of 1. -IV fluid overnight

## 2022-08-31 NOTE — Progress Notes (Signed)
RN called Ortho tech for TLSO brace.

## 2022-08-31 NOTE — Assessment & Plan Note (Signed)
Closed T3 non-displaced fracture -apply TLSO brace -PRN pain management -will need outpatient neurosurgery follow up

## 2022-09-01 DIAGNOSIS — S22058A Other fracture of T5-T6 vertebra, initial encounter for closed fracture: Secondary | ICD-10-CM | POA: Diagnosis not present

## 2022-09-01 NOTE — Progress Notes (Signed)
   Inpatient Rehab Admissions Coordinator :  Per therapy recommendations, patient was screened for CIR candidacy by Danne Baxter RN MSN.  At this time patient appears to be a potential candidate for CIR. Noted form [facility]. I will place a rehab consult per protocol for full assessment. Please call me with any questions.  Danne Baxter RN MSN Admissions Coordinator [phone]

## 2022-09-01 NOTE — Evaluation (Addendum)
Physical Therapy Evaluation Patient Details Name: Justin Potter MRN: [id number] DOB: [birth year] Today's Date: 09/01/2022  History of Present Illness  89yo male who was brought to the ED on 08/30/22 after a fall at home. Has been having recurrent falls for past 2 months. CT thoracic spine shows acute T3 and T6 fractures. R hip imaging with intramuscular hematoma in glute max. PMH Afib, gout, prostate CA, TIA, HLD, HTN, macular degeneration, TKA  Clinical Impression   Patient received in bed, pleasant and cooperative- son present and provided history and recent PLOF. Applied TLSO in the bed via log rolling. He required heavy physical +2 assist for all functional mobility and transfers this afternoon, but did well VSS on room air despite hx of orthostasis. Left up in recliner with chair alarm active and all needs met, son present and nursing staff aware of precautions/best course of mobility. Would really benefit from intensive rehab in AIR setting moving forward.        Recommendations for follow up therapy are one component of a multi-disciplinary discharge planning process, led by the attending physician.  Recommendations may be updated based on patient status, additional functional criteria and insurance authorization.  Follow Up Recommendations Acute inpatient rehab (3hours/day)      Assistance Recommended at Discharge Frequent or constant Supervision/Assistance  Patient can return home with the following  A lot of help with bathing/dressing/bathroom;Direct supervision/assist for medications management;A lot of help with walking and/or transfers;Two people to help with bathing/dressing/bathroom;Two people to help with walking and/or transfers;Help with stairs or ramp for entrance    Equipment Recommendations Other (comment) (defer)  Recommendations for Other Services  Rehab consult    Functional Status Assessment Patient has had a recent decline in their functional status and demonstrates  the ability to make significant improvements in function in a reasonable and predictable amount of time.     Precautions / Restrictions Precautions Precautions: Fall;Other (comment) Precaution Comments: poor vision, watch BP (hx of orthostasis), hx of Afib with regular rate 40-60, R knee can buckle, TLSO on in supine  Restrictions Weight Bearing Restrictions: No      Mobility  Bed Mobility Overal bed mobility: Needs Assistance Bed Mobility: Rolling, Sidelying to Sit Rolling: Mod assist, +2 for physical assistance Sidelying to sit: Max assist, +2 for physical assistance       General bed mobility comments: heavy cues and step by step instructions for sequencing, totalA for TLSO donning    Transfers Overall transfer level: Needs assistance Equipment used: Rolling walker (2 wheels) Transfers: Sit to/from Stand, Bed to chair/wheelchair/BSC Sit to Stand: Mod assist, +2 physical assistance Stand pivot transfers: Mod assist, +2 physical assistance         General transfer comment: ModAx2 for all transfer activity- tends to push RW too far ahead of him and R knee can buckle a bit    Ambulation/Gait               General Gait Details: DNT today  Stairs            Wheelchair Mobility    Modified Rankin (Stroke Patients Only)       Balance Overall balance assessment: Needs assistance, History of Falls Sitting-balance support: Feet supported, Bilateral upper extremity supported Sitting balance-Leahy Scale: Fair     Standing balance support: Bilateral upper extremity supported, During functional activity, Reliant on assistive device for balance Standing balance-Leahy Scale: Poor  Pertinent Vitals/Pain Pain Assessment Pain Assessment: Faces Pain Score: 0-No pain Faces Pain Scale: No hurt    Home Living Family/patient expects to be discharged to:: Other (Comment) (over at [facility] assisted living with [facility] for  rehab)                   Additional Comments: (P) [facility], has life alert button as well as pull cords in rooms.[facility] will not take pt if he cannot transfer and pivot, no hoyer lift or heavy duty DME. Memory comes and goes    Prior Function Prior Level of Function : (P) Needs assist             Mobility Comments: (P) was walking 50 yards with RW with staff with him before this most recent fall; has been getting quite a bit of PT/OT recently. Lots of falls in recent history ADLs Comments: (P) staff assisting with bathing/dressing     Hand Dominance   Dominant Hand: (P) Right    Extremity/Trunk Assessment   Upper Extremity Assessment Upper Extremity Assessment: Defer to OT evaluation    Lower Extremity Assessment Lower Extremity Assessment: Generalized weakness    Cervical / Trunk Assessment Cervical / Trunk Assessment: Kyphotic  Communication   Communication: (P) No difficulties  Cognition Arousal/Alertness: Awake/alert Behavior During Therapy: WFL for tasks assessed/performed Overall Cognitive Status: Within Functional Limits for tasks assessed                                 General Comments: poor safety awareness in general, needed lots of cues for safety        General Comments General comments (skin integrity, edema, etc.): orthostatics negative, HR in normal range per son    Exercises     Assessment/Plan    PT Assessment Patient needs continued PT services  PT Problem List Decreased strength;Decreased cognition;Decreased knowledge of use of DME;Decreased range of motion;Decreased activity tolerance;Decreased safety awareness;Decreased knowledge of precautions;Decreased balance;Decreased mobility;Decreased coordination       PT Treatment Interventions DME instruction;Balance training;Gait training;Neuromuscular re-education;Stair training;Functional mobility training;Patient/family education;Therapeutic  activities;Therapeutic exercise;Manual techniques    PT Goals (Current goals can be found in the Care Plan section)  Acute Rehab PT Goals Patient Stated Goal: rehab PT Goal Formulation: With patient/family Time For Goal Achievement: 09/15/22 Potential to Achieve Goals: Good    Frequency Min 3X/week     Co-evaluation               AM-PAC PT "6 Clicks" Mobility  Outcome Measure Help needed turning from your back to your side while in a flat bed without using bedrails?: Total Help needed moving from lying on your back to sitting on the side of a flat bed without using bedrails?: Total Help needed moving to and from a bed to a chair (including a wheelchair)?: Total Help needed standing up from a chair using your arms (e.g., wheelchair or bedside chair)?: Total Help needed to walk in hospital room?: Total Help needed climbing 3-5 steps with a railing? : Total 6 Click Score: 6    End of Session Equipment Utilized During Treatment: Gait belt;Other (comment) (TLSO) Activity Tolerance: Patient tolerated treatment well Patient left: in chair;with call bell/phone within reach;with chair alarm set;with family/visitor present Nurse Communication: Mobility status;Need for lift equipment;Precautions PT Visit Diagnosis: Unsteadiness on feet (R26.81);Difficulty in walking, not elsewhere classified (R26.2);Repeated falls (R29.6);Muscle weakness (generalized) (M62.81)  Time: [id number] PT Time Calculation (min) (ACUTE ONLY): 47 min   Charges:   PT Evaluation $PT Eval Moderate Complexity: 1 Mod (co-eval with OT) PT Treatments $Therapeutic Activity: 8-22 mins       Deniece Ree PT DPT PN2

## 2022-09-01 NOTE — Plan of Care (Signed)

## 2022-09-01 NOTE — Evaluation (Signed)
Occupational Therapy Evaluation Patient Details Name: Justin Potter MRN: [id number] DOB: January 19, 1926 Today's Date: 09/01/2022   History of Present Illness 89yo male who was brought to the ED on 08/30/22 after a fall at home. Has been having recurrent falls for past 2 months. CT thoracic spine shows acute T3 and T6 fractures. R hip imaging with intramuscular hematoma in glute max. PMH Afib, gout, prostate CA, TIA, HLD, HTN, macular degeneration, TKA   Clinical Impression   PTA, pt lived at [facility] where staff provided supervision during OOB ADL. Pt with multiple recent falls. Presenting with generalized weakness, decreased safety awareness, balance, knowledge of precautions. Pt and son educated regarding spinal precautions during ADL. Pt unable to perform figure 4 at this time, and thus max-total A for LB ADL, max A for donning brace. Recommending AIR to optimize safety and independence in ADL and IADL.      Recommendations for follow up therapy are one component of a multi-disciplinary discharge planning process, led by the attending physician.  Recommendations may be updated based on patient status, additional functional criteria and insurance authorization.   Follow Up Recommendations  Acute inpatient rehab (3hours/day)     Assistance Recommended at Discharge Frequent or constant Supervision/Assistance  Patient can return home with the following Two people to help with walking and/or transfers;Two people to help with bathing/dressing/bathroom;Assistance with cooking/housework;Assist for transportation;Help with stairs or ramp for entrance    Functional Status Assessment  Patient has had a recent decline in their functional status and demonstrates the ability to make significant improvements in function in a reasonable and predictable amount of time.  Equipment Recommendations  Other (comment) (defer)    Recommendations for Other Services       Precautions / Restrictions  Precautions Precautions: Fall;Other (comment);Back Precaution Booklet Issued: No Precaution Comments: poor vision (can see shadows; macular degeneration), watch BP (hx of orthostasis), hx of Afib with regular rate 40-60, R knee can buckle Required Braces or Orthoses: Spinal Brace Spinal Brace: Thoracolumbosacral orthotic (Per MD, When OOB) Restrictions Weight Bearing Restrictions: No      Mobility Bed Mobility Overal bed mobility: Needs Assistance Bed Mobility: Rolling, Sidelying to Sit Rolling: Mod assist, +2 for physical assistance Sidelying to sit: Max assist, +2 for physical assistance       General bed mobility comments: heavy cues and step by step instructions for sequencing, totalA for TLSO donning    Transfers Overall transfer level: Needs assistance Equipment used: Rolling walker (2 wheels) Transfers: Sit to/from Stand, Bed to chair/wheelchair/BSC Sit to Stand: Mod assist, +2 physical assistance Stand pivot transfers: Mod assist, +2 physical assistance         General transfer comment: ModAx2 for all transfer activity- tends to push RW too far ahead of him and R knee can buckle a bit      Balance Overall balance assessment: Needs assistance, History of Falls Sitting-balance support: Feet supported, Bilateral upper extremity supported Sitting balance-Leahy Scale: Fair     Standing balance support: Bilateral upper extremity supported, During functional activity, Reliant on assistive device for balance Standing balance-Leahy Scale: Poor                             ADL either performed or assessed with clinical judgement   ADL Overall ADL's : Needs assistance/impaired Eating/Feeding: Set up;Bed level   Grooming: Bed level;Minimal assistance   Upper Body Bathing: Minimal assistance;Sitting   Lower Body Bathing: Maximal assistance;+2 for  physical assistance;+2 for safety/equipment   Upper Body Dressing : Maximal assistance;Bed level Upper Body  Dressing Details (indicate cue type and reason): to don brace Lower Body Dressing: Total assistance;Sit to/from stand Lower Body Dressing Details (indicate cue type and reason): to don socks Toilet Transfer: Moderate assistance;+2 for physical assistance;+2 for safety/equipment;Stand-pivot;Rolling walker (2 wheels);BSC/3in1 Toilet Transfer Details (indicate cue type and reason): simulated in room Toileting- Clothing Manipulation and Hygiene: Minimal assistance;Sitting/lateral lean       Functional mobility during ADLs: Minimal assistance;+2 for physical assistance;+2 for safety/equipment       Vision Baseline Vision/History: 6 Macular Degeneration Ability to See in Adequate Light: 3 Highly impaired Patient Visual Report: No change from baseline Additional Comments: macular degeneration and just sees shadows at baseline per son     Perception     Praxis      Pertinent Vitals/Pain Pain Assessment Pain Assessment: Faces Faces Pain Scale: Hurts a little bit Pain Location: buttocks Pain Descriptors / Indicators: Sore Pain Intervention(s): Limited activity within patient's tolerance, Monitored during session     Hand Dominance Right   Extremity/Trunk Assessment Upper Extremity Assessment Upper Extremity Assessment: Generalized weakness   Lower Extremity Assessment Lower Extremity Assessment: Defer to PT evaluation   Cervical / Trunk Assessment Cervical / Trunk Assessment: Kyphotic   Communication Communication Communication: No difficulties   Cognition Arousal/Alertness: Awake/alert Behavior During Therapy: WFL for tasks assessed/performed Overall Cognitive Status: Within Functional Limits for tasks assessed                                 General Comments: poor safety awareness in general, needed lots of cues for safety. BElieve this is likely baseline due to hx of many recent falls     General Comments  orthostatics negative; HR in normal range per  son    Exercises     Shoulder Instructions      Home Living Family/patient expects to be discharged to:: Assisted living ([facility] for rehab)                             Home Equipment: Rollator (4 wheels)   Additional Comments: [facility], has life alert button as well as pull cords in rooms.[facility] will not take pt if he cannot transfer and pivot, no hoyer lift or heavy duty DME. Memory comes and goes      Prior Functioning/Environment Prior Level of Function : Needs assist  Cognitive Assist : ADLs (cognitive)   ADLs (Cognitive): Intermittent cues Physical Assist : ADLs (physical)   ADLs (physical): Bathing;Dressing;IADLs (donning compression socks; gets 3 baths a week) Mobility Comments: was walking 50 yards with RW with staff with him before this most recent fall; has been getting quite a bit of PT/OT recently. Lots of falls in recent history ADLs Comments: staff assisting with bathing and supervising with dressing        OT Problem List: Decreased strength;Decreased activity tolerance;Impaired vision/perception;Impaired balance (sitting and/or standing);Decreased safety awareness;Decreased knowledge of use of DME or AE;Decreased knowledge of precautions      OT Treatment/Interventions: Self-care/ADL training;Therapeutic exercise;DME and/or AE instruction;Balance training;Patient/family education;Therapeutic activities;Visual/perceptual remediation/compensation    OT Goals(Current goals can be found in the care plan section) Acute Rehab OT Goals Patient Stated Goal: get better OT Goal Formulation: With patient/family Time For Goal Achievement: 09/15/22 Potential to Achieve Goals: Good  OT  Frequency: Min 2X/week    Co-evaluation PT/OT/SLP Co-Evaluation/Treatment: Yes Reason for Co-Treatment: For patient/therapist safety;To address functional/ADL transfers PT goals addressed during session: Mobility/safety with  mobility;Balance OT goals addressed during session: ADL's and self-care;Proper use of Adaptive equipment and DME      AM-PAC OT "6 Clicks" Daily Activity     Outcome Measure Help from another person eating meals?: A Little Help from another person taking care of personal grooming?: A Little Help from another person toileting, which includes using toliet, bedpan, or urinal?: A Lot Help from another person bathing (including washing, rinsing, drying)?: A Lot Help from another person to put on and taking off regular upper body clothing?: A Lot Help from another person to put on and taking off regular lower body clothing?: Total 6 Click Score: 13   End of Session Equipment Utilized During Treatment: Gait belt;Rolling walker (2 wheels);Back brace Nurse Communication: Mobility status;Need for lift equipment (recommended Stedy for back to bed with nursing staff)  Activity Tolerance: Patient tolerated treatment well Patient left: in chair;with call bell/phone within reach;with chair alarm set;with family/visitor present  OT Visit Diagnosis: Unsteadiness on feet (R26.81);Repeated falls (R29.6);Muscle weakness (generalized) (M62.81);History of falling (Z91.81);Low vision, both eyes (H54.2);Other symptoms and signs involving cognitive function;Pain Pain - part of body:  (back, R knee)                Time: [id number] OT Time Calculation (min): 47 min Charges:  OT General Charges $OT Visit: 1 Visit OT Evaluation $OT Eval Low Complexity: 1 Low  Elder Cyphers, OTR/L [facility] Acute Rehabilitation Office: [phone]   Magnus Ivan 09/01/2022, 4:43 PM

## 2022-09-01 NOTE — Progress Notes (Signed)
PROGRESS NOTE  [name]  DOB: [birth year]  PCP: [name], MD [id number]  DOA: 08/30/2022  LOS: 1 day  Hospital Day: 3  Brief narrative: HASANI MENDELSON is a 89 y.o. male with PMH significant for HTN, HLD, slow A-fib on Eliquis, TIA, history of prostate cancer, macular degeneration and frequent falls who uses a walker at baseline and lives at [facility].  At baseline, is able to feed and get dressed by himself. 2/7, patient was brought to the ED after a fall and multiple areas of pain Patient has been having recurrent falls for the last 2 months.  He apparently fell 2 days ago while trying to get out of his bed and his feet became entangled and he fell to the ground. Since then he started having pain at multiple sites including right hip, left shoulder, back of the head and subsequently presented to the ED on 2/7.  In the ED, patient was afebrile, blood pressure initially was elevated as high as 200/91 Labs with WC count 10.8, hemoglobin 10.3, creatinine mildly elevated 1.4 CT head showed multiple small left posterior scalp hematoma.   CT thoracic spine showed acute nondisplaced T3 fracture and T6 fracture. Right hip imaging showed intramuscular hematoma of the gluteus maximus and soft tissue contusion. Admitted to [facility].  Subjective: Patient was seen and examined this afternoon.  Pleasant, elderly, lying on bed.  Pending PT eval today.   Assessment and plan: Closed T6 spinal fracture  Closed T3 non-displaced fracture Plan for conservative management with TLSO brace, pain meds PT/OT eval pending today. If pain control is not adequate and is affecting his ability to be out of bed, may benefit from kyphoplasty.   Posterior Scalp hematoma Intramuscular hematoma of the gluteus maximus and soft tissue contusion Continue pain management and supportive care. Eliquis on hold  Recurrent falls with fractures Loss of balance secondary to advanced age,  history of TIA, macular degeneration At baseline uses a walker.  PT eval ordered. Eliquis on hold for now   Persistent atrial fibrillation  Slow A-fib, not on any AV nodal blocking agent Patient has been on Eliquis for anticoagulation.  Currently on hold. 2/8, I discussed with patient's son Stephano Cay who is a retired [employer] manager.  He stated that patient had a stroke last year 3 days after stopping Eliquis.  All decision was made at that time to put him back on Eliquis.  He is concerned about another stroke.  He would like to resume Eliquis if possible.  Will plan to resume Eliquis once a decision on kyphoplasty is made.  Essential hypertension Initial blood pressure was significantly elevated to 200.  Primarily due to pain. PTA not on any antihypertensive.  Blood pressure elevated to 160s this morning. PRN hydralazine with perimeters    Mild anemia Likely due to blood loss from fracture.  Continue to monitor.  Recent Labs    06/28/22 2216 06/28/22 2235 08/30/22 2023 08/30/22 2028 08/31/22 0307  HGB 12.2* 13.3 10.3* 10.9* 10.4*  MCV 94.9  --  93.8  --  93.4    Leukocytosis likely reactive.  No evidence of infection. Recent Labs  Lab 08/30/22 2023 08/31/22 0307  WBC 10.8* 12.2*  LATICACIDVEN  --  1.0   AKI (acute kidney injury)  mild AKI with creatinine elevated 1.4 from prior of 1. Improved with IV hydration. Recent Labs    03/15/22 0941 03/20/22 0723 03/26/22 1207 03/28/22 0915 05/21/22 1859 05/22/22 0005 06/28/22 2216 06/28/22  2235 08/30/22 2023 08/30/22 2028 08/31/22 0307  BUN 19 25* 21 24* 28*  --  17 19 31* 30* 28*  CREATININE 0.97 0.79 0.90 1.04 1.04 0.98 1.02 1.00 1.36* 1.40* 0.96     Mobility: PT/OT eval pending today  Goals of care   Code Status: DNR     DVT prophylaxis:  SCDs Start: 08/31/22 0121   Antimicrobials: None Fluid: Can stop IV fluid today Consultants: None Family Communication: Called and discussed with patient's  son this morning.  Status is: Observation Level of care: Telemetry Medical   Dispo: [facility] is from: [facility]              Anticipated d/c is to: Back to time currently versus SNF.  Pending PT eval Continue in-hospital care because: Pending PT eval. may need kyphoplasty   Scheduled Meds:  senna-docusate  1 tablet Oral QHS    PRN meds: acetaminophen, hydrALAZINE   Infusions:   sodium chloride Stopped (09/01/22 0915)    Diet:  Diet Order             Diet regular Room service appropriate? Yes; Fluid consistency: Thin  Diet effective now                   Antimicrobials: Anti-infectives (From admission, onward)    None       Skin assessment:       Nutritional status:  Body mass index is 21.83 kg/m.          Objective: Vitals:   09/01/22 0754 09/01/22 1140  BP: (!) 140/66 (!) 168/87  Pulse: 61 61  Resp: 18 18  Temp: 98.1 F (36.7 C) 98 F (36.7 C)  SpO2: 99% 100%    Intake/Output Summary (Last 24 hours) at 09/01/2022 1312 Last data filed at 09/01/2022 0925 Gross per 24 hour  Intake 3076.05 ml  Output 700 ml  Net 2376.05 ml   Filed Weights   08/30/22 2022  Weight: 77.1 kg   Weight change:  Body mass index is 21.83 kg/m.   Physical Exam: General exam: Pleasant, elderly Caucasian male.  Not in pain at rest but very weak Skin: No rashes, lesions or ulcers. HEENT: Atraumatic, normocephalic, no obvious bleeding Lungs: Clear to auscultation bilaterally CVS: Regular rate and rhythm, no murmur GI/Abd soft, nontender, nondistended, bowel sound present CNS: Alert, awake, oriented x 3 Psychiatry: Sad affect Extremities: No pedal edema, no calf tenderness  Data Review: I have personally reviewed the laboratory data and studies available.  F/u labs ordered Unresulted Labs (From admission, onward)    None       Total time spent in review of labs and imaging, patient evaluation, formulation of plan, documentation and  communication with family: 41 minutes  Signed, Terrilee Croak, MD [facility] 09/01/2022

## 2022-09-01 NOTE — TOC CAGE-AID Note (Signed)
Transition of Care [facility]) - CAGE-AID Screening   Patient Details  Name: Justin Potter MRN: [id number] Date of Birth: 05-Nov-1925  Transition of Care [facility]) CM/SW Contact:    Bethann Berkshire, [place] Phone Number: 09/01/2022, 9:05 AM   CAGE-AID Screening: Substance Abuse Screening unable to be completed due to: : Patient unable to participate

## 2022-09-02 DIAGNOSIS — S22050A Wedge compression fracture of T5-T6 vertebra, initial encounter for closed fracture: Secondary | ICD-10-CM | POA: Diagnosis not present

## 2022-09-02 DIAGNOSIS — T148XXA Other injury of unspecified body region, initial encounter: Secondary | ICD-10-CM | POA: Diagnosis not present

## 2022-09-02 DIAGNOSIS — R296 Repeated falls: Secondary | ICD-10-CM

## 2022-09-02 DIAGNOSIS — N179 Acute kidney failure, unspecified: Secondary | ICD-10-CM | POA: Diagnosis not present

## 2022-09-02 MED ORDER — APIXABAN 5 MG PO TABS
5.0000 mg | ORAL_TABLET | Freq: Two times a day (BID) | ORAL | Status: DC
  Administered 2022-09-02 – 2022-09-06 (×8): 5 mg via ORAL
  Filled 2022-09-02 (×9): qty 1

## 2022-09-02 MED ORDER — HYDRALAZINE HCL 10 MG PO TABS
10.0000 mg | ORAL_TABLET | Freq: Three times a day (TID) | ORAL | Status: DC
  Administered 2022-09-02 – 2022-09-06 (×11): 10 mg via ORAL
  Filled 2022-09-02 (×13): qty 1

## 2022-09-02 NOTE — PMR Pre-admission (Signed)
PMR Admission Coordinator Pre-Admission Assessment  Patient: Justin Potter is an 89 y.o., male MRN: [id number] DOB: [birth year] Height: 6\' 2"  (188 cm) Weight: 77.1 kg  Insurance Information HMO:     PPO:      PCP:      IPA:      80/20: yes     OTHER:  PRIMARY: Medicare A & B      Policy#: [id number]      Subscriber: patient CM Name:       Phone#:      Fax#:  Pre-Cert#:       Employer:  Benefits:  Phone #: verified eligibility via [employer] on 09/02/22     Name:  Eff. Date: Part A & B effective 03/24/93     Deduct: $1,632      Out of Pocket Max: NA      Life Max: NA CIR: 100% coverage      SNF: 100% coverage days 1-20, 80% coverage days 21-100 Outpatient: 80% coverage     Co-Pay: 20% Home Health: 100% coverage      Co-Pay:  DME: 80% coverage     Co-Pay: 20% Providers: pt's choice SECONDARY: BCBS/Federal Emp PPO      Policy#: [id number]     Phone#: [phone]  Financial Counselor:       Phone#:   The "Data Collection Information Summary" for patients in Inpatient Rehabilitation Facilities with attached "Privacy Act Statement-Health Care Records" was provided and verbally reviewed with: {CHL IP Patient Family WN:[id number]}  Emergency Contact Information Contact Information     Name Relation Home Work Mobile   Escalante Son [phone]         Current Medical History  Patient Admitting Diagnosis: T3 and T6 fracture History of Present Illness: Pt is a 89 year old male with medical hx significant for: A-fib, prostate CA s/p seed implant, TIA, HLD, HTN, frequent falls. Pt presented to [facility] on 08/30/22 d/t fall resulting in pain to right hip, left shoulder and back of head.  CT thoracic spine revealed acute nondisplaced T3 and T6 fractures. Hip imaging revealed intramuscular hematoma in gluteus maximus and soft tissue contusion. CT head showed multiple small left posterior scalp hematoma.  No surgical intervention recommended for T3 and T6 fractures. Therapy  evaluations completed and CIR recommended d/t pt's deficits in functional mobility and ability to complete ADLs.    Patient's medical record from [facility] has been reviewed by the rehabilitation admission coordinator and physician.  Past Medical History  Past Medical History:  Diagnosis Date   Anxiety    Arthritis    "in the fingers" per pt   Atrial fibrillation (HCC)    Bladder neck contracture    Dysrhythmia    A-FIB / HX BRADYCARDIA - FOLLOWED BY DR. HENRY SMITH  - INDEROL HELD DUE TO Julieanne Cotton   Gross hematuria    History of gout    History of prostate cancer    S/P RADIOACTIVE SEED IMPLANTS   History of squamous cell carcinoma of skin    EXCISION LOWER LIP AND RIGHT EAR   History of TIA (transient ischemic attack)    probable tia no residual  09-09-2012   Hyperlipidemia    Hypertension    Macular degeneration    both eyes   Urethral stricture    Urinary retention     Has the patient had major surgery during 100 days prior to admission? No  Family History   family history includes  Cancer in his father and mother.  Current Medications  Current Facility-Administered Medications:    acetaminophen (TYLENOL) tablet 650 mg, 650 mg, Oral, Q6H PRN, Tu, Ching T, DO, 650 mg at 09/01/22 2015   apixaban (ELIQUIS) tablet 5 mg, 5 mg, Oral, BID, Rai, Ripudeep K, MD, 5 mg at 09/02/22 1458   hydrALAZINE (APRESOLINE) injection 5 mg, 5 mg, Intravenous, Q6H PRN, Tu, Ching T, DO   hydrALAZINE (APRESOLINE) tablet 10 mg, 10 mg, Oral, Q8H, Rai, Ripudeep K, MD, 10 mg at 09/02/22 1458   senna-docusate (Senokot-S) tablet 1 tablet, 1 tablet, Oral, QHS, Tu, Ching T, DO, 1 tablet at 09/01/22 2015  Patients Current Diet:  Diet Order             Diet regular Room service appropriate? Yes; Fluid consistency: Thin  Diet effective now                   Precautions / Restrictions Precautions Precautions: Fall, Other (comment), Back Precaution Booklet Issued: No Precaution  Comments: poor vision (can see shadows; macular degeneration), watch BP (hx of orthostasis), hx of Afib with regular rate 40-60, R knee can buckle Spinal Brace: Thoracolumbosacral orthotic (Per MD, When OOB) Restrictions Weight Bearing Restrictions: No   Has the patient had 2 or more falls or a fall with injury in the past year? Yes  Prior Activity Level Limited Community (1-2x/wk): MD appointments  Prior Functional Level Self Care: Did the patient need help bathing, dressing, using the toilet or eating? Needed some help  Indoor Mobility: Did the patient need assistance with walking from room to room (with or without device)? Independent  Stairs: Did the patient need assistance with internal or external stairs (with or without device)? Unknown (pt avoids stairs)  Functional Cognition: Did the patient need help planning regular tasks such as shopping or remembering to take medications? Needed some help  Patient Information Are you of Hispanic, Latino/a,or Spanish origin?: A. No, not of Hispanic, Latino/a, or Spanish origin What is your race?: A. White Do you need or want an interpreter to communicate with a doctor or health care staff?: 0. No  Patient's Response To:  Health Literacy and Transportation Is the patient able to respond to health literacy and transportation needs?: Yes Health Literacy - How often do you need to have someone help you when you read instructions, pamphlets, or other written material from your doctor or pharmacy?: Often In the past 12 months, has lack of transportation kept you from medical appointments or from getting medications?: No In the past 12 months, has lack of transportation kept you from meetings, work, or from getting things needed for daily living?: No  Home Assistive Devices / Equipment Home Equipment: Rollator (4 wheels)  Prior Device Use: Indicate devices/aids used by the patient prior to current illness, exacerbation or injury?  Walker  Current Functional Level Cognition  Overall Cognitive Status: Within Functional Limits for tasks assessed Orientation Level: Oriented to person General Comments: poor safety awareness in general, needed lots of cues for safety. BElieve this is likely baseline due to hx of many recent falls    Extremity Assessment (includes Sensation/Coordination)  Upper Extremity Assessment: Generalized weakness  Lower Extremity Assessment: Defer to PT evaluation    ADLs  Overall ADL's : Needs assistance/impaired Eating/Feeding: Set up, Bed level Grooming: Bed level, Minimal assistance Upper Body Bathing: Minimal assistance, Sitting Lower Body Bathing: Maximal assistance, +2 for physical assistance, +2 for safety/equipment Upper Body Dressing : Maximal assistance, Bed  level Upper Body Dressing Details (indicate cue type and reason): to don brace Lower Body Dressing: Total assistance, Sit to/from stand Lower Body Dressing Details (indicate cue type and reason): to don socks Toilet Transfer: Moderate assistance, +2 for physical assistance, +2 for safety/equipment, Stand-pivot, Rolling walker (2 wheels), BSC/3in1 Toilet Transfer Details (indicate cue type and reason): simulated in room Toileting- Clothing Manipulation and Hygiene: Minimal assistance, Sitting/lateral lean Functional mobility during ADLs: Minimal assistance, +2 for physical assistance, +2 for safety/equipment    Mobility  Overal bed mobility: Needs Assistance Bed Mobility: Rolling, Sidelying to Sit Rolling: Mod assist, +2 for physical assistance Sidelying to sit: Max assist, +2 for physical assistance General bed mobility comments: heavy cues and step by step instructions for sequencing, totalA for TLSO donning    Transfers  Overall transfer level: Needs assistance Equipment used: Rolling walker (2 wheels) Transfers: Sit to/from Stand, Bed to chair/wheelchair/BSC Sit to Stand: Mod assist, +2 physical assistance Bed to/from  chair/wheelchair/BSC transfer type:: Stand pivot Stand pivot transfers: Mod assist, +2 physical assistance General transfer comment: ModAx2 for all transfer activity- tends to push RW too far ahead of him and R knee can buckle a bit    Ambulation / Gait / Stairs / Wheelchair Mobility  Ambulation/Gait General Gait Details: DNT today    Posture / Balance Balance Overall balance assessment: Needs assistance, History of Falls Sitting-balance support: Feet supported, Bilateral upper extremity supported Sitting balance-Leahy Scale: Fair Standing balance support: Bilateral upper extremity supported, During functional activity, Reliant on assistive device for balance Standing balance-Leahy Scale: Poor    Special needs/care consideration Skin Abrasion: toe/left; Ecchymosis: hip/right; Hematoma: buttocks/right; Scratch marks: leg/bilateral, Bladder incontinence, External urinary catheter   Previous Home Environment (from acute therapy documentation) Living Arrangements: Other (Comment) (lives at [facility]) Available Help at Discharge: Other (Comment) (staff at [facility]. Son also involved) Type of Home: Assisted living Home Layout: One level Home Access: Level entry Bathroom Shower/Tub: Health visitor: Handicapped height Bathroom Accessibility: Yes How Accessible: Accessible via walker, Accessible via wheelchair Home Care Services: Yes Type of Home Care Services: Home PT, Home OT Home Care Agency (if known): [facility] Additional Comments: [facility], has life alert button as well as pull cords in rooms.[facility] will not take pt if he cannot transfer and pivot, no hoyer lift or heavy duty DME. Memory comes and goes  Discharge Living Setting Plans for Discharge Living Setting: Other (Comment) ([facility]) Type of Home at Discharge: Assisted living Care Facility Name at Discharge: [facility] Discharge Home Layout: One level Discharge Home Access: Level  entry Discharge Bathroom Shower/Tub: Walk-in shower Discharge Bathroom Toilet: Handicapped height Discharge Bathroom Accessibility: Yes How Accessible: Accessible via walker, Accessible via wheelchair Does the patient have any problems obtaining your medications?: No  Social/Family/Support Systems Anticipated Caregiver: staff at [employer] [employer] and son Marlos Anticipated Industrial/product designer Information: Marjorie: [phone] Caregiver Availability: 24/7 Discharge Plan Discussed with Primary Caregiver: Yes Is Caregiver In Agreement with Plan?: Yes Does Caregiver/Family have Issues with Lodging/Transportation while Pt is in Rehab?: No  Goals Patient/Family Goal for Rehab: *** Expected length of stay: *** Pt/Family Agrees to Admission and willing to participate: Yes Program Orientation Provided & Reviewed with Pt/Caregiver Including Roles  & Responsibilities: Yes  Decrease burden of Care through IP rehab admission: NA  Possible need for SNF placement upon discharge: Not anticipated  Patient Condition: I have reviewed medical records from [facility], spoken with {CHL IP CSW KG:[id number]}, and patient and son. I met with patient  at the bedside and discussed via phone for inpatient rehabilitation assessment.  Patient will benefit from ongoing {CHL IP PT OT ZOX:[id number]}, can actively participate in 3 hours of therapy a day 5 days of the week, and can make measurable gains during the admission.  Patient will also benefit from the coordinated team approach during an Inpatient Acute Rehabilitation admission.  The patient will receive intensive therapy as well as Rehabilitation physician, nursing, social worker, and care management interventions.  Due to bladder management, safety, skin/wound care, disease management, medication administration, pain management, and patient education the patient requires 24 hour a day rehabilitation nursing.  The patient is currently *** with mobility and basic  ADLs.  Discharge setting and therapy post discharge at assisted living facility is anticipated.  Patient has agreed to participate in the Acute Inpatient Rehabilitation Program and will admit {Time; today/tomorrow:10263}.  Preadmission Screen Completed By:  Domingo Pulse, 09/02/2022 4:53 PM ______________________________________________________________________   Discussed status with Dr. Marland Kitchen on *** at *** and received approval for admission today.  Admission Coordinator:  Domingo Pulse, CCC-SLP, time ***/Date ***   Assessment/Plan: Diagnosis: Does the need for close, 24 hr/day Medical supervision in concert with the patient's rehab needs make it unreasonable for this patient to be served in a less intensive setting? {yes_no_potentially:3041433} Co-Morbidities requiring supervision/potential complications: *** Due to {due WJ:1914782}, does the patient require 24 hr/day rehab nursing? {yes_no_potentially:3041433} Does the patient require coordinated care of a physician, rehab nurse, PT, OT, and SLP to address physical and functional deficits in the context of the above medical diagnosis(es)? {yes_no_potentially:3041433} Addressing deficits in the following areas: {deficits:3041436} Can the patient actively participate in an intensive therapy program of at least 3 hrs of therapy 5 days a week? {yes_no_potentially:3041433} The potential for patient to make measurable gains while on inpatient rehab is {potential:3041437} Anticipated functional outcomes upon discharge from inpatient rehab: {functional outcomes:304600100} PT, {functional outcomes:304600100} OT, {functional outcomes:304600100} SLP Estimated rehab length of stay to reach the above functional goals is: *** Anticipated discharge destination: {anticipated dc setting:21604} 10. Overall Rehab/Functional Prognosis: {potential:3041437}   MD Signature: ***

## 2022-09-02 NOTE — Progress Notes (Signed)
Inpatient Rehab Admissions:  Inpatient Rehab Consult received.  I met with patient at the bedside for rehabilitation assessment and to discuss goals and expectations of an inpatient rehab admission.  Discussed average length of stay and discharge home after completion of CIR. Pt interested in pursuing CIR. Pt gave permission to contact son Divit. Spoke with Herbie Baltimore on the telephone. He also acknowledged understanding of CIR goals and expectations. He is supportive of pt pursuing CIR. He confirmed that pt will have support when he returns to [facility] after discharge. Will continue to follow.  Signed: Gayland Curry, [place], [place] Admissions Coordinator [phone]

## 2022-09-02 NOTE — Progress Notes (Signed)
[facility]                                                                              Justin Potter, is a 89 y.o. male, DOB - January 15, 1926, [id number] Admit date - 08/30/2022    Outpatient Primary MD for the patient is Justin Low, MD  LOS - 2  days  Chief Complaint  Patient presents with   Fall       Brief summary   Patient is a 89 y.o. male with PMH significant for HTN, HLD, slow A-fib on Eliquis, TIA, prostate cancer, macular degeneration and frequent falls who uses a walker at baseline and lives at [facility].  At baseline, is able to feed and get dressed by himself. On 2/7, patient was brought to the ED after a fall and multiple areas of pain, recurrent falls in the last 2 months. He apparently fell 2 days prior to admission while trying to get out of his bed and his feet became entangled and he fell to the ground. Since then he started having pain at multiple sites including right hip, left shoulder, back of the head and subsequently presented to the ED on 2/7.   In the ED, patient was afebrile, blood pressure initially was elevated as high as 200/91 Labs with WBC 10.8, hemoglobin 10.3, creatinine mildly elevated 1.4 CT head showed multiple small left posterior scalp hematoma.   CT thoracic spine showed acute nondisplaced T3 fracture and T6 fracture. Right hip imaging showed intramuscular hematoma of the gluteus maximus and soft tissue contusion. Patient was admitted for further workup   Assessment & Plan    Principal Problem:   Closed T6 spinal fracture ([place])   Closed T3 nondisplaced spinal fracture ([place]) -Plan for conservative management with TLSO brace, pain medication -PT OT evaluation recommended CIR -CIR consulted -Continue pain control, bowel regimen   Active Problems: Posterior Scalp hematoma Intramuscular hematoma of the gluteus maximus and soft tissue contusion -Eliquis currently on hold, continue supportive  care    Recurrent falls with fractures -At baseline uses a walker for ambulation. -PT evaluation recommended CIR, consult placed -Eliquis currently on hold    Persistent atrial fibrillation  -Not on any AV nodal blocking agents -Eliquis currently on hold.  Dr. Pietro Potter discussed with Dr Justin Potter (retired radiation oncologist) who stated that patient had a stroke 2 years ago, 3 days after stopping Eliquis and decision was made at that time to put him back on Eliquis. He is concerned about another stroke and would like to resume Eliquis if possible. - PT evaluation recommended CIR. -Patient did very well with PT on 2/9 and did not have significant pain, discussed with Dr. Valere Potter, will resume Eliquis as currently no plans for kyphoplasty.  If patient starts having significant pain or difficulty ambulating, will discuss with IR. -Follow CBC daily    Essential hypertension -Initial BP significantly elevated up to 200s, likely due to pain and not on any antihypertensives PTA. -Will place on hydralazine p.o. 10 mg 3 times daily, continue IV as needed with parameters  Mild normocytic anemia -Baseline hemoglobin around 12 -H&H has remained stable,  starting on eliquis, follow CBC closely  Mild acute kidney injury -Based 9 creatinine 1.0.  Presented with creatinine of 1.36, trended up to 1.4. -Improving, 0.96, at baseline     Code Status: DNR DVT Prophylaxis:  SCDs Start: 08/31/22 0121   Level of Care: Level of care: Telemetry Medical Family Communication: Updated patient's son, Dr Justin Potter on phone  Disposition Plan:      Remains inpatient appropriate: Pending CIR   Procedures:  None  Consultants:   CIR  Antimicrobials: None  Medications  senna-docusate  1 tablet Oral QHS    Subjective:   Justin Potter was seen and examined today.  Doing well, BP has been elevated.  No significant pain at rest.  Did well with PT yesterday.  Felt better after learning that he will be going to  rehab.  Objective:   Vitals:   09/01/22 1140 09/01/22 2002 09/02/22 0400 09/02/22 0838  BP: (!) 168/87 (!) 177/74 (!) 165/79 (!) 175/91  Pulse: 61 65 99 68  Resp: 18 16 17   $ Temp: 98 F (36.7 C) (!) 97.4 F (36.3 C) 98.9 F (37.2 C) 97.9 F (36.6 C)  TempSrc:  Oral  Oral  SpO2: 100% 100% 98% 91%  Weight:      Height:        Intake/Output Summary (Last 24 hours) at 09/02/2022 1223 Last data filed at 09/02/2022 0409 Gross per 24 hour  Intake --  Output 850 ml  Net -850 ml     Wt Readings from Last 3 Encounters:  08/30/22 77.1 kg  06/28/22 77.1 kg  05/21/22 63.5 kg     Exam General: Alert and oriented x 3, NAD Cardiovascular: S1 S2 auscultated,  RRR Respiratory: Clear to auscultation bilaterally Gastrointestinal: Soft, NT, ND, NBS Ext: no pedal edema bilaterally Neuro: Strength 5/5 upper and lower extremities bilaterally Psych: Normal affect     Data Reviewed:  I have personally reviewed following labs    CBC Lab Results  Component Value Date   WBC 12.2 (H) 08/31/2022   RBC 3.51 (L) 08/31/2022   HGB 10.4 (L) 08/31/2022   HCT 32.8 (L) 08/31/2022   MCV 93.4 08/31/2022   MCH 29.6 08/31/2022   PLT 205 08/31/2022   MCHC 31.7 08/31/2022   RDW 14.8 08/31/2022   LYMPHSABS 1.5 06/28/2022   MONOABS 0.8 06/28/2022   EOSABS 0.1 06/28/2022   BASOSABS 0.1 [id number]     Last metabolic panel Lab Results  Component Value Date   NA 136 08/31/2022   K 3.8 08/31/2022   CL 102 08/31/2022   CO2 25 08/31/2022   BUN 28 (H) 08/31/2022   CREATININE 0.96 08/31/2022   GLUCOSE 122 (H) 08/31/2022   GFRNONAA >60 08/31/2022   GFRAA 88 08/13/2018   CALCIUM 8.5 (L) 08/31/2022   PHOS 3.3 11/03/2020   PROT 6.2 (L) 08/30/2022   ALBUMIN 3.4 (L) 08/30/2022   BILITOT 0.2 (L) 08/30/2022   ALKPHOS 61 08/30/2022   AST 24 08/30/2022   ALT 15 08/30/2022   ANIONGAP 9 08/31/2022    CBG (last 3)  No results for input(s): "GLUCAP" in the last 72 hours.    Coagulation  Profile: Recent Labs  Lab 08/30/22 2023  INR 1.6*     Radiology Studies: I have personally reviewed the imaging studies  No results found.     Justin Potter M.D. [facility] 09/02/2022, 12:23 PM  Available via Epic secure chat 7am-7pm After 7 pm, please refer to night coverage provider listed on  amion.

## 2022-09-03 DIAGNOSIS — T148XXA Other injury of unspecified body region, initial encounter: Secondary | ICD-10-CM | POA: Diagnosis not present

## 2022-09-03 DIAGNOSIS — S22050A Wedge compression fracture of T5-T6 vertebra, initial encounter for closed fracture: Secondary | ICD-10-CM | POA: Diagnosis not present

## 2022-09-03 DIAGNOSIS — R296 Repeated falls: Secondary | ICD-10-CM | POA: Diagnosis not present

## 2022-09-03 DIAGNOSIS — N179 Acute kidney failure, unspecified: Secondary | ICD-10-CM | POA: Diagnosis not present

## 2022-09-03 LAB — CBC
HCT: 27.8 % — ABNORMAL LOW (ref 39.0–52.0)
Hemoglobin: 8.7 g/dL — ABNORMAL LOW (ref 13.0–17.0)
MCH: 29.2 pg (ref 26.0–34.0)
MCHC: 31.3 g/dL (ref 30.0–36.0)
MCV: 93.3 fL (ref 80.0–100.0)
Platelets: 242 10*3/uL (ref 150–400)
RBC: 2.98 MIL/uL — ABNORMAL LOW (ref 4.22–5.81)
RDW: 14.5 % (ref 11.5–15.5)
WBC: 8.9 10*3/uL (ref 4.0–10.5)
nRBC: 0 % (ref 0.0–0.2)

## 2022-09-03 LAB — BASIC METABOLIC PANEL
Anion gap: 9 (ref 5–15)
BUN: 17 mg/dL (ref 8–23)
CO2: 24 mmol/L (ref 22–32)
Calcium: 8.3 mg/dL — ABNORMAL LOW (ref 8.9–10.3)
Chloride: 105 mmol/L (ref 98–111)
Creatinine, Ser: 0.76 mg/dL (ref 0.61–1.24)
GFR, Estimated: >60 mL/min (ref >60)
Glucose, Bld: 102 mg/dL — ABNORMAL HIGH (ref 70–99)
Potassium: 3.7 mmol/L (ref 3.5–5.1)
Sodium: 138 mmol/L (ref 135–145)

## 2022-09-03 MED ORDER — HALOPERIDOL LACTATE 5 MG/ML IJ SOLN
0.5000 mg | Freq: Four times a day (QID) | INTRAMUSCULAR | Status: DC | PRN
  Administered 2022-09-04: 0.5 mg via INTRAVENOUS
  Filled 2022-09-03: qty 1

## 2022-09-03 MED ORDER — QUETIAPINE FUMARATE 25 MG PO TABS
25.0000 mg | ORAL_TABLET | Freq: Every day | ORAL | Status: DC
  Administered 2022-09-03 – 2022-09-05 (×3): 25 mg via ORAL
  Filled 2022-09-03 (×3): qty 1

## 2022-09-03 NOTE — Progress Notes (Addendum)
[facility]                                                                              Justin Potter, is a 89 y.o. male, DOB - [birth year], [id number] Admit date - 08/30/2022    Outpatient Primary MD for the patient is Justin Low, MD  LOS - 3  days  Chief Complaint  Patient presents with   Fall       Brief summary   Patient is a 89 y.o. male with PMH significant for HTN, HLD, slow A-fib on Eliquis, TIA, prostate cancer, macular degeneration and frequent falls who uses a walker at baseline and lives at [place].  At baseline, is able to feed and get dressed by himself. On 2/7, patient was brought to the ED after a fall and multiple areas of pain, recurrent falls in the last 2 months. He apparently fell 2 days prior to admission while trying to get out of his bed and his feet became entangled and he fell to the ground. Since then he started having pain at multiple sites including right hip, left shoulder, back of the head and subsequently presented to the ED on 2/7.   In the ED, patient was afebrile, blood pressure initially was elevated as high as 200/91 Labs with WBC 10.8, hemoglobin 10.3, creatinine mildly elevated 1.4 CT head showed multiple small left posterior scalp hematoma.   CT thoracic spine showed acute nondisplaced T3 fracture and T6 fracture. Right hip imaging showed intramuscular hematoma of the gluteus maximus and soft tissue contusion. Patient was admitted for further workup   Assessment & Plan    Principal Problem:   Closed T6 spinal fracture ([place])   Closed T3 nondisplaced spinal fracture ([place]) -Plan for conservative management with TLSO brace, pain medication -PT OT evaluation recommended CIR -CIR consulted -Continue pain control, bowel regimen   Active Problems: Posterior Scalp hematoma Intramuscular hematoma of the gluteus maximus and soft tissue contusion -Eliquis resumed, hemoglobin down to 8.7  today -Follow CBC -If hemoglobin trending down tomorrow, will obtain CT of the right hip to assess the hematoma if enlarging.    Recurrent falls with fractures -At baseline uses a walker for ambulation. -PT evaluation recommended CIR   Persistent atrial fibrillation  -Not on any AV nodal blocking agents -Eliquis currently on hold.  Dr. Pietro Potter discussed with Dr Justin Potter (retired radiation oncologist) who stated that patient had a stroke 2 years ago, 3 days after stopping Eliquis and decision was made at that time to put him back on Eliquis. He is concerned about another stroke and would like to resume Eliquis if possible. - PT evaluation recommended CIR. -Patient did very well with PT on 2/9 and did not have significant pain, discussed with Dr. Valere Potter, eliquis was resumed on 2/10, as no plans for kyphoplasty.   -Hemoglobin dropped down to 8.7 today, follow CBC in a.m.  If hb trending down, will obtain CT of the right hip to assess the hematoma.  Eliquis will need to be held   Essential hypertension -Initial BP significantly elevated up to 200s, likely due to pain and not on  any antihypertensives PTA. -BP stable, continue hydralazine 10 mg 3 times daily  -Continue hydralazine IV as needed with parameters   Mild normocytic anemia -Baseline hemoglobin around 12 -Hemoglobin down to 8.7 today, repeat CBC in a.m.  Mild acute kidney injury -Based 9 creatinine 1.0.  Presented with creatinine of 1.36, trended up to 1.4. -Improving, creatinine at baseline  Agitation, sundowning  - Added seroquel 32m qhs and Potter dose haldol as needed for severe agitation   Code Status: DNR DVT Prophylaxis:  SCDs Start: 08/31/22 0121 apixaban (ELIQUIS) tablet 5 mg   Level of Care: Level of care: Telemetry Medical Family Communication: Updated patient's son, Dr Justin Drosson phone on 2/10 Disposition Plan:      Remains inpatient appropriate: Pending CIR   Procedures:  None  Consultants:    CIR  Antimicrobials: None  Medications  apixaban  5 mg Oral BID   hydrALAZINE  10 mg Oral Q8H   senna-docusate  1 tablet Oral QHS    Subjective:   RLumen Girardwas seen and examined today.  Somewhat sleepy this morning during encounter, denies any specific complaints, pain is controlled.  Objective:   Vitals:   09/03/22 0546 09/03/22 0911 09/03/22 0911 09/03/22 0917  BP: (!) 180/86 (!) 146/71 (!) 146/71 138/68  Pulse:  65 61 78  Resp: 20     Temp: 97.9 F (36.6 C) 97.9 F (36.6 C) 97.9 F (36.6 C) 99.8 F (37.7 C)  TempSrc: Oral Oral Oral Oral  SpO2: 100% 100% 99% 99%  Weight:      Height:        Intake/Output Summary (Last 24 hours) at 09/03/2022 1229 Last data filed at 09/03/2022 0546 Gross per 24 hour  Intake 480 ml  Output 1100 ml  Net -620 ml     Wt Readings from Last 3 Encounters:  08/30/22 77.1 kg  06/28/22 77.1 kg  05/21/22 63.5 kg    Physical Exam General: Somnolent but easily arousable, NAD Cardiovascular: S1 S2 clear, RRR.  Respiratory: CTAB, no wheezing Gastrointestinal: Soft, nontender, nondistended, NBS Ext: no pedal edema bilaterally Neuro: no new deficits Psych: somnolent but arousable    Data Reviewed:  I have personally reviewed following labs    CBC Lab Results  Component Value Date   WBC 8.9 09/03/2022   RBC 2.98 (L) 09/03/2022   HGB 8.7 (L) 09/03/2022   HCT 27.8 (L) 09/03/2022   MCV 93.3 09/03/2022   MCH 29.2 09/03/2022   PLT 242 09/03/2022   MCHC 31.3 09/03/2022   RDW 14.5 09/03/2022   LYMPHSABS 1.5 06/28/2022   MONOABS 0.8 06/28/2022   EOSABS 0.1 06/28/2022   BASOSABS 0.1 [id number]    Last metabolic panel Lab Results  Component Value Date   NA 138 09/03/2022   K 3.7 09/03/2022   CL 105 09/03/2022   CO2 24 09/03/2022   BUN 17 09/03/2022   CREATININE 0.76 09/03/2022   GLUCOSE 102 (H) 09/03/2022   GFRNONAA >60 09/03/2022   GFRAA 88 08/13/2018   CALCIUM 8.3 (L) 09/03/2022   PHOS 3.3 11/03/2020   PROT  6.2 (L) 08/30/2022   ALBUMIN 3.4 (L) 08/30/2022   BILITOT 0.2 (L) 08/30/2022   ALKPHOS 61 08/30/2022   AST 24 08/30/2022   ALT 15 08/30/2022   ANIONGAP 9 09/03/2022    CBG (last 3)  No results for input(s): "GLUCAP" in the last 72 hours.    Coagulation Profile: Recent Labs  Lab 08/30/22 2023  INR 1.6*  Radiology Studies: I have personally reviewed the imaging studies  No results found.     Justin Potter M.D. [facility] 09/03/2022, 12:29 PM  Available via Epic secure chat 7am-7pm After 7 pm, please refer to night coverage provider listed on amion.

## 2022-09-03 NOTE — Progress Notes (Signed)
Afib at baseline, HR intermittently dropping 30's-40's; sustained at 45 at this time while sleeping; provider made aware.

## 2022-09-03 NOTE — Progress Notes (Addendum)
Patient refused a.m labs phlebotomy will attempt to get morning labs around 0700.

## 2022-09-04 DIAGNOSIS — S22050A Wedge compression fracture of T5-T6 vertebra, initial encounter for closed fracture: Secondary | ICD-10-CM | POA: Diagnosis not present

## 2022-09-04 DIAGNOSIS — N179 Acute kidney failure, unspecified: Secondary | ICD-10-CM | POA: Diagnosis not present

## 2022-09-04 DIAGNOSIS — R296 Repeated falls: Secondary | ICD-10-CM | POA: Diagnosis not present

## 2022-09-04 DIAGNOSIS — T148XXA Other injury of unspecified body region, initial encounter: Secondary | ICD-10-CM | POA: Diagnosis not present

## 2022-09-04 LAB — CBC
HCT: 27.9 % — ABNORMAL LOW (ref 39.0–52.0)
Hemoglobin: 9.2 g/dL — ABNORMAL LOW (ref 13.0–17.0)
MCH: 30.6 pg (ref 26.0–34.0)
MCHC: 33 g/dL (ref 30.0–36.0)
MCV: 92.7 fL (ref 80.0–100.0)
Platelets: 250 10*3/uL (ref 150–400)
RBC: 3.01 MIL/uL — ABNORMAL LOW (ref 4.22–5.81)
RDW: 14.6 % (ref 11.5–15.5)
WBC: 9.3 10*3/uL (ref 4.0–10.5)
nRBC: 0 % (ref 0.0–0.2)

## 2022-09-04 LAB — BASIC METABOLIC PANEL
Anion gap: 9 (ref 5–15)
BUN: 18 mg/dL (ref 8–23)
CO2: 24 mmol/L (ref 22–32)
Calcium: 8.3 mg/dL — ABNORMAL LOW (ref 8.9–10.3)
Chloride: 105 mmol/L (ref 98–111)
Creatinine, Ser: 0.77 mg/dL (ref 0.61–1.24)
GFR, Estimated: >60 mL/min (ref >60)
Glucose, Bld: 107 mg/dL — ABNORMAL HIGH (ref 70–99)
Potassium: 3.9 mmol/L (ref 3.5–5.1)
Sodium: 138 mmol/L (ref 135–145)

## 2022-09-04 NOTE — Progress Notes (Signed)
Physical Therapy Treatment Patient Details Name: Justin Potter MRN: [id number] DOB: 1925-12-22 Today's Date: 09/04/2022   History of Present Illness 89 y/o male who was brought to the ED on 08/30/22 after a fall at home. Has been having recurrent falls for past 2 months. CT thoracic spine shows acute T3 and T6 fractures. R hip imaging with intramuscular hematoma in glute max. PMH Afib, gout, prostate CA, TIA, HLD, HTN, macular degeneration, TKA.    PT Comments    Pt received in supine, lethargic but awoken with verbal/tactile cues and pt following simple commands for safety with back precautions and functional mobility tasks. Pt needing up to +2 modA for sit<>stand from elevated bed height and pivotal steps to recliner chair at bedside. Reviewed supine/seated BLE exercises and pt performed with multimodal cues. Back precaution handout printed for pt's son to review when he visits his room next date, per pt his son visits daily. Pt up in chair with TLSO donned at end of session, chair alarm on for safety, RN notified of safe technique for return to bed (+2 with [facility]) and written on his board. Pt continues to benefit from PT services to progress toward functional mobility goals.    Recommendations for follow up therapy are one component of a multi-disciplinary discharge planning process, led by the attending physician.  Recommendations may be updated based on patient status, additional functional criteria and insurance authorization.  Follow Up Recommendations  Acute inpatient rehab (3hours/day)     Assistance Recommended at Discharge Frequent or constant Supervision/Assistance  Patient can return home with the following A lot of help with bathing/dressing/bathroom;Direct supervision/assist for medications management;A lot of help with walking and/or transfers;Two people to help with bathing/dressing/bathroom;Two people to help with walking and/or transfers;Help with stairs or ramp for entrance    Equipment Recommendations  Other (comment) (TBD post-acute)    Recommendations for Other Services Rehab consult     Precautions / Restrictions Precautions Precautions: Fall;Other (comment);Back Precaution Booklet Issued: Yes (comment) Precaution Comments: poor vision (can see shadows; macular degeneration), watch BP (hx of orthostasis), hx of Afib with regular rate 40-60, R knee can buckle Required Braces or Orthoses: Spinal Brace Spinal Brace: Thoracolumbosacral orthotic;Applied in sitting position (Per MD Rai, When OOB (can don sitting)) Restrictions Weight Bearing Restrictions: No     Mobility  Bed Mobility Overal bed mobility: Needs Assistance Bed Mobility: Rolling, Sidelying to Sit Rolling: Mod assist, +2 for physical assistance Sidelying to sit: Max assist, +2 for physical assistance       General bed mobility comments: heavy cues and step by step instructions for sequencing, totalA for TLSO donning    Transfers Overall transfer level: Needs assistance Equipment used: Rolling walker (2 wheels) Transfers: Sit to/from Stand, Bed to chair/wheelchair/BSC Sit to Stand: Mod assist, +2 physical assistance   Step pivot transfers: Mod assist, +2 physical assistance, +2 safety/equipment, From elevated surface       General transfer comment: ModAx2 for all transfer activity- tends to push RW too far ahead of him, needed PTA to pull it closer toward him.    Ambulation/Gait               General Gait Details: pt fatigued after pivot to chair and falling asleep, defer longer gait trial due to drowsiness   Stairs             Wheelchair Mobility    Modified Rankin (Stroke Patients Only)       Balance Overall balance assessment: Needs  assistance, History of Falls Sitting-balance support: Feet supported, Bilateral upper extremity supported Sitting balance-Leahy Scale: Fair     Standing balance support: Bilateral upper extremity supported, During  functional activity, Reliant on assistive device for balance Standing balance-Leahy Scale: Poor Standing balance comment: reliant on RW and +2 assist for dynamic standing tasks for safety                            Cognition Arousal/Alertness: Lethargic Behavior During Therapy: WFL for tasks assessed/performed Overall Cognitive Status: Within Functional Limits for tasks assessed                                 General Comments: Poor safety awareness and recall of back precs, handout printed so pt can show his son when he visits tomorrow, hopefully family can help remind him; unsure of his cognitive baseline cognition but he does have hx dementia per chart review. Pt A&O to self, situation but internally distracted at times and did not answer all orientation questions. Pt frequently closing eyes during session.        Exercises General Exercises - Lower Extremity Ankle Circles/Pumps: AROM, Both, 10 reps, Supine Long Arc Quad: AROM, AAROM, Both, 10 reps, Seated Heel Slides: AAROM, Both, Supine, 5 reps Hip ABduction/ADduction: AAROM, 5 reps, Both, Supine Hip Flexion/Marching: AROM, Both, 5 reps, Standing    General Comments General comments (skin integrity, edema, etc.): No acute s/sx distress, HR 60's bpm with exertional tasks and resting      Pertinent Vitals/Pain Pain Assessment Pain Assessment: Faces Faces Pain Scale: Hurts little more Pain Location: B anterior thighs, B feet "tender" Pain Descriptors / Indicators: Sore, Grimacing, Guarding, Tender Pain Intervention(s): Monitored during session, Limited activity within patient's tolerance, Repositioned (geo mat pressure relief cushion in his chair for back/bottom comfort)           PT Goals (current goals can now be found in the care plan section) Acute Rehab PT Goals Patient Stated Goal: rehab PT Goal Formulation: With patient/family Time For Goal Achievement: 09/15/22 Progress towards PT goals:  Progressing toward goals    Frequency    Min 3X/week      PT Plan Current plan remains appropriate       AM-PAC PT "6 Clicks" Mobility   Outcome Measure  Help needed turning from your back to your side while in a flat bed without using bedrails?: A Lot Help needed moving from lying on your back to sitting on the side of a flat bed without using bedrails?: A Lot Help needed moving to and from a bed to a chair (including a wheelchair)?: A Lot Help needed standing up from a chair using your arms (e.g., wheelchair or bedside chair)?: Total (lower height surfaces) Help needed to walk in hospital room?: Total Help needed climbing 3-5 steps with a railing? : Total 6 Click Score: 9    End of Session Equipment Utilized During Treatment: Gait belt;Back brace Activity Tolerance: Patient tolerated treatment well;Patient limited by lethargy Patient left: in chair;with call bell/phone within reach;with chair alarm set Nurse Communication: Mobility status;Need for lift equipment;Other (comment);Precautions Charlaine Dalton may be safer due to pt lethargy/fatigue returning from chair, +2 safety recommended) PT Visit Diagnosis: Unsteadiness on feet (R26.81);Difficulty in walking, not elsewhere classified (R26.2);Repeated falls (R29.6);Muscle weakness (generalized) (M62.81)     Time: [id number] PT Time Calculation (min) (ACUTE ONLY): 33 min  Charges:  $Therapeutic Exercise: 8-22 mins $Therapeutic Activity: 8-22 mins                     Jissel Slavens P., PTA Acute Rehabilitation Services Secure Chat Preferred 9a-5:30pm Office: [place] 09/04/2022, 3:52 PM

## 2022-09-04 NOTE — Progress Notes (Signed)
[facility]                                                                              Justin Potter, is a 89 y.o. male, DOB - [birth year], [id number] Admit date - 08/30/2022    Outpatient Primary MD for the patient is Justin Low, MD  LOS - 4  days  Chief Complaint  Patient presents with   Fall       Brief summary   Patient is a 89 y.o. male with PMH significant for HTN, HLD, slow A-fib on Eliquis, TIA, prostate cancer, macular degeneration and frequent falls who uses a walker at baseline and lives at [place].  At baseline, is able to feed and get dressed by himself. On 2/7, patient was brought to the ED after a fall and multiple areas of pain, recurrent falls in the last 2 months. He apparently fell 2 days prior to admission while trying to get out of his bed and his feet became entangled and he fell to the ground. Since then he started having pain at multiple sites including right hip, left shoulder, back of the head and subsequently presented to the ED on 2/7.   In the ED, patient was afebrile, blood pressure initially was elevated as high as 200/91 Labs with WBC 10.8, hemoglobin 10.3, creatinine mildly elevated 1.4 CT head showed multiple small left posterior scalp hematoma.   CT thoracic spine showed acute nondisplaced T3 fracture and T6 fracture. Right hip imaging showed intramuscular hematoma of the gluteus maximus and soft tissue contusion. Patient was admitted for further workup   Assessment & Plan    Principal Problem:   Closed T6 spinal fracture ([place])   Closed T3 nondisplaced spinal fracture ([place]) -Plan for conservative management with TLSO brace, pain medication -PT OT evaluation recommended CIR, CIR consulted -Continue pain control, bowel regimen -Delirium precautions, patient now having more sundowning issues   Active Problems: Posterior Scalp hematoma Intramuscular hematoma of the gluteus maximus and soft  tissue contusion -Eliquis resumed -H&H stable, 9.2, follow closely    Recurrent falls with fractures -At baseline uses a walker for ambulation. -PT evaluation recommended CIR   Persistent atrial fibrillation  -Not on any AV nodal blocking agents, has occasionally slow ventricular response, asymptomatic -Initially eliquis was placed on hold, Dr. Pietro Potter discussed with Dr Justin Potter (retired radiation oncologist) who stated that patient had a stroke 2 years ago, 3 days after stopping Eliquis and decision was made at that time to put him back on Eliquis. - PT evaluation recommended CIR. -Patient did very well with PT on 2/9 and did not have significant pain, discussed with Dr. Valere Potter, eliquis was resumed on 2/10, as no plans for kyphoplasty.   -H&H stable, 9.2, follow closely. If hb trending down, will obtain CT of the right hip to assess the hematoma.  Eliquis will need to be held, explained to patient's son at the bedside.   Essential hypertension -Initial BP significantly elevated up to 200s, likely due to pain and not on any antihypertensives PTA. -BP stable, hydralazine 10 mg TID -Continue hydralazine IV as needed with parameters  Mild normocytic anemia -Baseline hemoglobin around 12 -H&H stable, 9.2.  Mild acute kidney injury -Based 9 creatinine 1.0.  Presented with creatinine of 1.36, trended up to 1.4. -Creatinine stable, 0.7  Agitation, sundowning  - Added seroquel 58m qhs and Potter dose haldol as needed for severe agitation   Code Status: DNR DVT Prophylaxis:  SCDs Start: 08/31/22 0121 apixaban (ELIQUIS) tablet 5 mg   Level of Care: Level of care: Telemetry Medical Family Communication: Updated patient's son, Dr Justin Drossat bedside today Disposition Plan:      Remains inpatient appropriate: Pending CIR when bed available   Procedures:  None  Consultants:   CIR  Antimicrobials: None  Medications  apixaban  5 mg Oral BID   hydrALAZINE  10 mg Oral Q8H   QUEtiapine  25  mg Oral QHS   senna-docusate  1 tablet Oral QHS    Subjective:   RLondell Madrilwas seen and examined today.  BP occasionally elevated, possibly due to agitation and pain.  Overnight had sundowning issues with lack of sleep and agitation.  Somewhat sleepy in the morning at the time of my encounter, patient's son at the bedside, hoping to get him to inpatient rehab soon.  Complaining of pain in both hips.  Objective:   Vitals:   09/03/22 2018 09/03/22 2059 09/04/22 0548 09/04/22 0858  BP: (!) 145/63  (!) 175/84 133/64  Pulse: (!) 56   70  Resp: (!) 23 15 19 18  $ Temp: 98.1 F (36.7 C)  98.1 F (36.7 C) (!) 97.5 F (36.4 C)  TempSrc: Oral  Oral Oral  SpO2: 99%   100%  Weight:      Height:        Intake/Output Summary (Last 24 hours) at 09/04/2022 1327 Last data filed at 09/04/2022 0553 Gross per 24 hour  Intake --  Output 775 ml  Net -775 ml     Wt Readings from Last 3 Encounters:  08/30/22 77.1 kg  06/28/22 77.1 kg  05/21/22 63.5 kg   Physical Exam General: Somnolent but easily arousable and oriented, NAD, son at the bedside Cardiovascular: S1 S2 clear, RRR.  Respiratory: CTAB, no wheezing, rales or rhonchi Gastrointestinal: Soft, nontender, nondistended, NBS Ext: no pedal edema bilaterally Neuro: no new deficits Psych: somnolent     Data Reviewed:  I have personally reviewed following labs    CBC Lab Results  Component Value Date   WBC 9.3 09/04/2022   RBC 3.01 (L) 09/04/2022   HGB 9.2 (L) 09/04/2022   HCT 27.9 (L) 09/04/2022   MCV 92.7 09/04/2022   MCH 30.6 09/04/2022   PLT 250 09/04/2022   MCHC 33.0 09/04/2022   RDW 14.6 09/04/2022   LYMPHSABS 1.5 06/28/2022   MONOABS 0.8 06/28/2022   EOSABS 0.1 06/28/2022   BASOSABS 0.1 [id number]    Last metabolic panel Lab Results  Component Value Date   NA 138 09/04/2022   K 3.9 09/04/2022   CL 105 09/04/2022   CO2 24 09/04/2022   BUN 18 09/04/2022   CREATININE 0.77 09/04/2022   GLUCOSE 107 (H)  09/04/2022   GFRNONAA >60 09/04/2022   GFRAA 88 08/13/2018   CALCIUM 8.3 (L) 09/04/2022   PHOS 3.3 11/03/2020   PROT 6.2 (L) 08/30/2022   ALBUMIN 3.4 (L) 08/30/2022   BILITOT 0.2 (L) 08/30/2022   ALKPHOS 61 08/30/2022   AST 24 08/30/2022   ALT 15 08/30/2022   ANIONGAP 9 09/04/2022    CBG (last 3)  No results  for input(s): "GLUCAP" in the last 72 hours.    Coagulation Profile: Recent Labs  Lab 08/30/22 2023  INR 1.6*     Radiology Studies: I have personally reviewed the imaging studies  No results found.     Estill Cotta M.D. [facility] 09/04/2022, 1:27 PM  Available via Epic secure chat 7am-7pm After 7 pm, please refer to night coverage provider listed on amion.

## 2022-09-04 NOTE — Progress Notes (Signed)
Inpatient Rehab Admissions Coordinator:    Note bradycardia yesterday PM. CIR following for admit once medically stable. Pt. States that [facility] will hold a bed for him.   Clemens Catholic, [place], [place] Admissions Coordinator  [phone] (celll) [phone] (office) ]

## 2022-09-04 NOTE — Progress Notes (Signed)
Occupational Therapy Treatment Patient Details Name: Justin Potter MRN: [id number] DOB: 1925-11-27 Today's Date: 09/04/2022   History of present illness 89 y/o male who was brought to the ED on 08/30/22 after a fall at home. Has been having recurrent falls for past 2 months. CT thoracic spine shows acute T3 and T6 fractures. R hip imaging with intramuscular hematoma in glute max. PMH Afib, gout, prostate CA, TIA, HLD, HTN, macular degeneration, TKA.   OT comments  Pt up in chair, but complaining of his buttocks being sore on geomat. Pt partially stood x 2 with RW, he is accustomed to using a lift chair. Completed seated grooming with min assist due to impaired vision. Reviewed back precautions.    Recommendations for follow up therapy are one component of a multi-disciplinary discharge planning process, led by the attending physician.  Recommendations may be updated based on patient status, additional functional criteria and insurance authorization.    Follow Up Recommendations  Acute inpatient rehab (3hours/day)     Assistance Recommended at Discharge Frequent or constant Supervision/Assistance  Patient can return home with the following  Two people to help with walking and/or transfers;Two people to help with bathing/dressing/bathroom;Assistance with cooking/housework;Assist for transportation;Help with stairs or ramp for entrance   Equipment Recommendations  Other (comment) (defer to next venue)    Recommendations for Other Services      Precautions / Restrictions Precautions Precautions: Fall;Back Precaution Booklet Issued: Yes (comment) Precaution Comments: reviewed back precautions with pt Required Braces or Orthoses: Spinal Brace Spinal Brace: Thoracolumbosacral orthotic;Applied in sitting position Restrictions Weight Bearing Restrictions: No       Mobility Bed Mobility                    Transfers Overall transfer level: Needs assistance Equipment used:  Rolling walker (2 wheels) Transfers: Sit to/from Stand             General transfer comment: partial stand x 2 for pressure relief from recliner, pt is used to a lift chair, limited knee flexion     Balance Overall balance assessment: Needs assistance, History of Falls   Sitting balance-Leahy Scale: Fair       Standing balance-Leahy Scale: Zero                             ADL either performed or assessed with clinical judgement   ADL Overall ADL's : Needs assistance/impaired     Grooming: Brushing hair;Oral care;Sitting;Minimal assistance                                      Extremity/Trunk Assessment              Vision   Additional Comments: signficant vision loss due to macular degeneration   Perception     Praxis      Cognition Arousal/Alertness: Awake/alert Behavior During Therapy: WFL for tasks assessed/performed Overall Cognitive Status: No family/caregiver present to determine baseline cognitive functioning                                 General Comments: hx of dementia, decreased awareness of deficits        Exercises      Shoulder Instructions       General Comments No acute s/sx distress,  HR 60's bpm with exertional tasks and resting    Pertinent Vitals/ Pain       Pain Assessment Pain Assessment: Faces Faces Pain Scale: Hurts little more Pain Location: feet/toes Pain Descriptors / Indicators: Tender Pain Intervention(s): Monitored during session, Repositioned  Home Living                                          Prior Functioning/Environment              Frequency  Min 2X/week        Progress Toward Goals  OT Goals(current goals can now be found in the care plan section)  Progress towards OT goals: Progressing toward goals  Acute Rehab OT Goals OT Goal Formulation: With patient/family Time For Goal Achievement: 09/15/22 Potential to Achieve Goals: Good   Plan Discharge plan remains appropriate    Co-evaluation                 AM-PAC OT "6 Clicks" Daily Activity     Outcome Measure   Help from another person eating meals?: A Little Help from another person taking care of personal grooming?: A Little Help from another person toileting, which includes using toliet, bedpan, or urinal?: A Lot Help from another person bathing (including washing, rinsing, drying)?: A Lot Help from another person to put on and taking off regular upper body clothing?: A Lot Help from another person to put on and taking off regular lower body clothing?: Total 6 Click Score: 13    End of Session Equipment Utilized During Treatment: Gait belt;Rolling walker (2 wheels);Back brace  OT Visit Diagnosis: Unsteadiness on feet (R26.81);Repeated falls (R29.6);Muscle weakness (generalized) (M62.81);History of falling (Z91.81);Low vision, both eyes (H54.2);Other symptoms and signs involving cognitive function;Pain   Activity Tolerance Patient tolerated treatment well   Patient Left in chair;with call bell/phone within reach;with chair alarm set   Nurse Communication          Time: [phone] OT Time Calculation (min): 29 min  Charges: OT General Charges $OT Visit: 1 Visit OT Treatments $Self Care/Home Management : 8-22 mins $Therapeutic Activity: 8-22 mins  Cleta Alberts, OTR/L Acute Rehabilitation Services Office: [phone]   Justin Potter 09/04/2022, 4:09 PM

## 2022-09-04 NOTE — Care Management Important Message (Signed)
Important Message  Patient Details  Name: Justin Potter MRN: [id number] Date of Birth: 1925/09/02   Medicare Important Message Given:  Yes     Olivia Pavelko Montine Circle 09/04/2022, 3:03 PM

## 2022-09-05 DIAGNOSIS — T148XXA Other injury of unspecified body region, initial encounter: Secondary | ICD-10-CM | POA: Diagnosis not present

## 2022-09-05 DIAGNOSIS — R296 Repeated falls: Secondary | ICD-10-CM | POA: Diagnosis not present

## 2022-09-05 DIAGNOSIS — S22050A Wedge compression fracture of T5-T6 vertebra, initial encounter for closed fracture: Secondary | ICD-10-CM | POA: Diagnosis not present

## 2022-09-05 DIAGNOSIS — N179 Acute kidney failure, unspecified: Secondary | ICD-10-CM | POA: Diagnosis not present

## 2022-09-05 LAB — CBC
HCT: 28.2 % — ABNORMAL LOW (ref 39.0–52.0)
Hemoglobin: 9 g/dL — ABNORMAL LOW (ref 13.0–17.0)
MCH: 29.7 pg (ref 26.0–34.0)
MCHC: 31.9 g/dL (ref 30.0–36.0)
MCV: 93.1 fL (ref 80.0–100.0)
Platelets: 306 10*3/uL (ref 150–400)
RBC: 3.03 MIL/uL — ABNORMAL LOW (ref 4.22–5.81)
RDW: 14.5 % (ref 11.5–15.5)
WBC: 7.8 10*3/uL (ref 4.0–10.5)
nRBC: 0 % (ref 0.0–0.2)

## 2022-09-05 MED ORDER — TRAMADOL HCL 50 MG PO TABS
50.0000 mg | ORAL_TABLET | Freq: Three times a day (TID) | ORAL | Status: DC | PRN
  Administered 2022-09-05 – 2022-09-06 (×2): 50 mg via ORAL
  Filled 2022-09-05 (×2): qty 1

## 2022-09-05 NOTE — Progress Notes (Signed)
Inpatient Rehab Admissions Coordinator:   I do not have a bed for this Pt. On CIR today. I will continue to follow for potential admit pending bed availability.  Clemens Catholic, [place], [place] Admissions Coordinator  [phone] ([place]) [phone] (office)

## 2022-09-05 NOTE — Progress Notes (Signed)
[facility]                                                                              Justin Potter, is a 89 y.o. male, DOB - 22-Sep-1925, [id number] Admit date - 08/30/2022    Outpatient Primary MD for the patient is [name], MD  LOS - 5  days  Chief Complaint  Patient presents with   Fall       Brief summary   Patient is a 89 y.o. male with PMH significant for HTN, HLD, slow A-fib on Eliquis, TIA, prostate cancer, macular degeneration and frequent falls who uses a walker at baseline and lives at [facility].  At baseline, is able to feed and get dressed by himself. On 2/7, patient was brought to the ED after a fall and multiple areas of pain, recurrent falls in the last 2 months. He apparently fell 2 days prior to admission while trying to get out of his bed and his feet became entangled and he fell to the ground. Since then he started having pain at multiple sites including right hip, left shoulder, back of the head and subsequently presented to the ED on 2/7.   In the ED, patient was afebrile, blood pressure initially was elevated as high as 200/91 Labs with WBC 10.8, hemoglobin 10.3, creatinine mildly elevated 1.4 CT head showed multiple small left posterior scalp hematoma.   CT thoracic spine showed acute nondisplaced T3 fracture and T6 fracture. Right hip imaging showed intramuscular hematoma of the gluteus maximus and soft tissue contusion. Patient was admitted for further workup  2/13: Awaiting CIR bed, medically stable  Assessment & Plan    Principal Problem:   Closed T6 spinal fracture (HCC)   Closed T3 nondisplaced spinal fracture ([place]) -Plan for conservative management with TLSO brace, pain medication -PT OT evaluation recommended CIR, CIR consulted, awaiting bed -Continue pain control, bowel regimen -Delirium precautions, patient now having more sundowning issues   Active Problems: Posterior Scalp  hematoma Intramuscular hematoma of the gluteus maximus and soft tissue contusion -Eliquis resumed -H&H stable, 9.0   Recurrent falls with fractures -At baseline uses a walker for ambulation. -PT evaluation recommended CIR, currently awaiting a bed availability   Persistent atrial fibrillation  -Not on any AV nodal blocking agents, has occasionally slow ventricular response, asymptomatic -Initially eliquis was placed on hold, Dr. Pietro Cassis discussed with Dr Valere Dross (retired radiation oncologist) who stated that patient had a stroke 2 years ago, 3 days after stopping Eliquis and decision was made at that time to put him back on Eliquis. - PT evaluation recommended CIR. -Patient did very well with PT on 2/9 and did not have significant pain, discussed with Dr. Valere Dross, eliquis was resumed on 2/10, as no plans for kyphoplasty.   -H&H stable. (If hb trending down, will need to obtain CT of the right hip to assess the hematoma and hold Eliquis, explained to patient's son at the bedside).   Essential hypertension -Initial BP significantly elevated up to 200s, likely due to pain and not on any antihypertensives PTA. -BP now stable, continue hydralazine 10 mg tid -Continue hydralazine  IV as needed with parameters   Mild normocytic anemia -Baseline hemoglobin around 12 -H&H stable  Mild acute kidney injury -Based 9 creatinine 1.0.  Presented with creatinine of 1.36, trended up to 1.4. -Creatinine stable, 0.7  Agitation, sundowning  - Added seroquel 24m qhs and low dose haldol as needed for severe agitation   Code Status: DNR DVT Prophylaxis:  SCDs Start: 08/31/22 0121 apixaban (ELIQUIS) tablet 5 mg   Level of Care: Level of care: Telemetry Medical Family Communication: Updated patient's son, Dr MValere Drossat bedside on 09/04/2022 Disposition Plan:      Remains inpatient appropriate: Pending CIR when bed available   Procedures:  None  Consultants:   CIR  Antimicrobials:  None  Medications  apixaban  5 mg Oral BID   hydrALAZINE  10 mg Oral Q8H   QUEtiapine  25 mg Oral QHS   senna-docusate  1 tablet Oral QHS    Subjective:   Justin Fischbeckwas seen and examined today.  No acute complaints, hoping to go to rehab soon.  Has been having off-and-on agitation and sundowning issues.  Objective:   Vitals:   09/04/22 2045 09/04/22 2211 09/05/22 0600 09/05/22 0830  BP: (!) 159/68 (!) 143/54 (!) 129/54 (!) 124/93  Pulse: 72   88  Resp:   (!) 21 20  Temp:   98 F (36.7 C) 98.1 F (36.7 C)  TempSrc:   Oral Oral  SpO2:      Weight:      Height:        Intake/Output Summary (Last 24 hours) at 09/05/2022 1145 Last data filed at 09/05/2022 0955 Gross per 24 hour  Intake 320 ml  Output 550 ml  Net -230 ml     Wt Readings from Last 3 Encounters:  08/30/22 77.1 kg  06/28/22 77.1 kg  05/21/22 63.5 kg    Physical Exam General: Alert and oriented x 3, NAD Cardiovascular: S1 S2 clear, RRR.  Respiratory: CTAB, no wheezing Gastrointestinal: Soft, nontender, nondistended, NBS Ext: no pedal edema bilaterally Neuro: no new deficits Skin: No rashes Psych: Normal affect, pleasant    Data Reviewed:  I have personally reviewed following labs    CBC Lab Results  Component Value Date   WBC 7.8 09/05/2022   RBC 3.03 (L) 09/05/2022   HGB 9.0 (L) 09/05/2022   HCT 28.2 (L) 09/05/2022   MCV 93.1 09/05/2022   MCH 29.7 09/05/2022   PLT 306 09/05/2022   MCHC 31.9 09/05/2022   RDW 14.5 09/05/2022   LYMPHSABS 1.5 06/28/2022   MONOABS 0.8 06/28/2022   EOSABS 0.1 06/28/2022   BASOSABS 0.1 [id number]    Last metabolic panel Lab Results  Component Value Date   NA 138 09/04/2022   K 3.9 09/04/2022   CL 105 09/04/2022   CO2 24 09/04/2022   BUN 18 09/04/2022   CREATININE 0.77 09/04/2022   GLUCOSE 107 (H) 09/04/2022   GFRNONAA >60 09/04/2022   GFRAA 88 08/13/2018   CALCIUM 8.3 (L) 09/04/2022   PHOS 3.3 11/03/2020   PROT 6.2 (L) 08/30/2022    ALBUMIN 3.4 (L) 08/30/2022   BILITOT 0.2 (L) 08/30/2022   ALKPHOS 61 08/30/2022   AST 24 08/30/2022   ALT 15 08/30/2022   ANIONGAP 9 09/04/2022    CBG (last 3)  No results for input(s): "GLUCAP" in the last 72 hours.    Coagulation Profile: Recent Labs  Lab 08/30/22 2023  INR 1.6*     Radiology Studies: I have personally reviewed  the imaging studies  No results found.     Estill Cotta M.D. [facility] 09/05/2022, 11:45 AM  Available via Epic secure chat 7am-7pm After 7 pm, please refer to night coverage provider listed on amion.

## 2022-09-06 ENCOUNTER — Other Ambulatory Visit: Payer: Self-pay

## 2022-09-06 ENCOUNTER — Encounter (HOSPITAL_COMMUNITY): Payer: Self-pay | Admitting: Physical Medicine and Rehabilitation

## 2022-09-06 ENCOUNTER — Inpatient Hospital Stay (HOSPITAL_COMMUNITY)
Admission: RE | Admit: 2022-09-06 | Discharge: 2022-09-18 | DRG: 945 | Disposition: A | Discharge status: Skilled Nursing Facility | Payer: Medicare Other | Source: Intra-hospital | Attending: Physical Medicine and Rehabilitation | Admitting: Physical Medicine and Rehabilitation

## 2022-09-06 DIAGNOSIS — M546 Pain in thoracic spine: Secondary | ICD-10-CM | POA: Diagnosis not present

## 2022-09-06 DIAGNOSIS — Z87440 Personal history of urinary (tract) infections: Secondary | ICD-10-CM | POA: Diagnosis not present

## 2022-09-06 DIAGNOSIS — F29 Unspecified psychosis not due to a substance or known physiological condition: Secondary | ICD-10-CM | POA: Diagnosis not present

## 2022-09-06 DIAGNOSIS — Z8673 Personal history of transient ischemic attack (TIA), and cerebral infarction without residual deficits: Secondary | ICD-10-CM

## 2022-09-06 DIAGNOSIS — Z743 Need for continuous supervision: Secondary | ICD-10-CM | POA: Diagnosis not present

## 2022-09-06 DIAGNOSIS — B964 Proteus (mirabilis) (morganii) as the cause of diseases classified elsewhere: Secondary | ICD-10-CM | POA: Diagnosis present

## 2022-09-06 DIAGNOSIS — Z85828 Personal history of other malignant neoplasm of skin: Secondary | ICD-10-CM | POA: Diagnosis not present

## 2022-09-06 DIAGNOSIS — E785 Hyperlipidemia, unspecified: Secondary | ICD-10-CM | POA: Diagnosis present

## 2022-09-06 DIAGNOSIS — R944 Abnormal results of kidney function studies: Secondary | ICD-10-CM | POA: Diagnosis present

## 2022-09-06 DIAGNOSIS — Z7901 Long term (current) use of anticoagulants: Secondary | ICD-10-CM

## 2022-09-06 DIAGNOSIS — S22009S Unspecified fracture of unspecified thoracic vertebra, sequela: Secondary | ICD-10-CM | POA: Diagnosis not present

## 2022-09-06 DIAGNOSIS — Z8546 Personal history of malignant neoplasm of prostate: Secondary | ICD-10-CM | POA: Diagnosis not present

## 2022-09-06 DIAGNOSIS — R471 Dysarthria and anarthria: Secondary | ICD-10-CM | POA: Diagnosis present

## 2022-09-06 DIAGNOSIS — I959 Hypotension, unspecified: Secondary | ICD-10-CM | POA: Diagnosis not present

## 2022-09-06 DIAGNOSIS — Z961 Presence of intraocular lens: Secondary | ICD-10-CM | POA: Diagnosis present

## 2022-09-06 DIAGNOSIS — R296 Repeated falls: Secondary | ICD-10-CM | POA: Diagnosis present

## 2022-09-06 DIAGNOSIS — K5900 Constipation, unspecified: Secondary | ICD-10-CM | POA: Diagnosis not present

## 2022-09-06 DIAGNOSIS — H409 Unspecified glaucoma: Secondary | ICD-10-CM | POA: Diagnosis present

## 2022-09-06 DIAGNOSIS — D62 Acute posthemorrhagic anemia: Secondary | ICD-10-CM | POA: Diagnosis present

## 2022-09-06 DIAGNOSIS — I4891 Unspecified atrial fibrillation: Secondary | ICD-10-CM | POA: Diagnosis present

## 2022-09-06 DIAGNOSIS — S22039D Unspecified fracture of third thoracic vertebra, subsequent encounter for fracture with routine healing: Secondary | ICD-10-CM | POA: Diagnosis not present

## 2022-09-06 DIAGNOSIS — N390 Urinary tract infection, site not specified: Secondary | ICD-10-CM | POA: Diagnosis present

## 2022-09-06 DIAGNOSIS — I10 Essential (primary) hypertension: Secondary | ICD-10-CM | POA: Diagnosis present

## 2022-09-06 DIAGNOSIS — M7981 Nontraumatic hematoma of soft tissue: Secondary | ICD-10-CM | POA: Diagnosis not present

## 2022-09-06 DIAGNOSIS — S22059D Unspecified fracture of T5-T6 vertebra, subsequent encounter for fracture with routine healing: Secondary | ICD-10-CM

## 2022-09-06 DIAGNOSIS — Z9049 Acquired absence of other specified parts of digestive tract: Secondary | ICD-10-CM

## 2022-09-06 DIAGNOSIS — R52 Pain, unspecified: Secondary | ICD-10-CM | POA: Diagnosis not present

## 2022-09-06 DIAGNOSIS — E876 Hypokalemia: Secondary | ICD-10-CM | POA: Diagnosis present

## 2022-09-06 DIAGNOSIS — F419 Anxiety disorder, unspecified: Secondary | ICD-10-CM | POA: Diagnosis present

## 2022-09-06 DIAGNOSIS — R630 Anorexia: Secondary | ICD-10-CM | POA: Diagnosis not present

## 2022-09-06 DIAGNOSIS — M79605 Pain in left leg: Secondary | ICD-10-CM | POA: Diagnosis present

## 2022-09-06 DIAGNOSIS — N179 Acute kidney failure, unspecified: Secondary | ICD-10-CM | POA: Diagnosis not present

## 2022-09-06 DIAGNOSIS — Z96651 Presence of right artificial knee joint: Secondary | ICD-10-CM | POA: Diagnosis present

## 2022-09-06 DIAGNOSIS — Z87891 Personal history of nicotine dependence: Secondary | ICD-10-CM

## 2022-09-06 DIAGNOSIS — R5381 Other malaise: Secondary | ICD-10-CM | POA: Diagnosis present

## 2022-09-06 DIAGNOSIS — R601 Generalized edema: Secondary | ICD-10-CM | POA: Diagnosis not present

## 2022-09-06 DIAGNOSIS — M549 Dorsalgia, unspecified: Secondary | ICD-10-CM | POA: Diagnosis present

## 2022-09-06 DIAGNOSIS — S22000A Wedge compression fracture of unspecified thoracic vertebra, initial encounter for closed fracture: Secondary | ICD-10-CM | POA: Diagnosis not present

## 2022-09-06 DIAGNOSIS — D649 Anemia, unspecified: Secondary | ICD-10-CM | POA: Diagnosis not present

## 2022-09-06 DIAGNOSIS — Z9841 Cataract extraction status, right eye: Secondary | ICD-10-CM | POA: Diagnosis not present

## 2022-09-06 DIAGNOSIS — R7989 Other specified abnormal findings of blood chemistry: Secondary | ICD-10-CM | POA: Diagnosis not present

## 2022-09-06 DIAGNOSIS — W19XXXD Unspecified fall, subsequent encounter: Secondary | ICD-10-CM | POA: Diagnosis present

## 2022-09-06 DIAGNOSIS — R410 Disorientation, unspecified: Secondary | ICD-10-CM | POA: Diagnosis not present

## 2022-09-06 DIAGNOSIS — S300XXD Contusion of lower back and pelvis, subsequent encounter: Secondary | ICD-10-CM

## 2022-09-06 DIAGNOSIS — S0003XD Contusion of scalp, subsequent encounter: Secondary | ICD-10-CM | POA: Diagnosis not present

## 2022-09-06 DIAGNOSIS — Z66 Do not resuscitate: Secondary | ICD-10-CM | POA: Diagnosis present

## 2022-09-06 DIAGNOSIS — R5383 Other fatigue: Secondary | ICD-10-CM | POA: Diagnosis not present

## 2022-09-06 DIAGNOSIS — R627 Adult failure to thrive: Secondary | ICD-10-CM | POA: Diagnosis not present

## 2022-09-06 DIAGNOSIS — Z79899 Other long term (current) drug therapy: Secondary | ICD-10-CM | POA: Diagnosis not present

## 2022-09-06 DIAGNOSIS — R451 Restlessness and agitation: Secondary | ICD-10-CM | POA: Diagnosis present

## 2022-09-06 DIAGNOSIS — S22058A Other fracture of T5-T6 vertebra, initial encounter for closed fracture: Secondary | ICD-10-CM | POA: Diagnosis not present

## 2022-09-06 DIAGNOSIS — R4181 Age-related cognitive decline: Secondary | ICD-10-CM | POA: Diagnosis not present

## 2022-09-06 DIAGNOSIS — S22009A Unspecified fracture of unspecified thoracic vertebra, initial encounter for closed fracture: Principal | ICD-10-CM | POA: Diagnosis present

## 2022-09-06 DIAGNOSIS — R443 Hallucinations, unspecified: Secondary | ICD-10-CM | POA: Diagnosis not present

## 2022-09-06 DIAGNOSIS — F05 Delirium due to known physiological condition: Secondary | ICD-10-CM | POA: Diagnosis not present

## 2022-09-06 DIAGNOSIS — Z9842 Cataract extraction status, left eye: Secondary | ICD-10-CM | POA: Diagnosis not present

## 2022-09-06 DIAGNOSIS — Z7409 Other reduced mobility: Secondary | ICD-10-CM | POA: Diagnosis present

## 2022-09-06 DIAGNOSIS — A499 Bacterial infection, unspecified: Secondary | ICD-10-CM | POA: Diagnosis not present

## 2022-09-06 LAB — CBC
HCT: 30.1 % — ABNORMAL LOW (ref 39.0–52.0)
Hemoglobin: 9.8 g/dL — ABNORMAL LOW (ref 13.0–17.0)
MCH: 30.2 pg (ref 26.0–34.0)
MCHC: 32.6 g/dL (ref 30.0–36.0)
MCV: 92.9 fL (ref 80.0–100.0)
Platelets: 336 10*3/uL (ref 150–400)
RBC: 3.24 MIL/uL — ABNORMAL LOW (ref 4.22–5.81)
RDW: 14.7 % (ref 11.5–15.5)
WBC: 9.3 10*3/uL (ref 4.0–10.5)
nRBC: 0 % (ref 0.0–0.2)

## 2022-09-06 LAB — BASIC METABOLIC PANEL
Anion gap: 8 (ref 5–15)
BUN: 24 mg/dL — ABNORMAL HIGH (ref 8–23)
CO2: 26 mmol/L (ref 22–32)
Calcium: 8.5 mg/dL — ABNORMAL LOW (ref 8.9–10.3)
Chloride: 103 mmol/L (ref 98–111)
Creatinine, Ser: 0.94 mg/dL (ref 0.61–1.24)
GFR, Estimated: >60 mL/min (ref >60)
Glucose, Bld: 104 mg/dL — ABNORMAL HIGH (ref 70–99)
Potassium: 4.5 mmol/L (ref 3.5–5.1)
Sodium: 137 mmol/L (ref 135–145)

## 2022-09-06 MED ORDER — SENNOSIDES-DOCUSATE SODIUM 8.6-50 MG PO TABS
1.0000 | ORAL_TABLET | Freq: Every day | ORAL | Status: DC
  Administered 2022-09-06 – 2022-09-17 (×11): 1 via ORAL
  Filled 2022-09-06 (×12): qty 1

## 2022-09-06 MED ORDER — POLYETHYLENE GLYCOL 3350 17 G PO PACK: 17.0000 g | PACK | Freq: Every day | ORAL | Status: DC | PRN

## 2022-09-06 MED ORDER — PROCHLORPERAZINE 25 MG RE SUPP: 12.5000 mg | Freq: Four times a day (QID) | RECTAL | Status: DC | PRN

## 2022-09-06 MED ORDER — HYDRALAZINE HCL 10 MG PO TABS
10.0000 mg | ORAL_TABLET | Freq: Three times a day (TID) | ORAL | Status: DC
  Administered 2022-09-06 – 2022-09-11 (×12): 10 mg via ORAL
  Filled 2022-09-06 (×15): qty 1

## 2022-09-06 MED ORDER — PROCHLORPERAZINE EDISYLATE 10 MG/2ML IJ SOLN: 5.0000 mg | Freq: Four times a day (QID) | INTRAMUSCULAR | Status: DC | PRN

## 2022-09-06 MED ORDER — ACETAMINOPHEN 325 MG PO TABS
325.0000 mg | ORAL_TABLET | ORAL | Status: DC | PRN
  Administered 2022-09-16: 650 mg via ORAL
  Filled 2022-09-06: qty 2

## 2022-09-06 MED ORDER — SORBITOL 70 % SOLN
30.0000 mL | Freq: Every day | Status: DC | PRN
  Administered 2022-09-17: 30 mL via ORAL
  Filled 2022-09-06: qty 30

## 2022-09-06 MED ORDER — ALUM & MAG HYDROXIDE-SIMETH 200-200-20 MG/5ML PO SUSP: 30.0000 mL | ORAL | Status: DC | PRN

## 2022-09-06 MED ORDER — FLEET ENEMA 7-19 GM/118ML RE ENEM: 1.0000 | ENEMA | Freq: Once | RECTAL | Status: DC | PRN

## 2022-09-06 MED ORDER — TRAZODONE HCL 50 MG PO TABS
25.0000 mg | ORAL_TABLET | Freq: Every evening | ORAL | Status: DC | PRN
  Administered 2022-09-07: 25 mg via ORAL
  Administered 2022-09-08 – 2022-09-12 (×3): 50 mg via ORAL
  Administered 2022-09-14: 25 mg via ORAL
  Filled 2022-09-06 (×6): qty 1

## 2022-09-06 MED ORDER — QUETIAPINE FUMARATE 25 MG PO TABS: 25.0000 mg | ORAL_TABLET | Freq: Every day | ORAL | Status: DC

## 2022-09-06 MED ORDER — APIXABAN 5 MG PO TABS
5.0000 mg | ORAL_TABLET | Freq: Two times a day (BID) | ORAL | Status: DC
  Administered 2022-09-06 – 2022-09-18 (×22): 5 mg via ORAL
  Filled 2022-09-06 (×24): qty 1

## 2022-09-06 MED ORDER — PROCHLORPERAZINE MALEATE 5 MG PO TABS: 5.0000 mg | ORAL_TABLET | Freq: Four times a day (QID) | ORAL | Status: DC | PRN

## 2022-09-06 MED ORDER — GUAIFENESIN-DM 100-10 MG/5ML PO SYRP: 5.0000 mL | ORAL_SOLUTION | Freq: Four times a day (QID) | ORAL | Status: DC | PRN

## 2022-09-06 MED ORDER — METHOCARBAMOL 500 MG PO TABS
500.0000 mg | ORAL_TABLET | Freq: Four times a day (QID) | ORAL | Status: DC | PRN
  Administered 2022-09-06 – 2022-09-11 (×3): 500 mg via ORAL
  Filled 2022-09-06 (×3): qty 1

## 2022-09-06 MED ORDER — QUETIAPINE FUMARATE 25 MG PO TABS
25.0000 mg | ORAL_TABLET | Freq: Every day | ORAL | Status: DC
  Administered 2022-09-06 – 2022-09-15 (×8): 25 mg via ORAL
  Filled 2022-09-06 (×10): qty 1

## 2022-09-06 MED ORDER — SORBITOL 70 % SOLN
30.0000 mL | Status: AC
  Administered 2022-09-06: 30 mL via ORAL
  Filled 2022-09-06: qty 30

## 2022-09-06 MED ORDER — TRAMADOL HCL 50 MG PO TABS
50.0000 mg | ORAL_TABLET | Freq: Three times a day (TID) | ORAL | Status: DC | PRN
  Administered 2022-09-07: 50 mg via ORAL
  Filled 2022-09-06: qty 1

## 2022-09-06 MED ORDER — TRAMADOL HCL 50 MG PO TABS: 50.0000 mg | ORAL_TABLET | Freq: Three times a day (TID) | ORAL | Status: DC | PRN

## 2022-09-06 NOTE — H&P (Incomplete)
Physical Medicine and Rehabilitation Admission H&P   CC: Functional deficits secondary to fall resulting in closed T6 spinal fracture  HPI: Christhoper Serrette is a 89 year old male who resides in [place] who was brought to the emergency department on 08/30/2022 after multiple falls and was found to have acute appearing compression fracture of the T6 vertebra and acute nondisplaced cortical fracture of the anterior aspect of the superior endplate of T3.  Nondisplaced spinal fracture.  He has a history of atrial fibrillation maintained on Eliquis.  CT scan of the head showed multiple small left posterior scalp hematomas and CT scan of the right hip revealed intramuscular hematoma of the gluteus maximus and soft tissue contusion.  He was treated conservatively with T LSO brace and analgesics.  Eliquis was temporarily held started on 2/10.  Blood work consistent with acute blood loss anemia and is stable and improving.  Here is requiring plus to mod assist for sit to stand from bed.  He is tolerating regular diet.The patient requires inpatient physical medicine and rehabilitation evaluations and treatment secondary to dysfunction due to thoracic spine fracture.  Review of Systems  Constitutional:  Negative for chills and fever.  Respiratory:  Negative for shortness of breath.   Cardiovascular:  Negative for chest pain.  Gastrointestinal:  Positive for constipation. Negative for abdominal pain, nausea and vomiting.  Genitourinary:  Negative for dysuria and urgency.       Has external urinary catheter  Musculoskeletal:  Positive for falls.       Left proximal thigh pain with left knee extension while sitting  Neurological:  Positive for dizziness. Negative for headaches.       Mild dizziness upon rising   Past Medical History:  Diagnosis Date   Anxiety    Arthritis    "in the fingers" per pt   Atrial fibrillation (HCC)    Bladder neck contracture    Dysrhythmia    A-FIB / HX BRADYCARDIA - FOLLOWED  BY DR. [place] DUE TO [name]   Gross hematuria    History of gout    History of prostate cancer    S/P RADIOACTIVE SEED IMPLANTS   History of squamous cell carcinoma of skin    EXCISION LOWER LIP AND RIGHT EAR   History of TIA (transient ischemic attack)    probable tia no residual  09-09-2012   Hyperlipidemia    Hypertension    Macular degeneration    both eyes   Urethral stricture    Urinary retention    Past Surgical History:  Procedure Laterality Date   APPENDECTOMY     CATARACT EXTRACTION W/ INTRAOCULAR LENS  IMPLANT, BILATERAL     CHOLECYSTECTOMY  1977   CYSTOSCOPY WITH URETHRAL DILATATION N/A 02/12/2013   Procedure: CYSTOSCOPY WITH BALLOON DILATATION OF URETHRAL STRICTURE AND BLADDER FULGERATION;  Surgeon: Bernestine Amass, MD;  Location: [facility];  Service: Urology;  Laterality: N/A;   ESOPHAGOGASTRODUODENOSCOPY (EGD) WITH PROPOFOL N/A 11/02/2020   Procedure: ESOPHAGOGASTRODUODENOSCOPY (EGD) WITH PROPOFOL;  Surgeon: Clarene Essex, MD;  Location: [facility];  Service: Endoscopy;  Laterality: N/A;   LAPAROSCOPIC APPENDECTOMY N/A 08/22/2013   Procedure: APPENDECTOMY LAPAROSCOPIC;  Surgeon: Pedro Earls, MD;  Location: [facility];  Service: General;  Laterality: N/A;   [place] N/A 11/02/2020   Procedure: SAVORY DILATION;  Surgeon: Clarene Essex, MD;  Location: [facility];  Service: Endoscopy;  Laterality: N/A;   TONSILLECTOMY  as child  TOTAL KNEE ARTHROPLASTY Right 11-23-2004   Family History  Problem Relation Age of Onset   Cancer Mother    Cancer Father    Social History:  reports that he quit smoking about 43 years ago. His smoking use included cigarettes. He has a 52.50 pack-year smoking history. He has never used smokeless tobacco. He reports current alcohol use. He reports that he does not use drugs. Allergies: No Known Allergies Medications Prior to Admission  Medication Sig Dispense Refill    acetaminophen (TYLENOL) 500 MG tablet Take 500 mg by mouth every 6 (six) hours as needed (for arthritis).     amoxicillin (AMOXIL) 500 MG capsule Take 2,000 mg by mouth See admin instructions. Take 2,000 mg by mouth one hour prior to dental appointments     ELIQUIS 5 MG TABS tablet Take 1 tablet (5 mg total) by mouth 2 (two) times daily. 60 tablet 0   Multiple Vitamins-Minerals (ICAPS AREDS 2 PO) Take 1 capsule by mouth in the morning.     Pediatric Multivit-Minerals (FLINTSTONES COMPLETE) CHEW Chew 1 tablet by mouth in the morning.     acetaminophen (TYLENOL) 325 MG tablet Take 2 tablets (650 mg total) by mouth every 6 (six) hours as needed for mild pain (or Fever >/= 101). (Patient not taking: Reported on 06/29/2022)        Home: Home Living Family/patient expects to be discharged to:: Assisted living ([facility] with [facility] for rehab) Living Arrangements: Other (Comment) (lives at [facility]) Available Help at Discharge: Other (Comment) (staff at [employer] [facility]. Son also involved) Type of Home: Assisted living Home Access: Level entry Home Layout: One level Bathroom Shower/Tub: Multimedia programmer: Handicapped height Bathroom Accessibility: Yes Home Equipment: Rollator (4 wheels) Additional Comments: [facility], has life alert button as well as pull cords in rooms.[facility] will not take pt if he cannot transfer and pivot, no hoyer lift or heavy duty DME. Memory comes and goes   Functional History: Prior Function Prior Level of Function : Needs assist  Cognitive Assist : ADLs (cognitive) ADLs (Cognitive): Intermittent cues Physical Assist : ADLs (physical) ADLs (physical): Bathing, Dressing, IADLs (donning compression socks; gets 3 baths a week) Mobility Comments: was walking 50 yards with RW with staff with him before this most recent fall; has been getting quite a bit of PT/OT recently. Lots of falls in recent history ADLs Comments: staff assisting  with bathing and supervising with dressing  Functional Status:  Mobility: Bed Mobility Overal bed mobility: Needs Assistance Bed Mobility: Rolling, Sidelying to Sit Rolling: Mod assist, +2 for physical assistance Sidelying to sit: Max assist, +2 for physical assistance General bed mobility comments: heavy cues and step by step instructions for sequencing, totalA for TLSO donning Transfers Overall transfer level: Needs assistance Equipment used: Rolling walker (2 wheels) Transfers: Sit to/from Stand Sit to Stand: Mod assist, +2 physical assistance Bed to/from chair/wheelchair/BSC transfer type:: Step pivot Stand pivot transfers: Mod assist, +2 physical assistance Step pivot transfers: Mod assist, +2 physical assistance, +2 safety/equipment, From elevated surface General transfer comment: partial stand x 2 for pressure relief from recliner, pt is used to a lift chair, limited knee flexion Ambulation/Gait General Gait Details: pt fatigued after pivot to chair and falling asleep, defer longer gait trial due to drowsiness    ADL: ADL Overall ADL's : Needs assistance/impaired Eating/Feeding: Set up, Bed level Grooming: Brushing hair, Oral care, Sitting, Minimal assistance Upper Body Bathing: Minimal assistance, Sitting Lower Body Bathing: Maximal assistance, +2 for physical  assistance, +2 for safety/equipment Upper Body Dressing : Maximal assistance, Bed level Upper Body Dressing Details (indicate cue type and reason): to don brace Lower Body Dressing: Total assistance, Sit to/from stand Lower Body Dressing Details (indicate cue type and reason): to don socks Toilet Transfer: Moderate assistance, +2 for physical assistance, +2 for safety/equipment, Stand-pivot, Rolling walker (2 wheels), BSC/3in1 Toilet Transfer Details (indicate cue type and reason): simulated in room Toileting- Clothing Manipulation and Hygiene: Minimal assistance, Sitting/lateral lean Functional mobility during  ADLs: Minimal assistance, +2 for physical assistance, +2 for safety/equipment  Cognition: Cognition Overall Cognitive Status: No family/caregiver present to determine baseline cognitive functioning Orientation Level: Oriented to person, Disoriented to place, Disoriented to time, Disoriented to situation Cognition Arousal/Alertness: Awake/alert Behavior During Therapy: WFL for tasks assessed/performed Overall Cognitive Status: No family/caregiver present to determine baseline cognitive functioning General Comments: hx of dementia, decreased awareness of deficits  Physical Exam: Blood pressure 132/79, pulse 65, temperature 98 F (36.7 C), resp. rate 18, height 6' 2"$  (1.88 m), weight 77.1 kg, SpO2 97 %. Physical Exam Constitutional:      General: He is not in acute distress. HENT:     Head: Normocephalic.     Comments: Small nontender hematoma left posterior scalp Cardiovascular:     Rate and Rhythm: Normal rate and regular rhythm.  Pulmonary:     Effort: Pulmonary effort is normal.     Breath sounds: Normal breath sounds.  Abdominal:     General: Bowel sounds are normal.     Palpations: Abdomen is soft.  Musculoskeletal:     Right lower leg: No edema.     Left lower leg: No edema.  Neurological:     General: No focal deficit present.     Mental Status: He is alert and oriented to person, place, and time.  Psychiatric:        Mood and Affect: Mood normal.        Behavior: Behavior normal.     Results for orders placed or performed during the hospital encounter of 08/30/22 (from the past 48 hour(s))  CBC     Status: Abnormal   Collection Time: 09/05/22  6:25 AM  Result Value Ref Range   WBC 7.8 4.0 - 10.5 K/uL   RBC 3.03 (L) 4.22 - 5.81 MIL/uL   Hemoglobin 9.0 (L) 13.0 - 17.0 g/dL   HCT 28.2 (L) 39.0 - 52.0 %   MCV 93.1 80.0 - 100.0 fL   MCH 29.7 26.0 - 34.0 pg   MCHC 31.9 30.0 - 36.0 g/dL   RDW 14.5 11.5 - 15.5 %   Platelets 306 150 - 400 K/uL   nRBC 0.0 0.0 - 0.2 %     Comment: Performed at [facility], [place] [address] [place], [place] [address]  CBC     Status: Abnormal   Collection Time: 09/06/22  2:32 AM  Result Value Ref Range   WBC 9.3 4.0 - 10.5 K/uL   RBC 3.24 (L) 4.22 - 5.81 MIL/uL   Hemoglobin 9.8 (L) 13.0 - 17.0 g/dL   HCT 30.1 (L) 39.0 - 52.0 %   MCV 92.9 80.0 - 100.0 fL   MCH 30.2 26.0 - 34.0 pg   MCHC 32.6 30.0 - 36.0 g/dL   RDW 14.7 11.5 - 15.5 %   Platelets 336 150 - 400 K/uL   nRBC 0.0 0.0 - 0.2 %    Comment: Performed at [facility], [place] [address], [place], [place] [address]  Basic metabolic panel     Status: Abnormal   Collection Time: 09/06/22  2:32 AM  Result Value Ref Range   Sodium 137 135 - 145 mmol/L   Potassium 4.5 3.5 - 5.1 mmol/L   Chloride 103 98 - 111 mmol/L   CO2 26 22 - 32 mmol/L   Glucose, Bld 104 (H) 70 - 99 mg/dL    Comment: Glucose reference range applies only to samples taken after fasting for at least 8 hours.   BUN 24 (H) 8 - 23 mg/dL   Creatinine, Ser 0.94 0.61 - 1.24 mg/dL   Calcium 8.5 (L) 8.9 - 10.3 mg/dL   GFR, Estimated >60 >60 mL/min    Comment: (NOTE) Calculated using the CKD-EPI Creatinine Equation (2021)    Anion gap 8 5 - 15    Comment: Performed at [facility] [address], [place], [place] [address]   No results found.    Blood pressure 132/79, pulse 65, temperature 98 F (36.7 C), resp. rate 18, height 6' 2"$  (1.88 m), weight 77.1 kg, SpO2 97 %.  Medical Problem List and Plan: 1. Functional deficits secondary to ***  -patient may *** shower  -ELOS/Goals: ***  2.  Antithrombotics: -DVT/anticoagulation:  Pharmaceutical: Eliquis  -antiplatelet therapy:   3. Pain Management: Tylenol as needed; tramadol as needed  4. Mood/Behavior/Sleep: LCSW to evaluate and provide emotional support  -antipsychotic agents: Seroquel 25 mg q HS  5. Neuropsych/cognition: This patient is capable of making decisions on his own behalf.  6. Skin/Wound Care: Routine skin  care checks   7. Fluids/Electrolytes/Nutrition: Routine Is and Os and follow-up chemistries  8: Hypertension: monitor TID and prn  -continue hydralazine 10 mg TID and monitor with ambulation  9: Mildly elevated BUN: encourage PO liquids; follow-up BMP  10: Anemia; Recent drop likely due to hematoma  -follow-up CBC  11: Constipation: will give Sorbitol tonight    ***  Barbie Banner, PA-C 09/06/2022

## 2022-09-06 NOTE — Progress Notes (Signed)
Physical Therapy Treatment Patient Details Name: Justin Potter MRN: [id number] DOB: 09-18-1925 Today's Date: 09/06/2022   History of Present Illness 89 y/o male who was brought to the ED on 08/30/22 after a fall at home. Has been having recurrent falls for past 2 months. CT thoracic spine shows acute T3 and T6 fractures. R hip imaging with intramuscular hematoma in glute max. PMH Afib, gout, prostate CA, TIA, HLD, HTN, macular degeneration, TKA.    PT Comments    Pt received in chair, calling out to PTA for assistance due to discomfort, pt premedicated by RN ~45 mins prior to session. Pt repositioned for comfort in chair and PTA reviewed seated LE exercises for strengthening/ROM and pain relief as well as safe technique to reposition in chair for pressure relief. No +2 assist available at time of session and MD from rehab dept entering room for extended discussion with pt so RN notified safe technique for pt return transfer to supine at end of session. Pt continues to benefit from PT services to progress toward functional mobility goals.    Recommendations for follow up therapy are one component of a multi-disciplinary discharge planning process, led by the attending physician.  Recommendations may be updated based on patient status, additional functional criteria and insurance authorization.  Follow Up Recommendations  Acute inpatient rehab (3hours/day)     Assistance Recommended at Discharge Frequent or constant Supervision/Assistance  Patient can return home with the following A lot of help with bathing/dressing/bathroom;Direct supervision/assist for medications management;Two people to help with bathing/dressing/bathroom;Two people to help with walking and/or transfers;Help with stairs or ramp for entrance;Assistance with feeding;Assistance with cooking/housework   Equipment Recommendations  Other (comment) (TBD)    Recommendations for Other Services       Precautions / Restrictions  Precautions Precautions: Fall;Back Precaution Booklet Issued: Yes (comment) Precaution Comments: reviewed back precautions with pt, handout in room for his son, pt can't read handout due to visual deficit Required Braces or Orthoses: Spinal Brace Spinal Brace: Thoracolumbosacral orthotic;Applied in sitting position Restrictions Weight Bearing Restrictions: No     Mobility  Bed Mobility Overal bed mobility: Needs Assistance Bed Mobility: Rolling, Sidelying to Sit Rolling: Max assist, +2 for safety/equipment Sidelying to sit: Max assist, +2 for physical assistance       General bed mobility comments: received in chair    Transfers Overall transfer level: Needs assistance Equipment used: Rolling walker (2 wheels) Transfers: Sit to/from Stand Sit to Stand: From elevated surface, Mod assist, +2 physical assistance           General transfer comment: partial stand from chair (squat) for repositioning hips in chair/lateral scooting in chair to adjust pillow under his hips Transfer via Lift Equipment: Stedy  Ambulation/Gait Ambulation/Gait assistance: Mod assist, +2 safety/equipment   Assistive device:  (Stedy frame)       Pre-gait activities: hip flexion x10 reps BLE, seated break after initial 5 reps       Balance Overall balance assessment: Needs assistance, History of Falls Sitting-balance support: Feet supported, Bilateral upper extremity supported Sitting balance-Leahy Scale: Fair     Standing balance support: Bilateral upper extremity supported, During functional activity, Reliant on assistive device for balance Standing balance-Leahy Scale: Poor Standing balance comment: pt sitting in chair, MD entering room to speak with him at end of session, pt defers return transfer to bed until after speaking with MD.  Cognition Arousal/Alertness: Awake/alert Behavior During Therapy: WFL for tasks assessed/performed Overall  Cognitive Status: No family/caregiver present to determine baseline cognitive functioning                                 General Comments: Hx of dementia, pt surprised to hear he has only been sitting in chair for ~45 mins when he called PTA into room and c/o discomfort. Increased time and effort for following commands. Emphasis on reorientation to time/situation and repositioning for comfort.        Exercises General Exercises - Lower Extremity Ankle Circles/Pumps: AROM, Both, 10 reps, Supine Long Arc Quad: AROM, Both, 10 reps, Seated (decreased ROM LLE due to c/o severe L thigh pain, MD notified who came to room during session) Heel Slides:  (~3 reps ea (pain limiting)) Hip ABduction/ADduction: AROM, Both, 10 reps, Seated Hip Flexion/Marching: AROM, Both, Strengthening, 10 reps, Seated (pt chair slightly reclined to prevent excessive bending given back precs) Other Exercises Other Exercises: chair push-ups x2 trials (limited lift achieved)    General Comments General comments (skin integrity, edema, etc.): HR 60's bpm sitting in chair      Pertinent Vitals/Pain Pain Assessment Pain Assessment: PAINAD Faces Pain Scale: Hurts even more Breathing: normal Negative Vocalization: occasional moan/groan, low speech, negative/disapproving quality Facial Expression: facial grimacing Body Language: relaxed Consolability: distracted or reassured by voice/touch PAINAD Score: 4 Pain Location: L knee/quad, L lower back/hip, generalized Pain Descriptors / Indicators: Tender, Grimacing, Guarding, Sore Pain Intervention(s): Monitored during session, Premedicated before session, Limited activity within patient's tolerance, Repositioned     PT Goals (current goals can now be found in the care plan section) Acute Rehab PT Goals Patient Stated Goal: To get stronger at rehab and less pain PT Goal Formulation: With patient/family Time For Goal Achievement: 09/15/22 Progress towards  PT goals: Progressing toward goals    Frequency    Min 3X/week      PT Plan Current plan remains appropriate       AM-PAC PT "6 Clicks" Mobility   Outcome Measure  Help needed turning from your back to your side while in a flat bed without using bedrails?: A Lot Help needed moving from lying on your back to sitting on the side of a flat bed without using bedrails?: A Lot Help needed moving to and from a bed to a chair (including a wheelchair)?: A Lot Help needed standing up from a chair using your arms (e.g., wheelchair or bedside chair)?: Total Help needed to walk in hospital room?: Total Help needed climbing 3-5 steps with a railing? : Total 6 Click Score: 9    End of Session Equipment Utilized During Treatment: Gait belt;Back brace Activity Tolerance: Patient limited by pain;Other (comment) (session time limited due to arrival of MD) Patient left: in chair;with call bell/phone within reach;with chair alarm set;Other (comment) (MD in room) Nurse Communication: Mobility status;Need for lift equipment;Other (comment) (safe technique for return transfer; MD notified that pain meds previously given by RN ~1 hr prior did not seem to improve his pain score compared with recent session where he was unmedicated) PT Visit Diagnosis: Unsteadiness on feet (R26.81);Difficulty in walking, not elsewhere classified (R26.2);Repeated falls (R29.6);Muscle weakness (generalized) (M62.81)     Time: [id number] PT Time Calculation (min) (ACUTE ONLY): 12 min  Charges:  $Therapeutic Activity: 8-22 mins  Houston Siren., PTA Acute Rehabilitation Services Secure Chat Preferred 9a-5:30pm Office: [place] 09/06/2022, 5:31 PM

## 2022-09-06 NOTE — Progress Notes (Signed)
Physical Therapy Treatment Patient Details Name: Justin Potter MRN: [id number] DOB: [birth year] Today's Date: 09/06/2022   History of Present Illness 89 y/o male who was brought to the ED on 08/30/22 after a fall at home. Has been having recurrent falls for past 2 months. CT thoracic spine shows acute T3 and T6 fractures. R hip imaging with intramuscular hematoma in glute max. PMH Afib, gout, prostate CA, TIA, HLD, HTN, macular degeneration, TKA.    PT Comments    Pt received in supine, c/o pain but agreeable to therapy session with encouragement. Pt reports pain slightly relieved with repositioning. Pt benefits from dense multimodal cues and performed functional mobility tasks with up to +2 modA (for transfers and pre-gait standing exercises in [place] frame). Pt set up in recliner to eat his lunch at end of session, RN notified he will need assist for eating due to visual deficit and that he requests pain meds. Pt continues to benefit from PT services to progress toward functional mobility goals.   Recommendations for follow up therapy are one component of a multi-disciplinary discharge planning process, led by the attending physician.  Recommendations may be updated based on patient status, additional functional criteria and insurance authorization.  Follow Up Recommendations  Acute inpatient rehab (3hours/day)     Assistance Recommended at Discharge Frequent or constant Supervision/Assistance  Patient can return home with the following A lot of help with bathing/dressing/bathroom;Direct supervision/assist for medications management;Two people to help with bathing/dressing/bathroom;Two people to help with walking and/or transfers;Help with stairs or ramp for entrance;Assistance with feeding;Assistance with cooking/housework   Equipment Recommendations  Other (comment) (TBD post-acute)    Recommendations for Other Services Rehab consult     Precautions / Restrictions  Precautions Precautions: Fall;Back Precaution Booklet Issued: Yes (comment) Precaution Comments: reviewed back precautions with pt, handout in room for his son, pt can't read handout due to visual deficit Required Braces or Orthoses: Spinal Brace Spinal Brace: Thoracolumbosacral orthotic;Applied in sitting position Restrictions Weight Bearing Restrictions: No     Mobility  Bed Mobility Overal bed mobility: Needs Assistance Bed Mobility: Rolling, Sidelying to Sit Rolling: Max assist, +2 for safety/equipment Sidelying to sit: Max assist, +2 for physical assistance       General bed mobility comments: heavy cues and step by step instructions for sequencing, totalA for TLSO donning    Transfers Overall transfer level: Needs assistance Equipment used: Rolling walker (2 wheels) Transfers: Sit to/from Stand Sit to Stand: From elevated surface, Mod assist, +2 physical assistance           General transfer comment: from elevated bed<>Stedy and Stedy seat with up to +2 modA. Anticipate he will need increased assist from lower recliner chair height so Stedy left in room for nursing staff to assist him back. Transfer via Lift Equipment: Stedy  Ambulation/Gait Ambulation/Gait assistance: Mod assist, +2 safety/equipment   Assistive device:  (Stedy frame)       Pre-gait activities: hip flexion x10 reps BLE, seated break after initial 5 reps     Stairs             Wheelchair Mobility    Modified Rankin (Stroke Patients Only)       Balance Overall balance assessment: Needs assistance, History of Falls Sitting-balance support: Feet supported, Bilateral upper extremity supported Sitting balance-Leahy Scale: Fair     Standing balance support: Bilateral upper extremity supported, During functional activity, Reliant on assistive device for balance Standing balance-Leahy Scale: Poor Standing balance comment: +1-2 modA for  static standing and +2 external assist with BUE  support for dynamic standing tasks (marching in [place])                            Cognition Arousal/Alertness: Awake/alert Behavior During Therapy: WFL for tasks assessed/performed Overall Cognitive Status: No family/caregiver present to determine baseline cognitive functioning                                 General Comments: hx of dementia, pt agreeable to participate with encouragement and expressed surprise at symptom improvement after pt transferred to sitting and OOB. Pt anxious regarding mobility but cooperative.        Exercises General Exercises - Lower Extremity Ankle Circles/Pumps: AROM, Both, 10 reps, Supine Long Arc Quad: AROM, Both, 10 reps, Seated (x5 reps ea x2 sets) Heel Slides: AAROM, Both, Supine (~3 reps ea (pain limiting)) Hip Flexion/Marching: AROM, Both, 10 reps, Standing, Strengthening (x5 reps x 2 sets with seated break between sets; standing in Stedy frame) Other Exercises Other Exercises: STS x 2 trials and static standing x2 mins for BLE strengthening    General Comments General comments (skin integrity, edema, etc.): Pt reports he is in significant pain when received in supine in his lower back but reports some improvement in pain with upright seated posture; HR WFL.      Pertinent Vitals/Pain Pain Assessment Pain Assessment: Faces Faces Pain Scale: Hurts even more Pain Location: L knee, L lower back/hip, generalized Pain Descriptors / Indicators: Tender, Grimacing, Guarding, Sore Pain Intervention(s): Limited activity within patient's tolerance, Monitored during session, Repositioned, Patient requesting pain meds-RN notified, Other (comment) (geomat pressure relief cushion in chair)           PT Goals (current goals can now be found in the care plan section) Acute Rehab PT Goals Patient Stated Goal: To get stronger at rehab and less pain PT Goal Formulation: With patient/family Time For Goal Achievement:  09/15/22 Progress towards PT goals: Progressing toward goals    Frequency    Min 3X/week      PT Plan Current plan remains appropriate    Co-evaluation PT/OT/SLP Co-Evaluation/Treatment: Yes Reason for Co-Treatment: For patient/therapist safety;To address functional/ADL transfers PT goals addressed during session: Mobility/safety with mobility;Balance;Strengthening/ROM        AM-PAC PT "6 Clicks" Mobility   Outcome Measure  Help needed turning from your back to your side while in a flat bed without using bedrails?: A Lot Help needed moving from lying on your back to sitting on the side of a flat bed without using bedrails?: A Lot Help needed moving to and from a bed to a chair (including a wheelchair)?: A Lot Help needed standing up from a chair using your arms (e.g., wheelchair or bedside chair)?: Total (from standard height chair needs +2 maxA) Help needed to walk in hospital room?: Total Help needed climbing 3-5 steps with a railing? : Total 6 Click Score: 9    End of Session Equipment Utilized During Treatment: Gait belt;Back brace Activity Tolerance: Patient tolerated treatment well;Patient limited by pain Patient left: in chair;with call bell/phone within reach;with chair alarm set;with nursing/sitter in room;Other (comment) (RN entering room to give pain meds) Nurse Communication: Mobility status;Need for lift equipment;Other (comment) (he will need assist to self-feed (lunch tray set up for him by therapists) due to macular degeneration/visual deficit) PT Visit Diagnosis: Unsteadiness on feet (  R26.81);Difficulty in walking, not elsewhere classified (R26.2);Repeated falls (R29.6);Muscle weakness (generalized) (M62.81)     Time: [id number] PT Time Calculation (min) (ACUTE ONLY): 26 min  Charges:  $Therapeutic Exercise: 8-22 mins                     Braelen Sproule P., PTA Acute Rehabilitation Services Secure Chat Preferred 9a-5:30pm Office: [place] 09/06/2022, 2:01 PM

## 2022-09-06 NOTE — Discharge Summary (Signed)
Physician Discharge Summary  Justin Potter [id number] DOB: 09/11/1925 DOA: 08/30/2022  PCP: [name], MD  Admit date: 08/30/2022 Discharge date: 09/06/2022  Admitted From: Home Disposition: Inpatient rehab   Recommendations for Outpatient Follow-up:  Follow up for outpatient rehab.  Home Health:no Equipment/Devices:none  Discharge Condition:Guarded CODE STATUS:DNR Diet recommendation: Heart Healthy   Brief/Interim Summary: 89 year old with past medical history of essential hypertension hyperlipidemia, rate controlled A-fib off AV nodal agents on Eliquis, TIA, history of prostate cancer who at baseline feeds himself and resides in [place] came into the ED after multiple falls was found to have a closed T6-T3 nondisplaced spinal fracture  Discharge Diagnoses:  Principal Problem:   Closed T6 spinal fracture ([place]) Active Problems:   Persistent atrial fibrillation ([place])   Essential hypertension   Hematoma and contusion   AKI (acute kidney injury) ([place])   Leukocytosis   Closed T3 spinal fracture ([facility])   Anemia   Thoracic spine fracture ([facility])  Acute close T6 and T3 nondisplaced spinal fracture: He was treated conservatively with TLC on analgesics. Physical therapy evaluated the patient recommended inpatient rehab. Who continue current regimen and will will also continue a bowel regimen.  Posterior scalp hematoma/intramuscular hematoma of the gluteus maximus and soft tissue contusion: Eliquis was held temporarily hemoglobin remained stable he will resume Eliquis.  Recurrent falls with fractures: At baseline uses a walker physical therapy evaluated the patient he will go to inpatient rehab.  Chronic atrial fibrillation On no AV nodal agents rate controlled sometimes slow ventricular response asymptomatic. Initially Eliquis was held he was resumed. He will continue it as an outpatient hemoglobin remained stable.  Essential hypertension: No changes made to his  medication continue current regimen.  Mild normocytic anemia: Baseline hemoglobin around 12, dropped to 9 Eliquis was held hemoglobin remained stable he can resume this as an outpatient.  Mild acute kidney injury: With a baseline creatinine around 1 and admission 1.3 was hydrated his creatinine returned to baseline. Sundowning/agitation: He is treated with Seroquel as needed at night.   Discharge Instructions  Discharge Instructions     Diet - low sodium heart healthy   Complete by: As directed    Increase activity slowly   Complete by: As directed       Allergies as of 09/06/2022   No Known Allergies      Medication List     STOP taking these medications    amoxicillin 500 MG capsule Commonly known as: AMOXIL       TAKE these medications    acetaminophen 500 MG tablet Commonly known as: TYLENOL Take 500 mg by mouth every 6 (six) hours as needed (for arthritis).   acetaminophen 325 MG tablet Commonly known as: TYLENOL Take 2 tablets (650 mg total) by mouth every 6 (six) hours as needed for mild pain (or Fever >/= 101).   Eliquis 5 MG Tabs tablet Generic drug: apixaban Take 1 tablet (5 mg total) by mouth 2 (two) times daily.   Flintstones Complete Chew Chew 1 tablet by mouth in the morning.   ICAPS AREDS 2 PO Take 1 capsule by mouth in the morning.   QUEtiapine 25 MG tablet Commonly known as: SEROQUEL Take 1 tablet (25 mg total) by mouth at bedtime.   traMADol 50 MG tablet Commonly known as: ULTRAM Take 1 tablet (50 mg total) by mouth every 8 (eight) hours as needed for moderate pain.        No Known Allergies  Consultations: None  Procedures/Studies: CT Hip Right Wo Contrast  Result Date: 08/30/2022 CLINICAL DATA:  Hip trauma with fracture suspected. X-ray done. Mechanical fall. Level 2. EXAM: CT OF THE RIGHT HIP WITHOUT CONTRAST TECHNIQUE: Multidetector CT imaging of the right hip was performed according to the standard protocol.  Multiplanar CT image reconstructions were also generated. RADIATION DOSE REDUCTION: This exam was performed according to the departmental dose-optimization program which includes automated exposure control, adjustment of the mA and/or kV according to patient size and/or use of iterative reconstruction technique. COMPARISON:  Pelvis radiographs 08/30/2022 FINDINGS: Bones/Joint/Cartilage Degenerative changes in the right hip with narrowing and sclerosis of the acetabular joint and small osteophyte formation. No evidence of acute fracture or dislocation. Visualized portion of the right hemipelvis and right sacrum are unremarkable. Ligaments Suboptimally assessed by CT. Muscles and Tendons Fatty atrophy of the hip musculature. Mild expansion and increased density in the inferior aspect of the gluteus maximus muscles suggesting intramuscular hematoma measuring up to about 5 cm in diameter. Soft tissues Soft tissue swelling and infiltration in the subcutaneous fat lateral and posterior to the right hip likely representing contusion. No loculated soft tissue collections are otherwise demonstrated. Incidental note of seed implants in the prostate gland. IMPRESSION: 1. No acute displaced fractures demonstrated in the right hip. 2. Degenerative changes in the right hip. 3. Soft tissue changes consistent with intramuscular hematoma in the gluteus maximus muscle and soft tissue contusion in the surrounding fat. Electronically Signed   By: Lucienne Capers M.D.   On: 08/30/2022 22:45   CT LUMBAR SPINE WO CONTRAST  Result Date: 08/30/2022 CLINICAL DATA:  Fall. EXAM: CT THORACIC AND LUMBAR SPINE WITHOUT CONTRAST TECHNIQUE: Multidetector CT imaging of the thoracic and lumbar spine was performed without contrast. Multiplanar CT image reconstructions were also generated. RADIATION DOSE REDUCTION: This exam was performed according to the departmental dose-optimization program which includes automated exposure control, adjustment of  the mA and/or kV according to patient size and/or use of iterative reconstruction technique. COMPARISON:  Chest radiograph dated 06/29/2022. FINDINGS: CT THORACIC SPINE FINDINGS Alignment: No acute subluxation. Vertebrae: Evaluation for fracture is limited due to osteopenia. There is a nondisplaced corner fracture of the anterior aspect of superior endplate of T3 (sagittal 59/13). There is compression fracture of the T6 vertebra with fracture line extending to the anterior cortex and superior endplate. This is likely acute. Correlation with point tenderness recommended. No significant loss of vertebral body height. No retropulsion. Paraspinal and other soft tissues: No perispinal fluid collection or hematoma. Left lung base atelectasis/scarring. Partially visualized small pericardial effusion measuring up to 13 mm in thickness. Disc levels: No acute findings.  Degenerative changes. CT LUMBAR SPINE FINDINGS Segmentation: 5 lumbar type vertebrae. Alignment: No acute subluxation. Vertebrae: No acute fracture.  Osteopenia. Paraspinal and other soft tissues: No paraspinal fluid collection or hematoma. Atherosclerotic calcification of the aorta. Sigmoid diverticulosis. Disc levels: No acute findings.  Degenerative changes. IMPRESSION: 1. Acute appearing compression fracture of the T6 vertebra. 2. Acute nondisplaced corner fracture of the anterior aspect of the superior endplate of T3. 3. No acute/traumatic lumbar spine pathology. 4. Partially visualized small pericardial effusion. 5.  Aortic Atherosclerosis (ICD10-I70.0). Electronically Signed   By: Anner Crete M.D.   On: 08/30/2022 21:31   CT THORACIC SPINE WO CONTRAST  Result Date: 08/30/2022 CLINICAL DATA:  Fall. EXAM: CT THORACIC AND LUMBAR SPINE WITHOUT CONTRAST TECHNIQUE: Multidetector CT imaging of the thoracic and lumbar spine was performed without contrast. Multiplanar CT image reconstructions were also generated. RADIATION  DOSE REDUCTION: This exam was  performed according to the departmental dose-optimization program which includes automated exposure control, adjustment of the mA and/or kV according to patient size and/or use of iterative reconstruction technique. COMPARISON:  Chest radiograph dated 06/29/2022. FINDINGS: CT THORACIC SPINE FINDINGS Alignment: No acute subluxation. Vertebrae: Evaluation for fracture is limited due to osteopenia. There is a nondisplaced corner fracture of the anterior aspect of superior endplate of T3 (sagittal 59/13). There is compression fracture of the T6 vertebra with fracture line extending to the anterior cortex and superior endplate. This is likely acute. Correlation with point tenderness recommended. No significant loss of vertebral body height. No retropulsion. Paraspinal and other soft tissues: No perispinal fluid collection or hematoma. Left lung base atelectasis/scarring. Partially visualized small pericardial effusion measuring up to 13 mm in thickness. Disc levels: No acute findings.  Degenerative changes. CT LUMBAR SPINE FINDINGS Segmentation: 5 lumbar type vertebrae. Alignment: No acute subluxation. Vertebrae: No acute fracture.  Osteopenia. Paraspinal and other soft tissues: No paraspinal fluid collection or hematoma. Atherosclerotic calcification of the aorta. Sigmoid diverticulosis. Disc levels: No acute findings.  Degenerative changes. IMPRESSION: 1. Acute appearing compression fracture of the T6 vertebra. 2. Acute nondisplaced corner fracture of the anterior aspect of the superior endplate of T3. 3. No acute/traumatic lumbar spine pathology. 4. Partially visualized small pericardial effusion. 5.  Aortic Atherosclerosis (ICD10-I70.0). Electronically Signed   By: Anner Crete M.D.   On: 08/30/2022 21:31   CT HEAD WO CONTRAST  Result Date: 08/30/2022 CLINICAL DATA:  Trauma fall EXAM: CT HEAD WITHOUT CONTRAST CT CERVICAL SPINE WITHOUT CONTRAST TECHNIQUE: Multidetector CT imaging of the head and cervical spine  was performed following the standard protocol without intravenous contrast. Multiplanar CT image reconstructions of the cervical spine were also generated. RADIATION DOSE REDUCTION: This exam was performed according to the departmental dose-optimization program which includes automated exposure control, adjustment of the mA and/or kV according to patient size and/or use of iterative reconstruction technique. COMPARISON:  CT brain and cervical spine 06/28/2022 FINDINGS: CT HEAD FINDINGS Brain: No acute territorial infarction, hemorrhage or intracranial mass. 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 Atrophy. Mild chronic small vessel ischemic changes of the white matter. Stable ventricle size Vascular: No hyperdense vessels.  Carotid vascular calcification. Skull: Normal. Negative for fracture or focal lesion. Sinuses/Orbits: No acute finding. Other: Small left posterior scalp hematoma. Small fluid in the inferior mastoids CT CERVICAL SPINE FINDINGS Alignment: No subluxation.  Facet alignment is within normal limits. Skull base and vertebrae: No acute fracture. No primary bone lesion or focal pathologic process. Soft tissues and spinal canal: No prevertebral fluid or swelling. No visible canal hematoma. Disc levels: Multilevel degenerative changes, worst at C5-C6 and C6-C7. Hypertrophic facet degenerative changes at multiple levels. Upper chest: Negative. Other: None IMPRESSION: No CT evidence for acute intracranial abnormality. Atrophy and chronic small vessel ischemic changes of the white matter. 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 Degenerative changes of the cervical spine. No acute osseous abnormality. Electronically Signed   By: Donavan Foil M.D.   On: 08/30/2022 21:27   CT CERVICAL SPINE WO CONTRAST  Result Date: 08/30/2022 CLINICAL DATA:  Trauma fall EXAM: CT HEAD WITHOUT CONTRAST CT CERVICAL SPINE WITHOUT CONTRAST TECHNIQUE: Multidetector CT imaging of the head and cervical spine  was performed following the standard protocol without intravenous contrast. Multiplanar CT image reconstructions of the cervical spine were also generated. RADIATION DOSE REDUCTION: This exam was performed according to the departmental dose-optimization program which includes automated  exposure control, adjustment of the mA and/or kV according to patient size and/or use of iterative reconstruction technique. COMPARISON:  CT brain and cervical spine 06/28/2022 FINDINGS: CT HEAD FINDINGS Brain: No acute territorial infarction, hemorrhage or intracranial mass. 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 Atrophy. Mild chronic small vessel ischemic changes of the white matter. Stable ventricle size Vascular: No hyperdense vessels.  Carotid vascular calcification. Skull: Normal. Negative for fracture or focal lesion. Sinuses/Orbits: No acute finding. Other: Small left posterior scalp hematoma. Small fluid in the inferior mastoids CT CERVICAL SPINE FINDINGS Alignment: No subluxation.  Facet alignment is within normal limits. Skull base and vertebrae: No acute fracture. No primary bone lesion or focal pathologic process. Soft tissues and spinal canal: No prevertebral fluid or swelling. No visible canal hematoma. Disc levels: Multilevel degenerative changes, worst at C5-C6 and C6-C7. Hypertrophic facet degenerative changes at multiple levels. Upper chest: Negative. Other: None IMPRESSION: No CT evidence for acute intracranial abnormality. Atrophy and chronic small vessel ischemic changes of the white matter. 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 Degenerative changes of the cervical spine. No acute osseous abnormality. Electronically Signed   By: Donavan Foil M.D.   On: 08/30/2022 21:27   DG Shoulder Left Port  Result Date: 08/30/2022 CLINICAL DATA:  Blunt trauma, fall with left shoulder pain EXAM: LEFT SHOULDER COMPARISON:  None Available. FINDINGS: Mild degenerative AC joint spurring. Mild spurring  of the inferior glenoid and humeral head. Subacromial morphology is type 2 (curved). No fracture or malalignment observed. IMPRESSION: 1. Mild degenerative findings in the shoulder. No fracture or dislocation identified. Electronically Signed   By: Van Clines M.D.   On: 08/30/2022 21:02   DG Pelvis Portable  Result Date: 08/30/2022 CLINICAL DATA:  Fall, right hip pain EXAM: PORTABLE PELVIS 1-2 VIEWS COMPARISON:  03/28/2022 MRI of the right hip FINDINGS: Brachytherapy seed implants in the prostate gland. Mild degenerative hip arthropathy bilaterally including articular space narrowing and mild spurring of the femoral heads. No pelvic fracture or visible acute bony finding. Prominent stool throughout the colon favors constipation. IMPRESSION: 1. No pelvic fracture or visible acute bony finding. 2. Mild degenerative hip arthropathy bilaterally. 3. Prominent stool throughout the colon favors constipation. Electronically Signed   By: Van Clines M.D.   On: 08/30/2022 21:00   DG Chest Port 1 View  Result Date: 08/30/2022 CLINICAL DATA:  Mechanical fall, left thoracic pain EXAM: PORTABLE CHEST 1 VIEW COMPARISON:  06/28/2022 FINDINGS: Mild to moderate enlargement of the cardiopericardial silhouette, stable. No edema. No blunting of the costophrenic angles. The lungs appear clear. No pneumothorax or well-defined rib discontinuity. IMPRESSION: 1. No acute findings. 2. Mild to moderate enlargement of the cardiopericardial silhouette, without edema. Electronically Signed   By: Van Clines M.D.   On: 08/30/2022 20:58   (Echo, Carotid, EGD, Colonoscopy, ERCP)    Subjective: No complaints  Discharge Exam: Vitals:   09/06/22 0613 09/06/22 0833  BP: 125/73 132/79  Pulse: 60 65  Resp: 16 18  Temp: 98.3 F (36.8 C) 98 F (36.7 C)  SpO2: 97% 97%   Vitals:   09/05/22 2034 09/05/22 2314 09/06/22 0613 09/06/22 0833  BP: (!) 144/63 (!) 138/59 125/73 132/79  Pulse: 74 73 60 65  Resp: 14 19  16 18  $ Temp: 98 F (36.7 C) 97.9 F (36.6 C) 98.3 F (36.8 C) 98 F (36.7 C)  TempSrc: Oral Oral Oral   SpO2: 98% 97% 97% 97%  Weight:      Height:  General: Pt is alert, awake, not in acute distress Cardiovascular: RRR, S1/S2 +, no rubs, no gallops Respiratory: CTA bilaterally, no wheezing, no rhonchi Abdominal: Soft, NT, ND, bowel sounds + Extremities: no edema, no cyanosis    The results of significant diagnostics from this hospitalization (including imaging, microbiology, ancillary and laboratory) are listed below for reference.     Microbiology: No results found for this or any previous visit (from the past 240 hour(s)).   Labs: BNP (last 3 results) No results for input(s): "BNP" in the last 8760 hours. Basic Metabolic Panel: Recent Labs  Lab 08/30/22 2023 08/30/22 2028 08/31/22 0307 09/03/22 0744 09/04/22 0326 09/06/22 0232  NA 138 139 136 138 138 137  K 4.2 4.3 3.8 3.7 3.9 4.5  CL 103 102 102 105 105 103  CO2 26  --  25 24 24 26  $ GLUCOSE 133* 125* 122* 102* 107* 104*  BUN 31* 30* 28* 17 18 24*  CREATININE 1.36* 1.40* 0.96 0.76 0.77 0.94  CALCIUM 8.8*  --  8.5* 8.3* 8.3* 8.5*   Liver Function Tests: Recent Labs  Lab 08/30/22 2023  AST 24  ALT 15  ALKPHOS 61  BILITOT 0.2*  PROT 6.2*  ALBUMIN 3.4*   No results for input(s): "LIPASE", "AMYLASE" in the last 168 hours. No results for input(s): "AMMONIA" in the last 168 hours. CBC: Recent Labs  Lab 08/31/22 0307 09/03/22 0744 09/04/22 0326 09/05/22 0625 09/06/22 0232  WBC 12.2* 8.9 9.3 7.8 9.3  HGB 10.4* 8.7* 9.2* 9.0* 9.8*  HCT 32.8* 27.8* 27.9* 28.2* 30.1*  MCV 93.4 93.3 92.7 93.1 92.9  PLT 205 242 250 306 336   Cardiac Enzymes: Recent Labs  Lab 08/30/22 2023  CKTOTAL 119   BNP: Invalid input(s): "POCBNP" CBG: No results for input(s): "GLUCAP" in the last 168 hours. D-Dimer No results for input(s): "DDIMER" in the last 72 hours. Hgb A1c No results for input(s): "HGBA1C" in  the last 72 hours. Lipid Profile No results for input(s): "CHOL", "HDL", "LDLCALC", "TRIG", "CHOLHDL", "LDLDIRECT" in the last 72 hours. Thyroid function studies No results for input(s): "TSH", "T4TOTAL", "T3FREE", "THYROIDAB" in the last 72 hours.  Invalid input(s): "FREET3" Anemia work up No results for input(s): "VITAMINB12", "FOLATE", "FERRITIN", "TIBC", "IRON", "RETICCTPCT" in the last 72 hours. Urinalysis    Component Value Date/Time   COLORURINE YELLOW 08/31/2022 0009   APPEARANCEUR CLOUDY (A) 08/31/2022 0009   LABSPEC 1.018 08/31/2022 0009   PHURINE 8.0 08/31/2022 0009   GLUCOSEU NEGATIVE 08/31/2022 0009   HGBUR NEGATIVE 08/31/2022 0009   BILIRUBINUR NEGATIVE 08/31/2022 0009   KETONESUR 5 (A) 08/31/2022 0009   PROTEINUR NEGATIVE 08/31/2022 0009   UROBILINOGEN 0.2 08/20/2013 1632   NITRITE NEGATIVE 08/31/2022 0009   LEUKOCYTESUR MODERATE (A) 08/31/2022 0009   Sepsis Labs Recent Labs  Lab 09/03/22 0744 09/04/22 0326 09/05/22 0625 09/06/22 0232  WBC 8.9 9.3 7.8 9.3   Microbiology No results found for this or any previous visit (from the past 240 hour(s)).   SIGNED:   Charlynne Cousins, MD  [facility] 09/06/2022, 8:59 AM Pager   If 7PM-7AM, please contact night-coverage [email or web address] Password [email or web address]

## 2022-09-06 NOTE — Progress Notes (Signed)
PMR Admission Coordinator Pre-Admission Assessment   Patient: Justin Potter is an 89 y.o., male MRN: [id number] DOB: 10-Dec-1925 Height: 6' 2"$  (188 cm) Weight: 77.1 kg   Insurance Information HMO:     PPO:      PCP:      IPA:      80/20: yes     OTHER:  PRIMARY: Medicare A & B      Policy#: [id number]      Subscriber: patient CM Name:       Phone#:      Fax#:  Pre-Cert#:       Employer:  Benefits:  Phone #: verified eligibility via [employer] on 09/02/22     Name:  Eff. Date: Part A & B effective 03/24/93     Deduct: $1,632      Out of Pocket Max: NA      Life Max: NA CIR: 100% coverage      SNF: 100% coverage days 1-20, 80% coverage days 21-100 Outpatient: 80% coverage     Co-Pay: 20% Home Health: 100% coverage      Co-Pay:  DME: 80% coverage     Co-Pay: 20% Providers: pt's choice SECONDARY: BCBS/Federal Emp PPO      Policy#: [id number]     Phone#: [phone]   Financial Counselor:       Phone#:    The "Data Collection Information Summary" for patients in Inpatient Rehabilitation Facilities with attached "Privacy Act [place] Records" was provided and verbally reviewed with: Patient and Family   Emergency Contact Information Contact Information       Name Relation Home Work Mobile    [place] Son [phone]               Current Medical History  Patient Admitting Diagnosis: T3 and T6 fracture History of Present Illness: Pt is a 89 year old male with medical hx significant for: A-fib, prostate CA s/p seed implant, TIA, HLD, HTN, frequent falls. Pt presented to [facility] on 08/30/22 d/t fall resulting in pain to right hip, left shoulder and back of head.  CT thoracic spine revealed acute nondisplaced T3 and T6 fractures. Hip imaging revealed intramuscular hematoma in gluteus maximus and soft tissue contusion. CT head showed multiple small left posterior scalp hematoma.  No surgical intervention recommended for T3 and T6 fractures. Therapy evaluations  completed and CIR recommended d/t pt's deficits in functional mobility and ability to complete ADLs.   Patient's medical record from [facility] has been reviewed by the rehabilitation admission coordinator and physician.   Past Medical History      Past Medical History:  Diagnosis Date   Anxiety     Arthritis      "in the fingers" per pt   Atrial fibrillation ([place])     Bladder neck contracture     Dysrhythmia      A-FIB / HX BRADYCARDIA - FOLLOWED BY DR. [place] DUE TO [name]   Gross hematuria     History of gout     History of prostate cancer      S/P RADIOACTIVE SEED IMPLANTS   History of squamous cell carcinoma of skin      EXCISION LOWER LIP AND RIGHT EAR   History of TIA (transient ischemic attack)      probable tia no residual  09-09-2012   Hyperlipidemia     Hypertension     Macular degeneration      both  eyes   Urethral stricture     Urinary retention        Has the patient had major surgery during 100 days prior to admission? No   Family History   family history includes Cancer in his father and mother.   Current Medications   Current Facility-Administered Medications:    acetaminophen (TYLENOL) tablet 650 mg, 650 mg, Oral, Q6H PRN, Tu, Ching T, DO, 650 mg at 09/01/22 2015   apixaban (ELIQUIS) tablet 5 mg, 5 mg, Oral, BID, Rai, Ripudeep K, MD, 5 mg at 09/02/22 1458   hydrALAZINE (APRESOLINE) injection 5 mg, 5 mg, Intravenous, Q6H PRN, Tu, Ching T, DO   hydrALAZINE (APRESOLINE) tablet 10 mg, 10 mg, Oral, Q8H, Rai, Ripudeep K, MD, 10 mg at 09/02/22 1458   senna-docusate (Senokot-S) tablet 1 tablet, 1 tablet, Oral, QHS, Tu, Ching T, DO, 1 tablet at 09/01/22 2015   Patients Current Diet:  Diet Order                  Diet regular Room service appropriate? Yes; Fluid consistency: Thin  Diet effective now                         Precautions / Restrictions Precautions Precautions: Fall, Other (comment), Back Precaution  Booklet Issued: No Precaution Comments: poor vision (can see shadows; macular degeneration), watch BP (hx of orthostasis), hx of Afib with regular rate 40-60, R knee can buckle Spinal Brace: Thoracolumbosacral orthotic (Per MD, When OOB) Restrictions Weight Bearing Restrictions: No    Has the patient had 2 or more falls or a fall with injury in the past year? Yes   Prior Activity Level Limited Community (1-2x/wk): MD appointments   Prior Functional Level Self Care: Did the patient need help bathing, dressing, using the toilet or eating? Needed some help   Indoor Mobility: Did the patient need assistance with walking from room to room (with or without device)? Independent   Stairs: Did the patient need assistance with internal or external stairs (with or without device)? Unknown (pt avoids stairs)   Functional Cognition: Did the patient need help planning regular tasks such as shopping or remembering to take medications? Needed some help   Patient Information Are you of Hispanic, Latino/a,or Spanish origin?: A. No, not of Hispanic, Latino/a, or Spanish origin What is your race?: A. White Do you need or want an interpreter to communicate with a doctor or health care staff?: 0. No   Patient's Response To:  Health Literacy and Transportation Is the patient able to respond to health literacy and transportation needs?: Yes Health Literacy - How often do you need to have someone help you when you read instructions, pamphlets, or other written material from your doctor or pharmacy?: Often In the past 12 months, has lack of transportation kept you from medical appointments or from getting medications?: No In the past 12 months, has lack of transportation kept you from meetings, work, or from getting things needed for daily living?: No   Home Assistive Devices / [place]: Rollator (4 wheels)   Prior Device Use: Indicate devices/aids used by the patient prior to current  illness, exacerbation or injury? Walker   Current Functional Level Cognition   Overall Cognitive Status: Within Functional Limits for tasks assessed Orientation Level: Oriented to person General Comments: poor safety awareness in general, needed lots of cues for safety. BElieve this is likely baseline due to hx of many recent falls  Extremity Assessment (includes Sensation/Coordination)   Upper Extremity Assessment: Generalized weakness  Lower Extremity Assessment: Defer to PT evaluation     ADLs   Overall ADL's : Needs assistance/impaired Eating/Feeding: Set up, Bed level Grooming: Bed level, Minimal assistance Upper Body Bathing: Minimal assistance, Sitting Lower Body Bathing: Maximal assistance, +2 for physical assistance, +2 for safety/equipment Upper Body Dressing : Maximal assistance, Bed level Upper Body Dressing Details (indicate cue type and reason): to don brace Lower Body Dressing: Total assistance, Sit to/from stand Lower Body Dressing Details (indicate cue type and reason): to don socks Toilet Transfer: Moderate assistance, +2 for physical assistance, +2 for safety/equipment, Stand-pivot, Rolling walker (2 wheels), BSC/3in1 Toilet Transfer Details (indicate cue type and reason): simulated in room Toileting- Clothing Manipulation and Hygiene: Minimal assistance, Sitting/lateral lean Functional mobility during ADLs: Minimal assistance, +2 for physical assistance, +2 for safety/equipment     Mobility   Overal bed mobility: Needs Assistance Bed Mobility: Rolling, Sidelying to Sit Rolling: Mod assist, +2 for physical assistance Sidelying to sit: Max assist, +2 for physical assistance General bed mobility comments: heavy cues and step by step instructions for sequencing, totalA for TLSO donning     Transfers   Overall transfer level: Needs assistance Equipment used: Rolling walker (2 wheels) Transfers: Sit to/from Stand, Bed to chair/wheelchair/BSC Sit to Stand: Mod  assist, +2 physical assistance Bed to/from chair/wheelchair/BSC transfer type:: Stand pivot Stand pivot transfers: Mod assist, +2 physical assistance General transfer comment: ModAx2 for all transfer activity- tends to push RW too far ahead of him and R knee can buckle a bit     Ambulation / Gait / Stairs / Wheelchair Mobility   Ambulation/Gait General Gait Details: DNT today     Posture / Balance Balance Overall balance assessment: Needs assistance, History of Falls Sitting-balance support: Feet supported, Bilateral upper extremity supported Sitting balance-Leahy Scale: Fair Standing balance support: Bilateral upper extremity supported, During functional activity, Reliant on assistive device for balance Standing balance-Leahy Scale: Poor     Special needs/care consideration Skin Abrasion: toe/left; Ecchymosis: hip/right; Hematoma: buttocks/right; Scratch marks: leg/bilateral, Bladder incontinence, External urinary catheter    Previous Home Environment (from acute therapy documentation) Living Arrangements: Other (Comment) (lives at [facility]) Available Help at Discharge: Other (Comment) (staff at [facility]. Son also involved) Type of Home: Assisted living Home Layout: One level Home Access: Level entry Bathroom Shower/Tub: Multimedia programmer: Handicapped height Bathroom Accessibility: Yes How Accessible: Accessible via walker, Accessible via wheelchair [employer]: Yes Type of [place]: Home PT, [place] (if known): [facility] Additional Comments: [facility], has life alert button as well as pull cords in rooms.[facility] will not take pt if he cannot transfer and pivot, no hoyer lift or heavy duty DME. Memory comes and goes   Discharge Living Setting Plans for Discharge Living Setting: Other (Comment) ([facility]) Type of Home at Discharge: [place] Name at Discharge: [facility] Discharge Home  Layout: One level Discharge Home Access: Level entry Discharge Bathroom Shower/Tub: Walk-in shower Discharge Bathroom Toilet: Handicapped height Discharge Bathroom Accessibility: Yes How Accessible: Accessible via walker, Accessible via wheelchair Does the patient have any problems obtaining your medications?: No   Social/Family/Support Systems Anticipated Caregiver: staff at [facility] and son Xaviel Anticipated Ambulance person Information: Aziyah: [phone] Caregiver Availability: 24/7 Discharge Plan Discussed with Primary Caregiver: Yes Is Caregiver In Agreement with Plan?: Yes Does Caregiver/Family have Issues with Lodging/Transportation while Pt is in Rehab?: No   Goals  Patient/Family Goal for Rehab: PT/OT Supervision to Min A Expected length of stay:  Pt/Family Agrees to Admission and willing to participate: Yes Program Orientation Provided & Reviewed with Pt/Caregiver Including Roles  & Responsibilities: Yes   Decrease burden of Care through IP rehab admission: NA   Possible need for SNF placement upon discharge: Not anticipated   Patient Condition: I have reviewed medical records from [facility], spoken with CM, and patient and son. I met with patient at the bedside and discussed via phone for inpatient rehabilitation assessment.  Patient will benefit from ongoing PT and OT, can actively participate in 3 hours of therapy a day 5 days of the week, and can make measurable gains during the admission.  Patient will also benefit from the coordinated team approach during an Inpatient Acute Rehabilitation admission.  The patient will receive intensive therapy as well as Rehabilitation physician, nursing, social worker, and care management interventions.  Due to bladder management, safety, skin/wound care, disease management, medication administration, pain management, and patient education the patient requires 24 hour a day rehabilitation nursing.  The patient is currently  mod +2  with mobility and basic ADLs.  Discharge setting and therapy post discharge at assisted living facility is anticipated.  Patient has agreed to participate in the Acute Inpatient Rehabilitation Program and will admit today.   Preadmission Screen Completed By:  Bethel Born, 09/02/2022 4:53 PM ______________________________________________________________________   Discussed status with Dr. Ranell Patrick  on 09/06/22 at 57 and received approval for admission today.   Admission Coordinator:  Bethel Born, CCC-SLP, time 1030/Date 09/06/22    Assessment/Plan: Diagnosis: Closed T6 spinal fracture Does the need for close, 24 hr/day Medical supervision in concert with the patient's rehab needs make it unreasonable for this patient to be served in a less intensive setting? Yes Co-Morbidities requiring supervision/potential complications: afib, prostate cancer, TIA, HLD, HTN, frequent falls Due to bladder management, bowel management, safety, skin/wound care, disease management, medication administration, pain management, and patient education, does the patient require 24 hr/day rehab nursing? Yes Does the patient require coordinated care of a physician, rehab nurse, PT, OT, and SLP to address physical and functional deficits in the context of the above medical diagnosis(es)? Yes Addressing deficits in the following areas: balance, endurance, locomotion, strength, transferring, bowel/bladder control, bathing, dressing, feeding, grooming, toileting, cognition, and psychosocial support Can the patient actively participate in an intensive therapy program of at least 3 hrs of therapy 5 days a week? Yes The potential for patient to make measurable gains while on inpatient rehab is excellent Anticipated functional outcomes upon discharge from inpatient rehab: mod assist PT, mod assist OT, mod assist SLP Estimated rehab length of stay to reach the above functional goals is: 10-14  days Anticipated discharge destination: Home 10. Overall Rehab/Functional Prognosis: excellent     MD Signature: Leeroy Cha, MD

## 2022-09-06 NOTE — Progress Notes (Signed)
Occupational Therapy Treatment Patient Details Name: Justin Potter MRN: [id number] DOB: Nov 24, 1925 Today's Date: 09/06/2022   History of present illness 89 y/o male who was brought to the ED on 08/30/22 after a fall at home. Has been having recurrent falls for past 2 months. CT thoracic spine shows acute T3 and T6 fractures. R hip imaging with intramuscular hematoma in glute max. PMH Afib, gout, prostate CA, TIA, HLD, HTN, macular degeneration, TKA.   OT comments  Pt progressing towards established OT goals. Focus session on safety with functional mobility. Pt performing brace application with max A and STS transfers with mod A+2. Pt following commands to maintain precautions with increased time, but unable to self-implement precautions during session. Self feeding at end of session with set-up to drink from soup bowl. Pt's RN present to assist with sandwich and self feeding fruit after he finishes his soup. Continue to highly recommend [facility] for team approach to optimize safety and independence prior to return home.    Recommendations for follow up therapy are one component of a multi-disciplinary discharge planning process, led by the attending physician.  Recommendations may be updated based on patient status, additional functional criteria and insurance authorization.    Follow Up Recommendations  Acute inpatient rehab (3hours/day)     Assistance Recommended at Discharge Frequent or constant Supervision/Assistance  Patient can return home with the following  Two people to help with walking and/or transfers;Two people to help with bathing/dressing/bathroom;Assistance with cooking/housework;Assist for transportation;Help with stairs or ramp for entrance   Equipment Recommendations  Other (comment)    Recommendations for Other Services      Precautions / Restrictions Precautions Precautions: Fall;Back Precaution Booklet Issued: Yes (comment) Precaution Comments: reviewed back precautions  with pt, handout in room for his son, pt can't read handout due to visual deficit Required Braces or Orthoses: Spinal Brace Spinal Brace: Thoracolumbosacral orthotic;Applied in sitting position Restrictions Weight Bearing Restrictions: No       Mobility Bed Mobility Overal bed mobility: Needs Assistance Bed Mobility: Rolling, Sidelying to Sit Rolling: Max assist, +2 for safety/equipment Sidelying to sit: Max assist, +2 for physical assistance       General bed mobility comments: heavy cues and step by step instructions for sequencing, totalA for TLSO donning    Transfers Overall transfer level: Needs assistance Equipment used: Rolling walker (2 wheels) Transfers: Sit to/from Stand Sit to Stand: From elevated surface, Mod assist, +2 physical assistance           General transfer comment: from elevated bed<>Stedy and Stedy seat with up to +2 modA. Anticipate he will need increased assist from lower recliner chair height so Stedy left in room for nursing staff to assist him back. Transfer via Lift Equipment: Stedy   Balance Overall balance assessment: Needs assistance, History of Falls Sitting-balance support: Feet supported, Bilateral upper extremity supported Sitting balance-Leahy Scale: Fair     Standing balance support: Bilateral upper extremity supported, During functional activity, Reliant on assistive device for balance Standing balance-Leahy Scale: Poor Standing balance comment: +1-2 modA for static standing and +2 external assist with BUE support for dynamic standing tasks (marching in [place])                           ADL either performed or assessed with clinical judgement   ADL Overall ADL's : Needs assistance/impaired Eating/Feeding: Minimal assistance;Sitting Eating/Feeding Details (indicate cue type and reason): Constant min A to locate items due to  low vision at end of session             Upper Body Dressing : Maximal  assistance;Sitting Upper Body Dressing Details (indicate cue type and reason): to don brace; pt following commands to participate in donning brace with incresaed time Lower Body Dressing: Total assistance;Sit to/from stand Lower Body Dressing Details (indicate cue type and reason): to don compression hose and socks Toilet Transfer: Moderate assistance;+2 for physical assistance;+2 for safety/equipment;Stand-pivot;BSC/3in1 (stedy) Toilet Transfer Details (indicate cue type and reason): simulated to recliner         Functional mobility during ADLs: Moderate assistance;+2 for physical assistance (to maintain standing balance while practicing weight shifting in stedy)      Extremity/Trunk Assessment Upper Extremity Assessment Upper Extremity Assessment: Generalized weakness   Lower Extremity Assessment Lower Extremity Assessment: Defer to PT evaluation        Vision   Additional Comments: signfiicant vision loss due to macular degeneration; thus resulting in requiring cues for nearly all ADL   Perception     Praxis      Cognition Arousal/Alertness: Awake/alert Behavior During Therapy: WFL for tasks assessed/performed Overall Cognitive Status: No family/caregiver present to determine baseline cognitive functioning                                 General Comments: hx of dementia, pt agreeable to participate with encouragement and expressed surprise at symptom improvement after pt transferred to sitting and OOB. Pt anxious regarding mobility but cooperative. Incresaed time and effort for following commands.        Exercises Exercises: General Lower Extremity, Other exercises General Exercises - Lower Extremity Ankle Circles/Pumps: AROM, Both, 10 reps, Supine Long Arc Quad: AROM, Both, 10 reps, Seated (x5 reps ea x2 sets) Heel Slides: AAROM, Both, Supine (~3 reps ea (pain limiting)) Hip Flexion/Marching: AROM, Both, 10 reps, Standing, Strengthening (x5 reps x 2 sets  with seated break between sets; standing in Stedy frame) Other Exercises Other Exercises: STS x 2 trials and static standing x2 mins for BLE strengthening Other Exercises: chair push ups x5    Shoulder Instructions       General Comments Pt reports he is in significant pain when received in supine in his lower back but reports some improvement in pain with upright seated posture; HR WFL.    Pertinent Vitals/ Pain       Pain Assessment Pain Assessment: Faces Faces Pain Scale: Hurts even more Pain Location: L knee, L lower back/hip, generalized Pain Descriptors / Indicators: Tender, Grimacing, Guarding, Sore Pain Intervention(s): Limited activity within patient's tolerance, Monitored during session, Repositioned, Patient requesting pain meds-RN notified, Other (comment) (RN in room to administer medication at end of session; Geomat cushion in chair under pt's bottom for pressure relief)  Home Living                                          Prior Functioning/Environment              Frequency  Min 2X/week        Progress Toward Goals  OT Goals(current goals can now be found in the care plan section)  Progress towards OT goals: Progressing toward goals  Acute Rehab OT Goals Patient Stated Goal: eat OT Goal Formulation: With patient/family Time For Goal Achievement: 09/15/22  Potential to Achieve Goals: Good ADL Goals Pt Will Perform Upper Body Dressing: with min assist;sitting Pt Will Perform Lower Body Dressing: with min guard assist;sit to/from stand;with adaptive equipment Pt Will Transfer to Toilet: with min guard assist;stand pivot transfer;bedside commode Additional ADL Goal #1: Pt will perform bed mobility within spinal precautions with min guard A Additional ADL Goal #2: Pt will recall 3/3 spinal precautions and maintain during ADL without verbal cues  Plan Discharge plan remains appropriate    Co-evaluation      Reason for Co-Treatment:  For patient/therapist safety;To address functional/ADL transfers PT goals addressed during session: Mobility/safety with mobility;Balance;Strengthening/ROM OT goals addressed during session: ADL's and self-care;Proper use of Adaptive equipment and DME      AM-PAC OT "6 Clicks" Daily Activity     Outcome Measure   Help from another person eating meals?: A Little Help from another person taking care of personal grooming?: A Little Help from another person toileting, which includes using toliet, bedpan, or urinal?: A Lot Help from another person bathing (including washing, rinsing, drying)?: A Lot Help from another person to put on and taking off regular upper body clothing?: A Lot Help from another person to put on and taking off regular lower body clothing?: Total 6 Click Score: 13    End of Session Equipment Utilized During Treatment: Gait belt;Back brace;Other (comment) (stedy)  OT Visit Diagnosis: Unsteadiness on feet (R26.81);Repeated falls (R29.6);Muscle weakness (generalized) (M62.81);History of falling (Z91.81);Low vision, both eyes (H54.2);Other symptoms and signs involving cognitive function;Pain Pain - part of body:  (L buttock)   Activity Tolerance Patient tolerated treatment well   Patient Left in chair;with call bell/phone within reach;with chair alarm set;with nursing/sitter in room (review of how to use call bell prior to exiting room and RN in room on depature)   Nurse Communication Mobility status;Need for lift equipment        Time: [id number] (1204)-1230 OT Time Calculation (min): 26 min  Charges: OT General Charges $OT Visit: 1 Visit OT Treatments $Self Care/Home Management : 8-22 mins  Elder Cyphers, OTR/L [facility] Acute Rehabilitation Office: [phone]   Magnus Ivan 09/06/2022, 2:26 PM

## 2022-09-06 NOTE — H&P (Signed)
Physical Medicine and Rehabilitation Admission H&P   CC: Functional deficits secondary to fall resulting in closed T6 spinal fracture  HPI: Justin Potter is a 89 year old male who resides in [place] who was brought to the emergency department on 08/30/2022 after multiple falls and was found to have acute appearing compression fracture of the T6 vertebra and acute nondisplaced cortical fracture of the anterior aspect of the superior endplate of T3.  Nondisplaced spinal fracture.  He has a history of atrial fibrillation maintained on Eliquis.  CT scan of the head showed multiple small left posterior scalp hematomas and CT scan of the right hip revealed intramuscular hematoma of the gluteus maximus and soft tissue contusion.  He was treated conservatively with T LSO brace and analgesics.  Eliquis was temporarily held started on 2/10.  Blood work consistent with acute blood loss anemia and is stable and improving.  Here is requiring plus to mod assist for sit to stand from bed.  He is tolerating regular diet.The patient requires inpatient physical medicine and rehabilitation evaluations and treatment secondary to dysfunction due to thoracic spine fracture. Complains of left sided buttock pain.   Review of Systems  Constitutional:  Negative for chills and fever.  Respiratory:  Negative for shortness of breath.   Cardiovascular:  Negative for chest pain.  Gastrointestinal:  Positive for constipation. Negative for abdominal pain, nausea and vomiting.  Genitourinary:  Negative for dysuria and urgency.       Has external urinary catheter  Musculoskeletal:  Positive for falls.       Left proximal thigh pain with left knee extension while sitting  Neurological:  Positive for dizziness. Negative for headaches.       Mild dizziness upon rising   Past Medical History:  Diagnosis Date   Anxiety    Arthritis    "in the fingers" per pt   Atrial fibrillation (HCC)    Bladder neck contracture     Dysrhythmia    A-FIB / HX BRADYCARDIA - FOLLOWED BY DR. [place] DUE TO [name]   Gross hematuria    History of gout    History of prostate cancer    S/P RADIOACTIVE SEED IMPLANTS   History of squamous cell carcinoma of skin    EXCISION LOWER LIP AND RIGHT EAR   History of TIA (transient ischemic attack)    probable tia no residual  09-09-2012   Hyperlipidemia    Hypertension    Macular degeneration    both eyes   Urethral stricture    Urinary retention    Past Surgical History:  Procedure Laterality Date   APPENDECTOMY     CATARACT EXTRACTION W/ INTRAOCULAR LENS  IMPLANT, BILATERAL     CHOLECYSTECTOMY  1977   CYSTOSCOPY WITH URETHRAL DILATATION N/A 02/12/2013   Procedure: CYSTOSCOPY WITH BALLOON DILATATION OF URETHRAL STRICTURE AND BLADDER FULGERATION;  Surgeon: Bernestine Amass, MD;  Location: [facility];  Service: Urology;  Laterality: N/A;   ESOPHAGOGASTRODUODENOSCOPY (EGD) WITH PROPOFOL N/A 11/02/2020   Procedure: ESOPHAGOGASTRODUODENOSCOPY (EGD) WITH PROPOFOL;  Surgeon: Clarene Essex, MD;  Location: [facility];  Service: Endoscopy;  Laterality: N/A;   LAPAROSCOPIC APPENDECTOMY N/A 08/22/2013   Procedure: APPENDECTOMY LAPAROSCOPIC;  Surgeon: Pedro Earls, MD;  Location: [facility];  Service: General;  Laterality: N/A;   [place] N/A 11/02/2020   Procedure: SAVORY DILATION;  Surgeon: Clarene Essex, MD;  Location: [facility];  Service: Endoscopy;  Laterality:  N/A;   TONSILLECTOMY  as child   TOTAL KNEE ARTHROPLASTY Right 11-23-2004   Family History  Problem Relation Age of Onset   Cancer Mother    Cancer Father    Social History:  reports that he quit smoking about 43 years ago. His smoking use included cigarettes. He has a 52.50 pack-year smoking history. He has never used smokeless tobacco. He reports current alcohol use. He reports that he does not use drugs. Allergies: No Known Allergies Medications Prior to  Admission  Medication Sig Dispense Refill   acetaminophen (TYLENOL) 500 MG tablet Take 500 mg by mouth every 6 (six) hours as needed (for arthritis).     amoxicillin (AMOXIL) 500 MG capsule Take 2,000 mg by mouth See admin instructions. Take 2,000 mg by mouth one hour prior to dental appointments     ELIQUIS 5 MG TABS tablet Take 1 tablet (5 mg total) by mouth 2 (two) times daily. 60 tablet 0   Multiple Vitamins-Minerals (ICAPS AREDS 2 PO) Take 1 capsule by mouth in the morning.     Pediatric Multivit-Minerals (FLINTSTONES COMPLETE) CHEW Chew 1 tablet by mouth in the morning.     acetaminophen (TYLENOL) 325 MG tablet Take 2 tablets (650 mg total) by mouth every 6 (six) hours as needed for mild pain (or Fever >/= 101). (Patient not taking: Reported on 06/29/2022)       Home: Home Living Family/patient expects to be discharged to:: Assisted living ([facility] for rehab) Living Arrangements: Other (Comment) (lives at [facility]) Available Help at Discharge: Other (Comment) (staff at [employer] [facility]. Son also involved) Type of Home: Assisted living Home Access: Level entry Home Layout: One level Bathroom Shower/Tub: Multimedia programmer: Handicapped height Bathroom Accessibility: Yes Home Equipment: Rollator (4 wheels) Additional Comments: [facility], has life alert button as well as pull cords in rooms.[facility] will not take pt if he cannot transfer and pivot, no hoyer lift or heavy duty DME. Memory comes and goes   Functional History: Prior Function Prior Level of Function : Needs assist  Cognitive Assist : ADLs (cognitive) ADLs (Cognitive): Intermittent cues Physical Assist : ADLs (physical) ADLs (physical): Bathing, Dressing, IADLs (donning compression socks; gets 3 baths a week) Mobility Comments: was walking 50 yards with RW with staff with him before this most recent fall; has been getting quite a bit of PT/OT recently. Lots of falls in recent  history ADLs Comments: staff assisting with bathing and supervising with dressing   Functional Status:  Mobility: Bed Mobility Overal bed mobility: Needs Assistance Bed Mobility: Rolling, Sidelying to Sit Rolling: Mod assist, +2 for physical assistance Sidelying to sit: Max assist, +2 for physical assistance General bed mobility comments: heavy cues and step by step instructions for sequencing, totalA for TLSO donning Transfers Overall transfer level: Needs assistance Equipment used: Rolling walker (2 wheels) Transfers: Sit to/from Stand Sit to Stand: Mod assist, +2 physical assistance Bed to/from chair/wheelchair/BSC transfer type:: Step pivot Stand pivot transfers: Mod assist, +2 physical assistance Step pivot transfers: Mod assist, +2 physical assistance, +2 safety/equipment, From elevated surface General transfer comment: partial stand x 2 for pressure relief from recliner, pt is used to a lift chair, limited knee flexion Ambulation/Gait General Gait Details: pt fatigued after pivot to chair and falling asleep, defer longer gait trial due to drowsiness   ADL: ADL Overall ADL's : Needs assistance/impaired Eating/Feeding: Set up, Bed level Grooming: Brushing hair, Oral care, Sitting, Minimal assistance Upper Body Bathing: Minimal assistance, Sitting  Lower Body Bathing: Maximal assistance, +2 for physical assistance, +2 for safety/equipment Upper Body Dressing : Maximal assistance, Bed level Upper Body Dressing Details (indicate cue type and reason): to don brace Lower Body Dressing: Total assistance, Sit to/from stand Lower Body Dressing Details (indicate cue type and reason): to don socks Toilet Transfer: Moderate assistance, +2 for physical assistance, +2 for safety/equipment, Stand-pivot, Rolling walker (2 wheels), BSC/3in1 Toilet Transfer Details (indicate cue type and reason): simulated in room Toileting- Clothing Manipulation and Hygiene: Minimal assistance,  Sitting/lateral lean Functional mobility during ADLs: Minimal assistance, +2 for physical assistance, +2 for safety/equipment   Cognition: Cognition Overall Cognitive Status: No family/caregiver present to determine baseline cognitive functioning Orientation Level: Oriented to person, Disoriented to place, Disoriented to time, Disoriented to situation Cognition Arousal/Alertness: Awake/alert Behavior During Therapy: WFL for tasks assessed/performed Overall Cognitive Status: No family/caregiver present to determine baseline cognitive functioning General Comments: hx of dementia, decreased awareness of deficits    Physical Exam: There were no vitals taken for this visit. Physical Exam Constitutional:      General: He is not in acute distress. HENT:     Head: Normocephalic.     Comments: Small nontender hematoma left posterior scalp Cardiovascular:     Rate and Rhythm: Normal rate and regular rhythm.  Pulmonary:     Effort: Pulmonary effort is normal.     Breath sounds: Normal breath sounds.  Abdominal:     General: Bowel sounds are normal.     Palpations: Abdomen is soft.  Musculoskeletal:     Right lower leg: No edema.     Left lower leg: No edema. Diffuse 4/5 strength with the exceptio of left lower extremity which is extremely limited by pain Neurological:     General: No focal deficit present.     Mental Status: He is alert and oriented to person, place, and time. Good medical historian, but has word finding difficulties Psychiatric:        Mood and Affect: Mood normal.        Behavior: Behavior normal.     Results for orders placed or performed during the hospital encounter of 08/30/22 (from the past 48 hour(s))  CBC     Status: Abnormal   Collection Time: 09/05/22  6:25 AM  Result Value Ref Range   WBC 7.8 4.0 - 10.5 K/uL   RBC 3.03 (L) 4.22 - 5.81 MIL/uL   Hemoglobin 9.0 (L) 13.0 - 17.0 g/dL   HCT 28.2 (L) 39.0 - 52.0 %   MCV 93.1 80.0 - 100.0 fL   MCH 29.7 26.0  - 34.0 pg   MCHC 31.9 30.0 - 36.0 g/dL   RDW 14.5 11.5 - 15.5 %   Platelets 306 150 - 400 K/uL   nRBC 0.0 0.0 - 0.2 %    Comment: Performed at [facility], [place] [address], [place], [place] [address]  CBC     Status: Abnormal   Collection Time: 09/06/22  2:32 AM  Result Value Ref Range   WBC 9.3 4.0 - 10.5 K/uL   RBC 3.24 (L) 4.22 - 5.81 MIL/uL   Hemoglobin 9.8 (L) 13.0 - 17.0 g/dL   HCT 30.1 (L) 39.0 - 52.0 %   MCV 92.9 80.0 - 100.0 fL   MCH 30.2 26.0 - 34.0 pg   MCHC 32.6 30.0 - 36.0 g/dL   RDW 14.7 11.5 - 15.5 %   Platelets 336 150 - 400 K/uL   nRBC 0.0 0.0 - 0.2 %  Comment: Performed at [facility], [place] [address], [place], [place] [id number]  Basic metabolic panel     Status: Abnormal   Collection Time: 09/06/22  2:32 AM  Result Value Ref Range   Sodium 137 135 - 145 mmol/L   Potassium 4.5 3.5 - 5.1 mmol/L   Chloride 103 98 - 111 mmol/L   CO2 26 22 - 32 mmol/L   Glucose, Bld 104 (H) 70 - 99 mg/dL    Comment: Glucose reference range applies only to samples taken after fasting for at least 8 hours.   BUN 24 (H) 8 - 23 mg/dL   Creatinine, Ser 0.94 0.61 - 1.24 mg/dL   Calcium 8.5 (L) 8.9 - 10.3 mg/dL   GFR, Estimated >60 >60 mL/min    Comment: (NOTE) Calculated using the CKD-EPI Creatinine Equation (2021)    Anion gap 8 5 - 15    Comment: Performed at [facility] [address] [place], [place] [address]   No results found.    There were no vitals taken for this visit.  Medical Problem List and Plan: 1. Functional deficits secondary to thoracic spine fracture  -patient may shower  -ELOS/Goals: 10-14 days Min-ModA  Admit to CIR  2.  Antithrombotics: -DVT/anticoagulation:  Pharmaceutical: Eliquis  -antiplatelet therapy:   3. Left buttock pain: Continue Tylenol as needed; tramadol as needed.   4. Delirium: continue Seroquel 25 mg q HS  5. Neuropsych/cognition: This patient is capable of making decisions on his own behalf.  6.  Skin/Wound Care: Routine skin care checks   7. Fluids/Electrolytes/Nutrition: Routine Is and Os and follow-up chemistries  8: Hypertension: monitor TID and prn  -continue hydralazine 10 mg TID and monitor with ambulation  9: Mildly elevated BUN: encourage PO liquids; follow-up BMP  10: Anemia; Recent drop likely due to hematoma  -follow-up CBC  11: Constipation: will give Sorbitol tonight  I have personally performed a face to face diagnostic evaluation, including, but not limited to relevant history and physical exam findings, of this patient and developed relevant assessment and plan.  Additionally, I have reviewed and concur with the physician assistant's documentation above.  Risa Grill, PA-C  Izora Ribas, MD 09/06/2022

## 2022-09-06 NOTE — Progress Notes (Signed)
Inpatient Rehabilitation Admission Medication Review by a Pharmacist  A complete drug regimen review was completed for this patient to identify any potential clinically significant medication issues.  High Risk Drug Classes Is patient taking? Indication by Medication  Antipsychotic Yes Compazine - prn N/V Seroquel - agitation  Anticoagulant Yes Apixaban - Afib  Antibiotic No   Opioid Yes Tramadol - prn pain  Antiplatelet No   Hypoglycemics/insulin No   Vasoactive Medication No   Chemotherapy No   Other Yes Methocarbamol - prn spasms Trazodone - prn sleep     Type of Medication Issue Identified Description of Issue Recommendation(s)  Drug Interaction(s) (clinically significant)     Duplicate Therapy     Allergy     No Medication Administration End Date     Incorrect Dose     Additional Drug Therapy Needed     Significant med changes from prior encounter (inform family/care partners about these prior to discharge).    Other       Clinically significant medication issues were identified that warrant physician communication and completion of prescribed/recommended actions by midnight of the next day:  No  Pharmacist comments:  None  Time spent performing this drug regimen review (minutes):  20 minutes  Thank you Anette Guarneri, PharmD

## 2022-09-06 NOTE — Progress Notes (Signed)
  Inpatient Rehab Admissions Coordinator:     I have a CIR bed for this Pt. RN may call report to [phone].   Clemens Catholic, [place], [place] Admissions Coordinator  [phone] ([place]) [phone] (office)

## 2022-09-06 NOTE — Progress Notes (Signed)
Physical Therapy Treatment Patient Details Name: Justin Potter MRN: [id number] DOB: 17-Jun-1926 Today's Date: 09/06/2022   History of Present Illness 89 y/o male who was brought to the ED on 08/30/22 after a fall at home. Has been having recurrent falls for past 2 months. CT thoracic spine shows acute T3 and T6 fractures. R hip imaging with intramuscular hematoma in glute max. PMH Afib, gout, prostate CA, TIA, HLD, HTN, macular degeneration, TKA.    PT Comments    Pt received in recliner, PTA called back to room to assist RN with return transfer from chair>bed as NT busy and pt needing +2 lift assist and pt needing dense multimodal cues to maintain back precautions. Pt needing totalA to doff TLSO brace prior to log roll transfer from sit>supine. Pt needing increased assist, up to +2 maxA for sit<>stand with Stedy lift from lower surface height. Pt continues to benefit from PT services to progress toward functional mobility goals.    Recommendations for follow up therapy are one component of a multi-disciplinary discharge planning process, led by the attending physician.  Recommendations may be updated based on patient status, additional functional criteria and insurance authorization.  Follow Up Recommendations  Acute inpatient rehab (3hours/day)     Assistance Recommended at Discharge Frequent or constant Supervision/Assistance  Patient can return home with the following A lot of help with bathing/dressing/bathroom;Direct supervision/assist for medications management;Two people to help with bathing/dressing/bathroom;Two people to help with walking and/or transfers;Help with stairs or ramp for entrance;Assistance with feeding;Assistance with cooking/housework   Equipment Recommendations  Other (comment) (TBD)    Recommendations for Other Services       Precautions / Restrictions Precautions Precautions: Fall;Back Precaution Booklet Issued: Yes (comment) Precaution Comments: reviewed back  precautions with pt, handout in room for his son, pt can't read handout due to visual deficit Required Braces or Orthoses: Spinal Brace Spinal Brace: Thoracolumbosacral orthotic;Applied in sitting position Restrictions Weight Bearing Restrictions: No     Mobility  Bed Mobility Overal bed mobility: Needs Assistance Bed Mobility: Rolling, Sit to Sidelying Rolling: Mod assist       Sit to sidelying: Max assist, +2 for physical assistance General bed mobility comments: cues for back precs for comfort and hand over hand assist for safe technique    Transfers Overall transfer level: Needs assistance Equipment used: Ambulation equipment used Transfers: Sit to/from Stand, Bed to chair/wheelchair/BSC Sit to Stand: Max assist, +2 physical assistance           General transfer comment: use of momentum and chair pad to assist pt to lift hips up off chair surface, pt needs increased time to extend trunk and for posterior hip/bottom support until he is able to fully extend hips for placement of Stedy pads; totalA with Stedy for pivot back to bed surface. Transfer via Lift Equipment: Stedy     Balance Overall balance assessment: Needs assistance, History of Falls Sitting-balance support: Feet supported, Bilateral upper extremity supported Sitting balance-Leahy Scale: Fair     Standing balance support: Bilateral upper extremity supported, Reliant on assistive device for balance Standing balance-Leahy Scale: Zero Standing balance comment: +2 maxA to maintain standing due to fatigue/pain after sitting in chair                            Cognition Arousal/Alertness: Awake/alert Behavior During Therapy: WFL for tasks assessed/performed Overall Cognitive Status: No family/caregiver present to determine baseline cognitive functioning  General Comments: Hx of dementia, increased time and effort for following commands. Emphasis on  reorientation to time/situation and repositioning for comfort.        Exercises      General Comments General comments (skin integrity, edema, etc.): HR 60's bpm      Pertinent Vitals/Pain Pain Assessment Pain Assessment: PAINAD Breathing: normal Negative Vocalization: occasional moan/groan, low speech, negative/disapproving quality Facial Expression: facial grimacing Body Language: relaxed Consolability: distracted or reassured by voice/touch PAINAD Score: 4 Pain Location: L knee/quad, L lower back/hip, generalized Pain Descriptors / Indicators: Tender, Grimacing, Guarding, Sore Pain Intervention(s): Monitored during session, Limited activity within patient's tolerance, Premedicated before session, Repositioned     PT Goals (current goals can now be found in the care plan section) Acute Rehab PT Goals Patient Stated Goal: To get stronger at rehab and less pain PT Goal Formulation: With patient/family Time For Goal Achievement: 09/15/22 Progress towards PT goals: Progressing toward goals    Frequency    Min 3X/week      PT Plan Current plan remains appropriate       AM-PAC PT "6 Clicks" Mobility   Outcome Measure  Help needed turning from your back to your side while in a flat bed without using bedrails?: A Lot Help needed moving from lying on your back to sitting on the side of a flat bed without using bedrails?: A Lot Help needed moving to and from a bed to a chair (including a wheelchair)?: A Lot Help needed standing up from a chair using your arms (e.g., wheelchair or bedside chair)?: Total Help needed to walk in hospital room?: Total Help needed climbing 3-5 steps with a railing? : Total 6 Click Score: 9    End of Session Equipment Utilized During Treatment: Gait belt;Back brace Activity Tolerance: Patient limited by pain Patient left: with call bell/phone within reach;Other (comment);in bed;with bed alarm set (HOB elevated >30* for reduced risk of  aspiration given pt age and cognitive deficit) Nurse Communication: Mobility status;Need for lift equipment PT Visit Diagnosis: Unsteadiness on feet (R26.81);Difficulty in walking, not elsewhere classified (R26.2);Repeated falls (R29.6);Muscle weakness (generalized) (M62.81)     Time: [id number] PT Time Calculation (min) (ACUTE ONLY): 15 min  Charges:  $Therapeutic Activity: 8-22 mins                     Rayah Fines P., PTA Acute Rehabilitation Services Secure Chat Preferred 9a-5:30pm Office: [place] 09/06/2022, 5:43 PM

## 2022-09-07 DIAGNOSIS — S22000A Wedge compression fracture of unspecified thoracic vertebra, initial encounter for closed fracture: Secondary | ICD-10-CM | POA: Diagnosis not present

## 2022-09-07 LAB — CBC WITH DIFFERENTIAL/PLATELET
Abs Immature Granulocytes: 0.05 10*3/uL (ref 0.00–0.07)
Basophils Absolute: 0.1 10*3/uL (ref 0.0–0.1)
Basophils Relative: 1 %
Eosinophils Absolute: 0.2 10*3/uL (ref 0.0–0.5)
Eosinophils Relative: 2 %
HCT: 32.4 % — ABNORMAL LOW (ref 39.0–52.0)
Hemoglobin: 10.4 g/dL — ABNORMAL LOW (ref 13.0–17.0)
Immature Granulocytes: 1 %
Lymphocytes Relative: 21 %
Lymphs Abs: 2.2 10*3/uL (ref 0.7–4.0)
MCH: 29.6 pg (ref 26.0–34.0)
MCHC: 32.1 g/dL (ref 30.0–36.0)
MCV: 92.3 fL (ref 80.0–100.0)
Monocytes Absolute: 1.2 10*3/uL — ABNORMAL HIGH (ref 0.1–1.0)
Monocytes Relative: 11 %
Neutro Abs: 6.7 10*3/uL (ref 1.7–7.7)
Neutrophils Relative %: 64 %
Platelets: 393 10*3/uL (ref 150–400)
RBC: 3.51 MIL/uL — ABNORMAL LOW (ref 4.22–5.81)
RDW: 14.7 % (ref 11.5–15.5)
WBC: 10.4 10*3/uL (ref 4.0–10.5)
nRBC: 0 % (ref 0.0–0.2)

## 2022-09-07 LAB — COMPREHENSIVE METABOLIC PANEL
ALT: 19 U/L (ref 0–44)
AST: 29 U/L (ref 15–41)
Albumin: 2.7 g/dL — ABNORMAL LOW (ref 3.5–5.0)
Alkaline Phosphatase: 76 U/L (ref 38–126)
Anion gap: 8 (ref 5–15)
BUN: 23 mg/dL (ref 8–23)
CO2: 25 mmol/L (ref 22–32)
Calcium: 8.5 mg/dL — ABNORMAL LOW (ref 8.9–10.3)
Chloride: 104 mmol/L (ref 98–111)
Creatinine, Ser: 0.89 mg/dL (ref 0.61–1.24)
GFR, Estimated: >60 mL/min (ref >60)
Glucose, Bld: 111 mg/dL — ABNORMAL HIGH (ref 70–99)
Potassium: 4 mmol/L (ref 3.5–5.1)
Sodium: 137 mmol/L (ref 135–145)
Total Bilirubin: 0.7 mg/dL (ref 0.3–1.2)
Total Protein: 5.6 g/dL — ABNORMAL LOW (ref 6.5–8.1)

## 2022-09-07 MED ORDER — LIDOCAINE 5 % EX PTCH
2.0000 | MEDICATED_PATCH | CUTANEOUS | Status: DC
  Administered 2022-09-07 – 2022-09-17 (×4): 2 via TRANSDERMAL
  Filled 2022-09-07 (×7): qty 2

## 2022-09-07 MED ORDER — TRAMADOL HCL 50 MG PO TABS
50.0000 mg | ORAL_TABLET | Freq: Four times a day (QID) | ORAL | Status: DC
  Administered 2022-09-07 – 2022-09-12 (×15): 50 mg via ORAL
  Filled 2022-09-07 (×16): qty 1

## 2022-09-07 MED ORDER — SORBITOL 70 % SOLN
30.0000 mL | Freq: Once | Status: DC
  Filled 2022-09-07: qty 30

## 2022-09-07 NOTE — Progress Notes (Addendum)
PROGRESS NOTE   Subjective/Complaints:    ROS:  Pt denies SOB, abd pain, CP, N/V/ (+)C/D, and vision changes  Objective:   No results found. Recent Labs    09/06/22 0232 09/07/22 0806  WBC 9.3 10.4  HGB 9.8* 10.4*  HCT 30.1* 32.4*  PLT 336 393   Recent Labs    09/06/22 0232 09/07/22 0806  NA 137 137  K 4.5 4.0  CL 103 104  CO2 26 25  GLUCOSE 104* 111*  BUN 24* 23  CREATININE 0.94 0.89  CALCIUM 8.5* 8.5*    Intake/Output Summary (Last 24 hours) at 09/07/2022 1013 Last data filed at 09/07/2022 0919 Gross per 24 hour  Intake 456 ml  Output 700 ml  Net -244 ml        Physical Exam: Vital Signs Blood pressure (!) 160/75, pulse 69, temperature 98 F (36.7 C), temperature source Oral, resp. rate 16, height 6' 2"$  (1.88 m), weight 79.6 kg, SpO2 98 %.     General: awake, alert, appropriate, NAD HENT: conjugate gaze; oropharynx moist CV: regular rate; no JVD Pulmonary: CTA B/L; no W/R/R- good air movement GI: soft, NT, ND, (+)BS Psychiatric: appropriate Neurological: Ox3 Musculoskeletal:     Right lower leg: No edema.     Left lower leg: No edema. Diffuse 4/5 strength with the exceptio of left lower extremity which is extremely limited by pain Assessment/Plan: 1. Functional deficits which require 3+ hours per day of interdisciplinary therapy in a comprehensive inpatient rehab setting. Physiatrist is providing close team supervision and 24 hour management of active medical problems listed below. Physiatrist and rehab team continue to assess barriers to discharge/monitor patient progress toward functional and medical goals  Care Tool:  Bathing              Bathing assist       Upper Body Dressing/Undressing Upper body dressing        Upper body assist      Lower Body Dressing/Undressing Lower body dressing            Lower body assist       Toileting Toileting    Toileting  assist       Transfers Chair/bed transfer  Transfers assist           Locomotion Ambulation   Ambulation assist              Walk 10 feet activity   Assist           Walk 50 feet activity   Assist           Walk 150 feet activity   Assist           Walk 10 feet on uneven surface  activity   Assist           Wheelchair     Assist               Wheelchair 50 feet with 2 turns activity    Assist            Wheelchair 150 feet activity     Assist  Blood pressure (!) 160/75, pulse 69, temperature 98 F (36.7 C), temperature source Oral, resp. rate 16, height 6' 2"$  (1.88 m), weight 79.6 kg, SpO2 98 %.   Medical Problem List and Plan: 1. Functional deficits secondary to thoracic spine compression fracture             -patient may shower             -ELOS/Goals: 10-14 days Min-ModA             First day of evaluation- con't CIR PT and OT 2.  Antithrombotics: -DVT/anticoagulation:  Pharmaceutical: Eliquis for Afib             -antiplatelet therapy:    3. Left buttock pain: Continue Tylenol as needed; tramadol as needed.    2/15- will schedule Tramadol 50 mg q6 hours and cont' tylenol prn.  4. Delirium: continue Seroquel 25 mg q HS   2/15- a little confused in AM per son- will monitor and ocn't Seroquel for now 5. Neuropsych/cognition: This patient is capable of making decisions on his own behalf.   6. Skin/Wound Care: Routine skin care checks   7. Fluids/Electrolytes/Nutrition: Routine Is and Os and follow-up chemistries   8: Hypertension: monitor TID and prn             -continue hydralazine 10 mg TID and monitor with ambulation   2/15- BP a little elevated- but hurting more- will monitor for trend before changing meds 9: Mildly elevated BUN: encourage PO liquids; follow-up BMP   2/15- Cr stable- BUN 23- slightly dry- will encourage fluids.  10: Anemia; Recent drop likely due to hematoma              2/15- New Hb 10.4- doing better  11: Constipation: will give Sorbitol tonight   2/15- smear last night- but no results so far-  Will give Sorbitol 30cc again after therapy today.  12. Afib- con't Eliquis for this- is rate controlled     I spent a total of 54   minutes on total care today- >50% coordination of care- due to  D/w son on phone; d/w nursing about confusion I saw this AM and decision making on pain meds   LOS: 1 days A FACE TO FACE EVALUATION WAS PERFORMED  Chrisie Jankovich 09/07/2022, 10:13 AM

## 2022-09-07 NOTE — Progress Notes (Signed)
Inpatient Rehabilitation  Patient information reviewed and entered into eRehab system by Satoria Dunlop Kevyn Wengert, OTR/L, Rehab Quality Coordinator.   Information including medical coding, functional ability and quality indicators will be reviewed and updated through discharge.   

## 2022-09-07 NOTE — Evaluation (Signed)
Physical Therapy Assessment and Plan  Patient Details  Name: Justin Potter MRN: [id number] Date of Birth: December 27, 1925  PT Diagnosis: Contracture of joint: knees, Coordination disorder, Difficulty walking, Impaired cognition, and Muscle weakness Rehab Potential: Good ELOS: 2-3 weeks   Today's Date: 09/07/2022 PT Individual Time: 1300-1415 PT Individual Time Calculation (min): 75 min    Hospital Problem: Principal Problem:   Compression fracture of body of thoracic vertebra ([place])   Past Medical History:  Past Medical History:  Diagnosis Date   Anxiety    Arthritis    "in the fingers" per pt   Atrial fibrillation ([place])    Bladder neck contracture    Dysrhythmia    A-FIB / HX BRADYCARDIA - FOLLOWED BY DR. [place] DUE TO [name]   Gross hematuria    History of gout    History of prostate cancer    S/P RADIOACTIVE SEED IMPLANTS   History of squamous cell carcinoma of skin    EXCISION LOWER LIP AND RIGHT EAR   History of TIA (transient ischemic attack)    probable tia no residual  09-09-2012   Hyperlipidemia    Hypertension    Macular degeneration    both eyes   Urethral stricture    Urinary retention    Past Surgical History:  Past Surgical History:  Procedure Laterality Date   APPENDECTOMY     CATARACT EXTRACTION W/ INTRAOCULAR LENS  IMPLANT, BILATERAL     CHOLECYSTECTOMY  1977   CYSTOSCOPY WITH URETHRAL DILATATION N/A 02/12/2013   Procedure: CYSTOSCOPY WITH BALLOON DILATATION OF URETHRAL STRICTURE AND BLADDER FULGERATION;  Surgeon: Bernestine Amass, MD;  [facility];  Service: Urology;  Laterality: N/A;   ESOPHAGOGASTRODUODENOSCOPY (EGD) WITH PROPOFOL N/A 11/02/2020   Procedure: ESOPHAGOGASTRODUODENOSCOPY (EGD) WITH PROPOFOL;  Surgeon: Clarene Essex, MD;  [facility];  Service: Endoscopy;  Laterality: N/A;   LAPAROSCOPIC APPENDECTOMY N/A 08/22/2013   Procedure: APPENDECTOMY LAPAROSCOPIC;  Surgeon: Pedro Earls, MD;  [facility];  Service: General;  Laterality: N/A;   [place] N/A 11/02/2020   Procedure: SAVORY DILATION;  Surgeon: Clarene Essex, MD;  [facility];  Service: Endoscopy;  Laterality: N/A;   TONSILLECTOMY  as child   TOTAL KNEE ARTHROPLASTY Right 11-23-2004    Assessment & Plan Clinical Impression: Justin Potter is a 89 year old male who resides in [place] who was brought to the emergency department on 08/30/2022 after multiple falls and was found to have acute appearing compression fracture of the T6 vertebra and acute nondisplaced cortical fracture of the anterior aspect of the superior endplate of T3.  Nondisplaced spinal fracture.  He has a history of atrial fibrillation maintained on Eliquis.  CT scan of the head showed multiple small left posterior scalp hematomas and CT scan of the right hip revealed intramuscular hematoma of the gluteus maximus and soft tissue contusion.  He was treated conservatively with T LSO brace and analgesics.  Eliquis was temporarily held started on 2/10.  Blood work consistent with acute blood loss anemia and is stable and improving.  Here is requiring plus to mod assist for sit to stand from bed.  He is tolerating regular diet.The patient requires inpatient physical medicine and rehabilitation evaluations and treatment secondary to dysfunction due to thoracic spine fracture. Complains of left sided buttock pain.   Patient transferred to CIR on 09/06/2022 .   Patient currently requires max with mobility secondary to  muscle weakness and muscle joint tightness, decreased cardiorespiratoy endurance, impaired timing and sequencing and decreased coordination, and decreased sitting balance, decreased standing balance, and decreased balance strategies.  Prior to hospitalization, patient was modified independent  with mobility and lived with Alone in a Assisted living home.  Home access is  Level entry.  Patient will benefit  from skilled PT intervention to maximize safe functional mobility, minimize fall risk, and decrease caregiver burden for planned discharge home with 24 hour supervision.  Anticipate patient will benefit from follow up [place] at discharge.  PT - End of Session Activity Tolerance: Tolerates 10 - 20 min activity with multiple rests Endurance Deficit: Yes Endurance Deficit Description: Pt fatigues quickly PT Assessment Rehab Potential (ACUTE/IP ONLY): Good PT Barriers to Discharge: Home environment access/layout;Decreased caregiver support;Lack of/limited family support PT Patient demonstrates impairments in the following area(s): Balance;Skin Integrity;Edema;Endurance;Motor;Pain;Safety;Sensory;Perception PT Transfers Functional Problem(s): Bed Mobility;Bed to Chair PT Locomotion Functional Problem(s): Ambulation;Wheelchair Mobility PT Plan PT Intensity: Minimum of 1-2 x/day ,45 to 90 minutes PT Frequency: 5 out of 7 days PT Duration Estimated Length of Stay: 2-3 weeks PT Treatment/Interventions: Ambulation/gait training;Discharge planning;Functional mobility training;Psychosocial support;Therapeutic Activities;Balance/vestibular training;Disease management/prevention;Neuromuscular re-education;Skin care/wound management;Therapeutic Exercise;Wheelchair propulsion/positioning;Cognitive remediation/compensation;DME/adaptive equipment instruction;Pain management;Splinting/orthotics;UE/LE Strength taining/ROM;UE/LE Chief Strategy Officer education;Functional electrical stimulation;Community reintegration;Visual/perceptual remediation/compensation PT Transfers Anticipated Outcome(s): min A with LRAD PT Locomotion Anticipated Outcome(s): supervision w/c PT Recommendation Recommendations for Other Services: Speech consult Follow Up Recommendations: Other (comment) Patient destination: Assisted Living Equipment Recommended: Standard walker   PT  Evaluation Precautions/Restrictions Precautions Precautions: Fall;Back Precaution Comments: reviewed back precautions with pt, Required Braces or Orthoses: Spinal Brace Spinal Brace: Thoracolumbosacral orthotic;Applied in sitting position General   Vital SignsTherapy Vitals Temp: (!) 97.5 F (36.4 C) Temp Source: Oral Pulse Rate: 63 Resp: 18 BP: (!) 107/59 Patient Position (if appropriate): Sitting Oxygen Therapy SpO2: 100 % O2 Device: Room Air Pain   Pain Interference Pain Interference Pain Effect on Sleep: 8. Unable to answer (Pt  unable to recall) Pain Interference with Therapy Activities: 8. Unable to answer Pain Interference with Day-to-Day Activities: 8. Unable to answer Home Living/Prior Functioning Home Living Available Help at Discharge: Other (Comment) ([facility]) Type of Home: Assisted living Home Access: Level entry Home Layout: One level Additional Comments: [facility], has life alert button as well as pull cords in rooms.[facility] will not take pt if he cannot transfer and pivot, no hoyer lift or heavy duty DME. Memory comes and goes  Lives With: Alone Prior Function Level of Independence: Requires assistive device for independence (has various  DME, but  mostly RW PTA)  Able to Take Stairs?: Yes (with handrails) Driving: No Vocation: Retired Radiographer, therapeutic - History Ability to See in Adequate Light: 3 Highly impaired Perception Perception: Within Functional Limits Praxis Praxis: Intact  Cognition Overall Cognitive Status: No family/caregiver present to determine baseline cognitive functioning Orientation Level: Oriented X4 Year: 2024 Memory: Impaired Awareness: Appears intact Problem Solving: Appears intact Safety/Judgment: Appears intact Sensation Sensation Light Touch: Appears Intact Coordination Gross Motor Movements are Fluid and Coordinated: No Motor  Motor Motor: Other (comment) Motor - Skilled  Clinical Observations: generalized weakness   Trunk/Postural Assessment  Cervical Assessment Cervical Assessment: Within Functional Limits Thoracic Assessment Thoracic Assessment: Exceptions to WFL (TLSO) Lumbar Assessment Lumbar Assessment: Exceptions to [facility] Postural Control Postural Control: Deficits on evaluation  Balance Balance Balance Assessed: Yes Static Sitting Balance Static Sitting - Balance Support: Feet supported Static Sitting - Level of Assistance: 4: Min  assist Dynamic Sitting Balance Dynamic Sitting - Balance Support: Feet supported;During functional activity Dynamic Sitting - Level of Assistance: 3: Mod assist Extremity Assessment      RLE Assessment RLE Assessment: Exceptions to [facility] Passive Range of Motion (PROM) Comments: Knee felxion and extension limited General Strength Comments: Grossly 3-/5 LLE Assessment LLE Assessment: Exceptions to [facility] Passive Range of Motion (PROM) Comments: Knee flexion and extension limited General Strength Comments: 3-/5 grossly  Care Tool Care Tool Bed Mobility Roll left and right activity   Roll left and right assist level: Maximal Assistance - Patient 25 - 49%    Sit to lying activity   Sit to lying assist level: Maximal Assistance - Patient 25 - 49%    Lying to sitting on side of bed activity   Lying to sitting on side of bed assist level: the ability to move from lying on the back to sitting on the side of the bed with no back support.: Total Assistance - Patient < 25%     Care Tool Transfers Sit to stand transfer   Sit to stand assist level: 2 Helpers    Chair/bed transfer         Toilet transfer   Assist Level: 2 Customer service manager transfer          Care Tool Locomotion Ambulation          Walk 10 feet activity         Walk 50 feet with 2 turns activity        Walk 150 feet activity        Walk 10 feet on uneven surfaces activity        Stairs          Walk up/down 1 step activity         Walk up/down 4 steps activity        Walk up/down 12 steps activity        Pick up small objects from floor        Wheelchair            Wheel 50 feet with 2 turns activity      Wheel 150 feet activity        Refer to Care Plan for Long Term Goals  SHORT TERM GOAL WEEK 1 PT Short Term Goal 1 (Week 1): Pt will stand with +1 assist PT Short Term Goal 2 (Week 1): Pt will perform squat pivot transfer with mod A PT Short Term Goal 3 (Week 1): Pt will initiate standing transfer training  Recommendations for other services: None   Skilled Therapeutic Intervention Evaluation completed (see details above) with patient education regarding purpose of PT evaluation, PT POC and goals, therapy schedule, weekly team meetings, and other CIR information including safety plan and fall risk safety. MMT and sensory testing performed in sitting. Pt performed the below functional mobility tasks with the specified levels of skilled cuing and assistance. Pt with occasionally confused speech (referring to hospital room as "apartment," etc) but oriented when asked specific A and O questions. Pt does state "sometimes I don't know those things." But reports uses strategies like a watch to assist. Pt requires +2 to stand to stedy, but noted that this might be confidence related, as pt able to stand easily with 2 people but not able to shift forward enough to even attempt with one person. Pt performed max a squat pivot to mat table with extended time for instruction of  head hips relationship, pt then able to perform with assist and max cueing. Pt returned to room and remained in w/c, was left with all needs in reach and alarm active.   Mobility Transfers Transfers: Sit to [employer] Transfers Sit to Stand: 2 Helpers Squat Pivot Transfers: Maximal Assistance - Patient 25-49% Transfer via Lift Equipment: Probation officer Ambulation: No Gait Gait: No Stairs / Additional Locomotion Stairs:  No Architect: Yes Wheelchair Assistance: Chartered loss adjuster: Both upper extremities Wheelchair Parts Management: Needs assistance Distance: 120 ft   Discharge Criteria: Patient will be discharged from PT if patient refuses treatment 3 consecutive times without medical reason, if treatment goals not met, if there is a change in medical status, if patient makes no progress towards goals or if patient is discharged from hospital.  The above assessment, treatment plan, treatment alternatives and goals were discussed and mutually agreed upon: by patient  Mickel Fuchs 09/07/2022, 3:53 PM

## 2022-09-07 NOTE — Progress Notes (Signed)
Inpatient Rehabilitation Care Coordinator Assessment and Plan Patient Details  Name: Justin Potter MRN: [id number] Date of Birth: 1926/06/21  Today's Date: 09/07/2022  Hospital Problems: Principal Problem:   Compression fracture of body of thoracic vertebra [facility])  Past Medical History:  Past Medical History:  Diagnosis Date   Anxiety    Arthritis    "in the fingers" per pt   Atrial fibrillation ([place])    Bladder neck contracture    Dysrhythmia    A-FIB / HX BRADYCARDIA - FOLLOWED BY DR. [place] DUE TO [name]   Gross hematuria    History of gout    History of prostate cancer    S/P RADIOACTIVE SEED IMPLANTS   History of squamous cell carcinoma of skin    EXCISION LOWER LIP AND RIGHT EAR   History of TIA (transient ischemic attack)    probable tia no residual  09-09-2012   Hyperlipidemia    Hypertension    Macular degeneration    both eyes   Urethral stricture    Urinary retention    Past Surgical History:  Past Surgical History:  Procedure Laterality Date   APPENDECTOMY     CATARACT EXTRACTION W/ INTRAOCULAR LENS  IMPLANT, BILATERAL     CHOLECYSTECTOMY  1977   CYSTOSCOPY WITH URETHRAL DILATATION N/A 02/12/2013   Procedure: CYSTOSCOPY WITH BALLOON DILATATION OF URETHRAL STRICTURE AND BLADDER FULGERATION;  Surgeon: Bernestine Amass, MD;  [facility];  Service: Urology;  Laterality: N/A;   ESOPHAGOGASTRODUODENOSCOPY (EGD) WITH PROPOFOL N/A 11/02/2020   Procedure: ESOPHAGOGASTRODUODENOSCOPY (EGD) WITH PROPOFOL;  Surgeon: Clarene Essex, MD;  [facility];  Service: Endoscopy;  Laterality: N/A;   LAPAROSCOPIC APPENDECTOMY N/A 08/22/2013   Procedure: APPENDECTOMY LAPAROSCOPIC;  Surgeon: Pedro Earls, MD;  [facility];  Service: General;  Laterality: N/A;   [place] N/A 11/02/2020   Procedure: SAVORY DILATION;  Surgeon: Clarene Essex, MD;  [facility];  Service: Endoscopy;   Laterality: N/A;   TONSILLECTOMY  as child   TOTAL KNEE ARTHROPLASTY Right 11-23-2004   Social History:  reports that he quit smoking about 43 years ago. His smoking use included cigarettes. He has a 52.50 pack-year smoking history. He has never used smokeless tobacco. He reports current alcohol use. He reports that he does not use drugs.  Family / Support Systems Marital Status: Widow/Widower How Long?: unknown. unable to get accurate date Spouse/Significant Other: Widowed Children: 5 children- Eain Lanser, Diona Foley, and Tarry Kos. Other Supports: [facility] Anticipated Caregiver: [facility] Ability/Limitations of Caregiver: Per EMR, pt must be able to transfer and pivot in order to return to ALF; no heavy liftinh or heavy duty DME Caregiver Availability: 24/7 Family Dynamics: Pt lives at [facility].  Social History Preferred language: English Religion: Catholic Cultural Background: Pt reports he did not some independent work for [employer] Unsure on what jobs he has held. Education: college Field seismologist - How often do you need to have someone help you when you read instructions, pamphlets, or other written material from your doctor or pharmacy?: Never Writes: Yes Employment Status: Retired Date Retired/Disabled/Unemployed: 22 Age Retired: 57 Public relations account executive Issues: Denies Guardian/Conservator: N/A   Abuse/Neglect Abuse/Neglect Assessment Can Be Completed: Yes Physical Abuse: Denies Verbal Abuse: Denies Sexual Abuse: Denies Exploitation of patient/patient's resources: Denies Self-Neglect: Denies  Patient response to: Social Isolation - How often do you feel lonely or isolated from  those around you?: Never  Emotional Status Pt's affect, behavior and adjustment status: Pt in good spirits at time of visit. Slow to respond to answers, and some confusion at times. Recent Psychosocial Issues: Denies Psychiatric  History: Denies Substance Abuse History: Pt reports he quit tobacco product use in 1975; and etoh 1 yr ago. Denies rec drug use.  Patient / Family Perceptions, Expectations & Goals Pt/Family understanding of illness & functional limitations: Per EMR, family has an understanding of pt care needs Premorbid pt/family roles/activities: assistance required with ADLs/IADLs and cognition Anticipated changes in roles/activities/participation: continued assisstance as noted above. Pt/family expectations/goals: Pt woud like ot work on physically becoming more comfortable, and be able to stand up and be mobile.  Community Resources [employer]s: None Premorbid Home Care/DME Agencies: None Transportation available at discharge: TBD Is the patient able to respond to transportation needs?: Yes In the past 12 months, has lack of transportation kept you from medical appointments or from getting medications?: No In the past 12 months, has lack of transportation kept you from meetings, work, or from getting things needed for daily living?: No Resource referrals recommended: Neuropsychology  Discharge Planning Living Arrangements: Other (Comment), Group Home (ALF) Support Systems: Home care staff Type of Residence: [place] Name: [facility] Insurance Resources: Chartered certified accountant Resources: Social Security Financial Screen Referred: No Living Expenses: Other (Comment) Money Management: Family Does the patient have any problems obtaining your medications?: No Home Management: Pt lives in [place] and gets assistance with meals and housecleaning. 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 HCPOA- Briceton Schlichte (son). PT aware SW will continue to make contact with his son.   SW  updated [place]/liaison with [employer] for [facility] on pt admission, and SW will follow-up with updates.   Jarris Kortz A Branch Pacitti 09/07/2022, 11:12 AM

## 2022-09-07 NOTE — Progress Notes (Signed)
Patient ID: MARTIN CHAUHAN, male   DOB: 03-08-1926, 89 y.o.   MRN: [id number]  SW left message for pt son Jaysion introducing self, explaining role, and would like to discuss discharge plan for patient. SW waiting on follow-up.   Loralee Pacas, MSW, [place] Office: [phone] Cell: [phone] Fax: [phone]

## 2022-09-07 NOTE — Progress Notes (Signed)
Occupational Therapy Session Note  Patient Details  Name: Justin Potter MRN: [id number] Date of Birth: 1925/10/25  Today's Date: 09/07/2022 OT Individual Time: [id number] OT Individual Time Calculation (min): 70 min    Short Term Goals: Week 1:  OT Short Term Goal 1 (Week 1): Pt will require mod A for self feeding using low vision and AE with cues OT Short Term Goal 2 (Week 1): Pt will require mod A simple grooming seated sink side with cues OT Short Term Goal 3 (Week 1): Pt will require min a for UB bathing and pullover shirt seated OT Short Term Goal 4 (Week 1): Pt will perform sit to stand in prep for LB self care with mod A and LRAD  Skilled Therapeutic Interventions/Progress Updates:    Treatment session initiated following discussion with OTR on POC and LTGs.  Pt resting in w/c upon arrival. Pt required max questioning cues for orientation. Reviewed purpose of rehab and LTGs. Pt surprised that he would have to be in hospital for as long as predicted. Pt required max A+1 for sit<>stand with Stedy. Pt will required assistance with self feeding 2/2 visual deficits. Nursing staff notified and sign placed on door. Grooming at sink. Pt required mod A for shaving. Pt states he didn't require assistance prior to admission to hospital. Pt remained in w/c with all needs within reach and belt alarm activated.   Therapy Documentation Precautions:  Precautions Precautions: Fall, Back Precaution Booklet Issued: Yes (comment) Precaution Comments: reviewed back precautions with pt, Required Braces or Orthoses: Spinal Brace Spinal Brace: Thoracolumbosacral orthotic, Applied in sitting position Restrictions Weight Bearing Restrictions: No  Pain:  Pt denies pain this monring   Therapy/Group: Individual Therapy  Leroy Libman 09/07/2022, 2:43 PM

## 2022-09-07 NOTE — Evaluation (Signed)
Occupational Therapy Assessment and Plan  Patient Details  Name: Justin Potter MRN: [id number] Date of Birth: [birth year]  OT Diagnosis: abnormal posture, ataxia, blindness and low vision, cognitive deficits, lumbago (low back pain), muscle weakness (generalized), progressive muscular atrophy, and swelling of limb Rehab Potential: Rehab Potential (ACUTE ONLY): Good ELOS: 2.5-3 weeks   Today's Date: 09/07/2022 OT Individual Time: [id number] OT Individual Time Calculation (min): 70 min     Hospital Problem: Principal Problem:   Compression fracture of body of thoracic vertebra (HCC)   Past Medical History:  Past Medical History:  Diagnosis Date   Anxiety    Arthritis    "in the fingers" per pt   Atrial fibrillation ([place])    Bladder neck contracture    Dysrhythmia    A-FIB / HX BRADYCARDIA - FOLLOWED BY DR. [place] DUE TO [name]   Gross hematuria    History of gout    History of prostate cancer    S/P RADIOACTIVE SEED IMPLANTS   History of squamous cell carcinoma of skin    EXCISION LOWER LIP AND RIGHT EAR   History of TIA (transient ischemic attack)    probable tia no residual  09-09-2012   Hyperlipidemia    Hypertension    Macular degeneration    both eyes   Urethral stricture    Urinary retention    Past Surgical History:  Past Surgical History:  Procedure Laterality Date   APPENDECTOMY     CATARACT EXTRACTION W/ INTRAOCULAR LENS  IMPLANT, BILATERAL     CHOLECYSTECTOMY  1977   CYSTOSCOPY WITH URETHRAL DILATATION N/A 02/12/2013   Procedure: CYSTOSCOPY WITH BALLOON DILATATION OF URETHRAL STRICTURE AND BLADDER FULGERATION;  Surgeon: Bernestine Amass, MD;  Location: [facility];  Service: Urology;  Laterality: N/A;   ESOPHAGOGASTRODUODENOSCOPY (EGD) WITH PROPOFOL N/A 11/02/2020   Procedure: ESOPHAGOGASTRODUODENOSCOPY (EGD) WITH PROPOFOL;  Surgeon: Clarene Essex, MD;  Location: [facility];  Service: Endoscopy;  Laterality: N/A;   LAPAROSCOPIC APPENDECTOMY  N/A 08/22/2013   Procedure: APPENDECTOMY LAPAROSCOPIC;  Surgeon: Pedro Earls, MD;  Location: [facility];  Service: General;  Laterality: N/A;   [place] N/A 11/02/2020   Procedure: SAVORY DILATION;  Surgeon: Clarene Essex, MD;  Location: [facility];  Service: Endoscopy;  Laterality: N/A;   TONSILLECTOMY  as child   TOTAL KNEE ARTHROPLASTY Right 11-23-2004    Assessment & Plan Clinical Impression:  Justin Potter is a 89 year old male who resides in [place] who was brought to the emergency department on 08/30/2022 after multiple falls and was found to have acute appearing compression fracture of the T6 vertebra and acute nondisplaced cortical fracture of the anterior aspect of the superior endplate of T3.  Nondisplaced spinal fracture.  He has a history of atrial fibrillation maintained on Eliquis.  CT scan of the head showed multiple small left posterior scalp hematomas and CT scan of the right hip revealed intramuscular hematoma of the gluteus maximus and soft tissue contusion.  He was treated conservatively with T LSO brace and analgesics.  Eliquis was temporarily held started on 2/10.  Blood work consistent with acute blood loss anemia and is stable and improving.  Here is requiring plus to mod assist for sit to stand from bed.  He is tolerating regular diet.The patient requires inpatient physical medicine and rehabilitation evaluations and treatment secondary to dysfunction due to thoracic spine fracture. Complains of left sided buttock pain.   Patient  currently requires total with basic self-care skills and IADL secondary to muscle weakness and muscle joint tightness, decreased cardiorespiratoy endurance, impaired timing and sequencing, unbalanced muscle activation, ataxia, decreased coordination, and decreased motor planning, decreased visual acuity, decreased memory and delayed processing, and decreased sitting balance, decreased standing balance,  decreased postural control, and decreased balance strategies.  Prior to hospitalization, patient could complete BADL's but was falling at ALF with modified independent  and needed support for bathing and IADL's from staff.   Patient will benefit from skilled intervention to decrease level of assist with basic self-care skills, increase independence with basic self-care skills, and increase level of independence with iADL prior to discharge home with care partner.  Anticipate patient will require 24 hour supervision and minimal physical assistance and follow up home health.  OT - End of Session Activity Tolerance: Tolerates 10 - 20 min activity with multiple rests Endurance Deficit: Yes Endurance Deficit Description: Pt fatigues quickly OT Assessment Rehab Potential (ACUTE ONLY): Good OT Barriers to Discharge: Decreased caregiver support;Incontinence;Other (comments) (cognition, low vision, needs to be relatively indep for ALF) OT Barriers to Discharge Comments: cognition, low vision, needs to be relatively indep for ALF, incontinence OT Patient demonstrates impairments in the following area(s): Balance;Skin Integrity;Vision;Cognition;Endurance;Motor;Pain;Safety;Sensory OT Basic ADL's Functional Problem(s): Eating;Grooming;Bathing;Dressing;Toileting OT Advanced ADL's Functional Problem(s): Simple Meal Preparation;Laundry;Light Housekeeping OT Transfers Functional Problem(s): Toilet;Tub/Shower OT Plan OT Intensity: Minimum of 1-2 x/day, 45 to 90 minutes OT Frequency: 5 out of 7 days OT Duration/Estimated Length of Stay: 2.5-3 weeks OT Treatment/Interventions: Balance/vestibular training;Disease mangement/prevention;Neuromuscular re-education;Self Care/advanced ADL retraining;Therapeutic Exercise;Wheelchair propulsion/positioning;Cognitive remediation/compensation;DME/adaptive equipment instruction;Pain management;Skin care/wound managment;UE/LE Strength taining/ROM;Community  reintegration;Functional electrical stimulation;Patient/family education;UE/LE Coordination activities;Splinting/orthotics;Discharge planning;Functional mobility training;Psychosocial support;Therapeutic Activities;Visual/perceptual remediation/compensation OT Self Feeding Anticipated Outcome(s): mod I OT Basic Self-Care Anticipated Outcome(s): mod I except shower with S OT Toileting Anticipated Outcome(s): mod I OT Bathroom Transfers Anticipated Outcome(s): Supervision/CGA OT Recommendation Recommendations for Other Services: Speech consult Patient destination: Assisted Living Follow Up Recommendations: Home health OT Equipment Recommended: Rolling walker with 5" wheels Equipment Details: RW, reports has stall shower with DME/bars, pt reports he has a rollator?   OT Evaluation Precautions/Restrictions  Precautions Precautions: Fall;Back Precaution Comments: reviewed back precautions with pt, Required Braces or Orthoses: Spinal Brace Spinal Brace: Thoracolumbosacral orthotic;Applied in sitting position Restrictions Weight Bearing Restrictions: No General Chart Reviewed: Yes Family/Caregiver Present: No Vital Signs Therapy Vitals Temp: (!) 97.5 F (36.4 C) Temp Source: Oral Pulse Rate: 63 Resp: 18 BP: (!) 107/59 Patient Position (if appropriate): Sitting Oxygen Therapy SpO2: 100 % O2 Device: Room Air Pain Pain Assessment Pain Scale: 0-10 Pain Type: Acute pain Pain Location: Back Pain Orientation: Lower Pain Descriptors / Indicators: Sore Pain Onset: Gradual Patients Stated Pain Goal: 2 Pain Intervention(s): Pain med given for lower pain score than stated, per patient request;Repositioned Multiple Pain Sites: No Home Living/Prior Functioning Home Living Living Arrangements: Other (Comment), Group Home (ALF) Available Help at Discharge: Other (Comment) (ALF staff) Type of Home: Assisted living Home Access: Level entry Home Layout: One level Bathroom Shower/Tub:  Multimedia programmer: Handicapped height Bathroom Accessibility: Yes Additional Comments: [facility], has life alert button as well as pull cords in rooms.[facility] will not take pt if he cannot transfer and pivot, no hoyer lift or heavy duty DME. Memory comes and goes  Lives With: Other (Comment) (ALF) IADL History Homemaking Responsibilities: No Occupation: Retired Type of Occupation: [employer] Prior Function Level of Independence: [place] for independence, Needs assistance  with ADLs  Able to Take Stairs?: Yes Driving: No Vocation: Retired Surveyor, mining Baseline Vision/History: 6 Macular Degeneration Ability to See in Adequate Light: 3 Highly impaired Patient Visual Report: No change from baseline Vision Assessment?: Vision impaired- to be further tested in functional context Additional Comments: severely impaired acuity impacting all aspects of ADL's and mobility Perception  Perception: Within Functional Limits Praxis Praxis: Intact Cognition Cognition Overall Cognitive Status: No family/caregiver present to determine baseline cognitive functioning Arousal/Alertness: Awake/alert Orientation Level: Person;Place Memory: Impaired Awareness: Appears intact Problem Solving: Appears intact Safety/Judgment: Appears intact Brief Interview for Mental Status (BIMS) Repetition of Three Words (First Attempt): 3 Temporal Orientation: Year: Correct Temporal Orientation: Month: Accurate within 5 days Temporal Orientation: Day: Incorrect Recall: "Sock": Yes, after cueing ("something to wear") Recall: "Blue": No, could not recall Recall: "Bed": No, could not recall BIMS Summary Score: 9 Sensation Sensation Light Touch: Appears Intact Hot/Cold: Appears Intact Proprioception: Appears Intact Stereognosis: Not tested Coordination Gross Motor Movements are Fluid and Coordinated: No Fine Motor Movements are Fluid and Coordinated:  No Coordination and Movement Description: ataxia, retropulsion, tremors Bly Finger Nose Finger Test: impaired Bly Heel Shin Test: NT 9 Hole Peg Test: UTA Motor  Motor Motor: Other (comment) Motor - Skilled Clinical Observations: generalized weakness  Trunk/Postural Assessment  Cervical Assessment Cervical Assessment: Within Functional Limits Thoracic Assessment Thoracic Assessment: Exceptions to [facility] Lumbar Assessment Lumbar Assessment: Exceptions to [facility] Postural Control Postural Control: Deficits on evaluation  Balance Balance Balance Assessed: Yes Static Sitting Balance Static Sitting - Balance Support: Feet supported Static Sitting - Level of Assistance: 4: Min assist Dynamic Sitting Balance Dynamic Sitting - Balance Support: Feet supported;During functional activity Dynamic Sitting - Level of Assistance: 3: Mod assist Extremity/Trunk Assessment RUE Assessment RUE Assessment: Exceptions to [facility] Strength Comments: B hand tremors, deconditioning, grossly 3+/5 LUE Assessment LUE Assessment: Exceptions to [facility] Strength Comments: B hand tremors, deconditioning, grossly 3+/5  Care Tool Care Tool Self Care Eating   Eating Assist Level: Total Assistance - Patient < 25%    Oral Care    Oral Care Assist Level: Maximal assistance - Patient 25 - 49%    Bathing   Body parts bathed by patient: Face;Chest;Right arm;Left arm Body parts bathed by helper: Left lower leg;Right lower leg;Left upper leg;Right upper leg;Buttocks;Abdomen   Assist Level: Dependent - Patient 0%    Upper Body Dressing(including orthotics)   What is the patient wearing?: Pull over shirt   Assist Level: Maximal Assistance - Patient 25 - 49%    Lower Body Dressing (excluding footwear)   What is the patient wearing?: Incontinence brief;Pants Assist for lower body dressing: Dependent - Patient 0%    Putting on/Taking off footwear   What is the patient wearing?: Ted hose;Non-skid slipper  socks Assist for footwear: Dependent - Patient 0%       Care Tool Toileting Toileting activity   Assist for toileting: Dependent - Patient 0%     Care Tool Bed Mobility Roll left and right activity   Roll left and right assist level: Maximal Assistance - Patient 25 - 49%    Sit to lying activity   Sit to lying assist level: Maximal Assistance - Patient 25 - 49%    Lying to sitting on side of bed activity   Lying to sitting on side of bed assist level: the ability to move from lying on the back to sitting on the side of the bed with no back support.: Total Assistance - Patient <  25%     Care Tool Transfers Sit to stand transfer   Sit to stand assist level: 2 Helpers    Chair/bed transfer   Chair/bed transfer assist level: Maximal Assistance - Patient 25 - 49%     Toilet transfer         Care Tool Cognition  Expression of Ideas and Wants Expression of Ideas and Wants: 3. Some difficulty - exhibits some difficulty with expressing needs and ideas (e.g, some words or finishing thoughts) or speech is not clear  Understanding Verbal and Non-Verbal Content Understanding Verbal and Non-Verbal Content: 2. Sometimes understands - understands only basic conversations or simple, direct phrases. Frequently requires cues to understand   Memory/Recall Ability Memory/Recall Ability : Current season;Location of own room;That he or she is in a hospital/hospital unit   Refer to Care Plan for [place] 1 OT Short Term Goal 1 (Week 1): Pt will require mod A for self feeding using low vision and AE with cues OT Short Term Goal 2 (Week 1): Pt will require mod A simple grooming seated sink side with cues OT Short Term Goal 3 (Week 1): Pt will require min a for UB bathing and pullover shirt seated OT Short Term Goal 4 (Week 1): Pt will perform sit to stand in prep for LB self care with mod A and LRAD  Recommendations for other services: Neuropsych, Speech Therapy for  cognition    Skilled Therapeutic Intervention ADL ADL Eating: Maximal assistance Where Assessed-Eating: Chair Grooming: Maximal assistance Where Assessed-Grooming: Chair Upper Body Bathing: Dependent Where Assessed-Upper Body Bathing: Sitting at sink Lower Body Bathing: Dependent Where Assessed-Lower Body Bathing: Bed level Upper Body Dressing: Maximal assistance Where Assessed-Upper Body Dressing: Edge of bed Lower Body Dressing: Dependent Where Assessed-Lower Body Dressing: Edge of bed Toileting: Dependent Where Assessed-Toileting: Glass blower/designer: Dependent Armed forces technical officer Method:  (STEDY) Science writer: Grab bars;Bedside commode Tub/Shower Transfer: Unable to assess Tub/Shower Transfer Method: Unable to assess Social research officer, government: Dependent Social research officer, government Method: Other (comment) (STEDY) Walk-In Engineer, site: Radio broadcast assistant ADL Comments: max A self feeding, grooming, Total A UB bathing and dressing, D LB bathing and dressing, toileting D with urinal and with STEDY to toilet, D TLSO Mobility  Bed Mobility Bed Mobility: Rolling Right;Rolling Left;Left Sidelying to Sit Rolling Right: Moderate Assistance - Patient 50-74% Rolling Left: Moderate Assistance - Patient 50-74% Left Sidelying to Sit: Maximal Assistance - Patient 25-49% Transfers Sit to Stand: 2 Helpers  OT Treatment/Intervention:  Pt seen for full initial OT evaluation and training session this am. Pt in bed upon OT arrival. OT introduced role of therapy and purpose of session. OT conducted comprehensive OT evaluation and treatment session with status outlined in eval. Pt needs TLSO brace when OOB. Required 2 person A for it to stand and STEDY for safe transfers at this time. B hand tremors, vision and cog deficits impact ADL's as well. .OT care coordination completed for initiating OT treatment. Pt will benefit from CIR to maximize functional level and safety prior to discharge  to ALF with staff and family support. Pt left at end of session up in w/c with chair alarm set, tray table and nurse call bell within reach.    Discharge Criteria: Patient will be discharged from OT if patient refuses treatment 3 consecutive times without medical reason, if treatment goals not met, if there is a change in medical status, if patient makes no progress towards goals or  if patient is discharged from hospital.  The above assessment, treatment plan, treatment alternatives and goals were discussed and mutually agreed upon: by patient  Barnabas Lister 09/07/2022, 4:32 PM

## 2022-09-07 NOTE — Plan of Care (Signed)
  Problem: RH Balance Goal: LTG: Patient will maintain dynamic sitting balance (OT) Description: LTG:  Patient will maintain dynamic sitting balance with assistance during activities of daily living (OT) Flowsheets (Taken 09/07/2022 1609) LTG: Pt will maintain dynamic sitting balance during ADLs with: Independent with assistive device Goal: LTG Patient will maintain dynamic standing with ADLs (OT) Description: LTG:  Patient will maintain dynamic standing balance with assist during activities of daily living (OT)  Flowsheets (Taken 09/07/2022 1609) LTG: Pt will maintain dynamic standing balance during ADLs with: Supervision/Verbal cueing   Problem: Sit to Stand Goal: LTG:  Patient will perform sit to stand in prep for activites of daily living with assistance level (OT) Description: LTG:  Patient will perform sit to stand in prep for activites of daily living with assistance level (OT) Flowsheets (Taken 09/07/2022 1609) LTG: PT will perform sit to stand in prep for activites of daily living with assistance level: Supervision/Verbal cueing   Problem: RH Eating Goal: LTG Patient will perform eating w/assist, cues/equip (OT) Description: LTG: Patient will perform eating with assist, with/without cues using equipment (OT) Flowsheets (Taken 09/07/2022 1609) LTG: Pt will perform eating with assistance level of: Independent with assistive device    Problem: RH Grooming Goal: LTG Patient will perform grooming w/assist,cues/equip (OT) Description: LTG: Patient will perform grooming with assist, with/without cues using equipment (OT) Flowsheets (Taken 09/07/2022 1609) LTG: Pt will perform grooming with assistance level of: Independent with assistive device    Problem: RH Bathing Goal: LTG Patient will bathe all body parts with assist levels (OT) Description: LTG: Patient will bathe all body parts with assist levels (OT) Flowsheets (Taken 09/07/2022 1609) LTG: Pt will perform bathing with assistance  level/cueing: Supervision/Verbal cueing   Problem: RH Dressing Goal: LTG Patient will perform upper body dressing (OT) Description: LTG Patient will perform upper body dressing with assist, with/without cues (OT). Flowsheets (Taken 09/07/2022 1609) LTG: Pt will perform upper body dressing with assistance level of: Independent with assistive device Goal: LTG Patient will perform lower body dressing w/assist (OT) Description: LTG: Patient will perform lower body dressing with assist, with/without cues in positioning using equipment (OT) Flowsheets (Taken 09/07/2022 1609) LTG: Pt will perform lower body dressing with assistance level of: Supervision/Verbal cueing   Problem: RH Toileting Goal: LTG Patient will perform toileting task (3/3 steps) with assistance level (OT) Description: LTG: Patient will perform toileting task (3/3 steps) with assistance level (OT)  Flowsheets (Taken 09/07/2022 1609) LTG: Pt will perform toileting task (3/3 steps) with assistance level: Supervision/Verbal cueing   Problem: RH Vision Goal: RH LTG Vision Theme park manager) Flowsheets (Taken 09/07/2022 1609) LTG: Vision Goals: Pt will utilize visual compensatory strategies for ADL's incl self feeding with mod I   Problem: RH Toilet Transfers Goal: LTG Patient will perform toilet transfers w/assist (OT) Description: LTG: Patient will perform toilet transfers with assist, with/without cues using equipment (OT) Flowsheets (Taken 09/07/2022 1609) LTG: Pt will perform toilet transfers with assistance level of: Supervision/Verbal cueing   Problem: RH Tub/Shower Transfers Goal: LTG Patient will perform tub/shower transfers w/assist (OT) Description: LTG: Patient will perform tub/shower transfers with assist, with/without cues using equipment (OT) Flowsheets (Taken 09/07/2022 1609) LTG: Pt will perform tub/shower stall transfers with assistance level of: Contact Guard/Touching assist

## 2022-09-08 DIAGNOSIS — S22000A Wedge compression fracture of unspecified thoracic vertebra, initial encounter for closed fracture: Secondary | ICD-10-CM | POA: Diagnosis not present

## 2022-09-08 NOTE — Care Management (Signed)
[facility] Individual Statement of Services  Patient Name:  Justin Potter  Date:  09/08/2022  Welcome to the [facility].  Our goal is to provide you with an individualized program based on your diagnosis and situation, designed to meet your specific needs.  With this comprehensive rehabilitation program, you will be expected to participate in at least 3 hours of rehabilitation therapies Monday-Friday, with modified therapy programming on the weekends.  Your rehabilitation program will include the following services:  Physical Therapy (PT), Occupational Therapy (OT), 24 hour per day rehabilitation nursing, Therapeutic Recreaction (TR), Psychology, Neuropsychology, Care Coordinator, Rehabilitation Medicine, [place], and Other  Weekly team conferences will be held on Tuesdays to discuss your progress.  Your Inpatient Rehabilitation Care Coordinator will talk with you frequently to get your input and to update you on team discussions.  Team conferences with you and your family in attendance may also be held.  Expected length of stay: 2-3 weeks    Overall anticipated outcome: Supervision  Depending on your progress and recovery, your program may change. Your Inpatient Rehabilitation Care Coordinator will coordinate services and will keep you informed of any changes. Your Inpatient Rehabilitation Care Coordinator's name and contact numbers are listed  below.  The following services may also be recommended but are not provided by the [facility] will be made to provide these services after discharge if needed.  Arrangements include referral to agencies that provide these services.  Your insurance has been verified to be:  Medicare A/B  Your primary doctor is:  [name]  Pertinent information will be shared with your doctor and your insurance company.  Inpatient Rehabilitation Care Coordinator:  Cathleen Corti [phone] or ([phone]  Information discussed with and copy given to patient by: Rana Snare, 09/08/2022, 10:38 AM

## 2022-09-08 NOTE — Progress Notes (Signed)
Occupational Therapy Session Note  Patient Details  Name: Justin Potter MRN: [id number] Date of Birth: 02-12-1926  Today's Date: 09/08/2022 OT Individual Time: 1300-1415 OT Individual Time Calculation (min): 75 min    Short Term Goals: Week 1:  OT Short Term Goal 1 (Week 1): Pt will require mod A for self feeding using low vision and AE with cues OT Short Term Goal 2 (Week 1): Pt will require mod A simple grooming seated sink side with cues OT Short Term Goal 3 (Week 1): Pt will require min a for UB bathing and pullover shirt seated OT Short Term Goal 4 (Week 1): Pt will perform sit to stand in prep for LB self care with mod A and LRAD  Skilled Therapeutic Interventions/Progress Updates:    1:1 Pt received in the bed. Facilitated log rolling in bed to come to EOB with mod A at a very slow pace and multimodal cuing. Throughout session pt continues to be confused and thinking he is at [place] and talking about people that are not present. Sitting EOB pt donned TLSO with total A. Max A to work to put on [employer] with toes cut out. Pt then performed sit to stand from elevated bed with mod A with RW. Pt unable to sustained for anytime - attempted again with success and then was able to transfer to the w/c stand pivot with RW with guidance and encouragement for posture. Pt required mod A for controlled decent. Transitioned the w/c to outside the gym. Place a target to try to ambulate to but pt kept reporting fatigue with each stand and unable. However pt was able to perform 6-8 sit to stands with RW with mod to max A and sustain standing for ~ 30 sec before sitting with min to mod A for control. Pt self propelled w/c down the green line 30 feet with lots of rest breaks. Propelled backwards back down the green line. Pt then performed stand step transfer back into the recliner with RW with max A to stand and then min to pivot and min to sit on elevated seat (with cushion and pillow). Safety belt  and snack provided at request with call bell. Pt continues to be confused about place and time.   Therapy Documentation Precautions:  Precautions Precautions: Fall, Back Precaution Booklet Issued: Yes (comment) Precaution Comments: reviewed back precautions with pt, Required Braces or Orthoses: Spinal Brace Spinal Brace: Thoracolumbosacral orthotic, Applied in sitting position Restrictions Weight Bearing Restrictions: No  Pain:   Ongoing pain in his back but continues as able.   Therapy/Group: Individual Therapy  Willeen Cass Barnet [facility] 09/08/2022, 2:51 PM

## 2022-09-08 NOTE — Progress Notes (Signed)
PROGRESS NOTE   Subjective/Complaints:   Pt reports doesn't remember meeting me yesterday Did do therapy yesterday but cnanot tell me anything about what he did Is having regular pain in back, and legs, but couldn't quantify.   LBM x2 yesterday and 1 this AM.    ROS:  Pt denies SOB, abd pain, CP, N/V/C/D, and vision changes Except for HPI Objective:   No results found. Recent Labs    09/06/22 0232 09/07/22 0806  WBC 9.3 10.4  HGB 9.8* 10.4*  HCT 30.1* 32.4*  PLT 336 393   Recent Labs    09/06/22 0232 09/07/22 0806  NA 137 137  K 4.5 4.0  CL 103 104  CO2 26 25  GLUCOSE 104* 111*  BUN 24* 23  CREATININE 0.94 0.89  CALCIUM 8.5* 8.5*    Intake/Output Summary (Last 24 hours) at 09/08/2022 1306 Last data filed at 09/08/2022 1241 Gross per 24 hour  Intake 294 ml  Output 350 ml  Net -56 ml        Physical Exam: Vital Signs Blood pressure (!) 165/70, pulse (!) 101, temperature 98.2 F (36.8 C), temperature source Oral, resp. rate 19, height 6' 2"$  (1.88 m), weight 79.6 kg, SpO2 99 %.     General: awake, alert, slightly inappropriate, supine in bed; just woke up ; NAD HENT: conjugate gaze; oropharynx moist CV: irregular rhythm; borderline tachycardic  rate; no JVD Pulmonary: CTA B/L; no W/R/R- good air movement GI: soft, NT, ND, (+)BS- more normoactive Psychiatric: irritable with needing to speak with physician Neurological: slightly confused;  Musculoskeletal:     Right lower leg: No edema.     Left lower leg: No edema. Diffuse 4/5 strength with the exceptio of left lower extremity which is extremely limited by pain Assessment/Plan: 1. Functional deficits which require 3+ hours per day of interdisciplinary therapy in a comprehensive inpatient rehab setting. Physiatrist is providing close team supervision and 24 hour management of active medical problems listed below. Physiatrist and rehab team  continue to assess barriers to discharge/monitor patient progress toward functional and medical goals  Care Tool:  Bathing    Body parts bathed by patient: Face, Chest, Right arm, Left arm   Body parts bathed by helper: Left lower leg, Right lower leg, Left upper leg, Right upper leg, Buttocks, Abdomen     Bathing assist Assist Level: Dependent - Patient 0%     Upper Body Dressing/Undressing Upper body dressing   What is the patient wearing?: Pull over shirt    Upper body assist Assist Level: Maximal Assistance - Patient 25 - 49%    Lower Body Dressing/Undressing Lower body dressing      What is the patient wearing?: Incontinence brief, Pants     Lower body assist Assist for lower body dressing: Dependent - Patient 0%     Toileting Toileting    Toileting assist Assist for toileting: Dependent - Patient 0%     Transfers Chair/bed transfer  Transfers assist     Chair/bed transfer assist level: Maximal Assistance - Patient 25 - 49%     Locomotion Ambulation   Ambulation assist   Ambulation activity did not occur: Safety/medical concerns  Walk 10 feet activity   Assist  Walk 10 feet activity did not occur: Safety/medical concerns        Walk 50 feet activity   Assist           Walk 150 feet activity   Assist Walk 150 feet activity did not occur: Safety/medical concerns         Walk 10 feet on uneven surface  activity   Assist Walk 10 feet on uneven surfaces activity did not occur: Safety/medical concerns         Wheelchair     Assist Is the patient using a wheelchair?: Yes Type of Wheelchair: Manual    Wheelchair assist level: Supervision/Verbal cueing Max wheelchair distance: 120 ft    Wheelchair 50 feet with 2 turns activity    Assist        Assist Level: Supervision/Verbal cueing   Wheelchair 150 feet activity     Assist      Assist Level: Minimal Assistance - Patient > 75%   Blood  pressure (!) 165/70, pulse (!) 101, temperature 98.2 F (36.8 C), temperature source Oral, resp. rate 19, height 6' 2"$  (1.88 m), weight 79.6 kg, SpO2 99 %.   Medical Problem List and Plan: 1. Functional deficits secondary to thoracic spine compression fracture             -patient may shower             -ELOS/Goals: 10-14 days Min-ModA             Con't CIR PT and OT- and IPOC done 2.  Antithrombotics: -DVT/anticoagulation:  Pharmaceutical: Eliquis for Afib             -antiplatelet therapy:    3. Left buttock  back and leg pain: Continue Tylenol as needed; tramadol as needed.    2/15- will schedule Tramadol 50 mg q6 hours and cont' tylenol prn.   2/16- pt said still having pain, but wasn't able to quantify- appeared in less pain than yesterday- admitted having "same pain" which took tylenol at home for.  4. Delirium: continue Seroquel 25 mg q HS   2/15- a little confused in AM per son- will monitor and con't Seroquel for now 5. Neuropsych/cognition: This patient is capable of making decisions on his own behalf.   6. Skin/Wound Care: Routine skin care checks   7. Fluids/Electrolytes/Nutrition: Routine Is and Os and follow-up chemistries   8: Hypertension: monitor TID and prn             -continue hydralazine 10 mg TID and monitor with ambulation   2/15- BP a little elevated- but hurting more- will monitor for trend before changing meds  2/15- hydralazine was held last night- BP 123456 systolic this AM- will monitor trend- if continues, might need ot increase Bp meds.  9: Mildly elevated BUN: encourage PO liquids; follow-up BMP   2/15- Cr stable- BUN 23- slightly dry- will encourage fluids.  10: Anemia; Recent drop likely due to hematoma             2/15- New Hb 10.4- doing better  11: Constipation: will give Sorbitol tonight   2/15- smear last night- but no results so far-  Will give Sorbitol 30cc again after therapy today.   2/16- 2 Bms yesterday and 1 this AM- feeling better 12.  Afib- con't Eliquis for this- is rate controlled     I spent a total of 36   minutes on total  care today- >50% coordination of care- due to  D/w nursing as well as IPOC    LOS: 2 days A FACE TO FACE EVALUATION WAS PERFORMED  Justin Potter 09/08/2022, 1:06 PM

## 2022-09-08 NOTE — IPOC Note (Signed)
Overall Plan of Care [facility]) Patient Details Name: Justin Potter MRN: [id number] DOB: 28-Oct-1925  Admitting Diagnosis: Compression fracture of body of thoracic vertebra [facility])  Hospital Problems: Principal Problem:   Compression fracture of body of thoracic vertebra [facility])     Functional Problem List: Nursing Pain, Bladder, Bowel, Safety, Edema, Sensory, Endurance, Medication Management, Motor  PT Balance, Skin Integrity, Edema, Endurance, Motor, Pain, Safety, Sensory, Perception  OT Balance, Skin Integrity, Vision, Cognition, Endurance, Motor, Pain, Safety, Sensory  SLP    TR         Basic ADL's: OT Eating, Grooming, Bathing, Dressing, Toileting     Advanced  ADL's: OT Simple Meal Preparation, Laundry, Light Housekeeping     Transfers: PT Bed Mobility, Bed to Chair  OT Toilet, Tub/Shower     Locomotion: PT Ambulation, Wheelchair Mobility     Additional Impairments: OT    SLP        TR      Anticipated Outcomes Item Anticipated Outcome  Self Feeding mod I  Swallowing      Basic self-care  mod I except shower with S  Toileting  mod I   Bathroom Transfers Supervision/CGA  Bowel/Bladder  Continent B/B  Transfers  min A with LRAD  Locomotion  supervision w/c  Communication     Cognition     Pain  less than 3  Safety/Judgment  remain fall free while in rehab   Therapy Plan: PT Intensity: Minimum of 1-2 x/day ,45 to 90 minutes PT Frequency: 5 out of 7 days PT Duration Estimated Length of Stay: 2-3 weeks OT Intensity: Minimum of 1-2 x/day, 45 to 90 minutes OT Frequency: 5 out of 7 days OT Duration/Estimated Length of Stay: 2.5-3 weeks     Team Interventions: Nursing Interventions Patient/Family Education, Pain Management, Bladder Management, Medication Management, Discharge Planning, Bowel Management, Skin Care/Wound Management, Psychosocial Support, Disease Management/Prevention  PT interventions Ambulation/gait training, Discharge planning,  Functional mobility training, Psychosocial support, Therapeutic Activities, Balance/vestibular training, Disease management/prevention, Neuromuscular re-education, Skin care/wound management, Therapeutic Exercise, Wheelchair propulsion/positioning, Cognitive remediation/compensation, DME/adaptive equipment instruction, Pain management, Splinting/orthotics, UE/LE Strength taining/ROM, UE/LE Coordination activities, Stair training, Patient/family education, Functional electrical stimulation, Community reintegration, Visual/perceptual remediation/compensation  OT Interventions Training and development officer, Disease mangement/prevention, Neuromuscular re-education, Self Care/advanced ADL retraining, Therapeutic Exercise, Wheelchair propulsion/positioning, Cognitive remediation/compensation, DME/adaptive equipment instruction, Pain management, Skin care/wound managment, UE/LE Strength taining/ROM, Community reintegration, Technical sales engineer stimulation, Patient/family education, UE/LE Coordination activities, Splinting/orthotics, Discharge planning, Functional mobility training, Psychosocial support, Therapeutic Activities, Visual/perceptual remediation/compensation  SLP Interventions    TR Interventions    SW/CM Interventions Discharge Planning, Psychosocial Support, Patient/Family Education   Barriers to Discharge MD  Medical stability, Home enviroment access/loayout, Neurogenic bowel and bladder, Insurance for SNF coverage, Weight bearing restrictions, Behavior, and delirium  Nursing Decreased caregiver support, Home environment access/layout, Incontinence, Wound Care, Lack of/limited family support, Insurance for SNF coverage [place] 1 level with level entry  PT Home environment access/layout, Decreased caregiver support, Lack of/limited family support    OT Decreased caregiver support, Incontinence, Other (comments) (cognition, low vision, needs to be relatively indep for ALF) cognition, low  vision, needs to be relatively indep for ALF, incontinence  SLP      SW Decreased caregiver support, Lack of/limited family support     Team Discharge Planning: Destination: PT-Assisted Living ,OT- Assisted Living , SLP-  Projected Follow-up: PT-Other (comment), OT-  Home health OT, SLP-  Projected Equipment Needs: PT-Standard walker, OT- Rolling walker with 5" wheels, SLP-  Equipment Details: PT- , OT-RW, reports has stall shower with DME/bars, pt reports he has a rollator? Patient/family involved in discharge planning: PT- Patient,  OT-Patient, SLP-   MD ELOS: 2.5 to 3 weeks Medical Rehab Prognosis:  Good Assessment: The patient has been admitted for CIR therapies with the diagnosis of compression fx. The team will be addressing functional mobility, strength, stamina, balance, safety, adaptive techniques and equipment, self-care, bowel and bladder mgt, patient and caregiver education, . Goals have been set at mod I to supervision. Anticipated discharge destination is independent living/ALF.        See Team Conference Notes for weekly updates to the plan of care

## 2022-09-08 NOTE — Progress Notes (Signed)
Patient calling help out into the hall way. The NT went into the room to see what the patient needed & called this nurse to come to the room. She stated that the patient wanted his brace to be removed, but it's supposed to be on when he is out of bed. He was sitting in the recliner pulling at it. I informed the patient that we could remove it, but we would have to put him back in the bed & asked him if he wanted to do that. He said nevermind. I asked if it was ok, to get clarification that he wanted to stay in the chair. He said  "No it's not ok & get out". This nurse & the NT left his room.

## 2022-09-08 NOTE — Progress Notes (Signed)
Patient ID: Justin Potter, male   DOB: [birth year], 89 y.o.   MRN: [id number]  SW met with pt and pt sister to introduce self, explain role, discuss discharge process, and inform on ELOS. Family realistic about progress, and prepared for SNF placement if pt is not physically able to return to ALF. SW informed will provide updates after team conference.   Loralee Pacas, MSW, [place] Office: [phone] Cell: [phone] Fax: [phone]

## 2022-09-08 NOTE — Progress Notes (Signed)
Physical Therapy Session Note  Patient Details  Name: Justin Potter MRN: [id number] Date of Birth: [birth year]  Today's Date: 09/08/2022 PT Individual Time: 0850-0959 PT Individual Time Calculation (min): 69 min   Short Term Goals: Week 1:  PT Short Term Goal 1 (Week 1): Pt will stand with +1 assist PT Short Term Goal 2 (Week 1): Pt will perform squat pivot transfer with mod A PT Short Term Goal 3 (Week 1): Pt will initiate standing transfer training  Skilled Therapeutic Interventions/Progress Updates:     Pt received semi reclined in bed and agrees to therapy. Reports pain in L shoulder, bilateral legs, and bilateral big toes. PT provides rest breaks as needed to manage pain, as well as repositioning. PT provides totalA to don thigh high ted hose, as well as threading pants over both legs. Pt performs supine to sit with logrolling cues, as well as modA/maxA and facilitation of sequencing and positioning at EOB. Pt given additional time at EOB to acclimate to upright positioning. PT provides totalA to don TLSO and shoes while pt is in short sitting. Bed elevated for increased ease of transfer. Pt performs sit to almost-stand with maxA at hips to facilitate extension. Pt remains forward flexed and has increase in anxiety about falling. PT provides assistance to pull pants over hips then pivot to WC with maxA/totalA. WC transport to gym. Pt performs stand/squat pivot to Nustep with maxA/totalA and cues for hand placement, sequencing, and positioning. Pt completes Nustep for endurance training as well as increasing available ROM in bilateral knees and hips, as pt reports stiffness bilaterally. Pt completes x12:00 at workload of 5 with average steps per minute ~45. PT provides cues for hand and foot placement, as well as using theraband to support feet on foot plates. Following, pt performs sit to stand with modA, but is unable to initiate steps for transfer, instead performing stand pivot with maxA and  facilitation of lateral hip shift. PT provides pt with foot rests with strap to accommodate feet, as pt was having difficulty keeping feet on foot rests of WC during session. Pt left seated in WC with alarm intact and all needs within reach.   Therapy Documentation Precautions:  Precautions Precautions: Fall, Back Precaution Booklet Issued: Yes (comment) Precaution Comments: reviewed back precautions with pt, Required Braces or Orthoses: Spinal Brace Spinal Brace: Thoracolumbosacral orthotic, Applied in sitting position Restrictions Weight Bearing Restrictions: No    Therapy/Group: Individual Therapy  Breck Coons, PT, DPT 09/08/2022, 3:42 PM

## 2022-09-08 NOTE — Progress Notes (Signed)
Occupational Therapy Session Note  Patient Details  Name: Justin Potter MRN: [id number] Date of Birth: [birth year]  Today's Date: 09/08/2022 OT Individual Time: 1050-1140 OT Individual Time Calculation (min): 50 min    Short Term Goals: Week 1:  OT Short Term Goal 1 (Week 1): Pt will require mod A for self feeding using low vision and AE with cues OT Short Term Goal 2 (Week 1): Pt will require mod A simple grooming seated sink side with cues OT Short Term Goal 3 (Week 1): Pt will require min a for UB bathing and pullover shirt seated OT Short Term Goal 4 (Week 1): Pt will perform sit to stand in prep for LB self care with mod A and LRAD  Skilled Therapeutic Interventions/Progress Updates:     Pt received in bed with unrated back pain. Rest and repositiong provided for pain relief  ADL: Pt requires total A to manage doffing and donning TLSO in sitting in w/c. S to doff shirt, min to orient new shirt for donning and open up bottom. Taken to tx gym in parallel bars and pt stands with heavy MOD A with retrolusion and poor terminal hip extension despite cuing/facilitation. Pt reporting need to toilet. Returned to room and attempted ot use stedy in perch position with max A to rise from w/c. Pt unable to void perched on stedy as pt remains confused if sitting or standing. Taken to bed to urinate and change soiled brief with +2 A from tech.  Pt left at end of session in bed with exit alarm on, call light in reach and all needs met   Therapy Documentation Precautions:  Precautions Precautions: Fall, Back Precaution Booklet Issued: Yes (comment) Precaution Comments: reviewed back precautions with pt, Required Braces or Orthoses: Spinal Brace Spinal Brace: Thoracolumbosacral orthotic, Applied in sitting position Restrictions Weight Bearing Restrictions: No General:    Therapy/Group: Individual Therapy  Tonny Branch 09/08/2022, 12:21 PM

## 2022-09-09 DIAGNOSIS — R410 Disorientation, unspecified: Secondary | ICD-10-CM

## 2022-09-09 DIAGNOSIS — R52 Pain, unspecified: Secondary | ICD-10-CM

## 2022-09-09 DIAGNOSIS — I4891 Unspecified atrial fibrillation: Secondary | ICD-10-CM

## 2022-09-09 NOTE — Progress Notes (Signed)
PROGRESS NOTE   Subjective/Complaints: No new complaints this morning Sleepy As per nursing note was agitated regarding his brace yesterday afternoon  ROS:  Pt denies SOB, abd pain, CP, N/V/C/D, and vision changes Except for HPI Objective:   No results found. Recent Labs    09/07/22 0806  WBC 10.4  HGB 10.4*  HCT 32.4*  PLT 393   Recent Labs    09/07/22 0806  NA 137  K 4.0  CL 104  CO2 25  GLUCOSE 111*  BUN 23  CREATININE 0.89  CALCIUM 8.5*   No intake or output data in the 24 hours ending 09/09/22 1413       Physical Exam: Vital Signs Blood pressure 116/70, pulse 72, temperature 98.5 F (36.9 C), temperature source Oral, resp. rate 16, height 6' 2"$  (1.88 m), weight 79.6 kg, SpO2 98 %.  Gen: no distress, normal appearing HEENT: oral mucosa pink and moist, NCAT Cardio: Reg rate Chest: normal effort, normal rate of breathing GI: soft, NT, ND, (+)BS- more normoactive Psychiatric: irritable with needing to speak with physician Neurological: slightly confused;  Musculoskeletal:     Right lower leg: No edema.     Left lower leg: No edema. Diffuse 4/5 strength with the exceptio of left lower extremity which is extremely limited by pain Assessment/Plan: 1. Functional deficits which require 3+ hours per day of interdisciplinary therapy in a comprehensive inpatient rehab setting. Physiatrist is providing close team supervision and 24 hour management of active medical problems listed below. Physiatrist and rehab team continue to assess barriers to discharge/monitor patient progress toward functional and medical goals  Care Tool:  Bathing    Body parts bathed by patient: Face, Chest, Right arm, Left arm   Body parts bathed by helper: Left lower leg, Right lower leg, Left upper leg, Right upper leg, Buttocks, Abdomen     Bathing assist Assist Level: Dependent - Patient 0%     Upper Body  Dressing/Undressing Upper body dressing   What is the patient wearing?: Pull over shirt    Upper body assist Assist Level: Maximal Assistance - Patient 25 - 49%    Lower Body Dressing/Undressing Lower body dressing      What is the patient wearing?: Incontinence brief, Pants     Lower body assist Assist for lower body dressing: Dependent - Patient 0%     Toileting Toileting    Toileting assist Assist for toileting: Dependent - Patient 0%     Transfers Chair/bed transfer  Transfers assist     Chair/bed transfer assist level: Moderate Assistance - Patient 50 - 74%     Locomotion Ambulation   Ambulation assist   Ambulation activity did not occur: Safety/medical concerns          Walk 10 feet activity   Assist  Walk 10 feet activity did not occur: Safety/medical concerns        Walk 50 feet activity   Assist           Walk 150 feet activity   Assist Walk 150 feet activity did not occur: Safety/medical concerns         Walk 10 feet on uneven surface  activity   Assist Walk 10 feet on uneven surfaces activity did not occur: Safety/medical concerns         Wheelchair     Assist Is the patient using a wheelchair?: Yes Type of Wheelchair: Manual    Wheelchair assist level: Supervision/Verbal cueing Max wheelchair distance: 120 ft    Wheelchair 50 feet with 2 turns activity    Assist        Assist Level: Supervision/Verbal cueing   Wheelchair 150 feet activity     Assist      Assist Level: Minimal Assistance - Patient > 75%   Blood pressure 116/70, pulse 72, temperature 98.5 F (36.9 C), temperature source Oral, resp. rate 16, height 6' 2"$  (1.88 m), weight 79.6 kg, SpO2 98 %.   Medical Problem List and Plan: 1. Functional deficits secondary to thoracic spine compression fracture             -patient may shower             -ELOS/Goals: 10-14 days Min-ModA            Continue CIR 2.  Afib: continue  eliquis    3. Left buttock  back and leg pain: Continue Tylenol as needed; tramadol as needed.    2/15- will schedule Tramadol 50 mg q6 hours and cont' tylenol prn.   2/16- pt said still having pain, but wasn't able to quantify- appeared in less pain than yesterday- admitted having "same pain" which took tylenol at home for.  4. Delirium: continue Seroquel 25 mg q HS   2/15- a little confused in AM per son- will monitor and con't Seroquel for now 5. Neuropsych/cognition: This patient is capable of making decisions on his own behalf.   6. Skin/Wound Care: Routine skin care checks   7. Fluids/Electrolytes/Nutrition: Routine Is and Os and follow-up chemistries   8: Hypertension: monitor TID and prn             -continue hydralazine 10 mg TID and monitor with ambulation   2/15- BP a little elevated- but hurting more- will monitor for trend before changing meds  2/15- hydralazine was held last night- BP 123456 systolic this AM- will monitor trend- if continues, might need ot increase Bp meds.  9: Mildly elevated BUN: encourage PO liquids; follow-up BMP   2/15- Cr stable- BUN 23- slightly dry- will encourage fluids.  10: Anemia; Recent drop likely due to hematoma             2/15- New Hb 10.4- doing better  11: Constipation: will give Sorbitol tonight   2/15- smear last night- but no results so far-  Will give Sorbitol 30cc again after therapy today.   2/16- 2 Bms yesterday and 1 this AM- feeling better    LOS: 3 days A FACE TO FACE EVALUATION WAS PERFORMED  Martha Clan P Maciah Schweigert 09/09/2022, 2:13 PM

## 2022-09-09 NOTE — Progress Notes (Signed)
Pt refused hs medication. Attempted several times but pt non compliant. Pt is noted with confusion. On call provider [address] notified. Pt BP 174/78

## 2022-09-10 MED ORDER — LORAZEPAM 2 MG/ML PO CONC: 1.0000 mg | Freq: Once | ORAL | Status: DC

## 2022-09-10 MED ORDER — LORAZEPAM 2 MG/ML IJ SOLN
1.0000 mg | Freq: Once | INTRAMUSCULAR | Status: AC
  Administered 2022-09-10: 1 mg via INTRAMUSCULAR
  Filled 2022-09-10: qty 1

## 2022-09-10 MED ORDER — LORAZEPAM 0.5 MG PO TABS: 1.0000 mg | ORAL_TABLET | Freq: Once | ORAL | Status: DC

## 2022-09-10 MED ORDER — LORAZEPAM 0.5 MG PO TABS: 1.0000 mg | ORAL_TABLET | Freq: Once | ORAL | Status: AC

## 2022-09-10 MED ORDER — QUETIAPINE FUMARATE 25 MG PO TABS: 12.5000 mg | ORAL_TABLET | Freq: Every day | ORAL | Status: DC | PRN

## 2022-09-10 NOTE — Progress Notes (Signed)
Physical Therapy Session Note  Patient Details  Name: Justin Potter MRN: [id number] Date of Birth: 23-May-1926  Today's Date: 09/10/2022 PT Individual Time: [id number] and [id number] PT Individual Time Calculation (min): 59 min and 46 min.  Short Term Goals: Week 1:  PT Short Term Goal 1 (Week 1): Pt will stand with +1 assist PT Short Term Goal 2 (Week 1): Pt will perform squat pivot transfer with mod A PT Short Term Goal 3 (Week 1): Pt will initiate standing transfer training  Skilled Therapeutic Interventions/Progress Updates:   First session: Pt presents sitting almost upright in bed and somewhat confused.  Pt waxes and wanes during session w/ participation, asking " Do I have to do this again"  "I've already proved myself."  When donning TLSO, pt states " I need a doctor to tell me why this is necessary"  Pt eventually allows but increasingly irritated w/ PT.  Pt requires constant re-stating of need for therapy.  PT donned THT w/ total A w/ care taken 2/2 toe pain and then threads pants over feet and pt then automatically reaches to pull pants over knees w/ max A from PT.  Pt rolls to L w/ mod A to complete roll and then mod-max A for sidelying to sitting EOB.  Once upright pt able to maintain seated position.  Shoes w/ cut-outs for painful toes donned w/ total A although pt does push feet in. Pt given pull over long sleeve shirt and pushes arms through but requires assist to pull over head 2/2 visual deficits.  TLSO donned w/ much encouragement and education in sitting.  Pt attempted stand w/ elevated bed x max A and verbal cues, but unable to reach upright posture before pt states "that's enough, why do we need to do this?"  PT explained need to pull up pants and then transfer to w/c.  RN requested for +2 for pants.  Pt then stood w/ mod to max A for final 1/3 of transfer when reaching for RW. Pt performed step-pivot bed > w/c w/ max A flexed posture.  Pt required verbal cues and encouragement to  step.  Pt remained sitting in w/c w/ chair alarm on and all needs in reach.  Pt practiced pushing nurse call bell w/ modifications already present but unable.  Spoke w/ nursing and will look for better control.  Missed time 16 min 2/2 agitation and refusal to continue.  Second session:  Pt presents sitting in w/c w/ family member present.  Pt seems to be in better spirits this PM.  Pt wheeled to small gym after placing feet on foot rests (extended time).  Pt performed UBE at Level 1 x 4" x 2 w/ hand over hand for placement on handles (unable to see bright green handles) and then for initiation.  Once started pt will continue but occasional loss of hand holds requires hand over hand placement again.  Pt requires extended rest breaks 2/2 fatigue.  TLSO continues to ride up, so re-adjusted to fit under arms.  Pt attempts sit to stand transfers w/ max to Total A and then max to maintain "upright" posture.  Pt stands w/ flexed knees and unable to maintain > 30 seconds.  Pt attempts to scoot forward in chair when requested to scoot back.  Min A to scoot back into chair w/ facilitation at back of pants.  Pt very fatigues and returned to room, remaining sitting in w/c w/ chair alarm on and all needs in reach.  Spoke w/ NT to retrieve "soft-touch" call bell for pt.  Missed time of 14 min 2/2 fatigue.     Therapy Documentation Precautions:  Precautions Precautions: Fall, Back Precaution Booklet Issued: Yes (comment) Precaution Comments: reviewed back precautions with pt, Required Braces or Orthoses: Spinal Brace Spinal Brace: Thoracolumbosacral orthotic, Applied in sitting position Restrictions Weight Bearing Restrictions: No General: PT Amount of Missed Time (min): 16 Minutes PT Missed Treatment Reason: Patient unwilling to participate;Increased agitation Vital Signs: Therapy Vitals Temp: 98.4 F (36.9 C) Temp Source: Oral Pulse Rate: 79 Resp: 15 BP: (!) 171/98 Patient Position (if appropriate):  Lying Oxygen Therapy SpO2: 99 % O2 Device: Room Air Pain:t states 7-8/10 w/ pain meds already given per nursing.      Therapy/Group: Individual Therapy  [name] 09/10/2022, 9:48 AM

## 2022-09-10 NOTE — Progress Notes (Signed)
PROGRESS NOTE   Subjective/Complaints: No new complaints this morning Gets easily irritable Staff is helping him to try to urinate using the urinal  ROS:  Pt denies SOB, abd pain, CP, N/V/C/D, and vision changes Except for HPI Objective:   No results found. No results for input(s): "WBC", "HGB", "HCT", "PLT" in the last 72 hours.  No results for input(s): "NA", "K", "CL", "CO2", "GLUCOSE", "BUN", "CREATININE", "CALCIUM" in the last 72 hours.   Intake/Output Summary (Last 24 hours) at 09/10/2022 0828 Last data filed at 09/10/2022 0817 Gross per 24 hour  Intake 180 ml  Output --  Net 180 ml         Physical Exam: Vital Signs Blood pressure (!) 171/98, pulse 79, temperature 98.4 F (36.9 C), temperature source Oral, resp. rate 15, height 6' 2"$  (1.88 m), weight 79.6 kg, SpO2 99 %.  Gen: no distress, normal appearing HEENT: oral mucosa pink and moist, NCAT Cardio: Reg rate Chest: normal effort, normal rate of breathing GI: soft, NT, ND, (+)BS- more normoactive Psychiatric: irritable with needing to speak with physician Neurological: slightly confused;  Musculoskeletal:     Right lower leg: No edema.     Left lower leg: No edema. Diffuse 4/5 strength with the exceptio of left lower extremity which is extremely limited by pain Assessment/Plan: 1. Functional deficits which require 3+ hours per day of interdisciplinary therapy in a comprehensive inpatient rehab setting. Physiatrist is providing close team supervision and 24 hour management of active medical problems listed below. Physiatrist and rehab team continue to assess barriers to discharge/monitor patient progress toward functional and medical goals  Care Tool:  Bathing    Body parts bathed by patient: Face, Chest, Right arm, Left arm   Body parts bathed by helper: Left lower leg, Right lower leg, Left upper leg, Right upper leg, Buttocks, Abdomen      Bathing assist Assist Level: Dependent - Patient 0%     Upper Body Dressing/Undressing Upper body dressing   What is the patient wearing?: Pull over shirt    Upper body assist Assist Level: Maximal Assistance - Patient 25 - 49%    Lower Body Dressing/Undressing Lower body dressing      What is the patient wearing?: Incontinence brief, Pants     Lower body assist Assist for lower body dressing: Dependent - Patient 0%     Toileting Toileting    Toileting assist Assist for toileting: Dependent - Patient 0%     Transfers Chair/bed transfer  Transfers assist     Chair/bed transfer assist level: Moderate Assistance - Patient 50 - 74%     Locomotion Ambulation   Ambulation assist   Ambulation activity did not occur: Safety/medical concerns          Walk 10 feet activity   Assist  Walk 10 feet activity did not occur: Safety/medical concerns        Walk 50 feet activity   Assist           Walk 150 feet activity   Assist Walk 150 feet activity did not occur: Safety/medical concerns         Walk 10 feet on uneven surface  activity   Assist Walk 10 feet on uneven surfaces activity did not occur: Safety/medical concerns         Wheelchair     Assist Is the patient using a wheelchair?: Yes Type of Wheelchair: Manual    Wheelchair assist level: Supervision/Verbal cueing Max wheelchair distance: 120 ft    Wheelchair 50 feet with 2 turns activity    Assist        Assist Level: Supervision/Verbal cueing   Wheelchair 150 feet activity     Assist      Assist Level: Minimal Assistance - Patient > 75%   Blood pressure (!) 171/98, pulse 79, temperature 98.4 F (36.9 C), temperature source Oral, resp. rate 15, height 6' 2"$  (1.88 m), weight 79.6 kg, SpO2 99 %.   Medical Problem List and Plan: 1. Functional deficits secondary to thoracic spine compression fracture             -patient may shower              -ELOS/Goals: 10-14 days Min-ModA            Continue CIR 2.  Afib: continue eliquis    3. Left buttock  back and leg pain: Continue Tylenol as needed; tramadol as needed.    2/15- will schedule Tramadol 50 mg q6 hours and cont' tylenol prn.   2/16- pt said still having pain, but wasn't able to quantify- appeared in less pain than yesterday- admitted having "same pain" which took tylenol at home for.  4. Delirium: continue Seroquel 25 mg q HS   2/15- a little confused in AM per son- will monitor and con't Seroquel for now 5. Neuropsych/cognition: This patient is capable of making decisions on his own behalf.   6. Skin/Wound Care: Routine skin care checks   7. Fluids/Electrolytes/Nutrition: Routine Is and Os and follow-up chemistries   8: Hypertension: monitor TID and prn             -continue hydralazine 10 mg TID and monitor with ambulation   2/15- BP a little elevated- but hurting more- will monitor for trend before changing meds  2/15- hydralazine was held last night- BP 123456 systolic this AM- will monitor trend- if continues, might need ot increase Bp meds.  9: Mildly elevated BUN: encourage PO liquids; follow-up BMP   2/15- Cr stable- BUN 23- slightly dry- will encourage fluids.  10: Anemia; Recent drop likely due to hematoma             2/15- New Hb 10.4- doing better  11: Constipation: will give Sorbitol tonight   2/15- smear last night- but no results so far-  Will give Sorbitol 30cc again after therapy today.   2/16- 2 Bms yesterday and 1 this AM- feeling better    LOS: 4 days A FACE TO FACE EVALUATION WAS PERFORMED  Martha Clan P Yunis Voorheis 09/10/2022, 8:28 AM

## 2022-09-10 NOTE — Progress Notes (Signed)
Occupational Therapy Session Note  Patient Details  Name: YOBANI CRONE MRN: [id number] Date of Birth: 1925/11/30  Today's Date: 09/10/2022 OT Individual Time: 1120-1210 OT Individual Time Calculation (min): 50 min    Short Term Goals: Week 1:  OT Short Term Goal 1 (Week 1): Pt will require mod A for self feeding using low vision and AE with cues OT Short Term Goal 2 (Week 1): Pt will require mod A simple grooming seated sink side with cues OT Short Term Goal 3 (Week 1): Pt will require min a for UB bathing and pullover shirt seated OT Short Term Goal 4 (Week 1): Pt will perform sit to stand in prep for LB self care with mod A and LRAD  Skilled Therapeutic Interventions/Progress Updates:  Pt greeted seated in w/c, pt agreeable to OT intervention.pt with difficulty getting BLEs on standard leg rests, retrieved elevating leg rests with improved ability to manage BLEs and for pain mgmt. Adjsuted TLSO as brace was riding up. Pt able to state that he was at [facility] d/t falls, but continued to refer to ALF. Pt also seeing things that weren't' present such as dogs on the floor or thinking he was dropping items.   Total A transport to gym where session focused on functional sit>stands with pt positioned at parallel bars. Pt with difficulty getting RLE underneath him enough to stand d/t pain in RLE. Pt completed x4 trials with MAX A however pt unable to get hips forward enough to power into full stand or maneuver R hand from w/c to parallel bar. Called for +2 assist however no one available.      Transported pt back to room with pt reporting need to void b/b, pt required +2 assist to stand to stedy for toilet transfer. Pt thinking he was falling during transfer needing max cues to orient pt to being on stedy. Unable to void b/b.          Ended session with pt seated in w/c with all needs within reach and safety belt alarm activated.                    Therapy Documentation Precautions:   Precautions Precautions: Fall, Back Precaution Booklet Issued: Yes (comment) Precaution Comments: reviewed back precautions with pt, Required Braces or Orthoses: Spinal Brace Spinal Brace: Thoracolumbosacral orthotic, Applied in sitting position Restrictions Weight Bearing Restrictions: No   Pain: unrated pain in RLE, rest breaks and repositioning provided as needed.     Therapy/Group: Individual Therapy  Corinne Ports [facility] 09/10/2022, 12:30 PM

## 2022-09-10 NOTE — Progress Notes (Signed)
Occupational Therapy Session Note  Patient Details  Name: Justin Potter MRN: [id number] Date of Birth: 28-Sep-1925  Today's Date: 09/10/2022 OT Individual Time: 1500-1530 OT Individual Time Calculation (min): 30 min    Short Term Goals: Week 1:  OT Short Term Goal 1 (Week 1): Pt will require mod A for self feeding using low vision and AE with cues OT Short Term Goal 2 (Week 1): Pt will require mod A simple grooming seated sink side with cues OT Short Term Goal 3 (Week 1): Pt will require min a for UB bathing and pullover shirt seated OT Short Term Goal 4 (Week 1): Pt will perform sit to stand in prep for LB self care with mod A and LRAD  Skilled Therapeutic Interventions/Progress Updates:  Pt received sitting in [facility] for skilled OT session with focus on functional transfers and toileting. Pt with un-rated pain in knees. OT offering intermediate rest breaks and positioning suggestions throughout session to address pain/fatigue and maximize participation/safety in session.   Pt disoriented and confused throughout session with difficulty following simple 1-step directions for use of stedy, such as "lift your leg" or "slide your foot back." Pt with urgency, dependent for clothing management and urinal positioning, but unable to void.   Pt ultimately requires +3 assistance and heavy multimodal cuing to reach semi-stance of stedy for transfer to bed. Seated EOB, pt requires A for log roll and BLE elevation. Pt able to boost self in bed with use of bed features, pulling self upwards with cuing.   Pt remained resting in bed with all immediate needs met at end of session. Pt continues to be appropriate for skilled OT intervention to promote further functional independence.   Therapy Documentation Precautions:  Precautions Precautions: Fall, Back Precaution Booklet Issued: Yes (comment) Precaution Comments: reviewed back precautions with pt, Required Braces or Orthoses: Spinal Brace Spinal Brace:  Thoracolumbosacral orthotic, Applied in sitting position Restrictions Weight Bearing Restrictions: No  Therapy/Group: Individual Therapy  Maudie Mercury, OTR/L, MSOT  09/10/2022, 6:15 AM

## 2022-09-10 NOTE — Progress Notes (Signed)
Pt agitated, upset and yelling. New order for agitation given. PT refused medications. Nurse was able to get tramadol to pt but seroquel was not able to be administered orally due to pt refusal to take. Medication is in applesauce, will try to give again soon. Family and MD notified of behavior.

## 2022-09-11 ENCOUNTER — Ambulatory Visit: Payer: Medicare Other | Admitting: Podiatry

## 2022-09-11 DIAGNOSIS — I10 Essential (primary) hypertension: Secondary | ICD-10-CM

## 2022-09-11 DIAGNOSIS — R7989 Other specified abnormal findings of blood chemistry: Secondary | ICD-10-CM

## 2022-09-11 LAB — URINALYSIS, W/ REFLEX TO CULTURE (INFECTION SUSPECTED)
Glucose, UA: NEGATIVE mg/dL
Hgb urine dipstick: NEGATIVE
Ketones, ur: 40 mg/dL — AB
Nitrite: NEGATIVE
Protein, ur: 30 mg/dL — AB
Specific Gravity, Urine: 1.015 (ref 1.005–1.030)
pH: 8.5 — ABNORMAL HIGH (ref 5.0–8.0)

## 2022-09-11 LAB — BASIC METABOLIC PANEL
Anion gap: 11 (ref 5–15)
BUN: 30 mg/dL — ABNORMAL HIGH (ref 8–23)
CO2: 24 mmol/L (ref 22–32)
Calcium: 8.9 mg/dL (ref 8.9–10.3)
Chloride: 103 mmol/L (ref 98–111)
Creatinine, Ser: 0.89 mg/dL (ref 0.61–1.24)
GFR, Estimated: >60 mL/min (ref >60)
Glucose, Bld: 106 mg/dL — ABNORMAL HIGH (ref 70–99)
Potassium: 3.4 mmol/L — ABNORMAL LOW (ref 3.5–5.1)
Sodium: 138 mmol/L (ref 135–145)

## 2022-09-11 LAB — CBC
HCT: 30.1 % — ABNORMAL LOW (ref 39.0–52.0)
Hemoglobin: 9.8 g/dL — ABNORMAL LOW (ref 13.0–17.0)
MCH: 29.6 pg (ref 26.0–34.0)
MCHC: 32.6 g/dL (ref 30.0–36.0)
MCV: 90.9 fL (ref 80.0–100.0)
Platelets: 415 10*3/uL — ABNORMAL HIGH (ref 150–400)
RBC: 3.31 MIL/uL — ABNORMAL LOW (ref 4.22–5.81)
RDW: 14.4 % (ref 11.5–15.5)
WBC: 10.8 10*3/uL — ABNORMAL HIGH (ref 4.0–10.5)
nRBC: 0 % (ref 0.0–0.2)

## 2022-09-11 MED ORDER — POTASSIUM CHLORIDE IN NACL 20-0.45 MEQ/L-% IV SOLN
INTRAVENOUS | Status: DC
  Filled 2022-09-11 (×3): qty 1000

## 2022-09-11 MED ORDER — HYDRALAZINE HCL 25 MG PO TABS
25.0000 mg | ORAL_TABLET | Freq: Three times a day (TID) | ORAL | Status: DC
  Administered 2022-09-11 – 2022-09-18 (×19): 25 mg via ORAL
  Filled 2022-09-11 (×20): qty 1

## 2022-09-11 NOTE — Discharge Summary (Incomplete)
Physician Discharge Summary  Patient ID: Justin Potter MRN: [id number] DOB/AGE: [birth year] 87 y.o.  Admit date: 09/06/2022 Discharge date: 09/18/2022  Discharge Diagnoses:  Principal Problem:   Compression fracture of body of thoracic vertebra ([place]) Debility secondary to T spine fracture Left buttock pain Delirium Hypertension Anemia Constipation Atrial fibrillation Right gluteus maximus hematoma Posterior scalp hematoma UTI due to Proteus mirabilis Anorexia   Discharged Condition: stable  Labs:  Basic Metabolic Panel: Recent Labs  Lab 09/12/22 0515 09/14/22 0707 09/15/22 0713 09/16/22 0557 09/17/22 1447 09/18/22 0706  NA 141 141 140 142 139 141  K 3.5 3.5 3.5 3.7 3.9 4.0  CL 105 108 109 111 108 111  CO2 23 22 21* 20* 19* 20*  GLUCOSE 104* 103* 105* 96 95 92  BUN 24* 30* 24* '20 13 14  '$ CREATININE 0.87 0.88 0.86 0.83 0.72 0.71  CALCIUM 8.9 8.8* 8.6* 8.6* 8.5* 8.5*    CBC: Recent Labs  Lab 09/14/22 0707 09/17/22 1447 09/18/22 0706  WBC 9.3 9.5 10.0  NEUTROABS  --  7.5  --   HGB 10.0* 10.4* 10.4*  HCT 31.4* 32.2* 32.6*  MCV 92.4 91.2 92.9  PLT 411* 411* 385   Brief HPI:   Justin Potter is a 89 y.o. male who resides in [place] who was brought to the emergency department on 08/30/2022 after multiple falls and was found to have acute appearing compression fracture of the T6 vertebra and acute nondisplaced cortical fracture of the anterior aspect of the superior endplate of T3.  Nondisplaced spinal fracture.  He has a history of atrial fibrillation maintained on Eliquis.  CT scan of the head showed multiple small left posterior scalp hematomas and CT scan of the right hip revealed intramuscular hematoma of the gluteus maximus and soft tissue contusion.  He was treated conservatively with T LSO brace and analgesics.  Eliquis was temporarily held started on 2/10.  Blood work consistent with acute blood loss anemia and is stable and improving.  Here is requiring  plus to mod assist for sit to stand from bed.  He is tolerating regular diet.The patient requires inpatient physical medicine and rehabilitation evaluations and treatment secondary to dysfunction due to thoracic spine fracture. Complains of left sided buttock pain.    Hospital Course: Justin Potter was admitted to rehab 09/06/2022 for inpatient therapies to consist of PT, ST and OT at least three hours five days a week. Past admission physiatrist, therapy team and rehab RN have worked together to provide customized collaborative inpatient rehab. Follow-up hemoglobin level improved. Creatinine stable. Sleepless night 2/19 with confusion and agitation. Medicated with IM Ativan after patient refused Seroquel and lethargic afterward. Encourage to take Seroquel as scheduled. Tramadol dose reduced to 25 mg and UA with culture obtained. Start sleep chart. Follow-up labs on 2/19 with BUN now 30 with stable creatinine. IVFs started>>NS at 50 cc/hr. CBC stable with slight increase in platelet count, WBC. Rocephin started for Proteus UTI. Increase in confusion and delirium.  Changed to 15/7 therapies on 2/20. Tramadol discontinued. IVFs increased. BUN down to 24 on 2/23. Continuous IVFs. Discontinued trazodone 2/23. Remains confused, but pleasant 2/25. IVF continued for poor oral intake.  Blood pressures were monitored on TID basis and hydralazine 10 mg TID continued. Increased to 25 mg TID due to elevated BP on 2/19. Hydralazine increased to 25 mg TID on 2/21.   Rehab course: During patient's stay in rehab weekly team conferences were held to monitor patient's progress, set  goals and discuss barriers to discharge. At admission, patient required total assist with basic self-care skills and max assist with mobility.  Discharge disposition: 03-Skilled Nursing Facility     Diet: Regular; continue IVFs  Special Instructions: No driving, alcohol consumption or tobacco use.  30-35 minutes were spent on discharge  planning and discharge summary.  Discharge Instructions     Discharge patient   Complete by: As directed    Discharge disposition: 03-Skilled Nursing Facility   Discharge patient date: 09/18/2022      Allergies as of 09/18/2022   No Known Allergies      Medication List     STOP taking these medications    acetaminophen 325 MG tablet Commonly known as: TYLENOL   acetaminophen 500 MG tablet Commonly known as: TYLENOL   Flintstones Complete Chew   ICAPS AREDS 2 PO   traMADol 50 MG tablet Commonly known as: ULTRAM       TAKE these medications    0.45 % NaCl with KCl 20 mEq / L 20-0.45 MEQ/L-% Inject 50 mL/hr into the vein continuous.   Eliquis 5 MG Tabs tablet Generic drug: apixaban Take 1 tablet (5 mg total) by mouth 2 (two) times daily.   hydrALAZINE 25 MG tablet Commonly known as: APRESOLINE Take 1 tablet (25 mg total) by mouth every 8 (eight) hours.   lidocaine 5 % Commonly known as: LIDODERM Place 2 patches onto the skin daily. Remove & Discard patch within 12 hours or as directed by MD   QUEtiapine 25 MG tablet Commonly known as: SEROQUEL Take 0.5 tablets (12.5 mg total) by mouth daily as needed (daily as needed for agitation). What changed:  how much to take when to take this reasons to take this   QUEtiapine 50 MG tablet Commonly known as: SEROQUEL Take 1 tablet (50 mg total) by mouth at bedtime. What changed: You were already taking a medication with the same name, and this prescription was added. Make sure you understand how and when to take each.   senna-docusate 8.6-50 MG tablet Commonly known as: Senokot-S Take 1 tablet by mouth daily at 8 pm.        Contact information for follow-up providers     [name], MD Follow up.   Specialty: Internal Medicine Contact information: [address] [place] [address] [place], [place] [facility] Follow up.   Specialty:  Neurosurgery Contact information: [place] [address] [phone]         Courtney Heys, MD Follow up.   Specialty: Physical Medicine and Rehabilitation Why: As needed Contact information: [id number] [place] [address] [phone]              Contact information for after-discharge care     Destination     [facility], [place] Preferred SNF .   Service: Skilled Nursing Contact information: [place] [phone]                     Signed: Barbie Banner 09/18/2022, 11:12 AM

## 2022-09-11 NOTE — Progress Notes (Signed)
Occupational Therapy Note  Patient Details  Name: Justin Potter MRN: [id number] Date of Birth: 12-18-1925  Today's Date: 09/11/2022 OT Missed Time: 75 Minutes Missed Time Reason: Patient fatigue;Other (comment)  Pt sleeping upon arrival. Attempted to arouse pt to participate in scheduled therapy session. Pt not oriented and continues to exhibit confusion. Unable to arouse pt adequately enough to follow commands and participate in therapy. Pt missed 75 mins skilled OT services. Will attempt to make up time as schedule allows.   Leotis Shames [facility] 09/11/2022, 11:12 AM

## 2022-09-11 NOTE — Progress Notes (Signed)
PROGRESS NOTE   Subjective/Complaints: Pt up all night with confusion and agitation. Now lethargic this morning. IM ativan was given last night. Apparently refused seroquel.  ROS: Limited due to cognitive/behavioral    Objective:   No results found. Recent Labs    09/11/22 0534  WBC 10.8*  HGB 9.8*  HCT 30.1*  PLT 415*    Recent Labs    09/11/22 0534  NA 138  K 3.4*  CL 103  CO2 24  GLUCOSE 106*  BUN 30*  CREATININE 0.89  CALCIUM 8.9     Intake/Output Summary (Last 24 hours) at 09/11/2022 0850 Last data filed at 09/11/2022 0700 Gross per 24 hour  Intake 120 ml  Output 100 ml  Net 20 ml         Physical Exam: Vital Signs Blood pressure (!) 163/79, pulse 86, temperature (!) 97.5 F (36.4 C), temperature source Oral, resp. rate 18, height 6' 2"$  (1.88 m), weight 79.6 kg, SpO2 92 %.  Constitutional: No distress . Vital signs reviewed. HEENT: NCAT, EOMI, oral membranes moist Neck: supple Cardiovascular: RRR without murmur. No JVD    Respiratory/Chest: CTA Bilaterally without wheezes or rales. Normal effort    GI/Abdomen: BS +, non-tender, non-distended Ext: no clubbing, cyanosis, or edema Psych: flat, confused  Neurological: lethargic. Follows some 1 step commands. Both legs tight in extension. ?resisting ROM. DTR's 1+. No clonus.  Musculoskeletal:     Right lower leg: No edema.     Left lower leg: No edema. Diffuse 4/5 strength with the except left lower extremity which is extremely limited by pain   Assessment/Plan: 1. Functional deficits which require 3+ hours per day of interdisciplinary therapy in a comprehensive inpatient rehab setting. Physiatrist is providing close team supervision and 24 hour management of active medical problems listed below. Physiatrist and rehab team continue to assess barriers to discharge/monitor patient progress toward functional and medical goals  Care  Tool:  Bathing    Body parts bathed by patient: Face, Chest, Right arm, Left arm   Body parts bathed by helper: Left lower leg, Right lower leg, Left upper leg, Right upper leg, Buttocks, Abdomen     Bathing assist Assist Level: Dependent - Patient 0%     Upper Body Dressing/Undressing Upper body dressing   What is the patient wearing?: Pull over shirt    Upper body assist Assist Level: Maximal Assistance - Patient 25 - 49%    Lower Body Dressing/Undressing Lower body dressing      What is the patient wearing?: Incontinence brief, Pants     Lower body assist Assist for lower body dressing: Dependent - Patient 0%     Toileting Toileting    Toileting assist Assist for toileting: Dependent - Patient 0%     Transfers Chair/bed transfer  Transfers assist     Chair/bed transfer assist level: Maximal Assistance - Patient 25 - 49%     Locomotion Ambulation   Ambulation assist   Ambulation activity did not occur: Safety/medical concerns          Walk 10 feet activity   Assist  Walk 10 feet activity did not occur: Safety/medical concerns  Walk 50 feet activity   Assist           Walk 150 feet activity   Assist Walk 150 feet activity did not occur: Safety/medical concerns         Walk 10 feet on uneven surface  activity   Assist Walk 10 feet on uneven surfaces activity did not occur: Safety/medical concerns         Wheelchair     Assist Is the patient using a wheelchair?: Yes Type of Wheelchair: Manual    Wheelchair assist level: Supervision/Verbal cueing Max wheelchair distance: 120 ft    Wheelchair 50 feet with 2 turns activity    Assist        Assist Level: Supervision/Verbal cueing   Wheelchair 150 feet activity     Assist      Assist Level: Minimal Assistance - Patient > 75%   Blood pressure (!) 163/79, pulse 86, temperature (!) 97.5 F (36.4 C), temperature source Oral, resp. rate 18,  height 6' 2"$  (1.88 m), weight 79.6 kg, SpO2 92 %.   Medical Problem List and Plan: 1. Functional deficits secondary to thoracic spine compression fracture             -patient may shower             -ELOS/Goals: 10-14 days Min-ModA            -Continue CIR therapies including PT, OT  2.  Afib: continue eliquis    3. Left buttock  back and leg pain: Continue Tylenol as needed; tramadol as needed.    2/15- will schedule Tramadol 50 mg q6 hours and cont' tylenol prn.   2/19 careful with pain meds d/t confusion/delirium.    -reduce tramadol to 1m 4. Delirium: continue Seroquel 25 mg q HS   2/19 remains quite confused. Agitation worse at night.   -needs to take scheduled seroquel.   -reduce tramadol  -check UA, UCX  -sleep chart 5. Neuropsych/cognition: This patient is not capable of making decisions on his own behalf.   6. Skin/Wound Care: Routine skin care checks   7. Fluids/Electrolytes/Nutrition:     -2/19 mild hypokalemia: add K+ to IVF  -recheck labs tomorrow 8: Hypertension: monitor TID and prn             -continue hydralazine 10 mg TID and monitor with ambulation   2/15- BP a little elevated- but hurting more- will monitor for trend before changing meds  2/19--adjust hydralazine to 236mtid 9: Prerenal azotemia:      2/19 BUN up to 30. Begin IVF today 1/2ns at 50cc hr.  -recheck labs tomorrow 10: Anemia; Recent drop likely due to hematoma             2/19 hgb 9.8  11: Constipation: will give Sorbitol tonight   -LBM 2/18   LOS: 5 days A FACE TO FACE EVALUATION WAS PERFORMED  ZaMeredith Staggers/19/2024, 8:50 AM

## 2022-09-11 NOTE — Progress Notes (Signed)
Pt was up all night with confusion and agitation trying to get out of bed. Compliant with some hs and am medications. Prn IM ativan given last night but ineffective.

## 2022-09-11 NOTE — Progress Notes (Signed)
Physical Therapy Session Note  Patient Details  Name: Justin Potter MRN: [id number] Date of Birth: [birth year]  Today's Date: 09/11/2022 PT Individual Time: [id number] PT Individual Time Calculation (min): 45 min   Short Term Goals: Week 1:  PT Short Term Goal 1 (Week 1): Pt will stand with +1 assist PT Short Term Goal 2 (Week 1): Pt will perform squat pivot transfer with mod A PT Short Term Goal 3 (Week 1): Pt will initiate standing transfer training  Skilled Therapeutic Interventions/Progress Updates:    Pt recd in bed, sleeping and lethargic. Pt roused to verbal stimuli, but displayed moderate to severe lethargy and confusion. Pt stated he was at [place] but accepted reorientation to hospital environment without agitation. Donned ted hose tot A and threaded pants tot A. Pt intermittently able to follow directions, but largely unable to follow directions to roll or perform bridge at this time. Tot A for repositioning in bed with +2 assist. Pt intermittently c/o feeling like he was falling and becoming anxious, reaching out for bed rail, as well as calling out for his wife. Attempted to help pt wash face, but tot A as pt could not follow directions or keep eyes open at this time. MD in/out for morning rounds. Discussed lethargy and confusion, feeling of falling, and tone vs general stiffness from immobility and difficulty following directions. Therapist also consulted with pt's nurse about pt condition, medications taken, and pt's skin. Pt noted to have largely symmetrical wounds on the end of each great toe and mild abrasion on BLE, which appear older. Reported to nsg. Pt remained in bed with 4 rails up per pt request d/t feeling of falling, pt was left with all needs in reach and alarm active.   Therapy Documentation Precautions:  Precautions Precautions: Fall, Back Precaution Booklet Issued: Yes (comment) Precaution Comments: reviewed back precautions with pt, Required Braces or  Orthoses: Spinal Brace Spinal Brace: Thoracolumbosacral orthotic, Applied in sitting position Restrictions Weight Bearing Restrictions: No General:       Therapy/Group: Individual Therapy  Mickel Fuchs 09/11/2022, 8:46 AM

## 2022-09-11 NOTE — Discharge Instructions (Addendum)
Inpatient Rehab Discharge Instructions  Justin Potter Discharge date and time:  09/18/2022  Activities/Precautions/ Functional Status: Activity: no lifting, driving, or strenuous exercise until cleared by MD Diet: regular diet Wound Care: none needed Functional status:  ___ No restrictions     ___ Walk up steps independently _x__ 24/7 supervision/assistance   ___ Walk up steps with assistance ___ Intermittent supervision/assistance  ___ Bathe/dress independently ___ Walk with walker     ___ Bathe/dress with assistance ___ Walk Independently    ___ Shower independently ___ Walk with assistance    _x__ Shower with assistance __x_ No alcohol     ___ Return to work/school ________  Special Instructions: No driving, alcohol consumption or tobacco use.  Wear TLSO brace until follow-up with neurosurgery.  My questions have been answered and I understand these instructions. I will adhere to these goals and the provided educational materials after my discharge from the hospital.  Patient/Caregiver Signature _______________________________ Date __________  Clinician Signature _______________________________________ Date __________  Please bring this form and your medication list with you to all your follow-up doctor's appointments.

## 2022-09-11 NOTE — Progress Notes (Signed)
Physical Therapy Session Note  Patient Details  Name: Justin Potter MRN: [id number] Date of Birth: 1926-03-12  Today's Date: 09/11/2022  Pt presents asleep in bed with minimal groaning in response to verbal and tactile stimulation. Offered patient to get up to eat lunch, attempted to awaken via touch, but pt groaned "maybe later" while keeping eyes closed and going back to sleep despite several attempts. Will follow up as able.   General: PT Amount of Missed Time (min): 60 Minutes PT Missed Treatment Reason: Patient fatigue (lethargy)    Canary Brim Ivory Broad, PT, DPT, CBIS  09/11/2022, 2:15 PM

## 2022-09-12 LAB — BASIC METABOLIC PANEL
Anion gap: 13 (ref 5–15)
BUN: 24 mg/dL — ABNORMAL HIGH (ref 8–23)
CO2: 23 mmol/L (ref 22–32)
Calcium: 8.9 mg/dL (ref 8.9–10.3)
Chloride: 105 mmol/L (ref 98–111)
Creatinine, Ser: 0.87 mg/dL (ref 0.61–1.24)
GFR, Estimated: >60 mL/min (ref >60)
Glucose, Bld: 104 mg/dL — ABNORMAL HIGH (ref 70–99)
Potassium: 3.5 mmol/L (ref 3.5–5.1)
Sodium: 141 mmol/L (ref 135–145)

## 2022-09-12 MED ORDER — SODIUM CHLORIDE 0.9 % IV SOLN
1.0000 g | INTRAVENOUS | Status: AC
  Administered 2022-09-12 – 2022-09-18 (×7): 1 g via INTRAVENOUS
  Filled 2022-09-12 (×7): qty 10

## 2022-09-12 MED ORDER — TRAMADOL HCL 50 MG PO TABS: 25.0000 mg | ORAL_TABLET | Freq: Four times a day (QID) | ORAL | Status: DC

## 2022-09-12 MED ORDER — POTASSIUM CHLORIDE IN NACL 20-0.45 MEQ/L-% IV SOLN
INTRAVENOUS | Status: DC
  Administered 2022-09-12: 600 mL via INTRAVENOUS
  Filled 2022-09-12 (×2): qty 1000

## 2022-09-12 NOTE — Progress Notes (Signed)
Occupational Therapy Session Note  Patient Details  Name: Justin Potter MRN: [id number] Date of Birth: [birth year]  Today's Date: 09/12/2022 OT Individual Time: 1045-1130 OT Individual Time Calculation (min): 45 min    Short Term Goals: Week 1:  OT Short Term Goal 1 (Week 1): Pt will require mod A for self feeding using low vision and AE with cues OT Short Term Goal 1 - Progress (Week 1): Progressing toward goal OT Short Term Goal 2 (Week 1): Pt will require mod A simple grooming seated sink side with cues OT Short Term Goal 2 - Progress (Week 1): Progressing toward goal OT Short Term Goal 3 (Week 1): Pt will require min a for UB bathing and pullover shirt seated OT Short Term Goal 3 - Progress (Week 1): Progressing toward goal OT Short Term Goal 4 (Week 1): Pt will perform sit to stand in prep for LB self care with mod A and LRAD OT Short Term Goal 4 - Progress (Week 1): Progressing toward goal  Skilled Therapeutic Interventions/Progress Updates:   Pt seen for skilled OT session with focus on simple self care mngt and functional mobility oob. Pt was seen earlier by OT and had limited performance for self care. OT arrival and pt had voided into incontinence brief and needed changing. Pants at knees due to previous OT unable to arouse pt to participate for task. Pt more alert this session was able to roll for brief change and peri cleaning with D level x 1 helper. Pt then able to move supine to sit with max A x 1 helper. Once EOB, sat for 5 minutes to allow OT and NT to set up [employer]  with close S. Transferred after explaining new lift to pt with max-D sit to stand in lift for bed to w/c transfer. Pt able to sit up in w/c with fairly upright posture. Left pt bedside in w/c with chair alarm active, adapted pad nurse call button in reach and needs on tray table.   Therapy Documentation Precautions:  Precautions Precautions: Fall, Back Precaution Booklet Issued: Yes (comment) Precaution  Comments: reviewed back precautions with pt, Required Braces or Orthoses: Spinal Brace Spinal Brace: Thoracolumbosacral orthotic, Applied in sitting position Restrictions Weight Bearing Restrictions: No    Therapy/Group: Individual Therapy  Barnabas Lister 09/12/2022, 7:33 AM

## 2022-09-12 NOTE — Progress Notes (Signed)
Physical Therapy Note  Patient Details  Name: KRISHAUN NOOR MRN: [id number] Date of Birth: Oct 04, 1925 Today's Date: 09/12/2022    Pt's plan of care adjusted to 15/7 after speaking with care team and discussed with MD in team conference as pt currently unable to tolerate current therapy schedule with OT, PT, and SLP.     Mickel Fuchs 09/12/2022, 12:37 PM

## 2022-09-12 NOTE — Progress Notes (Signed)
Occupational Therapy Weekly Progress Note  Patient Details  Name: Justin Potter MRN: [id number] Date of Birth: 1926/04/16  Beginning of progress report period: September 07, 2022 End of progress report period: September 12, 2022  Patient has met 0 of 4 short term goals.  Pt progress has been slow and inconsistent. Pt consistently requires max verbal cues for orientation to place and situation. Pt requires max verbal cues for task initiation and back precautions. Transfer assistance varies from mod A for squat pivot to use of Stedy. Pt currently requires mod A to max A for BADLs.   Patient continues to demonstrate the following deficits: muscle weakness and muscle joint tightness, decreased cardiorespiratoy endurance, decreased visual acuity, decreased awareness, decreased safety awareness, decreased memory, and delayed processing, and decreased standing balance, decreased postural control, and decreased balance strategies and therefore will continue to benefit from skilled OT intervention to enhance overall performance with BADL.  Patient not progressing toward long term goals.  See goal revision..  Plan of care revisions: OT LTGs downgraded 02/21.  OT Short Term Goals Week 1:  OT Short Term Goal 1 (Week 1): Pt will require mod A for self feeding using low vision and AE with cues OT Short Term Goal 1 - Progress (Week 1): Progressing toward goal OT Short Term Goal 2 (Week 1): Pt will require mod A simple grooming seated sink side with cues OT Short Term Goal 2 - Progress (Week 1): Progressing toward goal OT Short Term Goal 3 (Week 1): Pt will require min a for UB bathing and pullover shirt seated OT Short Term Goal 3 - Progress (Week 1): Progressing toward goal OT Short Term Goal 4 (Week 1): Pt will perform sit to stand in prep for LB self care with mod A and LRAD OT Short Term Goal 4 - Progress (Week 1): Progressing toward goal Week 2:  OT Short Term Goal 1 (Week 2): Pt will require mod A for  self feeding using low vision and AE with cues OT Short Term Goal 2 (Week 2): Pt will require min a for UB bathing and pullover shirt seated OT Short Term Goal 3 (Week 2): Pt will perform sit to stand in prep for LB self care with mod A and LRAD OT Short Term Goal 4 (Week 2): Pt will perform sit to stand in prep for LB self care with mod A and LRAD    Leroy Libman  [facility], OTR/L, MSOT  09/12/2022, 6:49 AM

## 2022-09-12 NOTE — Progress Notes (Signed)
Patient ID: Justin Potter, male   DOB: 1926-05-15, 89 y.o.   MRN: [id number]  [id number] spoke with pt son [name] to give updates from team conference, and inform on ELOS 2-3 weeks. SW will follow-up with updates once there is more information on if he should consider pursing SNF placement.   Loralee Pacas, MSW, [place] Office: [phone] Cell: [phone] Fax: [phone]

## 2022-09-12 NOTE — Progress Notes (Signed)
Physical Therapy Session Note  Patient Details  Name: Justin Potter MRN: [id number] Date of Birth: [birth year]  Today's Date: 09/12/2022 PT Individual Time: 1130-1200 PT Individual Time Calculation (min): 30 min   Short Term Goals: Week 1:  PT Short Term Goal 1 (Week 1): Pt will stand with +1 assist PT Short Term Goal 2 (Week 1): Pt will perform squat pivot transfer with mod A PT Short Term Goal 3 (Week 1): Pt will initiate standing transfer training  Skilled Therapeutic Interventions/Progress Updates:    Pt seated in w/c on arrival and agreeable to therapy. Pt reports pain in his BLE with mobility, unsure if true pain vs muscle activation which pt is not used to, pt unable to clarify. premedicated. Rest and positioning provided as needed. Pt performed LAQ, seated marching, and isometric abduction/adduction against manual resistance. Pt noted to bed stronger L>R, but limited by stiffness in all BLE joints. Pt took sip of water with therapist present, and demoed coughing and wet vocal quality requiring repeated swallows to clear. Discussed with nsg, as pt does not have speech currently, with plan to check in during lunch. Pt remained seated in w/c at end of session and was left with all needs in reach and alarm active.   Therapy Documentation Precautions:  Precautions Precautions: Fall, Back Precaution Booklet Issued: Yes (comment) Precaution Comments: reviewed back precautions with pt, Required Braces or Orthoses: Spinal Brace Spinal Brace: Thoracolumbosacral orthotic, Applied in sitting position Restrictions Weight Bearing Restrictions: No General:       Therapy/Group: Individual Therapy  Mickel Fuchs 09/12/2022, 12:38 PM

## 2022-09-12 NOTE — Progress Notes (Signed)
Occupational Therapy Session Note  Patient Details  Name: Justin Potter MRN: [id number] Date of Birth: Nov 20, 1925  Today's Date: 09/12/2022 OT Individual Time: [id number] Individual Time Calculation (min): 45 min  and Today's Date: 09/12/2022 OT Missed Time: 15 Minutes Missed Time Reason: Patient fatigue;Other (comment) (lethargy)   Short Term Goals: Week 2:  OT Short Term Goal 1 (Week 2): Pt will require mod A for self feeding using low vision and AE with cues OT Short Term Goal 2 (Week 2): Pt will require min a for UB bathing and pullover shirt seated OT Short Term Goal 3 (Week 2): Pt will perform sit to stand in prep for LB self care with mod A and LRAD OT Short Term Goal 4 (Week 2): Pt will perform sit to stand in prep for LB self care with mod A and LRAD  Skilled Therapeutic Interventions/Progress Updates:    Pt sleeping upon arrival but easily aroused this morning. Pt oriented to hospital this morning but unable to recall which hospital. Once oriented, pt recall that [facility] is in [place]. Pt unable to recall why in hospital. Tot A+2 with max verbal cues for rolling in bed to change brief. Pt with periodic times of sleeping but easily aroused. Pt with limited BLE knee flexion. OT intervention with focus on bed mobility and following one step commands. Pt requires multimodal cues to follow commands. Pt remained in bed with all needs within reach. Bed alarm activated. Pt sleeping.   Therapy Documentation Precautions:  Precautions Precautions: Fall, Back Precaution Booklet Issued: Yes (comment) Precaution Comments: reviewed back precautions with pt, Required Braces or Orthoses: Spinal Brace Spinal Brace: Thoracolumbosacral orthotic, Applied in sitting position Restrictions Weight Bearing Restrictions: No General: General OT Amount of Missed Time: 15 Minutes   Pain:  Pt c/o of discomfort when washing face, touching Bil big toes, and with BLE knee flexion;  repositioned   Therapy/Group: Individual Therapy  Leroy Libman 09/12/2022, 9:51 AM

## 2022-09-12 NOTE — Progress Notes (Signed)
PROGRESS NOTE   Subjective/Complaints: Pt had a better night. Took seroquel. Did sleep. More alert today but doesn't appear back to baseline.  ROS: Limited due to cognitive/behavioral    Objective:   No results found. Recent Labs    09/11/22 0534  WBC 10.8*  HGB 9.8*  HCT 30.1*  PLT 415*    Recent Labs    09/11/22 0534 09/12/22 0515  NA 138 141  K 3.4* 3.5  CL 103 105  CO2 24 23  GLUCOSE 106* 104*  BUN 30* 24*  CREATININE 0.89 0.87  CALCIUM 8.9 8.9     Intake/Output Summary (Last 24 hours) at 09/12/2022 0844 Last data filed at 09/12/2022 0824 Gross per 24 hour  Intake 118 ml  Output 400 ml  Net -282 ml         Physical Exam: Vital Signs Blood pressure (!) 170/58, pulse 85, temperature 97.7 F (36.5 C), resp. rate 18, height 6' 2"$  (1.88 m), weight 79.6 kg, SpO2 98 %.  Constitutional: No distress . Vital signs reviewed. HEENT: NCAT, EOMI, oral membranes moist Neck: supple Cardiovascular: RRR without murmur. No JVD    Respiratory/Chest: CTA Bilaterally without wheezes or rales. Normal effort    GI/Abdomen: BS +, non-tender, non-distended Ext: no clubbing, cyanosis, or edema Psych: still flat, less confused  Neurological: lethargic. Follows some 1 step commands. Both legs still somewhat tight in extension. ?resisting ROM. DTR's 1+. No clonus.  Musculoskeletal:     Right lower leg: No edema.     Left lower leg: No edema.     Back TTP/ROM   Assessment/Plan: 1. Functional deficits which require 3+ hours per day of interdisciplinary therapy in a comprehensive inpatient rehab setting. Physiatrist is providing close team supervision and 24 hour management of active medical problems listed below. Physiatrist and rehab team continue to assess barriers to discharge/monitor patient progress toward functional and medical goals  Care Tool:  Bathing    Body parts bathed by patient: Face, Chest, Right  arm, Left arm   Body parts bathed by helper: Left lower leg, Right lower leg, Left upper leg, Right upper leg, Buttocks, Abdomen     Bathing assist Assist Level: Dependent - Patient 0%     Upper Body Dressing/Undressing Upper body dressing   What is the patient wearing?: Pull over shirt    Upper body assist Assist Level: Maximal Assistance - Patient 25 - 49%    Lower Body Dressing/Undressing Lower body dressing      What is the patient wearing?: Incontinence brief, Pants     Lower body assist Assist for lower body dressing: Dependent - Patient 0%     Toileting Toileting    Toileting assist Assist for toileting: Dependent - Patient 0%     Transfers Chair/bed transfer  Transfers assist     Chair/bed transfer assist level: Maximal Assistance - Patient 25 - 49%     Locomotion Ambulation   Ambulation assist   Ambulation activity did not occur: Safety/medical concerns          Walk 10 feet activity   Assist  Walk 10 feet activity did not occur: Safety/medical concerns  Walk 50 feet activity   Assist           Walk 150 feet activity   Assist Walk 150 feet activity did not occur: Safety/medical concerns         Walk 10 feet on uneven surface  activity   Assist Walk 10 feet on uneven surfaces activity did not occur: Safety/medical concerns         Wheelchair     Assist Is the patient using a wheelchair?: Yes Type of Wheelchair: Manual    Wheelchair assist level: Supervision/Verbal cueing Max wheelchair distance: 120 ft    Wheelchair 50 feet with 2 turns activity    Assist        Assist Level: Supervision/Verbal cueing   Wheelchair 150 feet activity     Assist      Assist Level: Minimal Assistance - Patient > 75%   Blood pressure (!) 170/58, pulse 85, temperature 97.7 F (36.5 C), resp. rate 18, height 6' 2"$  (1.88 m), weight 79.6 kg, SpO2 98 %.   Medical Problem List and Plan: 1. Functional  deficits secondary to thoracic spine compression fracture             -patient may shower             -ELOS/Goals: 10-14 days Min-ModA            -Continue CIR therapies including PT and OT. Interdisciplinary team conference today to discuss goals, barriers to discharge, and dc planning.  2.  Afib: continue eliquis    3. Left buttock  back and leg pain: Continue Tylenol as needed; tramadol as needed.    2/15- will schedule Tramadol 50 mg q6 hours and cont' tylenol prn.   2/19 careful with pain meds d/t confusion/delirium.    -reduce tramadol to 23m 4. Delirium: continue Seroquel 25 mg q HS   2/20 appears more alert/appropriate today. Still confused.   -took scheduled seroquel last night   -reduce tramadol to 247mtid  -UA +, empiric ceftriaxone started  -continue sleep chart 5. Neuropsych/cognition: This patient is not capable of making decisions on his own behalf.   6. Skin/Wound Care: Routine skin care checks   7. Fluids/Electrolytes/Nutrition:     -2/19 mild hypokalemia: add K+ to IVF  -recheck labs tomorrow 8: Hypertension: monitor TID and prn             -continue hydralazine 10 mg TID and monitor with ambulation   2/15- BP a little elevated- but hurting more- will monitor for trend before changing meds  2/19--adjusted hydralazine to [id number]--[id number] today 9: Prerenal azotemia:      2/19 BUN up to 30. Begin IVF today 1/2ns at 50cc hr.  2/20 BUN improved to 24 today, continue ivf but change to HS only 10: Anemia; Recent drop likely due to hematoma             2/19 hgb 9.8  11: Constipation:     -LBM 2/18   LOS: 6 days A FACE TO FACE EVALUATION WAS PERFORMED  ZacMeredith Staggers20/2024, 8:44 AM

## 2022-09-12 NOTE — Patient Care Conference (Signed)
Inpatient RehabilitationTeam Conference and Plan of Care Update Date: 09/12/2022   Time: 11:03 AM    Patient Name: Justin Potter      Medical Record Number: [id number]  Date of Birth: Nov 22, 1925 Sex: Male         Room/Bed: 4M01C/4M01C-01 Payor Info: Payor: MEDICARE / Plan: MEDICARE PART A AND B / Product Type: *No Product type* /    Admit Date/Time:  09/06/2022  3:49 PM  Primary Diagnosis:  Compression fracture of body of thoracic vertebra [facility])  Hospital Problems: Principal Problem:   Compression fracture of body of thoracic vertebra [facility])    Expected Discharge Date: Expected Discharge Date:  (2/3 weeks)  Team Members Present: Physician leading conference: Dr. Alger Simons Social Worker Present: Loralee Pacas, [place] Nurse Present: Tacy Learn, RN PT Present: Ailene Rud, PT OT Present: Roanna Epley, COTA;Jennifer Tamala Julian, OT PPS Coordinator present : Gunnar Fusi, SLP     Current Status/Progress Goal Weekly Team Focus  Bowel/Bladder   Pt incontinent  B/B LBM 09/10/22   Will regain normal B/B pattern   Toilet q3h  on qshift    Swallow/Nutrition/ Hydration               ADL's   bathing-mod A; UB dressing (including TLSO) max A; LB dressing-mod A; transfers-mod A/max A or Stedy; ongoing confusion limiting active participation   supervision overall   activity tolerance, BADLs, orientation, standing balance    Mobility   max bed mobility, +2 STS, limited by lethargy today with minimal to no participation   min A overall  OOB tolerance and participation    Communication                Safety/Cognition/ Behavioral Observations               Pain   Pain well managed with current scheduled pain medications   Will be free from pain   Assess pt for pain qshift/prn    Skin   Bruises to right thigh/sacrum   Will maintain skin integrity with no breakdown  Assess skin qshift/prn for breakdown      Discharge Planning:  Tentative d/c plan is  to return to [facility], if he is not able to pivot for transfers, and will require heavy DME, will need SNF placement. Family is prepared for SNF placement is this is needed.   Team Discussion: Compression fracture of body of thoracic vertebra. Incontinent/continent B/B. Pain managed with PRN medications. Skin CDI. Sleep chart. TLSO brace OOB. Encourage fluids. UTI with C&S pending. Tramadol on hold for now. Patient asleep for most therapies yesterday and today. Unable to feed himself. MaxA transfers. Mod/Max for ADLs.  Patient on target to meet rehab goals: Lethargy with little to no participation.   *See Care Plan and progress notes for long and short-term goals.   Revisions to Treatment Plan:  Medications adjustments, 15/7 therapies, monitor labs  Teaching Needs: Medications, safety, self care, gait/transfer training, etc.   Current Barriers to Discharge: Decreased caregiver support, Incontinence, Lack of/limited family support, and Behavior  Possible Resolutions to Barriers: Family education, nursing education, SNF placement, order recommended DME if needed     Medical Summary Current Status: more lethargy. poor sleep. dehydrated and has uti. meds reduced. pain still a factor  Barriers to Discharge: Electrolyte abnormality;Infection/IV Antibiotics;Medical stability   Possible Resolutions to [employer] Focus: treat UTI, IVF. reducing pain meds   Continued Need for Acute Rehabilitation Level of Care: The patient requires daily medical management  by a physician with specialized training in physical medicine and rehabilitation for the following reasons: Direction of a multidisciplinary physical rehabilitation program to maximize functional independence : Yes Medical management of patient stability for increased activity during participation in an intensive rehabilitation regime.: Yes Analysis of laboratory values and/or radiology reports with any subsequent need for medication  adjustment and/or medical intervention. : Yes   I attest that I was present, lead the team conference, and concur with the assessment and plan of the team.   Ernest Pine 09/12/2022, 4:09 PM

## 2022-09-12 NOTE — Progress Notes (Signed)
Physical Therapy Session Note  Patient Details  Name: Justin Potter MRN: [id number] Date of Birth: 06-Sep-1925  Today's Date: 09/12/2022 PT Individual Time: 1418- PT Individual Time Calculation (min): 0 min   Short Term Goals: Week 1:  PT Short Term Goal 1 (Week 1): Pt will stand with +1 assist PT Short Term Goal 2 (Week 1): Pt will perform squat pivot transfer with mod A PT Short Term Goal 3 (Week 1): Pt will initiate standing transfer training  Skilled Therapeutic Interventions/Progress Updates:    Chart reviewed. Pt greeted and welcomed to PT. Pt politely declined and stated he felt tired and weak. Pt also talking to persons not in room and demonstrating confusion in speech. PT encouraged pt to participate and attempted to initiate exercises in chair. Pt stated to move BLE, but then declined stating he is tired and weak. PT continued to encourage pt, but pt unwilling. At end of session, pt was left seated in [facility] with alarm engaged, nurse call bell and all needs in reach.     Therapy Documentation Precautions:  Precautions Precautions: Fall, Back Precaution Booklet Issued: Yes (comment) Precaution Comments: reviewed back precautions with pt, Required Braces or Orthoses: Spinal Brace Spinal Brace: Thoracolumbosacral orthotic, Applied in sitting position Restrictions Weight Bearing Restrictions: No General: PT Amount of Missed Time (min): 75 Minutes PT Missed Treatment Reason: Patient fatigue;Patient unwilling to participate   Therapy/Group: Individual Therapy  Marquette Old, PT, DPT 09/12/2022, 2:37 PM

## 2022-09-13 NOTE — Progress Notes (Signed)
Physical Therapy Session Note  Patient Details  Name: Justin Potter MRN: [id number] Date of Birth: [birth year]  Today's Date: 09/13/2022 PT Individual Time: [phone]   40 min   Short Term Goals: Week 1:  PT Short Term Goal 1 (Week 1): Pt will stand with +1 assist PT Short Term Goal 2 (Week 1): Pt will perform squat pivot transfer with mod A PT Short Term Goal 3 (Week 1): Pt will initiate standing transfer training   Skilled Therapeutic Interventions/Progress Updates:   Pt received supine in bed and agreeable to PT. Donning thigh high Ted hose in supine with total A. Donning Pants while supine in bed with total A for clothing management and max assist for roll with max cues for attention to task and to maintain back precautions through log roll. Supine>sit transfer with max assist at trunk. Mild pain in the L knee once EOB but resolved with repositioning of L foot. Donnign TLSO sitting OEB with total A for back management and supervision assist for balance. Slide board transfer to [facility] with mod assist overall and max cues for attention to task and reassurance of safety to ease anxiety with transfer.   Transported to rehab gym in [facility] sit<>stand in parallel bars  x 3 attempts. Pt able to come into standing with assist at trunk for anterior weight shift on third attempt, but noted to resist assistance into standing on first 2 attempts. Pt unable to verbalize reason for resistance to assist from PT. Throughout session, pt confabulating and engaged in conversion that is not directed toward task at had or current situation for improved mobility and rehab following hospitalization. Pt unable to be redirected.  Pt left with PTA at end of session.       Therapy Documentation Precautions:  Precautions Precautions: Fall, Back Precaution Booklet Issued: Yes (comment) Precaution Comments: reviewed back precautions with pt, Required Braces or Orthoses: Spinal Brace Spinal Brace: Thoracolumbosacral  orthotic, Applied in sitting position Restrictions Weight Bearing Restrictions: No General:   Vital Signs: Therapy Vitals Pulse Rate: 64 BP: (!) 144/72 Pain:   Mobility:   Locomotion :    Trunk/Postural Assessment :    Balance:   Exercises:   Other Treatments:      Therapy/Group: Individual Therapy  Lorie Phenix 09/13/2022, 7:59 AM

## 2022-09-13 NOTE — Progress Notes (Signed)
Occupational Therapy Session Note  Patient Details  Name: CLARENCE HAISLEY MRN: [id number] Date of Birth: April 24, 1926  Today's Date: 09/13/2022 OT Individual Time: [id number] OT Individual Time Calculation (min): 30 min   Today's Date: 09/13/2022 OT Individual Time: [id number] OT Individual Time Calculation (min): 42 min  and Today's Date: 09/13/2022 OT Missed Time: 18 Minutes Missed Time Reason: Other (comment) (AMS)  Short Term Goals: Week 2:  OT Short Term Goal 1 (Week 2): Pt will require mod A for self feeding using low vision and AE with cues OT Short Term Goal 2 (Week 2): Pt will require min a for UB bathing and pullover shirt seated OT Short Term Goal 3 (Week 2): Pt will perform sit to stand in prep for LB self care with mod A and LRAD OT Short Term Goal 4 (Week 2): Pt will perform sit to stand in prep for LB self care with mod A and LRAD  Skilled Therapeutic Interventions/Progress Updates:   Session 1: Pt received sitting in [facility] for skilled OT session with focus on simple ADL tasks and cognitive functioning. Pt agreeable to interventions, demonstrating overall pleasant mood. Pt with un-rated pain, stating "I have pain all the time." OT offering intermediate rest breaks and positioning suggestions throughout session to address pain/fatigue and maximize participation/safety in session.   Pt's lights turned on and date/location reviewed with patient for general orientation. Pt's door closed with TV muted for decreased distraction. Seated in WC, pt participates in oral care and face washing with heavy encouragement and increased time to process request to participate in task.   Pt able to answer biographical questions throughout session for cognitive challenge, although appearing to be hallucinating objects around his room such as a "black feather" and "white ribbon," asking therapist to retrieve them for him.  Pt remained sitting in [facility] with all immediate needs met at end of session. Pt  continues to be appropriate for skilled OT intervention to promote further functional independence.   Session 2: Pt received sitting in [facility] for skilled OT session with focus on simple general conditioning. Pt greatly confused and frustrated throughout session, therapeutic tone/volume used to facilitate calm environment with external distractions removed.   Pt declining to leave room, stating "I'll leave my room to go to the party tonight." Pt re-oriented to location and purpose of rehab stay, as well as, role of OT and purpose of session. Pt perseverates on topic of [place] politics and community members from [place] descent.   Pt willing to participate in exercises while listening to music: -Marches -Leg kicks Engaging in exercises for ~30 secs each.   Pt and OT discuss visual problems, pt sharing frustrations, and encouraged to wear glasses.   Pt hallucinating conversation with person other than therapist, becoming frustrated with this OT, beginning to ask questions about therapists racial origin, therefore session concluded.    RN made aware of pt's cognitive condition for further assessment.   Pt remained sitting in [facility] with all immediate needs met at end of session. Pt continues to be appropriate for skilled OT intervention to promote further functional independence.   Therapy Documentation Precautions:  Precautions Precautions: Fall, Back Precaution Booklet Issued: Yes (comment) Precaution Comments: reviewed back precautions with pt, Required Braces or Orthoses: Spinal Brace Spinal Brace: Thoracolumbosacral orthotic, Applied in sitting position Restrictions Weight Bearing Restrictions: No  Therapy/Group: Individual Therapy  Maudie Mercury, OTR/L, MSOT  09/13/2022, 6:23 AM

## 2022-09-13 NOTE — Progress Notes (Signed)
PROGRESS NOTE   Subjective/Complaints: Pt had another reasonable night. Much more alert when I came in this morning. Denies any issues. Did not report pain to me  ROS: Limited due to cognitive/behavioral    Objective:   No results found. Recent Labs    09/11/22 0534  WBC 10.8*  HGB 9.8*  HCT 30.1*  PLT 415*    Recent Labs    09/11/22 0534 09/12/22 0515  NA 138 141  K 3.4* 3.5  CL 103 105  CO2 24 23  GLUCOSE 106* 104*  BUN 30* 24*  CREATININE 0.89 0.87  CALCIUM 8.9 8.9     Intake/Output Summary (Last 24 hours) at 09/13/2022 0856 Last data filed at 09/13/2022 0753 Gross per 24 hour  Intake 986.64 ml  Output --  Net 986.64 ml         Physical Exam: Vital Signs Blood pressure (!) 144/72, pulse 64, temperature 98.1 F (36.7 C), temperature source Oral, resp. rate 16, height 6' 2"$  (1.88 m), weight 79.6 kg, SpO2 98 %.  Constitutional: No distress . Vital signs reviewed. HEENT: NCAT, EOMI, oral membranes moist Neck: supple Cardiovascular: RRR without murmur. No JVD    Respiratory/Chest: CTA Bilaterally without wheezes or rales. Normal effort    GI/Abdomen: BS +, non-tender, non-distended Ext: no clubbing, cyanosis, or edema Psych: cooperative, less confused Neurological: much more alert. Follows commands. Speech much clearer today. . Both legs still somewhat tight in extension. ?resisting ROM/knee pain. DTR's 1+. No clonus.  Musculoskeletal:     Right lower leg: tr edema.     Left lower leg: tr edema.     Back TTP/ROM   Assessment/Plan: 1. Functional deficits which require 3+ hours per day of interdisciplinary therapy in a comprehensive inpatient rehab setting. Physiatrist is providing close team supervision and 24 hour management of active medical problems listed below. Physiatrist and rehab team continue to assess barriers to discharge/monitor patient progress toward functional and medical  goals  Care Tool:  Bathing    Body parts bathed by patient: Face, Chest, Right arm, Left arm   Body parts bathed by helper: Left lower leg, Right lower leg, Left upper leg, Right upper leg, Buttocks, Abdomen     Bathing assist Assist Level: Dependent - Patient 0%     Upper Body Dressing/Undressing Upper body dressing   What is the patient wearing?: Pull over shirt    Upper body assist Assist Level: Maximal Assistance - Patient 25 - 49%    Lower Body Dressing/Undressing Lower body dressing      What is the patient wearing?: Incontinence brief, Pants     Lower body assist Assist for lower body dressing: Dependent - Patient 0%     Toileting Toileting    Toileting assist Assist for toileting: Dependent - Patient 0%     Transfers Chair/bed transfer  Transfers assist     Chair/bed transfer assist level: Maximal Assistance - Patient 25 - 49%     Locomotion Ambulation   Ambulation assist   Ambulation activity did not occur: Safety/medical concerns          Walk 10 feet activity   Assist  Walk 10 feet activity  did not occur: Safety/medical concerns        Walk 50 feet activity   Assist           Walk 150 feet activity   Assist Walk 150 feet activity did not occur: Safety/medical concerns         Walk 10 feet on uneven surface  activity   Assist Walk 10 feet on uneven surfaces activity did not occur: Safety/medical concerns         Wheelchair     Assist Is the patient using a wheelchair?: Yes Type of Wheelchair: Manual    Wheelchair assist level: Supervision/Verbal cueing Max wheelchair distance: 120 ft    Wheelchair 50 feet with 2 turns activity    Assist        Assist Level: Supervision/Verbal cueing   Wheelchair 150 feet activity     Assist      Assist Level: Minimal Assistance - Patient > 75%   Blood pressure (!) 144/72, pulse 64, temperature 98.1 F (36.7 C), temperature source Oral, resp.  rate 16, height 6' 2"$  (1.88 m), weight 79.6 kg, SpO2 98 %.   Medical Problem List and Plan: 1. Functional deficits secondary to thoracic spine compression fracture             -patient may shower             -ELOS/Goals: 10-14 days Min-ModA            --Continue CIR therapies including PT, OT, reduced to 15/7  2.  Afib: continue eliquis    3. Left buttock  back and leg pain: Continue Tylenol as needed; tramadol as needed.    2/15- will schedule Tramadol 50 mg q6 hours and cont' tylenol prn.   2/21 careful with pain meds d/t confusion/delirium.    -reduced tramadol to 27m yesterday 4. Delirium: continue Seroquel 25 mg q HS   -has received seroquel last 2 nights  -reducec tramadol to 260mtid  -UA +, empiric ceftriaxone started 2/20, UCX still "in process". Will ask RN to follow up on this 2/21.   -continue sleep chart  2/21 much more alert today  -continue ivf hs, rx of uti, reduced tramadol 5. Neuropsych/cognition: This patient is not capable of making decisions on his own behalf.   6. Skin/Wound Care: Routine skin care checks   7. Fluids/Electrolytes/Nutrition:     -2/19 mild hypokalemia: added K+ to IVF  - 2/21 sl improvement yesterday--follow up tomorrow   -hopefully PO will pick up now that he's more alert. Ate 45% breakfast 8: Hypertension: monitor TID and prn             -continue hydralazine 10 mg TID and monitor with ambulation   2/15- BP a little elevated- but hurting more- will monitor for trend before changing meds  2/21--adjusted hydralazine to 2568mid-- bp controlled 9: Prerenal azotemia:      2/19 BUN up to 30. Begin IVF today 1/2ns at 50cc hr.  2/20 BUN improved to 24 today, continue ivf but change to HS only  2/21 continue hs ivf, recheck labs tomorrow 10: Anemia; Recent drop likely due to hematoma             2/19 hgb 9.8  11: Constipation:     -LBM 2/18   LOS: 7 days A FACE TO FACE EVALUATION WAS PERFORMED  ZacMeredith Staggers21/2024, 8:56 AM

## 2022-09-13 NOTE — Progress Notes (Signed)
Patient ID: Justin Potter, male   DOB: 1926/04/09, 89 y.o.   MRN: [id number]  SW left message for [facility], liaison for [employer]. SW waiting on follow-up.  Loralee Pacas, MSW, [place] Office: [phone] Cell: [phone] Fax: [phone]

## 2022-09-13 NOTE — Plan of Care (Signed)
  Problem: RH Balance Goal: LTG: Patient will maintain dynamic sitting balance (OT) Description: LTG:  Patient will maintain dynamic sitting balance with assistance during activities of daily living (OT) Flowsheets (Taken 09/13/2022 0801) LTG: Pt will maintain dynamic sitting balance during ADLs with: (downgraded JLS) Supervision/Verbal cueing Note: downgraded JLS  Goal: LTG Patient will maintain dynamic standing with ADLs (OT) Description: LTG:  Patient will maintain dynamic standing balance with assist during activities of daily living (OT)  Flowsheets (Taken 09/13/2022 0801) LTG: Pt will maintain dynamic standing balance during ADLs with: (downgraded JLS) Minimal Assistance - Patient > 75% Note: downgraded JLS    Problem: RH Eating Goal: LTG Patient will perform eating w/assist, cues/equip (OT) Description: LTG: Patient will perform eating with assist, with/without cues using equipment (OT) Flowsheets (Taken 09/13/2022 0801) LTG: Pt will perform eating with assistance level of: (downgraded JLS) Supervision/Verbal cueing Note: downgraded JLS    Problem: RH Grooming Goal: LTG Patient will perform grooming w/assist,cues/equip (OT) Description: LTG: Patient will perform grooming with assist, with/without cues using equipment (OT) Flowsheets (Taken 09/13/2022 0801) LTG: Pt will perform grooming with assistance level of: (downgraded JLS) Supervision/Verbal cueing Note: downgraded JLS    Problem: RH Bathing Goal: LTG Patient will bathe all body parts with assist levels (OT) Description: LTG: Patient will bathe all body parts with assist levels (OT) Flowsheets (Taken 09/13/2022 0801) LTG: Pt will perform bathing with assistance level/cueing: (downgraded JLS) Minimal Assistance - Patient > 75% Note: downgraded JLS    Problem: RH Dressing Goal: LTG Patient will perform upper body dressing (OT) Description: LTG Patient will perform upper body dressing with assist, with/without cues  (OT). Flowsheets (Taken 09/13/2022 0801) LTG: Pt will perform upper body dressing with assistance level of: (downgraded JLS) Supervision/Verbal cueing Note: downgraded JLS  Goal: LTG Patient will perform lower body dressing w/assist (OT) Description: LTG: Patient will perform lower body dressing with assist, with/without cues in positioning using equipment (OT) Flowsheets (Taken 09/13/2022 0801) LTG: Pt will perform lower body dressing with assistance level of: (downgraded JLS) Moderate Assistance - Patient 50 - 74% Note: downgraded JLS    Problem: RH Toileting Goal: LTG Patient will perform toileting task (3/3 steps) with assistance level (OT) Description: LTG: Patient will perform toileting task (3/3 steps) with assistance level (OT)  Flowsheets (Taken 09/13/2022 0801) LTG: Pt will perform toileting task (3/3 steps) with assistance level: (downgraded JLS) Moderate Assistance - Patient 50 - 74% Note: downgraded JLS    Problem: RH Toilet Transfers Goal: LTG Patient will perform toilet transfers w/assist (OT) Description: LTG: Patient will perform toilet transfers with assist, with/without cues using equipment (OT) Flowsheets (Taken 09/13/2022 0801) LTG: Pt will perform toilet transfers with assistance level of: (downgraded JLS) Minimal Assistance - Patient > 75% Note: downgraded JLS    Problem: RH Tub/Shower Transfers Goal: LTG Patient will perform tub/shower transfers w/assist (OT) Description: LTG: Patient will perform tub/shower transfers with assist, with/without cues using equipment (OT) Flowsheets (Taken 09/13/2022 0801) LTG: Pt will perform tub/shower stall transfers with assistance level of: (downgraded JLS) Minimal Assistance - Patient > 75% Note: downgraded JLS

## 2022-09-13 NOTE — Progress Notes (Signed)
Physical Therapy Session Note  Patient Details  Name: Justin Potter MRN: [id number] Date of Birth: [birth year]  Today's Date: 09/13/2022 PT Individual Time: 1000-1045 PT Individual Time Calculation (min): 45 min   Short Term Goals: Week 1:  PT Short Term Goal 1 (Week 1): Pt will stand with +1 assist PT Short Term Goal 2 (Week 1): Pt will perform squat pivot transfer with mod A PT Short Term Goal 3 (Week 1): Pt will initiate standing transfer training  Skilled Therapeutic Interventions/Progress Updates: Pt presented in w/c handoff from PT in parallel bars. Pt agreeable to therapy. Pt denies pain at rest. Pt intermittently displays bouts of confusion with pt speaking about "facing the people" and other nonsensical phrases. Pt worked on Sit to stand in parallel bars with pt able to stand x 3 with pt requiring heavy mod A consistently on all 3 stands. On last stand pt required max to total A to maintain standing. With each stand pt unable to fully extend BLE and flexed at hip. Pt noted to pull self up using primarily BUE vs pushing through BLE. Pt then moved out of bars and attempted BLE ROM. Pt attempted to perform LAQ on LLE and was able to perform x 3 before pt increased groaning and c/o BLE pain. Pt was able to perform small range heel slides with pillow placed on R foot and pt was able to complete x 8 with gentle facilitation. Pt then demonstrating increased c/o pain and demosntrating increased pain behaviors. Pt transported to room and pt was able to remain in w/c as next session to begin shortly. Pt left in w/c with belt alarm on, soft touch bell in hands and pt in NAD.      Therapy Documentation Precautions:  Precautions Precautions: Fall, Back Precaution Booklet Issued: Yes (comment) Precaution Comments: reviewed back precautions with pt, Required Braces or Orthoses: Spinal Brace Spinal Brace: Thoracolumbosacral orthotic, Applied in sitting position Restrictions Weight Bearing  Restrictions: No General:   Vital Signs: Therapy Vitals Temp: 97.8 F (36.6 C) Temp Source: Oral Pulse Rate: 69 Resp: 16 BP: 109/64 Patient Position (if appropriate): Sitting Oxygen Therapy SpO2: 91 % O2 Device: Room Air Pain:   Mobility:   Locomotion :    Trunk/Postural Assessment :    Balance:   Exercises:   Other Treatments:      Therapy/Group: Individual Therapy  Makayle Krahn 09/13/2022, 1:53 PM

## 2022-09-14 LAB — CBC
HCT: 31.4 % — ABNORMAL LOW (ref 39.0–52.0)
Hemoglobin: 10 g/dL — ABNORMAL LOW (ref 13.0–17.0)
MCH: 29.4 pg (ref 26.0–34.0)
MCHC: 31.8 g/dL (ref 30.0–36.0)
MCV: 92.4 fL (ref 80.0–100.0)
Platelets: 411 10*3/uL — ABNORMAL HIGH (ref 150–400)
RBC: 3.4 MIL/uL — ABNORMAL LOW (ref 4.22–5.81)
RDW: 14.4 % (ref 11.5–15.5)
WBC: 9.3 10*3/uL (ref 4.0–10.5)
nRBC: 0 % (ref 0.0–0.2)

## 2022-09-14 LAB — BASIC METABOLIC PANEL
Anion gap: 11 (ref 5–15)
BUN: 30 mg/dL — ABNORMAL HIGH (ref 8–23)
CO2: 22 mmol/L (ref 22–32)
Calcium: 8.8 mg/dL — ABNORMAL LOW (ref 8.9–10.3)
Chloride: 108 mmol/L (ref 98–111)
Creatinine, Ser: 0.88 mg/dL (ref 0.61–1.24)
GFR, Estimated: >60 mL/min (ref >60)
Glucose, Bld: 103 mg/dL — ABNORMAL HIGH (ref 70–99)
Potassium: 3.5 mmol/L (ref 3.5–5.1)
Sodium: 141 mmol/L (ref 135–145)

## 2022-09-14 MED ORDER — POTASSIUM CHLORIDE IN NACL 20-0.45 MEQ/L-% IV SOLN
INTRAVENOUS | Status: DC
  Filled 2022-09-14 (×5): qty 1000

## 2022-09-14 NOTE — Progress Notes (Addendum)
PROGRESS NOTE   Subjective/Complaints: Pt slept last night. Alert but confused this morning. Thinks he has to pay a bill.   ROS: Limited due to cognitive/behavioral     Objective:   No results found. Recent Labs    09/14/22 0707  WBC 9.3  HGB 10.0*  HCT 31.4*  PLT 411*    Recent Labs    09/12/22 0515 09/14/22 0707  NA 141 141  K 3.5 3.5  CL 105 108  CO2 23 22  GLUCOSE 104* 103*  BUN 24* 30*  CREATININE 0.87 0.88  CALCIUM 8.9 8.8*     Intake/Output Summary (Last 24 hours) at 09/14/2022 0902 Last data filed at 09/13/2022 1758 Gross per 24 hour  Intake 357 ml  Output --  Net 357 ml         Physical Exam: Vital Signs Blood pressure (!) 137/52, pulse 82, temperature 97.8 F (36.6 C), temperature source Oral, resp. rate 18, height 6' 2"$  (1.88 m), weight 79.6 kg, SpO2 99 %.  Constitutional: No distress . Vital signs reviewed. HEENT: NCAT, EOMI, oral membranes moist Neck: supple Cardiovascular: RRR without murmur. No JVD    Respiratory/Chest: CTA Bilaterally without wheezes or rales. Normal effort    GI/Abdomen: BS +, non-tender, non-distended Ext: no clubbing, cyanosis, or edema Psych: confused and distracted/restless  Neurological: much more alert. Follows commands. Speech much clearer today. Extensor tone in LE's? ?resisting ROM/knee pain. DTR's 1+. No clonus.  Musculoskeletal:     Right lower leg: tr edema.     Left lower leg: tr edema.     Back TTP/ROM   Assessment/Plan: 1. Functional deficits which require 3+ hours per day of interdisciplinary therapy in a comprehensive inpatient rehab setting. Physiatrist is providing close team supervision and 24 hour management of active medical problems listed below. Physiatrist and rehab team continue to assess barriers to discharge/monitor patient progress toward functional and medical goals  Care Tool:  Bathing    Body parts bathed by patient: Face,  Chest, Right arm, Left arm   Body parts bathed by helper: Left lower leg, Right lower leg, Left upper leg, Right upper leg, Buttocks, Abdomen     Bathing assist Assist Level: Dependent - Patient 0%     Upper Body Dressing/Undressing Upper body dressing   What is the patient wearing?: Pull over shirt    Upper body assist Assist Level: Maximal Assistance - Patient 25 - 49%    Lower Body Dressing/Undressing Lower body dressing      What is the patient wearing?: Incontinence brief, Pants     Lower body assist Assist for lower body dressing: Dependent - Patient 0%     Toileting Toileting    Toileting assist Assist for toileting: Dependent - Patient 0%     Transfers Chair/bed transfer  Transfers assist     Chair/bed transfer assist level: Maximal Assistance - Patient 25 - 49%     Locomotion Ambulation   Ambulation assist   Ambulation activity did not occur: Safety/medical concerns          Walk 10 feet activity   Assist  Walk 10 feet activity did not occur: Safety/medical concerns  Walk 50 feet activity   Assist           Walk 150 feet activity   Assist Walk 150 feet activity did not occur: Safety/medical concerns         Walk 10 feet on uneven surface  activity   Assist Walk 10 feet on uneven surfaces activity did not occur: Safety/medical concerns         Wheelchair     Assist Is the patient using a wheelchair?: Yes Type of Wheelchair: Manual    Wheelchair assist level: Supervision/Verbal cueing Max wheelchair distance: 120 ft    Wheelchair 50 feet with 2 turns activity    Assist        Assist Level: Supervision/Verbal cueing   Wheelchair 150 feet activity     Assist      Assist Level: Minimal Assistance - Patient > 75%   Blood pressure (!) 137/52, pulse 82, temperature 97.8 F (36.6 C), temperature source Oral, resp. rate 18, height 6' 2"$  (1.88 m), weight 79.6 kg, SpO2 99 %.   Medical  Problem List and Plan: 1. Functional deficits secondary to thoracic spine compression fracture             -patient may shower             -ELOS/Goals: 10-14 days Min-ModA            -Continue CIR therapies including PT, OT reduced to 15/7  2.  Afib: continue eliquis    3. Left buttock  back and leg pain: Continue Tylenol as needed; tramadol as needed.    2/15- will schedule Tramadol 50 mg q6 hours and cont' tylenol prn.   2/22 being conservative with pain meds d/t confusion/delirium.    -reduced tramadol to 83m 2/20 4. Delirium: continue Seroquel 25 mg q HS   2/22-now taking seroquel consistently at night  -UA +, empiric ceftriaxone started 2/20, UCX with 100k GNR--awaiting full results  -continue sleep chart  -pt much more alert, but still confused    -have stopped tramadol completely, use tylenol for pain  -increase IVF 5. Neuropsych/cognition: This patient is not capable of making decisions on his own behalf.   6. Skin/Wound Care: Routine skin care checks   7. Fluids/Electrolytes/Nutrition:     -2/19 mild hypokalemia: added K+ to IVF  - 2/22 eating better but still insufficient   -IVF per #9 8: Hypertension: monitor TID and prn             -continue hydralazine 10 mg TID and monitor with ambulation   2/15- BP a little elevated- but hurting more- will monitor for trend before changing meds  2/21--adjusted hydralazine to 288mtid-- bp controlled 9: Prerenal azotemia:      2/19 BUN up to 30. Begin IVF today 1/2ns at 50cc hr.  2/20 BUN improved to 24 today, continue ivf but change to HS only  2/22 BUN up to 30 again. change IVF back to continuous  -recheck bmet tomorrow 10: Anemia; Recent drop likely due to hematoma--stable             2/22 10.0  11: Constipation:     -LBM 2/21   LOS: 8 days A FACE TO FACE EVALUATION WAS PERFORMED  ZaMeredith Staggers/22/2024, 9:02 AM

## 2022-09-14 NOTE — Progress Notes (Signed)
Physical Therapy Note  Patient Details  Name: Justin Potter MRN: [id number] Date of Birth: 10/31/1925 Today's Date: 09/14/2022    Pt missed 30 minutes of scheduled therapy d/t confusion and inability to follow directions. Pt perseverating on nonsensical statements. When reoriented to hospital and PT role to help him exercise he states "that sounds nice, we can do that after." After continued attempts at reorientation and encouragement to participate, pt remained in bed and was left with all needs in reach and alarm active.    Mickel Fuchs 09/14/2022, 3:57 PM

## 2022-09-14 NOTE — Progress Notes (Signed)
Occupational Therapy Session Note  Patient Details  Name: Justin Potter MRN: [id number] Date of Birth: May 26, 1926  Today's Date: 09/14/2022 OT Individual Time: 0830-0910 OT Individual Time Calculation (min): 40 min    Short Term Goals: Week 2:  OT Short Term Goal 1 (Week 2): Pt will require mod A for self feeding using low vision and AE with cues OT Short Term Goal 2 (Week 2): Pt will require min a for UB bathing and pullover shirt seated OT Short Term Goal 3 (Week 2): Pt will perform sit to stand in prep for LB self care with mod A and LRAD OT Short Term Goal 4 (Week 2): Pt will perform sit to stand in prep for LB self care with mod A and LRAD  Skilled Therapeutic Interventions/Progress Updates:    1:1 Pt in hospital gown pulling at it and has hand fisted saying he has something in his hands but nothing is present in hands. Pt trying to spit out a pill but unable to bring hand to mouth- RN called into room and reported it is Elliquis and pt unwilling to take it. Pt slightly combative and unwilling to participate in any familiar task- refused to allow for care. Pt resistive to allowing basic care of washing his face and donning clothing. Pt was dependent for donning shirt and pants with unable to help with rolling so a 2nd person called in. Pt very paranoid with all action or discussions. Pt left in bed due to unsafe to attempt transfer out of bed. Pt left resting in bed with bed alarm on. Opened blinds and turned on light and tv noise off.   Therapy Documentation Precautions:  Precautions Precautions: Fall, Back Precaution Booklet Issued: Yes (comment) Precaution Comments: reviewed back precautions with pt, Required Braces or Orthoses: Spinal Brace Spinal Brace: Thoracolumbosacral orthotic, Applied in sitting position Restrictions Weight Bearing Restrictions: No General: General OT Amount of Missed Time: 20 Minutes  Pain:  No indications of pain in session   Therapy/Group:  Individual Therapy  Willeen Cass [facility] 09/14/2022, 9:14 AM

## 2022-09-14 NOTE — Progress Notes (Signed)
Physical Therapy Session Note  Patient Details  Name: Justin Potter MRN: [id number] Date of Birth: [birth year]  Today's Date: 09/14/2022 PT Individual Time: 1115-1145 PT Individual Time Calculation (min): 30 min   Short Term Goals: Week 1:  PT Short Term Goal 1 (Week 1): Pt will stand with +1 assist PT Short Term Goal 2 (Week 1): Pt will perform squat pivot transfer with mod A PT Short Term Goal 3 (Week 1): Pt will initiate standing transfer training  Skilled Therapeutic Interventions/Progress Updates:    Pt recd in bed with his son present. Discussed pt's current condition with son, Justin Potter, who is very concerned about possible UTI, preliminary results only in chart. Justin Potter states this condition is very outside his dad's norm and seems to be worsening. Pt was confabulatory and perseverating on money/finances. Attempted redirection towards therapeutic activity multiple times, but pt unwilling to attempt any sort of mobility at this time d/t confusion and mild agitation. Pt gently reoriented with minimal success. Pt remained in bed and was left with all needs in reach and alarm active. Pt missed 15 min of scheduled therapy d/t mild agitation and confusion/refusal.  Therapy Documentation Precautions:  Precautions Precautions: Fall, Back Precaution Booklet Issued: Yes (comment) Precaution Comments: reviewed back precautions with pt, Required Braces or Orthoses: Spinal Brace Spinal Brace: Thoracolumbosacral orthotic, Applied in sitting position Restrictions Weight Bearing Restrictions: No General: PT Amount of Missed Time (min): 15 Minutes PT Missed Treatment Reason: Increased agitation;Patient unwilling to participate     Therapy/Group: Individual Therapy  Mickel Fuchs 09/14/2022, 12:03 PM

## 2022-09-14 NOTE — Progress Notes (Signed)
Patient is alert but confused and refused to take his meds. MD notified

## 2022-09-15 DIAGNOSIS — A499 Bacterial infection, unspecified: Secondary | ICD-10-CM

## 2022-09-15 DIAGNOSIS — N390 Urinary tract infection, site not specified: Secondary | ICD-10-CM

## 2022-09-15 LAB — URINE CULTURE: Culture: >=100000 — AB

## 2022-09-15 LAB — BASIC METABOLIC PANEL
Anion gap: 10 (ref 5–15)
BUN: 24 mg/dL — ABNORMAL HIGH (ref 8–23)
CO2: 21 mmol/L — ABNORMAL LOW (ref 22–32)
Calcium: 8.6 mg/dL — ABNORMAL LOW (ref 8.9–10.3)
Chloride: 109 mmol/L (ref 98–111)
Creatinine, Ser: 0.86 mg/dL (ref 0.61–1.24)
GFR, Estimated: >60 mL/min (ref >60)
Glucose, Bld: 105 mg/dL — ABNORMAL HIGH (ref 70–99)
Potassium: 3.5 mmol/L (ref 3.5–5.1)
Sodium: 140 mmol/L (ref 135–145)

## 2022-09-15 NOTE — Progress Notes (Addendum)
PROGRESS NOTE   Subjective/Complaints: Pt is more alert but remains confused with hallucinations. Does not appear to be in pain. Thought he was in a car this morning when I came in.   ROS: Limited due to cognitive/behavioral    Objective:   No results found. Recent Labs    09/14/22 0707  WBC 9.3  HGB 10.0*  HCT 31.4*  PLT 411*    Recent Labs    09/14/22 0707 09/15/22 0713  NA 141 140  K 3.5 3.5  CL 108 109  CO2 22 21*  GLUCOSE 103* 105*  BUN 30* 24*  CREATININE 0.88 0.86  CALCIUM 8.8* 8.6*     Intake/Output Summary (Last 24 hours) at 09/15/2022 0906 Last data filed at 09/14/2022 2125 Gross per 24 hour  Intake 483.44 ml  Output 75 ml  Net 408.44 ml         Physical Exam: Vital Signs Blood pressure (!) 159/65, pulse 78, temperature 98 F (36.7 C), resp. rate 17, height '6\' 2"'$  (1.88 m), weight 79.6 kg, SpO2 98 %.  Constitutional: No distress . Vital signs reviewed. HEENT: NCAT, EOMI, oral membranes moist Neck: supple Cardiovascular: RRR without murmur. No JVD    Respiratory/Chest: CTA Bilaterally without wheezes or rales. Normal effort    GI/Abdomen: BS +, non-tender, non-distended Ext: no clubbing, cyanosis, or edema Psych: confused, distracted, hallucinating, non-agitated Neurological:  very alert. Follows commands intermittently. Speech clearer. Extensor tone in LE's? ?resisting ROM/knee pain. DTR's 1+. No clonus.  Musculoskeletal:     Right lower leg: tr edema.     Left lower leg: tr edema.     Back TTP/ROM   Assessment/Plan: 1. Functional deficits which require 3+ hours per day of interdisciplinary therapy in a comprehensive inpatient rehab setting. Physiatrist is providing close team supervision and 24 hour management of active medical problems listed below. Physiatrist and rehab team continue to assess barriers to discharge/monitor patient progress toward functional and medical  goals  Care Tool:  Bathing    Body parts bathed by patient: Face, Chest, Right arm, Left arm   Body parts bathed by helper: Left lower leg, Right lower leg, Left upper leg, Right upper leg, Buttocks, Abdomen     Bathing assist Assist Level: Dependent - Patient 0%     Upper Body Dressing/Undressing Upper body dressing   What is the patient wearing?: Pull over shirt    Upper body assist Assist Level: Dependent - Patient 0%    Lower Body Dressing/Undressing Lower body dressing      What is the patient wearing?: Incontinence brief, Pants     Lower body assist Assist for lower body dressing: Dependent - Patient 0%     Toileting Toileting    Toileting assist Assist for toileting: Dependent - Patient 0%     Transfers Chair/bed transfer  Transfers assist     Chair/bed transfer assist level: Maximal Assistance - Patient 25 - 49%     Locomotion Ambulation   Ambulation assist   Ambulation activity did not occur: Safety/medical concerns          Walk 10 feet activity   Assist  Walk 10 feet activity did not occur:  Safety/medical concerns        Walk 50 feet activity   Assist           Walk 150 feet activity   Assist Walk 150 feet activity did not occur: Safety/medical concerns         Walk 10 feet on uneven surface  activity   Assist Walk 10 feet on uneven surfaces activity did not occur: Safety/medical concerns         Wheelchair     Assist Is the patient using a wheelchair?: Yes Type of Wheelchair: Manual    Wheelchair assist level: Supervision/Verbal cueing Max wheelchair distance: 120 ft    Wheelchair 50 feet with 2 turns activity    Assist        Assist Level: Supervision/Verbal cueing   Wheelchair 150 feet activity     Assist      Assist Level: Minimal Assistance - Patient > 75%   Blood pressure (!) 159/65, pulse 78, temperature 98 F (36.7 C), resp. rate 17, height '6\' 2"'$  (1.88 m), weight 79.6  kg, SpO2 98 %.   Medical Problem List and Plan: 1. Functional deficits secondary to thoracic spine compression fracture             -patient may shower             -ELOS/Goals: 10-14 days Min-ModA            -Continue CIR therapies including PT, OT.  reduced to 15/7 -->SNF 2.  Afib: continue eliquis    3. Left buttock  back and leg pain: Continue Tylenol as needed; tramadol as needed.    2/15- will schedule Tramadol 50 mg q6 hours and cont' tylenol prn.   2/23 have stopped tramadol d/t confusion 4. Delirium: continue Seroquel 25 mg q HS   2/23-now taking seroquel consistently at night--consider reduction to 12.'5mg'$   -UA +, empiric ceftriaxone started 2/20, UCX with 100k proteus--pan sensitive. Continue ceftriaxone for 7 days (thru Monday)  -continue sleep chart  -pt now alert, but still confused    -have stopped tramadol completely, use tylenol for pain  - continue IVF  -dc trazodone and robaxin as they may be contributing to confusion also. Seroquel is most appropriate med for agitation/sleep in this situation 5. Neuropsych/cognition: This patient is not capable of making decisions on his own behalf.   6. Skin/Wound Care: Routine skin care checks   7. Fluids/Electrolytes/Nutrition:     -2/19 mild hypokalemia: added K+ to IVF  - 2/22 eating better but still insufficient   -IVF per #9 8: Hypertension: monitor TID and prn             -continue hydralazine 10 mg TID and monitor with ambulation   2/15- BP a little elevated- but hurting more- will monitor for trend before changing meds  2/23--a  hydralazine '5mg'$  tid-- bp under fair control 9: Prerenal azotemia:      2/23 BUN back down to 24 -maintain IVF continuously -repeat BMET tomorrow 10: Anemia; Recent drop likely due to hematoma--stable              2/22 10.0  11: Constipation:     -LBM 2/21   LOS: 9 days A FACE TO FACE EVALUATION WAS PERFORMED  Meredith Staggers 09/15/2022, 9:06 AM

## 2022-09-15 NOTE — Progress Notes (Signed)
Physical Therapy Session Note  Patient Details  Name: Justin Potter MRN: [id number] Date of Birth: [birth year]  Today's Date: 09/15/2022 PT Individual Time: 1420-1450 PT Individual Time Calculation (min): 30 min   Short Term Goals: Week 1:  PT Short Term Goal 1 (Week 1): Pt will stand with +1 assist PT Short Term Goal 2 (Week 1): Pt will perform squat pivot transfer with mod A PT Short Term Goal 3 (Week 1): Pt will initiate standing transfer training  Skilled Therapeutic Interventions/Progress Updates: Pt presented in bed agreeable to therapy. Pt c/o BLE pain, did not rate. Pt agreeable to EOB activities this session. Pt demonstrates mild confusion throughout session but able to follow commands consistently. Pt performed supine to sit with modA for BLE management and truncal support. With increased time pt was able to scoot to EOB with only CGA. PTA donned shoes total A.  Remainder of session pt focused on Sit to stand from elevated height with use of RW. Bed was elevated to approx 22in. Without instruction pt initated stand placing BUE on bed and demonstrated appropriate anterior lean to facilitate power up. Pt was able to successfully stand with heavy modA 2/5 times. On the other 3 unsuccessful attempts pt was able to clear bed but was unable to complete anterior translation to complete appropriate stand. Pt did require heavy multimodal cues for hand placement as pt would tend to place hands on second bar vs hand rests. Pt then required mod A primarily for BLE to transfer from sitting to supine. With increased time and PTA stabilizing pt's feet, he was able to boost self to [facility]. Pt left in bed at end of session with bed alarm on, call bell within reach and needs met.      Therapy Documentation Precautions:  Precautions Precautions: Fall, Back Precaution Booklet Issued: Yes (comment) Precaution Comments: reviewed back precautions with pt, Required Braces or Orthoses: Spinal Brace Spinal  Brace: Thoracolumbosacral orthotic, Applied in sitting position Restrictions Weight Bearing Restrictions: No General:   Vital Signs: Therapy Vitals Temp: 98 F (36.7 C) Pulse Rate: 64 Resp: 16 BP: 136/79 Patient Position (if appropriate): Lying Oxygen Therapy SpO2: 97 % O2 Device: Room Air Pain:   Mobility:   Locomotion :    Trunk/Postural Assessment :    Balance:   Exercises:   Other Treatments:      Therapy/Group: Individual Therapy  Caiya Bettes 09/15/2022, 3:55 PM

## 2022-09-15 NOTE — Progress Notes (Signed)
Occupational Therapy Session Note  Patient Details  Name: Justin Potter MRN: [id number] Date of Birth: May 11, 1926  Today's Date: 09/15/2022 OT Individual Time: [id number] OT Individual Time Calculation (min): 24 min  21 min missed as pt laying self back down and confused, unable to redirect to participate in meaningful session    Short Term Goals: Week 2:  OT Short Term Goal 1 (Week 2): Pt will require mod A for self feeding using low vision and AE with cues OT Short Term Goal 2 (Week 2): Pt will require min a for UB bathing and pullover shirt seated OT Short Term Goal 3 (Week 2): Pt will perform sit to stand in prep for LB self care with mod A and LRAD OT Short Term Goal 4 (Week 2): Pt will perform sit to stand in prep for LB self care with mod A and LRAD  Skilled Therapeutic Interventions/Progress Updates:  Pt greeted supine in bed pleasantly confused to surroundings asking to get "Out of the car." Gentle reorientation to surroundings. Pt completed supine ADLs for redirection with pt able to wash face with RUE with set- up assist.   Pt greeted with only brief on but declined allowing OTA to don pants. Pt was able to initiate supine>sit to EOB to L side with overall  MAX A however once EOB pt laying self back down continuing to ask where the "front was" and "how to get to the garage." Gentle redirection provided using environmental cues in room to orient to hospital. Opened blinds in room. Pt also hallucinating talking to "a man in the corner"  pt then fell asleep, unable to rearouse.  Pt left supine in bed with bed alarm activated and all needs within reach.  Therapy Documentation Precautions:  Precautions Precautions: Fall, Back Precaution Booklet Issued: Yes (comment) Precaution Comments: reviewed back precautions with pt, Required Braces or Orthoses: Spinal Brace Spinal Brace: Thoracolumbosacral orthotic, Applied in sitting position Restrictions Weight Bearing Restrictions:  No General: General OT Amount of Missed Time: 21 Minutes  Pain: No pain reported    Therapy/Group: Individual Therapy  Corinne Ports [facility] 09/15/2022, 12:01 PM

## 2022-09-15 NOTE — Progress Notes (Signed)
Physical Therapy Discharge Summary  Patient Details  Name: [name] MRN: [id number] Date of Birth: May 12, 1926  Date of Discharge from PT service:{Time; dates multiple:304500300}  {CHL IP REHAB PT TIME CALCULATION:304800500}   Patient has met {NUMBERS 0-12:18577} of 5 long term goals due to ability to compensate for deficits.  Patient to discharge at a wheelchair level Max Assist.   Patient's care partner is independent to provide the necessary physical assistance at discharge. Pt to d/c to SNF at max-tot A level. Pt has had intermittent sessions where he has required as little as mod A to stand to RW from elevated bed and mod A bed mobility, but often requires max-tot depending on cognitive status. No family education performed as pt has not participated in enough therapy at this time to do so, but pt's son has been involved in his care.   Reasons goals not met: Pt d/c earlier than expected d/t confusion/agitation and poor progress, d/c to SNF.   Recommendation:  Patient will benefit from ongoing skilled PT services in skilled nursing facility setting to continue to advance safe functional mobility, address ongoing impairments in strength, balance, transfers, gait, w/c mobility, and minimize fall risk.  Equipment: None provided  Reasons for discharge: change in medical status and discharge from hospital  Patient/family agrees with progress made and goals achieved: Yes  PT Discharge Precautions/Restrictions   Vital Signs Therapy Vitals Temp: 98 F (36.7 C) Pulse Rate: 64 Resp: 16 BP: 136/79 Patient Position (if appropriate): Lying Oxygen Therapy SpO2: 97 % O2 Device: Room Air Pain   Pain Interference   Vision/Perception     Cognition   Sensation   Motor     Mobility   Locomotion     Trunk/Postural Assessment     Balance   Extremity Assessment            Mickel Fuchs 09/15/2022, 3:55 PM

## 2022-09-15 NOTE — Progress Notes (Signed)
Physical Therapy Session Note  Patient Details  Name: Justin Potter MRN: [id number] Date of Birth: 08/11/1925  Today's Date: 09/15/2022 PT Individual Time: 1000-1100 PT Individual Time Calculation (min): 60 min   Short Term Goals: Week 1:  PT Short Term Goal 1 (Week 1): Pt will stand with +1 assist PT Short Term Goal 2 (Week 1): Pt will perform squat pivot transfer with mod A PT Short Term Goal 3 (Week 1): Pt will initiate standing transfer training  Skilled Therapeutic Interventions/Progress Updates:    Pt asleep in bed on arrival but easily roused. Pt continues to demo confused speech and maintains eyes closed, but was more aware and had increased arousal in sitting. Donned ted hose tot A. Donned pants in sitting with pt partially assisting to thread, max A to don shoes. Donned brace tot A. Attempted Sit to stand with stedy and +2 assist, but pt unable at this time. Transitioned to sara+ lift, which pt was able to achieve near full stand, but at this time noted brief to be completely saturated, front to back. Pt returned to bed with max A x 2 and participated in rolling with max A for dependent brief change. Incontinence recorded in flow sheet. Pt remained in bed after session and was left with all needs in reach and alarm active.   Therapy Documentation Precautions:  Precautions Precautions: Fall, Back Precaution Booklet Issued: Yes (comment) Precaution Comments: reviewed back precautions with pt, Required Braces or Orthoses: Spinal Brace Spinal Brace: Thoracolumbosacral orthotic, Applied in sitting position Restrictions Weight Bearing Restrictions: No General:       Therapy/Group: Individual Therapy  Mickel Fuchs 09/15/2022, 3:44 PM

## 2022-09-15 NOTE — NC FL2 (Signed)
[place] LEVEL OF CARE FORM     IDENTIFICATION  Patient Name: Justin Potter Birthdate: 1926/03/09 Sex: male Admission Date (Current Location): 09/06/2022  [facility] and [place] Number:  Herbalist and Address:  [facility] [place] [place] [address], [place], [place] [address]      Provider Number: [id number]  Attending Physician Name and Address:  Lovorn, Jinny Blossom, MD  Relative Name and Phone Number:       Current Level of Care: Hospital Recommended Level of Care: [place] Prior Approval Number:    Date Approved/Denied:   PASRR Number: [id number]  Discharge Plan: SNF    Current Diagnoses: Patient Active Problem List   Diagnosis Date Noted   AKI (acute kidney injury) ([place]) 08/31/2022   Leukocytosis 08/31/2022   Closed T6 spinal fracture ([place]) 08/31/2022   Closed T3 spinal fracture ([place]) 08/31/2022   Anemia 08/31/2022   Compression fracture of body of thoracic vertebra ([place]) 08/31/2022   Pneumothorax 05/21/2022   Intramuscular hematoma 03/14/2022   Hematoma and contusion 03/13/2022   Junctional bradycardia 03/12/2022   Hematoma of right hip 03/12/2022   Dysphagia 11/02/2020   Dysuria 11/02/2020   Acquired thrombophilia ([place]) 11/02/2020   Advanced nonexudative age-related macular degeneration of left eye with subfoveal involvement 05/26/2020   Exudative age-related macular degeneration of right eye with active choroidal neovascularization ([place]) 05/26/2020   Exudative age-related macular degeneration of left eye with inactive choroidal neovascularization ([place]) 05/26/2020   Advanced nonexudative age-related macular degeneration of right eye with subfoveal involvement 05/26/2020   Choroidal neovascularization of right eye 05/26/2020   Pain in right knee 10/07/2019   Long term current use of anticoagulant therapy 09/19/2013   Arthritis    S/P laparoscopic appendectomy Jan 2015 08/28/2013   Hyperlipidemia     Essential hypertension    Persistent atrial fibrillation (HCC)     Orientation RESPIRATION BLADDER Height & Weight     Self, Time  Normal Incontinent Weight: 175 lb 7.8 oz (79.6 kg) Height:  '6\' 2"'$  (188 cm)  BEHAVIORAL SYMPTOMS/MOOD NEUROLOGICAL BOWEL NUTRITION STATUS      Incontinent Diet  AMBULATORY STATUS COMMUNICATION OF NEEDS Skin   Extensive Assist Verbally Normal                       Personal Care Assistance Level of Assistance  Bathing, Feeding, Dressing, Total care Bathing Assistance: Limited assistance Feeding assistance: Limited assistance Dressing Assistance: Limited assistance Total Care Assistance: Limited assistance   Functional Limitations Info  Sight, Hearing, Speech Sight Info: Adequate Hearing Info: Adequate Speech Info: Adequate    SPECIAL CARE FACTORS FREQUENCY  PT (By licensed PT), OT (By licensed OT), Speech therapy     PT Frequency: 5xs per week OT Frequency: 5xs per week     Speech Therapy Frequency: 5xs per week      Contractures Contractures Info: Not present    Additional Factors Info  Code Status, Allergies Code Status Info: DNR Allergies Info: See discharge instructions           Current Medications (09/15/2022):  This is the current hospital active medication list Current Facility-Administered Medications  Medication Dose Route Frequency Provider Last Rate Last Admin   0.45 % NaCl with KCl 20 mEq / L infusion   Intravenous Continuous Meredith Staggers, MD 50 mL/hr at 09/15/22 1120 New Bag at 09/15/22 1120   acetaminophen (TYLENOL) tablet 325-650 mg  325-650 mg Oral Q4H PRN Setzer,  Edman Circle, PA-C       alum & mag hydroxide-simeth (MAALOX/MYLANTA) 200-200-20 MG/5ML suspension 30 mL  30 mL Oral Q4H PRN Setzer, Edman Circle, PA-C       apixaban (ELIQUIS) tablet 5 mg  5 mg Oral BID Barbie Banner, PA-C   5 mg at 09/15/22 0759   cefTRIAXone (ROCEPHIN) 1 g in sodium chloride 0.9 % 100 mL IVPB  1 g Intravenous Q24H Lovorn, Megan, MD  200 mL/hr at 09/15/22 0806 1 g at 09/15/22 0806   guaiFENesin-dextromethorphan (ROBITUSSIN DM) 100-10 MG/5ML syrup 5-10 mL  5-10 mL Oral Q6H PRN Barbie Banner, PA-C       hydrALAZINE (APRESOLINE) tablet 25 mg  25 mg Oral Q8H Meredith Staggers, MD   25 mg at 09/15/22 0559   lidocaine (LIDODERM) 5 % 2 patch  2 patch Transdermal Q24H Lovorn, Megan, MD   2 patch at 09/08/22 1004   polyethylene glycol (MIRALAX / GLYCOLAX) packet 17 g  17 g Oral Daily PRN Barbie Banner, PA-C       prochlorperazine (COMPAZINE) tablet 5-10 mg  5-10 mg Oral Q6H PRN Barbie Banner, PA-C       Or   prochlorperazine (COMPAZINE) injection 5-10 mg  5-10 mg Intramuscular Q6H PRN Barbie Banner, PA-C       Or   prochlorperazine (COMPAZINE) suppository 12.5 mg  12.5 mg Rectal Q6H PRN Setzer, Edman Circle, PA-C       QUEtiapine (SEROQUEL) tablet 12.5 mg  12.5 mg Oral Daily PRN Raulkar, Clide Deutscher, MD       QUEtiapine (SEROQUEL) tablet 25 mg  25 mg Oral Q2000 Alger Simons T, MD   25 mg at 09/14/22 2129   senna-docusate (Senokot-S) tablet 1 tablet  1 tablet Oral Q2000 Barbie Banner, PA-C   1 tablet at 09/14/22 2130   sodium phosphate (FLEET) 7-19 GM/118ML enema 1 enema  1 enema Rectal Once PRN Barbie Banner, PA-C       sorbitol 70 % solution 30 mL  30 mL Oral Daily PRN Barbie Banner, PA-C         Discharge Medications: Please see discharge summary for a list of discharge medications.  Relevant Imaging Results:  Relevant Lab Results:   Additional Information [id number]; IV abx end on 2/27  Rana Snare, LCSW

## 2022-09-15 NOTE — Progress Notes (Addendum)
Patient ID: Justin Potter, male   DOB: [birth year], 89 y.o.   MRN: [id number]  Per medical team, pt will discharge at a Min-Mod Asst level of care and will not be supervision level of care as needed to return to [place].   SW called pt son Justin Potter to inform on above. He would like a follow-up from attending to discuss further. He would like [facility].   Existing NCPASRR# [id number]   SW sent out SNF referral.   1142-SW left message for Regina/liaison with [facility] to inform on pursuing short-term rehab in SNF at this time, and encouraged to follow-up with pt family to determine next plan of care.   0208- Bed offer by [facility] in hub. SW spoke with Tracy/Admissions with [facility]. Bed offer in place. Able to accept for admission on Monday. Rm# [id number]; Nurse Report# [phone]. Pt has tentative transportation for pick up with [facility] at [place] updated medical team on above.   SW spoke with pt son Justin Potter to discuss above and tentative pick-up time for discharge on Monday. No questions/concerns reported.  Loralee Pacas, MSW, [place] Office: [phone] Cell: [phone] Fax: [phone]

## 2022-09-16 LAB — BASIC METABOLIC PANEL
Anion gap: 11 (ref 5–15)
BUN: 20 mg/dL (ref 8–23)
CO2: 20 mmol/L — ABNORMAL LOW (ref 22–32)
Calcium: 8.6 mg/dL — ABNORMAL LOW (ref 8.9–10.3)
Chloride: 111 mmol/L (ref 98–111)
Creatinine, Ser: 0.83 mg/dL (ref 0.61–1.24)
GFR, Estimated: >60 mL/min (ref >60)
Glucose, Bld: 96 mg/dL (ref 70–99)
Potassium: 3.7 mmol/L (ref 3.5–5.1)
Sodium: 142 mmol/L (ref 135–145)

## 2022-09-16 MED ORDER — QUETIAPINE FUMARATE 50 MG PO TABS
50.0000 mg | ORAL_TABLET | Freq: Every day | ORAL | Status: DC
  Administered 2022-09-16 – 2022-09-17 (×2): 50 mg via ORAL
  Filled 2022-09-16 (×2): qty 1

## 2022-09-16 NOTE — Progress Notes (Signed)
PROGRESS NOTE   Subjective/Complaints:  No acute complaints.  Nursing reports extremely poor sleep overnight, patient did not lie down until approximately 5 AM, even with as needed Seroquel given at approximately 11 PM.  Patient is oriented to self but speaking softly and lethargic on exam.  No gross hallucinations, delusions.  ROS: Limited due to cognitive/behavioral    Objective:   No results found. Recent Labs    09/14/22 0707  WBC 9.3  HGB 10.0*  HCT 31.4*  PLT 411*     Recent Labs    09/15/22 0713 09/16/22 0557  NA 140 142  K 3.5 3.7  CL 109 111  CO2 21* 20*  GLUCOSE 105* 96  BUN 24* 20  CREATININE 0.86 0.83  CALCIUM 8.6* 8.6*      Intake/Output Summary (Last 24 hours) at 09/16/2022 1520 Last data filed at 09/16/2022 1507 Gross per 24 hour  Intake 2696.33 ml  Output --  Net 2696.33 ml          Physical Exam: Vital Signs Blood pressure (!) 162/90, pulse (!) 54, temperature 98 F (36.7 C), resp. rate 16, height '6\' 2"'$  (1.88 m), weight 79.8 kg, SpO2 100 %.  Constitutional: No distress . Vital signs reviewed. HEENT: NCAT, EOMI, oral membranes moist Neck: supple Cardiovascular: RRR without murmur. No JVD    Respiratory/Chest: CTA Bilaterally without wheezes or rales. Normal effort    GI/Abdomen: BS +, non-tender, non-distended Ext: no clubbing, cyanosis, or edema Psych: confused, distracted, hallucinating, non-agitated Neurological: Oriented to self and place, oriented to time by month and year.  Lethargic, falling asleep on exam.  Follows commands intermittently. Speech dysarthric, does not close mouth to enunciate.  Extensor tone in LE's? ?resisting ROM/knee pain. DTR's 1+. No clonus.  Musculoskeletal:     Right lower leg: tr edema.     Left lower leg: tr edema.     Back TTP/ROM   Assessment/Plan: 1. Functional deficits which require 3+ hours per day of interdisciplinary therapy in a  comprehensive inpatient rehab setting. Physiatrist is providing close team supervision and 24 hour management of active medical problems listed below. Physiatrist and rehab team continue to assess barriers to discharge/monitor patient progress toward functional and medical goals  Care Tool:  Bathing    Body parts bathed by patient: Face, Chest, Right arm, Left arm   Body parts bathed by helper: Left lower leg, Right lower leg, Left upper leg, Right upper leg, Buttocks, Abdomen     Bathing assist Assist Level: Dependent - Patient 0%     Upper Body Dressing/Undressing Upper body dressing   What is the patient wearing?: Pull over shirt    Upper body assist Assist Level: Dependent - Patient 0%    Lower Body Dressing/Undressing Lower body dressing      What is the patient wearing?: Incontinence brief, Pants     Lower body assist Assist for lower body dressing: Dependent - Patient 0%     Toileting Toileting    Toileting assist Assist for toileting: Dependent - Patient 0%     Transfers Chair/bed transfer  Transfers assist     Chair/bed transfer assist level: Maximal Assistance - Patient 25 -  49%     Locomotion Ambulation   Ambulation assist   Ambulation activity did not occur: Safety/medical concerns          Walk 10 feet activity   Assist  Walk 10 feet activity did not occur: Safety/medical concerns        Walk 50 feet activity   Assist           Walk 150 feet activity   Assist Walk 150 feet activity did not occur: Safety/medical concerns         Walk 10 feet on uneven surface  activity   Assist Walk 10 feet on uneven surfaces activity did not occur: Safety/medical concerns         Wheelchair     Assist Is the patient using a wheelchair?: Yes Type of Wheelchair: Manual    Wheelchair assist level: Supervision/Verbal cueing Max wheelchair distance: 120 ft    Wheelchair 50 feet with 2 turns activity    Assist         Assist Level: Supervision/Verbal cueing   Wheelchair 150 feet activity     Assist      Assist Level: Minimal Assistance - Patient > 75%   Blood pressure (!) 162/90, pulse (!) 54, temperature 98 F (36.7 C), resp. rate 16, height '6\' 2"'$  (1.88 m), weight 79.8 kg, SpO2 100 %.   Medical Problem List and Plan: 1. Functional deficits secondary to thoracic spine compression fracture             -patient may shower             -ELOS/Goals: 10-14 days Min-ModA            -Continue CIR therapies including PT, OT.  reduced to 15/7 -->SNF 2.  Afib: continue eliquis    3. Left buttock  back and leg pain: Continue Tylenol as needed; tramadol as needed.    2/15- will schedule Tramadol 50 mg q6 hours and cont' tylenol prn.   2/23 have stopped tramadol d/t confusion 4. Delirium: continue Seroquel 25 mg q HS   2/23-now taking seroquel consistently at night--consider reduction to 12.'5mg'$   -UA +, empiric ceftriaxone started 2/20, UCX with 100k proteus--pan sensitive. Continue ceftriaxone for 7 days (thru Monday)  -continue sleep chart  -pt now alert, but still confused    -have stopped tramadol completely, use tylenol for pain  - continue IVF  -dc trazodone and robaxin as they may be contributing to confusion also. Seroquel is most appropriate med for agitation/sleep in this situation  -2-24: Did not sleep at all 2-23, Seroquel increased to 50 mg nightly. 5. Neuropsych/cognition: This patient is not capable of making decisions on his own behalf.   6. Skin/Wound Care: Routine skin care checks   7. Fluids/Electrolytes/Nutrition:     -2/19 mild hypokalemia: added K+ to IVF  - 2/22 eating better but still insufficient   -IVF per #9; continue through 2/24 8: Hypertension: monitor TID and prn             -continue hydralazine 10 mg TID and monitor with ambulation   2/15- BP a little elevated- but hurting more- will monitor for trend before changing meds  2/23--a  hydralazine '5mg'$  tid-- bp  under fair control 9: Prerenal azotemia:      2/23 BUN back down to 24 -maintain IVF continuously -repeat BMET tomorrow -2-24: BUN reduced, creatinine stable.  Continue IVF at current rate. 10: Anemia; Recent drop likely due to [facility]  2/22 10.0  11: Constipation:     -LBM 2/21   LOS: 10 days A FACE TO [place] 09/16/2022, 3:20 PM

## 2022-09-16 NOTE — Progress Notes (Signed)
Occupational Therapy Session Note  Patient Details  Name: Justin Potter MRN: [id number] Date of Birth: 1926-01-25  Today's Date: 09/16/2022 OT Individual Time: 1130-1200 OT Individual Time Calculation (min): 30 min    Short Term Goals: Week 1:  OT Short Term Goal 1 (Week 1): Pt will require mod A for self feeding using low vision and AE with cues OT Short Term Goal 1 - Progress (Week 1): Progressing toward goal OT Short Term Goal 2 (Week 1): Pt will require mod A simple grooming seated sink side with cues OT Short Term Goal 2 - Progress (Week 1): Progressing toward goal OT Short Term Goal 3 (Week 1): Pt will require min a for UB bathing and pullover shirt seated OT Short Term Goal 3 - Progress (Week 1): Progressing toward goal OT Short Term Goal 4 (Week 1): Pt will perform sit to stand in prep for LB self care with mod A and LRAD OT Short Term Goal 4 - Progress (Week 1): Progressing toward goal  Skilled Therapeutic Interventions/Progress Updates:    Patient lying in bed upon arrival verbally rambling and presenting as confused.  The pt was able to demonstrate bed mobility by rolling from supine to side lying to the left and right side with MinA.  The pt was MaxA for donning his pants at bed LOF secondary to challenges with functional reach and bilateral hand manipulation. The patient was able to come supine in bed to  EOB with ModA for washing his face and UB at [facility].  The pt continued to present as confused at the times throughout the   treatment session, but was able to follow some simple commands. The pt was returned to supine in bed at [facility] secondary to falling asleep throughout treatment,   the pt was assist with transferring back from EOB to supine by following vc's and incorporating the bed rails at [facility]. The pt was MaxA for repositioning up in bed.  The call light and bedside table were within reach and the bed alarm was activated.  All additional needs were addressed prior to exiting  the room.   Therapy Documentation Precautions:  Precautions Precautions: Fall, Back Precaution Booklet Issued: Yes (comment) Precaution Comments: reviewed back precautions with pt, Required Braces or Orthoses: Spinal Brace Spinal Brace: Thoracolumbosacral orthotic, Applied in sitting position Restrictions Weight Bearing Restrictions: No  Therapy/Group: Individual Therapy  Yvonne Kendall 09/16/2022, 3:34 PM

## 2022-09-16 NOTE — Progress Notes (Signed)
Physical Therapy Session Note  Patient Details  Name: Justin Potter MRN: [id number] Date of Birth: 1926-01-07  Today's Date: 09/16/2022 PT Individual Time: 0917-1002 PT Individual Time Calculation (min): 45 min   Short Term Goals: Week 1:  PT Short Term Goal 1 (Week 1): Pt will stand with +1 assist PT Short Term Goal 2 (Week 1): Pt will perform squat pivot transfer with mod A PT Short Term Goal 3 (Week 1): Pt will initiate standing transfer training   Skilled Therapeutic Interventions/Progress Updates:   Pt received supine in bed and agreeable to PT at bed level.  PT instructed pt in bed level therex:  Hip.knee flexion/extension x 15  Hip abduction/adduction x 15  Ankle PF/DF x 20  Hip ER/IR through single leg clam shell x 15  Quad set x 15  SAQ x 15  PT performed Bil heel cord stretch 2 x30 sec bil.  Throughtout therex, Pt required AAROM to improve range and attention to ask as well as therapeutic rest break between bouts. Not to fall asleep when not actively engaged in exercise, but aroused easily to continue therapy session.    Pet supine in bed with call bell in reach and all needs met.   Therapy Documentation Precautions:  Precautions Precautions: Fall, Back Precaution Booklet Issued: Yes (comment) Precaution Comments: reviewed back precautions with pt, Required Braces or Orthoses: Spinal Brace Spinal Brace: Thoracolumbosacral orthotic, Applied in sitting position Restrictions Weight Bearing Restrictions: No  Pain:  Denies at rest     Therapy/Group: Individual Therapy  Lorie Phenix 09/16/2022, 10:02 AM

## 2022-09-16 NOTE — Plan of Care (Signed)
  Problem: Consults Goal: RH GENERAL PATIENT EDUCATION Description: See Patient Education module for education specifics. Outcome: Progressing   Problem: RH BOWEL ELIMINATION Goal: RH STG MANAGE BOWEL WITH ASSISTANCE Description: STG Manage Bowel with mod Assistance. Outcome: Progressing Goal: RH STG MANAGE BOWEL W/MEDICATION W/ASSISTANCE Description: STG Manage Bowel with Medication with min Assistance. Outcome: Progressing   Problem: RH BLADDER ELIMINATION Goal: RH STG MANAGE BLADDER WITH ASSISTANCE Description: STG Manage Bladder With mod Assistance Outcome: Progressing   Problem: RH SKIN INTEGRITY Goal: RH STG SKIN FREE OF INFECTION/BREAKDOWN Description: Skin will be free of any infection/breakdown with min assist Outcome: Progressing Goal: RH STG MAINTAIN SKIN INTEGRITY WITH ASSISTANCE Description: STG Maintain Skin Integrity With min Assistance. Outcome: Progressing   Problem: RH SAFETY Goal: RH STG ADHERE TO SAFETY PRECAUTIONS W/ASSISTANCE/DEVICE Description: STG Adhere to Safety Precautions With cueing Assistance/Device. Outcome: Progressing   Problem: RH PAIN MANAGEMENT Goal: RH STG PAIN MANAGED AT OR BELOW PT'S PAIN GOAL Description: Pain will be managed at 4 out of 10 on pain scale with PRN medications min assist Outcome: Progressing   Problem: RH KNOWLEDGE DEFICIT GENERAL Goal: RH STG INCREASE KNOWLEDGE OF SELF CARE AFTER HOSPITALIZATION Description: Patient/caregiver will be able to manage medications, back brace, and fall precautions from nursing education and nursing handouts independently  Outcome: Progressing   Problem: RH Vision Goal: RH LTG Vision (Specify) Outcome: Progressing

## 2022-09-16 NOTE — Progress Notes (Signed)
Occupational Therapy Session Note  Patient Details  Name: Justin Potter MRN: [id number] Date of Birth: 1925-09-17  Today's Date: 09/16/2022 OT Individual Time: 1325-1424 OT Individual Time Calculation (min): 59 min    Short Term Goals: Week 2:  OT Short Term Goal 1 (Week 2): Pt will require mod A for self feeding using low vision and AE with cues OT Short Term Goal 2 (Week 2): Pt will require min a for UB bathing and pullover shirt seated OT Short Term Goal 3 (Week 2): Pt will perform sit to stand in prep for LB self care with mod A and LRAD OT Short Term Goal 4 (Week 2): Pt will perform sit to stand in prep for LB self care with mod A and LRAD  Skilled Therapeutic Interventions/Progress Updates:  Pt received resting in bed for skilled OT session with focus on simple ADL re-training and functional cognition/reminiscing. Pt agreeable to bed-level interventions, demonstrating overall pleasant mood, with slightly improved mental status since last session with this therapist. Pt with no reports of pain, stating "Im doing fine. . [name]."  Pt re-oriented to location and date. Pt continuing to say "its a super time", "we're going to have a super time," receptive to oral care, completing with total A due to decreased initiation and AMS. Pt able to swirl water in mouth, spit, and clean his face with setup.  Pt then listens to multiple songs, guessing the band's name between a choice of two bands. Pt correctly answers 2/4 bands, demonstrating improved affect with music played 40s era.   Pt's call-bell adapted for improved access to nursing staff/care.   Pt remained resting in bed with all immediate needs met at end of session. Pt continues to be appropriate for skilled OT intervention to promote further functional independence.   Therapy Documentation Precautions:  Precautions Precautions: Fall, Back Precaution Booklet Issued: Yes (comment) Precaution Comments: reviewed back precautions  with pt, Required Braces or Orthoses: Spinal Brace Spinal Brace: Thoracolumbosacral orthotic, Applied in sitting position Restrictions Weight Bearing Restrictions: No  Therapy/Group: Individual Therapy  Maudie Mercury, OTR/L, MSOT  09/16/2022, 5:32 AM

## 2022-09-17 LAB — COMPREHENSIVE METABOLIC PANEL
ALT: 19 U/L (ref 0–44)
AST: 28 U/L (ref 15–41)
Albumin: 2.6 g/dL — ABNORMAL LOW (ref 3.5–5.0)
Alkaline Phosphatase: 60 U/L (ref 38–126)
Anion gap: 12 (ref 5–15)
BUN: 13 mg/dL (ref 8–23)
CO2: 19 mmol/L — ABNORMAL LOW (ref 22–32)
Calcium: 8.5 mg/dL — ABNORMAL LOW (ref 8.9–10.3)
Chloride: 108 mmol/L (ref 98–111)
Creatinine, Ser: 0.72 mg/dL (ref 0.61–1.24)
GFR, Estimated: >60 mL/min (ref >60)
Glucose, Bld: 95 mg/dL (ref 70–99)
Potassium: 3.9 mmol/L (ref 3.5–5.1)
Sodium: 139 mmol/L (ref 135–145)
Total Bilirubin: 0.7 mg/dL (ref 0.3–1.2)
Total Protein: 5.2 g/dL — ABNORMAL LOW (ref 6.5–8.1)

## 2022-09-17 LAB — CBC WITH DIFFERENTIAL/PLATELET
Abs Immature Granulocytes: 0.03 10*3/uL (ref 0.00–0.07)
Basophils Absolute: 0.1 10*3/uL (ref 0.0–0.1)
Basophils Relative: 1 %
Eosinophils Absolute: 0.2 10*3/uL (ref 0.0–0.5)
Eosinophils Relative: 2 %
HCT: 32.2 % — ABNORMAL LOW (ref 39.0–52.0)
Hemoglobin: 10.4 g/dL — ABNORMAL LOW (ref 13.0–17.0)
Immature Granulocytes: 0 %
Lymphocytes Relative: 12 %
Lymphs Abs: 1.1 10*3/uL (ref 0.7–4.0)
MCH: 29.5 pg (ref 26.0–34.0)
MCHC: 32.3 g/dL (ref 30.0–36.0)
MCV: 91.2 fL (ref 80.0–100.0)
Monocytes Absolute: 0.7 10*3/uL (ref 0.1–1.0)
Monocytes Relative: 7 %
Neutro Abs: 7.5 10*3/uL (ref 1.7–7.7)
Neutrophils Relative %: 78 %
Platelets: 411 10*3/uL — ABNORMAL HIGH (ref 150–400)
RBC: 3.53 MIL/uL — ABNORMAL LOW (ref 4.22–5.81)
RDW: 14.4 % (ref 11.5–15.5)
WBC: 9.5 10*3/uL (ref 4.0–10.5)
nRBC: 0 % (ref 0.0–0.2)

## 2022-09-17 NOTE — Progress Notes (Signed)
Physical Therapy Session Note  Patient Details  Name: Justin Potter MRN: [id number] Date of Birth: 05/03/1926  Today's Date: 09/17/2022 PT Missed Time: 45 Minutes Missed Time Reason: Other (Comment) (Cognitive unable to particpate in session)  Short Term Goals: Week 1:  PT Short Term Goal 1 (Week 1): Pt will stand with +1 assist PT Short Term Goal 2 (Week 1): Pt will perform squat pivot transfer with mod A PT Short Term Goal 3 (Week 1): Pt will initiate standing transfer training  Skilled Therapeutic Interventions/Progress Updates:      Therapy Documentation Precautions:  Precautions Precautions: Fall, Back Precaution Booklet Issued: Yes (comment) Precaution Comments: reviewed back precautions with pt, Required Braces or Orthoses: Spinal Brace Spinal Brace: Thoracolumbosacral orthotic, Applied in sitting position Restrictions Weight Bearing Restrictions: No General: PT Amount of Missed Time (min): 45 Minutes PT Missed Treatment Reason: Other (Comment) (Cognitive unable to particpate in session)  Pt cognitively unable to participate and missed 45 minutes of skilled PT.    Therapy/Group: Individual Therapy  Verl Dicker Verl Dicker PT, DPT  09/17/2022, 12:27 PM

## 2022-09-17 NOTE — Progress Notes (Signed)
Occupational Therapy Session Note  Patient Details  Name: EHSAN GELLERT MRN: [id number] Date of Birth: [birth year]  Today's Date: 09/17/2022 OT Individual Time: 0800-0900 OT Individual Time Calculation (min): 60 min    Short Term Goals: Week 1:  OT Short Term Goal 1 (Week 1): Pt will require mod A for self feeding using low vision and AE with cues OT Short Term Goal 1 - Progress (Week 1): Progressing toward goal OT Short Term Goal 2 (Week 1): Pt will require mod A simple grooming seated sink side with cues OT Short Term Goal 2 - Progress (Week 1): Progressing toward goal OT Short Term Goal 3 (Week 1): Pt will require min a for UB bathing and pullover shirt seated OT Short Term Goal 3 - Progress (Week 1): Progressing toward goal OT Short Term Goal 4 (Week 1): Pt will perform sit to stand in prep for LB self care with mod A and LRAD OT Short Term Goal 4 - Progress (Week 1): Progressing toward goal  Skilled Therapeutic Interventions/Progress Updates:    Patient lying in bed upon arrival, I introduced myself and indicated that I was here to assist the patient with getting bathe, dressed, and up in the recliner.  The pt was in agreement and was able to transfer from supine in bed to EOB with ModA.  The pt was ModA with attempting to doff his over head shirt, however, we were unsuccessful secondary IV placement, so nursing was contacted.  The pt was MaxA with LB task perform for bathing/ dressing.  The pt became agitated and began to become very frustrated and refused my assistance.  I attempt further to explain the nature of the visit, as much , what I was attempting to help him with.  He wouldn't accept my help.  I reached out to the [facility] on the unit and we were able to return him to supine in bed with MaxA.  The charge nurse and lead therapy indicated that she would accommodate the patient to see what would work best.   2nd OT Treatment Session:  Patient unable to participate in skilled OT  session secondary to inability to demonstrate effective carryover.   Therapy Documentation Precautions:  Precautions Precautions: Fall, Back Precaution Booklet Issued: Yes (comment) Precaution Comments: reviewed back precautions with pt, Required Braces or Orthoses: Spinal Brace Spinal Brace: Thoracolumbosacral orthotic, Applied in sitting position Restrictions Weight Bearing Restrictions: No   Therapy/Group: Individual Therapy  Yvonne Kendall 09/17/2022, 3:24 PM

## 2022-09-17 NOTE — Progress Notes (Addendum)
PROGRESS NOTE   Subjective/Complaints:  No acute complaints.  Improved sleep overnight per nursing but no sleep log recorded. Patient today perseverating on his glaucoma causing diffuse body pain, and waiting for someone from a religious service to come in. Remains pleasantly confused, improved engagement from yesterday.   ROS: Limited due to cognitive/behavioral    Objective:   No results found. No results for input(s): "WBC", "HGB", "HCT", "PLT" in the last 72 hours.   Recent Labs    09/15/22 0713 09/16/22 0557  NA 140 142  K 3.5 3.7  CL 109 111  CO2 21* 20*  GLUCOSE 105* 96  BUN 24* 20  CREATININE 0.86 0.83  CALCIUM 8.6* 8.6*      Intake/Output Summary (Last 24 hours) at 09/17/2022 1413 Last data filed at 09/17/2022 1330 Gross per 24 hour  Intake 2703.33 ml  Output --  Net 2703.33 ml          Physical Exam: Vital Signs Blood pressure (!) 155/78, pulse 60, temperature 97.8 F (36.6 C), resp. rate 16, height '6\' 2"'$  (1.88 m), weight 79.8 kg, SpO2 100 %.  Constitutional: No distress . Vital signs reviewed. HEENT: NCAT, EOMI, oral membranes moist Neck: supple Cardiovascular: RRR without murmur. No JVD    Respiratory/Chest: CTA Bilaterally without wheezes or rales. Normal effort    GI/Abdomen: BS +, non-tender, non-distended Ext: no clubbing, cyanosis, or edema Psych: confused, distracted, hallucinating, non-agitated Neurological: Oriented to self , month and year; not place or day.  Much more alert than yesterday. Follows commands intermittently. Speech dysarthric, but improved.  Extensor tone in LE's? ?resisting ROM/knee pain. DTR's 1+. No clonus.  Musculoskeletal:     Right lower leg: tr edema.     Left lower leg: tr edema.     Back TTP/ROM   Assessment/Plan: 1. Functional deficits which require 3+ hours per day of interdisciplinary therapy in a comprehensive inpatient rehab setting. Physiatrist is  providing close team supervision and 24 hour management of active medical problems listed below. Physiatrist and rehab team continue to assess barriers to discharge/monitor patient progress toward functional and medical goals  Care Tool:  Bathing    Body parts bathed by patient: Face, Chest, Right arm, Left arm   Body parts bathed by helper: Left lower leg, Right lower leg, Left upper leg, Right upper leg, Buttocks, Abdomen     Bathing assist Assist Level: Dependent - Patient 0%     Upper Body Dressing/Undressing Upper body dressing   What is the patient wearing?: Pull over shirt    Upper body assist Assist Level: Dependent - Patient 0%    Lower Body Dressing/Undressing Lower body dressing      What is the patient wearing?: Incontinence brief, Pants     Lower body assist Assist for lower body dressing: Dependent - Patient 0%     Toileting Toileting    Toileting assist Assist for toileting: Dependent - Patient 0%     Transfers Chair/bed transfer  Transfers assist     Chair/bed transfer assist level: Maximal Assistance - Patient 25 - 49%     Locomotion Ambulation   Ambulation assist   Ambulation activity did not occur:  Safety/medical concerns          Walk 10 feet activity   Assist  Walk 10 feet activity did not occur: Safety/medical concerns        Walk 50 feet activity   Assist           Walk 150 feet activity   Assist Walk 150 feet activity did not occur: Safety/medical concerns         Walk 10 feet on uneven surface  activity   Assist Walk 10 feet on uneven surfaces activity did not occur: Safety/medical concerns         Wheelchair     Assist Is the patient using a wheelchair?: Yes Type of Wheelchair: Manual    Wheelchair assist level: Supervision/Verbal cueing Max wheelchair distance: 120 ft    Wheelchair 50 feet with 2 turns activity    Assist        Assist Level: Supervision/Verbal cueing    Wheelchair 150 feet activity     Assist      Assist Level: Minimal Assistance - Patient > 75%   Blood pressure (!) 155/78, pulse 60, temperature 97.8 F (36.6 C), resp. rate 16, height '6\' 2"'$  (1.88 m), weight 79.8 kg, SpO2 100 %.   Medical Problem List and Plan: 1. Functional deficits secondary to thoracic spine compression fracture             -patient may shower             -ELOS/Goals: 10-14 days Min-ModA            -Continue CIR therapies including PT, OT.  reduced to 15/7 -->SNF 2.  Afib: continue eliquis    3. Left buttock  back and leg pain: Continue Tylenol as needed; tramadol as needed.    2/15- will schedule Tramadol 50 mg q6 hours and cont' tylenol prn.   2/23 have stopped tramadol d/t confusion 4. Delirium: continue Seroquel 25 mg q HS   2/23-now taking seroquel consistently at night--consider reduction to 12.'5mg'$   -UA +, empiric ceftriaxone started 2/20, UCX with 100k proteus--pan sensitive. Continue ceftriaxone for 7 days (thru Monday)  -continue sleep chart  -pt now alert, but still confused    -have stopped tramadol completely, use tylenol for pain  - continue IVF  -dc trazodone and robaxin as they may be contributing to confusion also. Seroquel is most appropriate med for agitation/sleep in this situation  -2-24: Did not sleep at all 2/23, Seroquel increased to 50 mg nightly.   - 2/25: Sleep improved per nursing on increased seroquel, no PRN needed, patient remains confused but pleasant  5. Neuropsych/cognition: This patient is not capable of making decisions on his own behalf.   6. Skin/Wound Care: Routine skin care checks   7. Fluids/Electrolytes/Nutrition:     -2/19 mild hypokalemia: added K+ to IVF  - 2/22 eating better but still insufficient   -IVF per #9; continue through 2/24   - 2/25: eating 0-40% meals, poor fluid intakes, continue IVF and repeat labs today  8: Hypertension: monitor TID and prn             -continue hydralazine 10 mg TID and  monitor with ambulation   2/15- BP a little elevated- but hurting more- will monitor for trend before changing meds  2/23--a  hydralazine '5mg'$  tid-- bp under fair control 9: Prerenal azotemia:      2/23 BUN back down to 24 -maintain IVF continuously -repeat BMET tomorrow -2-24: BUN reduced, creatinine  stable.  Continue IVF at current rate. - 2/25: repeat labs as above 10: Anemia; Recent drop likely due to hematoma--stable              2/22 10.0  11: Constipation:     -LBM 2/21   LOS: 11 days A FACE TO [place] 09/17/2022, 2:13 PM

## 2022-09-18 DIAGNOSIS — I4891 Unspecified atrial fibrillation: Secondary | ICD-10-CM | POA: Diagnosis not present

## 2022-09-18 DIAGNOSIS — R601 Generalized edema: Secondary | ICD-10-CM | POA: Diagnosis not present

## 2022-09-18 DIAGNOSIS — Z743 Need for continuous supervision: Secondary | ICD-10-CM | POA: Diagnosis not present

## 2022-09-18 DIAGNOSIS — F05 Delirium due to known physiological condition: Secondary | ICD-10-CM | POA: Diagnosis not present

## 2022-09-18 DIAGNOSIS — R627 Adult failure to thrive: Secondary | ICD-10-CM | POA: Diagnosis not present

## 2022-09-18 DIAGNOSIS — Z87440 Personal history of urinary (tract) infections: Secondary | ICD-10-CM | POA: Diagnosis not present

## 2022-09-18 DIAGNOSIS — R630 Anorexia: Secondary | ICD-10-CM | POA: Diagnosis not present

## 2022-09-18 DIAGNOSIS — B964 Proteus (mirabilis) (morganii) as the cause of diseases classified elsewhere: Secondary | ICD-10-CM | POA: Diagnosis not present

## 2022-09-18 DIAGNOSIS — R5381 Other malaise: Secondary | ICD-10-CM | POA: Diagnosis not present

## 2022-09-18 DIAGNOSIS — S22000A Wedge compression fracture of unspecified thoracic vertebra, initial encounter for closed fracture: Secondary | ICD-10-CM | POA: Diagnosis not present

## 2022-09-18 DIAGNOSIS — I10 Essential (primary) hypertension: Secondary | ICD-10-CM | POA: Diagnosis not present

## 2022-09-18 DIAGNOSIS — I4821 Permanent atrial fibrillation: Secondary | ICD-10-CM | POA: Diagnosis not present

## 2022-09-18 DIAGNOSIS — S0003XD Contusion of scalp, subsequent encounter: Secondary | ICD-10-CM | POA: Diagnosis not present

## 2022-09-18 DIAGNOSIS — M546 Pain in thoracic spine: Secondary | ICD-10-CM | POA: Diagnosis not present

## 2022-09-18 DIAGNOSIS — Z66 Do not resuscitate: Secondary | ICD-10-CM | POA: Diagnosis not present

## 2022-09-18 DIAGNOSIS — R54 Age-related physical debility: Secondary | ICD-10-CM | POA: Diagnosis not present

## 2022-09-18 DIAGNOSIS — I959 Hypotension, unspecified: Secondary | ICD-10-CM | POA: Diagnosis not present

## 2022-09-18 DIAGNOSIS — D649 Anemia, unspecified: Secondary | ICD-10-CM

## 2022-09-18 DIAGNOSIS — K5900 Constipation, unspecified: Secondary | ICD-10-CM | POA: Diagnosis not present

## 2022-09-18 DIAGNOSIS — S22059D Unspecified fracture of T5-T6 vertebra, subsequent encounter for fracture with routine healing: Secondary | ICD-10-CM | POA: Diagnosis not present

## 2022-09-18 DIAGNOSIS — S22039D Unspecified fracture of third thoracic vertebra, subsequent encounter for fracture with routine healing: Secondary | ICD-10-CM | POA: Diagnosis not present

## 2022-09-18 DIAGNOSIS — D62 Acute posthemorrhagic anemia: Secondary | ICD-10-CM | POA: Diagnosis not present

## 2022-09-18 DIAGNOSIS — M7981 Nontraumatic hematoma of soft tissue: Secondary | ICD-10-CM | POA: Diagnosis not present

## 2022-09-18 DIAGNOSIS — R410 Disorientation, unspecified: Secondary | ICD-10-CM | POA: Diagnosis not present

## 2022-09-18 DIAGNOSIS — R7989 Other specified abnormal findings of blood chemistry: Secondary | ICD-10-CM | POA: Diagnosis not present

## 2022-09-18 DIAGNOSIS — R471 Dysarthria and anarthria: Secondary | ICD-10-CM | POA: Diagnosis not present

## 2022-09-18 DIAGNOSIS — R296 Repeated falls: Secondary | ICD-10-CM | POA: Diagnosis not present

## 2022-09-18 DIAGNOSIS — R4181 Age-related cognitive decline: Secondary | ICD-10-CM | POA: Diagnosis not present

## 2022-09-18 DIAGNOSIS — F29 Unspecified psychosis not due to a substance or known physiological condition: Secondary | ICD-10-CM | POA: Diagnosis not present

## 2022-09-18 DIAGNOSIS — R443 Hallucinations, unspecified: Secondary | ICD-10-CM | POA: Diagnosis not present

## 2022-09-18 LAB — BASIC METABOLIC PANEL
Anion gap: 10 (ref 5–15)
BUN: 14 mg/dL (ref 8–23)
CO2: 20 mmol/L — ABNORMAL LOW (ref 22–32)
Calcium: 8.5 mg/dL — ABNORMAL LOW (ref 8.9–10.3)
Chloride: 111 mmol/L (ref 98–111)
Creatinine, Ser: 0.71 mg/dL (ref 0.61–1.24)
GFR, Estimated: >60 mL/min (ref >60)
Glucose, Bld: 92 mg/dL (ref 70–99)
Potassium: 4 mmol/L (ref 3.5–5.1)
Sodium: 141 mmol/L (ref 135–145)

## 2022-09-18 LAB — CBC
HCT: 32.6 % — ABNORMAL LOW (ref 39.0–52.0)
Hemoglobin: 10.4 g/dL — ABNORMAL LOW (ref 13.0–17.0)
MCH: 29.6 pg (ref 26.0–34.0)
MCHC: 31.9 g/dL (ref 30.0–36.0)
MCV: 92.9 fL (ref 80.0–100.0)
Platelets: 385 10*3/uL (ref 150–400)
RBC: 3.51 MIL/uL — ABNORMAL LOW (ref 4.22–5.81)
RDW: 14.5 % (ref 11.5–15.5)
WBC: 10 10*3/uL (ref 4.0–10.5)
nRBC: 0 % (ref 0.0–0.2)

## 2022-09-18 MED ORDER — HYDRALAZINE HCL 25 MG PO TABS: 25.0000 mg | ORAL_TABLET | Freq: Three times a day (TID) | ORAL | Status: DC

## 2022-09-18 MED ORDER — SENNOSIDES-DOCUSATE SODIUM 8.6-50 MG PO TABS: 1.0000 | ORAL_TABLET | Freq: Every day | ORAL | Status: DC

## 2022-09-18 MED ORDER — QUETIAPINE FUMARATE 50 MG PO TABS: 50.0000 mg | ORAL_TABLET | Freq: Every day | ORAL | Status: DC

## 2022-09-18 MED ORDER — POTASSIUM CHLORIDE IN NACL 20-0.45 MEQ/L-% IV SOLN: 50.0000 mL/h | INTRAVENOUS | Status: DC

## 2022-09-18 MED ORDER — LIDOCAINE 5 % EX PTCH: 2.0000 | MEDICATED_PATCH | CUTANEOUS | 0 refills | Status: DC

## 2022-09-18 MED ORDER — QUETIAPINE FUMARATE 25 MG PO TABS: 12.5000 mg | ORAL_TABLET | Freq: Every day | ORAL | Status: DC | PRN

## 2022-09-18 NOTE — Progress Notes (Signed)
Pt d/ced via [employer]. Report called to [facility], nurse Caryl Pina. Advised that pt d/ced with IV in LFA. Belongings taken with pts son. Back Brace left with pt.

## 2022-09-18 NOTE — Progress Notes (Addendum)
PROGRESS NOTE   Subjective/Complaints:  No acute events.  Continued to be restless and pleasantly confused overnight.  Reports to report body pain but unable to specify. Plan for DC to SNF today.   ROS: Limited due to cognitive/behavioral    Objective:   No results found. Recent Labs    09/17/22 1447 09/18/22 0706  WBC 9.5 10.0  HGB 10.4* 10.4*  HCT 32.2* 32.6*  PLT 411* 385     Recent Labs    09/17/22 1447 09/18/22 0706  NA 139 141  K 3.9 4.0  CL 108 111  CO2 19* 20*  GLUCOSE 95 92  BUN 13 14  CREATININE 0.72 0.71  CALCIUM 8.5* 8.5*      Intake/Output Summary (Last 24 hours) at 09/18/2022 1107 Last data filed at 09/17/2022 1748 Gross per 24 hour  Intake 240 ml  Output --  Net 240 ml          Physical Exam: Vital Signs Blood pressure (!) 141/106, pulse 68, temperature 98 F (36.7 C), resp. rate 17, height '6\' 2"'$  (1.88 m), weight 79.8 kg, SpO2 99 %.  Constitutional: No distress . Vital signs reviewed. HEENT: NCAT, conjugate gaze, oral membranes moist Neck: supple Cardiovascular: RRR without murmur. No JVD    Respiratory/Chest: CTA Bilaterally without wheezes or rales. Normal effort    GI/Abdomen: BS +, non-tender, non-distended Ext: no clubbing, cyanosis, or edema Psych: confused, distracted, hallucinating, non-agitated Neurological: Oriented to self , month and year; not place or day.  Alert and awake. Follows simple commands intermittently. Speech dysarthric, but improved.  Extensor tone in LE's? ?resisting ROM/knee pain. DTR's 1+. No clonus.  Musculoskeletal:     Right lower leg: tr edema.     Left lower leg: tr edema.     Back TTP/ROM   Assessment/Plan: 1. Functional deficits which require 3+ hours per day of interdisciplinary therapy in a comprehensive inpatient rehab setting. Physiatrist is providing close team supervision and 24 hour management of active medical problems listed  below. Physiatrist and rehab team continue to assess barriers to discharge/monitor patient progress toward functional and medical goals  Care Tool:  Bathing    Body parts bathed by patient: Face, Chest, Right arm, Left arm   Body parts bathed by helper: Left lower leg, Right lower leg, Left upper leg, Right upper leg, Buttocks, Abdomen     Bathing assist Assist Level: Dependent - Patient 0%     Upper Body Dressing/Undressing Upper body dressing   What is the patient wearing?: Pull over shirt    Upper body assist Assist Level: Dependent - Patient 0%    Lower Body Dressing/Undressing Lower body dressing      What is the patient wearing?: Incontinence brief, Pants     Lower body assist Assist for lower body dressing: Dependent - Patient 0%     Toileting Toileting    Toileting assist Assist for toileting: Dependent - Patient 0%     Transfers Chair/bed transfer  Transfers assist     Chair/bed transfer assist level: Maximal Assistance - Patient 25 - 49%     Locomotion Ambulation   Ambulation assist   Ambulation activity did not occur:  Safety/medical concerns          Walk 10 feet activity   Assist  Walk 10 feet activity did not occur: Safety/medical concerns        Walk 50 feet activity   Assist           Walk 150 feet activity   Assist Walk 150 feet activity did not occur: Safety/medical concerns         Walk 10 feet on uneven surface  activity   Assist Walk 10 feet on uneven surfaces activity did not occur: Safety/medical concerns         Wheelchair     Assist Is the patient using a wheelchair?: Yes Type of Wheelchair: Manual    Wheelchair assist level: Supervision/Verbal cueing Max wheelchair distance: 120 ft    Wheelchair 50 feet with 2 turns activity    Assist        Assist Level: Supervision/Verbal cueing   Wheelchair 150 feet activity     Assist      Assist Level: Minimal Assistance -  Patient > 75%   Blood pressure (!) 141/106, pulse 68, temperature 98 F (36.7 C), resp. rate 17, height '6\' 2"'$  (1.88 m), weight 79.8 kg, SpO2 99 %.   Medical Problem List and Plan: 1. Functional deficits secondary to thoracic spine compression fracture             -patient may shower             -ELOS/Goals: 10-14 days Min-ModA            -Continue CIR therapies including PT, OT.  reduced to 15/7 -->SNF  -SNF discharge planned for today 2.  Afib: continue eliquis    3. Left buttock  back and leg pain: Continue Tylenol as needed; tramadol as needed.    2/15- will schedule Tramadol 50 mg q6 hours and cont' tylenol prn.   2/23 have stopped tramadol d/t confusion 4. Delirium: continue Seroquel 25 mg q HS   2/23-now taking seroquel consistently at night--consider reduction to 12.'5mg'$   -UA +, empiric ceftriaxone started 2/20, UCX with 100k proteus--pan sensitive. Continue ceftriaxone for 7 days (thru Monday)  -continue sleep chart  -pt now alert, but still confused    -have stopped tramadol completely, use tylenol for pain  - continue IVF  -dc trazodone and robaxin as they may be contributing to confusion also. Seroquel is most appropriate med for agitation/sleep in this situation  -2-24: Did not sleep at all 2/23, Seroquel increased to 50 mg nightly.   - 2/25: Sleep improved per nursing on increased seroquel, no PRN needed, patient remains confused but pleasant  -2/26 continue seroquel current dose  5. Neuropsych/cognition: This patient is not capable of making decisions on his own behalf.   6. Skin/Wound Care: Routine skin care checks   7. Fluids/Electrolytes/Nutrition:     -2/19 mild hypokalemia: added K+ to IVF  - 2/22 eating better but still insufficient   -IVF per #9; continue through 2/24   - 2/25: eating 0-40% meals, poor fluid intakes, continue IVF and repeat labs today  8: Hypertension: monitor TID and prn             -continue hydralazine 10 mg TID and monitor with  ambulation   2/15- BP a little elevated- but hurting more- will monitor for trend before changing meds  2/23--a  hydralazine '5mg'$  tid-- bp under fair control  2/26 fair control,  recommend continue to monitor trend  09/18/2022    5:01 AM 09/17/2022    7:37 PM 09/17/2022    1:36 PM  Vitals with BMI  Systolic Q000111Q Q000111Q [id number]  Diastolic A999333 82 78  Pulse 68 59 60    9: Prerenal azotemia:      2/23 BUN back down to 24 -maintain IVF continuously -repeat BMET tomorrow -2-24: BUN reduced, creatinine stable.  Continue IVF at current rate. - 2/26 stable, wound continue IVF due to poor intake for now, can stop if adequate intake 10: Anemia; Recent drop likely due to [facility]              2/26 HGB stable  10.4  11: Constipation:     -LBM 2/26 today   LOS: 12 days A FACE TO FACE EVALUATION WAS PERFORMED  Jennye Boroughs 09/18/2022, 11:07 AM

## 2022-09-18 NOTE — Progress Notes (Signed)
Inpatient Rehabilitation Care Coordinator Discharge Note   Patient Details  Name: TYLLER SARTINI MRN: [id number] Date of Birth: 1926/03/01   Discharge location: GOING TO [facility] FOR MORE REHAB  Length of Stay: 12 DAYS  Discharge activity level: MIN-MOD LEVEL  Home/community participation: ACTIVE  Patient response [id number] Literacy - How often do you need to have someone help you when you read instructions, pamphlets, or other written material from your doctor or pharmacy?: Never  Patient response [id number] Isolation - How often do you feel lonely or isolated from those around you?: Never  Services provided included: MD, RD, PT, OT, SLP, RN, CM, TR, Pharmacy, SW  Financial Services:  Financial Services Utilized: Medicare    Choices offered to/list presented to: SON AND PT  Follow-up services arranged:  Other (Comment) (SNF)           Patient response to transportation need: Is the patient able to respond to transportation needs?: Yes In the past 12 months, has lack of transportation kept you from medical appointments or from getting medications?: No In the past 12 months, has lack of transportation kept you from meetings, work, or from getting things needed for daily living?: No    Comments (or additional information):BOTH IN AGREEMENT WITH GOING TO [facility] FOR MORE REHAB BEFORE RETURNING TO [facility].   Patient/Family verbalized understanding of follow-up arrangements:  Yes  Individual responsible for coordination of the follow-up plan: BOB-SON [phone]  Confirmed correct DME delivered: Elease Hashimoto 09/18/2022    Leilyn Frayre, Gardiner Rhyme

## 2022-09-18 NOTE — Progress Notes (Signed)
Occupational Therapy Discharge Summary  Patient Details  Name: Justin Potter MRN: [id number] Date of Birth: March 30, 1926  Date of Discharge from OT service:September 18, 2022  Today's Date: 09/18/2022 OT Individual Time: [id number] OT Individual Time Calculation (min): 50 min   Reasons goals not met: Patient did not meet any long-term goals set during IP rehab stay due to decreasing/fluctuating cognitive functioning impacting his ability to effectively participate in OT sessions. Pt currently requires Max-total A to complete BADLs and has been using lift equipment, including sara stedy and maxi move, for functional mobility.   Recommendation:  Patient will benefit from ongoing skilled OT services in skilled nursing facility setting to continue to advance functional skills in the area of BADL and overall promotion of quality of life.   Reasons for discharge: lack of progress toward goals  Patient/family agrees with progress made and goals achieved: Yes  OT Discharge Precautions/Restrictions  Precautions Precautions: Fall;Back Required Braces or Orthoses: Spinal Brace Spinal Brace: Thoracolumbosacral orthotic;Applied in sitting position Restrictions Weight Bearing Restrictions: No Pain Pain Assessment Pain Scale: Faces Faces Pain Scale: Hurts a little bit Pain Type: Acute pain Pain Location: Chest Pain Orientation: Right ADL ADL Eating: Maximal assistance Where Assessed-Eating: Chair Grooming: Maximal assistance Where Assessed-Grooming: Bed level Upper Body Bathing: Dependent Where Assessed-Upper Body Bathing: Bed level Lower Body Bathing: Dependent Where Assessed-Lower Body Bathing: Bed level Upper Body Dressing: Maximal assistance Where Assessed-Upper Body Dressing: Bed level Lower Body Dressing: Dependent Where Assessed-Lower Body Dressing: Bed level Toileting: Dependent Where Assessed-Toileting: Bed level Toilet Transfer: Unable to assess Toilet Transfer Method:   (STEDY) Toilet Transfer Equipment: Grab bars, Bedside commode Tub/Shower Transfer: Unable to assess Tub/Shower Transfer Method: Unable to assess Social research officer, government: Unable to assess Social research officer, government Method: Other (comment) (STEDY) Walk-In Engineer, site: Gaffer Baseline Vision/History: 6 Macular Degeneration Patient Visual Report: No change from baseline Vision Assessment?: Vision impaired- to be further tested in functional context Additional Comments: Severely impaired acuity impacting all aspects of ADLs and mobility. Perception  Perception: Within Functional Limits Praxis Praxis: Intact Cognition Cognition Overall Cognitive Status: Impaired/Different from baseline Arousal/Alertness: Lethargic Orientation Level: Person Memory: Impaired Memory Impairment: Decreased recall of new information;Decreased short term memory Awareness: Impaired Problem Solving: Impaired Safety/Judgment: Impaired Brief Interview for Mental Status (BIMS) Repetition of Three Words (First Attempt): No answer Temporal Orientation: Year: No answer Temporal Orientation: Month: No answer Temporal Orientation: Day: No answer Recall: "Sock": No answer Recall: "Blue": No answer Recall: "Bed": No answer BIMS Summary Score: 99 Sensation Sensation Light Touch: Appears Intact Hot/Cold: Appears Intact Proprioception: Appears Intact Coordination Gross Motor Movements are Fluid and Coordinated: No Fine Motor Movements are Fluid and Coordinated: No Coordination and Movement Description: Ataxia and generalized weakness. Finger Nose Finger Test: impaired Bly Heel Shin Test: NT Motor  Motor Motor: Other (comment) Motor - Skilled Clinical Observations: generalized weakness and ataxia Motor - Discharge Observations: Ataxia and generalized weakness. Mobility  Bed Mobility Bed Mobility: Rolling Right;Rolling Left;Supine to Sit;Sit to Supine Rolling Right: Maximal Assistance -  Patient 25-49% Rolling Left: Maximal Assistance - Patient 25-49% Supine to Sit: Maximal Assistance - Patient - Patient 25-49% Sit to Supine: 2 Helpers Transfers Sit to Stand: Total Assistance - Patient < 25%  Trunk/Postural Assessment  Cervical Assessment Cervical Assessment: Within Functional Limits Thoracic Assessment Thoracic Assessment: Exceptions to WFL (TLSO) Lumbar Assessment Lumbar Assessment: Exceptions to [facility] (posterior pelvic tilt) Postural Control Postural Control: Deficits on evaluation Trunk Control: Grossly impaired Righting Reactions: Delayed Protective  Responses: Delayed  Balance Balance Balance Assessed: Yes Static Sitting Balance Static Sitting - Balance Support: Feet supported Static Sitting - Level of Assistance: 3: Mod assist Dynamic Sitting Balance Dynamic Sitting - Balance Support: Feet supported;During functional activity Dynamic Sitting - Level of Assistance: 3: Mod assist Extremity/Trunk Assessment RUE Assessment RUE Assessment: Exceptions to [facility] General Strength Comments: B hand tremors, deconditioning, grossly 3+/5 LUE Assessment LUE Assessment: Exceptions to [facility] General Strength Comments: B hand tremors, deconditioning, grossly 3+/5  Skilled Intervention: Pt received resting in bed for skilled OT session with focus on BADL retraining at bed-level. Pt requiring tactile cuing to arose, requiring overall total A for oral care and full-body dressing. OT using suction toothbrush for decreased risk of aspiration due to arousal level. Pt remained resting in bed with all immediate needs met at end of session.   Maudie Mercury, OTR/L, MSOT  09/18/2022, 1:03 PM

## 2022-09-18 NOTE — Progress Notes (Signed)
Inpatient Rehabilitation Discharge Medication Review by a Pharmacist  A complete drug regimen review was completed for this patient to identify any potential clinically significant medication issues.  High Risk Drug Classes Is patient taking? Indication by Medication  Antipsychotic Yes Seroquel- sleep/agitation  Anticoagulant Yes Apixaban- AF  Antibiotic No   Opioid No   Antiplatelet No   Hypoglycemics/insulin No   Vasoactive Medication Yes Hydralazine- HTN  Chemotherapy No   Other No      Type of Medication Issue Identified Description of Issue Recommendation(s)  Drug Interaction(s) (clinically significant)     Duplicate Therapy     Allergy     No Medication Administration End Date     Incorrect Dose     Additional Drug Therapy Needed     Significant med changes from prior encounter (inform family/care partners about these prior to discharge).    Other       Clinically significant medication issues were identified that warrant physician communication and completion of prescribed/recommended actions by midnight of the next day:  No   Time spent performing this drug regimen review (minutes):  30   Shlomo Seres BS, PharmD, BCPS Clinical Pharmacist 09/18/2022 8:38 AM  Contact: [phone] after 3 PM  "Be curious, not judgmental..." -Jamal Maes

## 2022-09-18 NOTE — Progress Notes (Signed)
Physical Therapy Session Note  Patient Details  Name: Justin Potter MRN: [id number] Date of Birth: 1926-02-12  Today's Date: 09/18/2022 PT Missed Time: 18 Minutes Missed Time Reason: Other (Comment);Patient fatigue (Unable to remain awake for safe treatment session)  Attempted to see patient for scheduled PT treatment session prior to discharge to skilled nursing facility, however patient was unable remain alert/awake long enough to participate in treatment session safely and appropriately. Patient was able to state his name and that he is in a "facility for therapy" when prompted, however immediately fell back asleep. Therapist ensured patient had his call bell within reach and all needs met prior to leaving the room- Patient left supine in bed with bed alarm on.   [place] 09/18/2022, 10:44 AM

## 2022-09-18 NOTE — Progress Notes (Signed)
Pt was restless and confused most the shift.. Staff ensured safety by frequent checks.

## 2022-09-18 NOTE — Progress Notes (Signed)
Completed nursing education and nursing care plan for discharge to [facility].

## 2022-09-18 NOTE — Plan of Care (Signed)
  Problem: Sit to Stand Goal: LTG:  Patient will perform sit to stand in prep for activites of daily living with assistance level (OT) Description: LTG:  Patient will perform sit to stand in prep for activites of daily living with assistance level (OT) Outcome: Not Met (add Reason)   Problem: RH Vision Goal: RH LTG Vision (Specify) Outcome: Not Met (add Reason)   Pt did not meet above goals due to decreased ability to participate in OT sessions as a result of declining cognitive functioning.

## 2022-09-19 DIAGNOSIS — R601 Generalized edema: Secondary | ICD-10-CM | POA: Diagnosis not present

## 2022-09-19 DIAGNOSIS — I4821 Permanent atrial fibrillation: Secondary | ICD-10-CM | POA: Diagnosis not present

## 2022-09-19 DIAGNOSIS — F05 Delirium due to known physiological condition: Secondary | ICD-10-CM | POA: Diagnosis not present

## 2022-09-19 DIAGNOSIS — R54 Age-related physical debility: Secondary | ICD-10-CM | POA: Diagnosis not present

## 2022-10-23 DEATH — deceased

## 2022-11-03 ENCOUNTER — Ambulatory Visit: Payer: Medicare Other | Admitting: Cardiology
